# Patient Record
Sex: Male | Born: 1935
Health system: Southern US, Community
[De-identification: ages and names within clinical notes are randomized; demographics above are authoritative.]

## PROBLEM LIST (undated history)

## (undated) DIAGNOSIS — K317 Polyp of stomach and duodenum: Secondary | ICD-10-CM

## (undated) DIAGNOSIS — K5790 Diverticulosis of intestine, part unspecified, without perforation or abscess without bleeding: Secondary | ICD-10-CM

## (undated) DIAGNOSIS — H269 Unspecified cataract: Secondary | ICD-10-CM

## (undated) DIAGNOSIS — D509 Iron deficiency anemia, unspecified: Secondary | ICD-10-CM

## (undated) DIAGNOSIS — I10 Essential (primary) hypertension: Secondary | ICD-10-CM

## (undated) DIAGNOSIS — K649 Unspecified hemorrhoids: Secondary | ICD-10-CM

## (undated) DIAGNOSIS — I499 Cardiac arrhythmia, unspecified: Secondary | ICD-10-CM

## (undated) DIAGNOSIS — M1990 Unspecified osteoarthritis, unspecified site: Secondary | ICD-10-CM

## (undated) DIAGNOSIS — R600 Localized edema: Secondary | ICD-10-CM

## (undated) DIAGNOSIS — T8859XA Other complications of anesthesia, initial encounter: Secondary | ICD-10-CM

## (undated) DIAGNOSIS — K219 Gastro-esophageal reflux disease without esophagitis: Secondary | ICD-10-CM

## (undated) DIAGNOSIS — N400 Enlarged prostate without lower urinary tract symptoms: Secondary | ICD-10-CM

## (undated) DIAGNOSIS — E785 Hyperlipidemia, unspecified: Secondary | ICD-10-CM

## (undated) DIAGNOSIS — N200 Calculus of kidney: Secondary | ICD-10-CM

## (undated) DIAGNOSIS — Z972 Presence of dental prosthetic device (complete) (partial): Secondary | ICD-10-CM

## (undated) DIAGNOSIS — T4145XA Adverse effect of unspecified anesthetic, initial encounter: Secondary | ICD-10-CM

## (undated) DIAGNOSIS — Z87442 Personal history of urinary calculi: Secondary | ICD-10-CM

## (undated) DIAGNOSIS — Z973 Presence of spectacles and contact lenses: Secondary | ICD-10-CM

## (undated) DIAGNOSIS — K635 Polyp of colon: Secondary | ICD-10-CM

## (undated) DIAGNOSIS — R7303 Prediabetes: Secondary | ICD-10-CM

## (undated) HISTORY — DX: Polyp of colon: K63.5

## (undated) HISTORY — DX: Benign prostatic hyperplasia without lower urinary tract symptoms: N40.0

## (undated) HISTORY — PX: KIDNEY STONE SURGERY: SHX686

## (undated) HISTORY — PX: APPENDECTOMY: SHX54

## (undated) HISTORY — DX: Localized edema: R60.0

## (undated) HISTORY — PX: TONSILLECTOMY: SUR1361

## (undated) HISTORY — PX: CATARACT EXTRACTION: SUR2

## (undated) HISTORY — DX: Essential (primary) hypertension: I10

## (undated) HISTORY — DX: Hypercalcemia: E83.52

## (undated) HISTORY — DX: Hyperlipidemia, unspecified: E78.5

## (undated) HISTORY — DX: Unspecified cataract: H26.9

## (undated) HISTORY — PX: EYE SURGERY: SHX253

## (undated) HISTORY — PX: MULTIPLE TOOTH EXTRACTIONS: SHX2053

## (undated) HISTORY — DX: Calculus of kidney: N20.0

## (undated) HISTORY — DX: Iron deficiency anemia, unspecified: D50.9

## (undated) HISTORY — DX: Diverticulosis of intestine, part unspecified, without perforation or abscess without bleeding: K57.90

## (undated) HISTORY — DX: Gastro-esophageal reflux disease without esophagitis: K21.9

## (undated) HISTORY — DX: Unspecified hemorrhoids: K64.9

## (undated) HISTORY — DX: Polyp of stomach and duodenum: K31.7

---

## 2002-05-11 ENCOUNTER — Encounter: Admission: RE | Admit: 2002-05-11 | Discharge: 2002-05-11 | Payer: Self-pay | Admitting: Family Medicine

## 2002-05-11 ENCOUNTER — Encounter: Payer: Self-pay | Admitting: Family Medicine

## 2004-01-16 ENCOUNTER — Ambulatory Visit (HOSPITAL_COMMUNITY): Admission: RE | Admit: 2004-01-16 | Discharge: 2004-01-16 | Payer: Self-pay | Admitting: Urology

## 2004-01-16 ENCOUNTER — Ambulatory Visit (HOSPITAL_BASED_OUTPATIENT_CLINIC_OR_DEPARTMENT_OTHER): Admission: RE | Admit: 2004-01-16 | Discharge: 2004-01-16 | Payer: Self-pay | Admitting: Urology

## 2005-10-30 ENCOUNTER — Ambulatory Visit: Payer: Self-pay | Admitting: Family Medicine

## 2006-02-02 ENCOUNTER — Ambulatory Visit: Payer: Self-pay | Admitting: Family Medicine

## 2006-02-02 LAB — CONVERTED CEMR LAB
CO2: 30 meq/L (ref 19–32)
Chloride: 109 meq/L (ref 96–112)
Cholesterol: 252 mg/dL (ref 0–200)
Creatinine, Ser: 1.2 mg/dL (ref 0.4–1.5)
Glucose, Bld: 109 mg/dL — ABNORMAL HIGH (ref 70–99)
Sodium: 143 meq/L (ref 135–145)

## 2006-02-13 ENCOUNTER — Ambulatory Visit: Payer: Self-pay | Admitting: Family Medicine

## 2006-02-13 LAB — CONVERTED CEMR LAB: AST: 19 units/L (ref 0–37)

## 2006-03-04 ENCOUNTER — Ambulatory Visit: Payer: Self-pay | Admitting: Gastroenterology

## 2006-03-16 ENCOUNTER — Ambulatory Visit: Payer: Self-pay | Admitting: Gastroenterology

## 2006-03-16 ENCOUNTER — Encounter (INDEPENDENT_AMBULATORY_CARE_PROVIDER_SITE_OTHER): Payer: Self-pay | Admitting: Specialist

## 2006-05-05 ENCOUNTER — Ambulatory Visit: Payer: Self-pay | Admitting: Family Medicine

## 2006-05-05 LAB — CONVERTED CEMR LAB
AST: 19 units/L (ref 0–37)
Cholesterol: 170 mg/dL (ref 0–200)
Total CHOL/HDL Ratio: 4.2
Triglycerides: 129 mg/dL (ref 0–149)

## 2006-11-11 ENCOUNTER — Ambulatory Visit: Payer: Self-pay | Admitting: Family Medicine

## 2006-11-11 DIAGNOSIS — K219 Gastro-esophageal reflux disease without esophagitis: Secondary | ICD-10-CM

## 2006-11-11 DIAGNOSIS — E785 Hyperlipidemia, unspecified: Secondary | ICD-10-CM

## 2006-11-11 DIAGNOSIS — I10 Essential (primary) hypertension: Secondary | ICD-10-CM | POA: Insufficient documentation

## 2006-11-16 ENCOUNTER — Telehealth (INDEPENDENT_AMBULATORY_CARE_PROVIDER_SITE_OTHER): Payer: Self-pay | Admitting: *Deleted

## 2006-11-16 DIAGNOSIS — E213 Hyperparathyroidism, unspecified: Secondary | ICD-10-CM | POA: Insufficient documentation

## 2006-11-16 LAB — CONVERTED CEMR LAB
ALT: 23 units/L (ref 0–53)
Calcium: 10.7 mg/dL — ABNORMAL HIGH (ref 8.4–10.5)
Chloride: 109 meq/L (ref 96–112)
Creatinine, Ser: 1.2 mg/dL (ref 0.4–1.5)
Glucose, Bld: 102 mg/dL — ABNORMAL HIGH (ref 70–99)
Sodium: 141 meq/L (ref 135–145)

## 2007-02-09 ENCOUNTER — Ambulatory Visit: Payer: Self-pay | Admitting: Family Medicine

## 2007-02-15 ENCOUNTER — Telehealth (INDEPENDENT_AMBULATORY_CARE_PROVIDER_SITE_OTHER): Payer: Self-pay | Admitting: *Deleted

## 2007-02-15 ENCOUNTER — Encounter: Admission: RE | Admit: 2007-02-15 | Discharge: 2007-02-15 | Payer: Self-pay | Admitting: Family Medicine

## 2007-02-15 ENCOUNTER — Encounter (INDEPENDENT_AMBULATORY_CARE_PROVIDER_SITE_OTHER): Payer: Self-pay | Admitting: Family Medicine

## 2007-02-15 DIAGNOSIS — N200 Calculus of kidney: Secondary | ICD-10-CM | POA: Insufficient documentation

## 2007-02-15 LAB — CONVERTED CEMR LAB
ALT: 32 units/L (ref 0–53)
Cholesterol: 168 mg/dL (ref 0–200)
Creatinine, Ser: 1.3 mg/dL (ref 0.4–1.5)
Glucose, Bld: 119 mg/dL — ABNORMAL HIGH (ref 70–99)
HDL: 37.5 mg/dL — ABNORMAL LOW (ref >39.0)
PSA: 8.09 ng/mL — ABNORMAL HIGH (ref 0.10–4.00)
Potassium: 4.4 meq/L (ref 3.5–5.1)
Sodium: 139 meq/L (ref 135–145)
Triglycerides: 149 mg/dL (ref 0–149)
VLDL: 30 mg/dL (ref 0–40)

## 2007-02-16 ENCOUNTER — Telehealth (INDEPENDENT_AMBULATORY_CARE_PROVIDER_SITE_OTHER): Payer: Self-pay | Admitting: Family Medicine

## 2007-02-24 ENCOUNTER — Ambulatory Visit: Payer: Self-pay | Admitting: Family Medicine

## 2007-02-25 ENCOUNTER — Telehealth (INDEPENDENT_AMBULATORY_CARE_PROVIDER_SITE_OTHER): Payer: Self-pay | Admitting: *Deleted

## 2007-03-02 ENCOUNTER — Ambulatory Visit: Payer: Self-pay | Admitting: Family Medicine

## 2007-03-02 DIAGNOSIS — E119 Type 2 diabetes mellitus without complications: Secondary | ICD-10-CM

## 2007-03-03 ENCOUNTER — Encounter (INDEPENDENT_AMBULATORY_CARE_PROVIDER_SITE_OTHER): Payer: Self-pay | Admitting: *Deleted

## 2007-03-17 ENCOUNTER — Encounter: Admission: RE | Admit: 2007-03-17 | Discharge: 2007-03-17 | Payer: Self-pay | Admitting: Family Medicine

## 2007-04-02 ENCOUNTER — Encounter (INDEPENDENT_AMBULATORY_CARE_PROVIDER_SITE_OTHER): Payer: Self-pay | Admitting: Family Medicine

## 2007-04-20 ENCOUNTER — Encounter (INDEPENDENT_AMBULATORY_CARE_PROVIDER_SITE_OTHER): Payer: Self-pay | Admitting: Family Medicine

## 2007-04-24 ENCOUNTER — Encounter (INDEPENDENT_AMBULATORY_CARE_PROVIDER_SITE_OTHER): Payer: Self-pay | Admitting: Family Medicine

## 2007-04-30 ENCOUNTER — Encounter (INDEPENDENT_AMBULATORY_CARE_PROVIDER_SITE_OTHER): Payer: Self-pay | Admitting: Family Medicine

## 2007-05-18 ENCOUNTER — Encounter (INDEPENDENT_AMBULATORY_CARE_PROVIDER_SITE_OTHER): Payer: Self-pay | Admitting: Family Medicine

## 2007-05-25 ENCOUNTER — Ambulatory Visit: Payer: Self-pay | Admitting: Internal Medicine

## 2007-05-25 DIAGNOSIS — F528 Other sexual dysfunction not due to a substance or known physiological condition: Secondary | ICD-10-CM | POA: Insufficient documentation

## 2007-05-26 LAB — CONVERTED CEMR LAB
CO2: 31 meq/L (ref 19–32)
Calcium: 10.6 mg/dL — ABNORMAL HIGH (ref 8.4–10.5)
Chloride: 108 meq/L (ref 96–112)
Glucose, Bld: 111 mg/dL — ABNORMAL HIGH (ref 70–99)

## 2007-05-28 ENCOUNTER — Telehealth (INDEPENDENT_AMBULATORY_CARE_PROVIDER_SITE_OTHER): Payer: Self-pay | Admitting: *Deleted

## 2007-07-13 ENCOUNTER — Ambulatory Visit: Payer: Self-pay | Admitting: Internal Medicine

## 2007-07-14 ENCOUNTER — Telehealth: Payer: Self-pay | Admitting: Internal Medicine

## 2007-07-16 ENCOUNTER — Telehealth (INDEPENDENT_AMBULATORY_CARE_PROVIDER_SITE_OTHER): Payer: Self-pay | Admitting: *Deleted

## 2007-08-05 ENCOUNTER — Ambulatory Visit: Payer: Self-pay | Admitting: Internal Medicine

## 2007-08-06 ENCOUNTER — Encounter (INDEPENDENT_AMBULATORY_CARE_PROVIDER_SITE_OTHER): Payer: Self-pay | Admitting: *Deleted

## 2007-08-09 ENCOUNTER — Encounter (INDEPENDENT_AMBULATORY_CARE_PROVIDER_SITE_OTHER): Payer: Self-pay | Admitting: *Deleted

## 2007-08-09 ENCOUNTER — Telehealth (INDEPENDENT_AMBULATORY_CARE_PROVIDER_SITE_OTHER): Payer: Self-pay | Admitting: *Deleted

## 2007-08-09 LAB — CONVERTED CEMR LAB
BUN: 20 mg/dL (ref 6–23)
CO2: 30 meq/L (ref 19–32)
Chloride: 101 meq/L (ref 96–112)
GFR calc non Af Amer: 70 mL/min
Glucose, Bld: 101 mg/dL — ABNORMAL HIGH (ref 70–99)
Potassium: 4.6 meq/L (ref 3.5–5.1)

## 2007-09-07 ENCOUNTER — Encounter: Payer: Self-pay | Admitting: Internal Medicine

## 2007-09-13 ENCOUNTER — Encounter: Admission: RE | Admit: 2007-09-13 | Discharge: 2007-09-13 | Payer: Self-pay | Admitting: Surgery

## 2007-09-29 ENCOUNTER — Encounter: Admission: RE | Admit: 2007-09-29 | Discharge: 2007-09-29 | Payer: Self-pay | Admitting: Surgery

## 2007-10-07 ENCOUNTER — Encounter: Payer: Self-pay | Admitting: Internal Medicine

## 2007-10-18 ENCOUNTER — Encounter: Payer: Self-pay | Admitting: Internal Medicine

## 2007-11-08 ENCOUNTER — Telehealth (INDEPENDENT_AMBULATORY_CARE_PROVIDER_SITE_OTHER): Payer: Self-pay | Admitting: *Deleted

## 2007-11-30 ENCOUNTER — Ambulatory Visit: Payer: Self-pay | Admitting: Internal Medicine

## 2007-12-06 ENCOUNTER — Encounter (INDEPENDENT_AMBULATORY_CARE_PROVIDER_SITE_OTHER): Payer: Self-pay | Admitting: *Deleted

## 2007-12-06 LAB — CONVERTED CEMR LAB
Calcium: 10 mg/dL (ref 8.4–10.5)
GFR calc Af Amer: 85 mL/min
Glucose, Bld: 98 mg/dL (ref 70–99)
HDL: 38.4 mg/dL — ABNORMAL LOW (ref >39.0)
Hemoglobin: 17.9 g/dL — ABNORMAL HIGH (ref 13.0–17.0)
Sodium: 141 meq/L (ref 135–145)
VLDL: 27 mg/dL (ref 0–40)

## 2008-01-20 ENCOUNTER — Encounter: Payer: Self-pay | Admitting: Internal Medicine

## 2008-02-25 ENCOUNTER — Telehealth (INDEPENDENT_AMBULATORY_CARE_PROVIDER_SITE_OTHER): Payer: Self-pay | Admitting: *Deleted

## 2008-03-20 ENCOUNTER — Telehealth (INDEPENDENT_AMBULATORY_CARE_PROVIDER_SITE_OTHER): Payer: Self-pay | Admitting: *Deleted

## 2008-04-10 HISTORY — PX: PARATHYROIDECTOMY: SHX19

## 2008-04-12 ENCOUNTER — Ambulatory Visit: Payer: Self-pay | Admitting: Internal Medicine

## 2008-04-12 DIAGNOSIS — R972 Elevated prostate specific antigen [PSA]: Secondary | ICD-10-CM | POA: Insufficient documentation

## 2008-04-13 ENCOUNTER — Encounter: Payer: Self-pay | Admitting: Internal Medicine

## 2008-04-19 ENCOUNTER — Telehealth (INDEPENDENT_AMBULATORY_CARE_PROVIDER_SITE_OTHER): Payer: Self-pay | Admitting: *Deleted

## 2008-04-20 ENCOUNTER — Ambulatory Visit: Payer: Self-pay | Admitting: Internal Medicine

## 2008-04-20 ENCOUNTER — Encounter: Payer: Self-pay | Admitting: Internal Medicine

## 2008-05-24 ENCOUNTER — Telehealth: Payer: Self-pay | Admitting: Internal Medicine

## 2008-05-30 ENCOUNTER — Telehealth (INDEPENDENT_AMBULATORY_CARE_PROVIDER_SITE_OTHER): Payer: Self-pay | Admitting: *Deleted

## 2008-06-02 ENCOUNTER — Ambulatory Visit (HOSPITAL_COMMUNITY): Admission: RE | Admit: 2008-06-02 | Discharge: 2008-06-03 | Payer: Self-pay | Admitting: Surgery

## 2008-06-02 ENCOUNTER — Encounter (INDEPENDENT_AMBULATORY_CARE_PROVIDER_SITE_OTHER): Payer: Self-pay | Admitting: Surgery

## 2008-06-16 ENCOUNTER — Telehealth (INDEPENDENT_AMBULATORY_CARE_PROVIDER_SITE_OTHER): Payer: Self-pay | Admitting: *Deleted

## 2008-06-26 ENCOUNTER — Telehealth (INDEPENDENT_AMBULATORY_CARE_PROVIDER_SITE_OTHER): Payer: Self-pay | Admitting: *Deleted

## 2008-06-27 ENCOUNTER — Encounter: Payer: Self-pay | Admitting: Internal Medicine

## 2008-07-13 ENCOUNTER — Encounter: Payer: Self-pay | Admitting: Internal Medicine

## 2008-08-22 ENCOUNTER — Encounter: Payer: Self-pay | Admitting: Internal Medicine

## 2008-10-10 ENCOUNTER — Ambulatory Visit: Payer: Self-pay | Admitting: Internal Medicine

## 2008-10-10 LAB — CONVERTED CEMR LAB: Vit D, 25-Hydroxy: 30 ng/mL (ref 30–89)

## 2008-10-11 ENCOUNTER — Telehealth: Payer: Self-pay | Admitting: Internal Medicine

## 2008-10-13 ENCOUNTER — Encounter (INDEPENDENT_AMBULATORY_CARE_PROVIDER_SITE_OTHER): Payer: Self-pay | Admitting: *Deleted

## 2008-10-13 LAB — CONVERTED CEMR LAB
ALT: 20 units/L (ref 0–53)
TSH: 1.44 microintl units/mL (ref 0.35–5.50)

## 2008-11-22 ENCOUNTER — Telehealth (INDEPENDENT_AMBULATORY_CARE_PROVIDER_SITE_OTHER): Payer: Self-pay | Admitting: *Deleted

## 2009-01-10 ENCOUNTER — Ambulatory Visit: Payer: Self-pay | Admitting: Internal Medicine

## 2009-01-13 ENCOUNTER — Encounter: Payer: Self-pay | Admitting: Internal Medicine

## 2009-01-15 LAB — CONVERTED CEMR LAB
Calcium: 8.6 mg/dL (ref 8.4–10.5)
Cholesterol: 156 mg/dL (ref 0–200)
Creatinine, Ser: 1.1 mg/dL (ref 0.4–1.5)
GFR calc non Af Amer: 69.86 mL/min (ref >60)
Sodium: 142 meq/L (ref 135–145)
Total CHOL/HDL Ratio: 4
Triglycerides: 100 mg/dL (ref 0.0–149.0)

## 2009-05-10 ENCOUNTER — Ambulatory Visit: Payer: Self-pay | Admitting: Internal Medicine

## 2009-08-07 ENCOUNTER — Telehealth (INDEPENDENT_AMBULATORY_CARE_PROVIDER_SITE_OTHER): Payer: Self-pay | Admitting: *Deleted

## 2009-08-20 ENCOUNTER — Telehealth (INDEPENDENT_AMBULATORY_CARE_PROVIDER_SITE_OTHER): Payer: Self-pay | Admitting: *Deleted

## 2009-09-19 ENCOUNTER — Ambulatory Visit: Payer: Self-pay | Admitting: Internal Medicine

## 2009-09-21 LAB — CONVERTED CEMR LAB
ALT: 19 units/L (ref 0–53)
AST: 19 units/L (ref 0–37)
Basophils Absolute: 0 10*3/uL (ref 0.0–0.1)
Chloride: 106 meq/L (ref 96–112)
Cholesterol: 160 mg/dL (ref 0–200)
Creatinine, Ser: 1.1 mg/dL (ref 0.4–1.5)
Creatinine,U: 112.1 mg/dL
Eosinophils Absolute: 0.2 10*3/uL (ref 0.0–0.7)
GFR calc non Af Amer: 66.91 mL/min (ref >60)
HDL: 39.8 mg/dL (ref >39.00)
Hemoglobin: 17 g/dL (ref 13.0–17.0)
Lymphocytes Relative: 30 % (ref 12.0–46.0)
MCHC: 34 g/dL (ref 30.0–36.0)
Microalb Creat Ratio: 3.1 mg/g (ref 0.0–30.0)
Microalb, Ur: 3.5 mg/dL — ABNORMAL HIGH (ref 0.0–1.9)
Monocytes Relative: 6.4 % (ref 3.0–12.0)
Neutro Abs: 4.3 10*3/uL (ref 1.4–7.7)
Neutrophils Relative %: 60.8 % (ref 43.0–77.0)
Platelets: 182 10*3/uL (ref 150.0–400.0)
Potassium: 4.1 meq/L (ref 3.5–5.1)
RDW: 14.2 % (ref 11.5–14.6)
Triglycerides: 131 mg/dL (ref 0.0–149.0)
VLDL: 26.2 mg/dL (ref 0.0–40.0)

## 2009-10-03 ENCOUNTER — Encounter: Payer: Self-pay | Admitting: Internal Medicine

## 2009-10-23 ENCOUNTER — Encounter (INDEPENDENT_AMBULATORY_CARE_PROVIDER_SITE_OTHER): Payer: Self-pay | Admitting: *Deleted

## 2009-11-05 ENCOUNTER — Telehealth (INDEPENDENT_AMBULATORY_CARE_PROVIDER_SITE_OTHER): Payer: Self-pay | Admitting: *Deleted

## 2010-01-23 ENCOUNTER — Encounter: Payer: Self-pay | Admitting: Internal Medicine

## 2010-03-22 ENCOUNTER — Ambulatory Visit
Admission: RE | Admit: 2010-03-22 | Discharge: 2010-03-22 | Payer: Self-pay | Source: Home / Self Care | Attending: Internal Medicine | Admitting: Internal Medicine

## 2010-03-22 ENCOUNTER — Other Ambulatory Visit: Payer: Self-pay | Admitting: Internal Medicine

## 2010-03-22 LAB — ALT: ALT: 20 U/L (ref 0–53)

## 2010-03-22 LAB — BASIC METABOLIC PANEL
BUN: 26 mg/dL — ABNORMAL HIGH (ref 6–23)
CO2: 30 mEq/L (ref 19–32)
Calcium: 8.8 mg/dL (ref 8.4–10.5)
Chloride: 107 mEq/L (ref 96–112)
Creatinine, Ser: 1.2 mg/dL (ref 0.4–1.5)
GFR: 62.37 mL/min (ref >60.00)
Glucose, Bld: 111 mg/dL — ABNORMAL HIGH (ref 70–99)
Potassium: 4.5 mEq/L (ref 3.5–5.1)
Sodium: 142 mEq/L (ref 135–145)

## 2010-03-22 LAB — AST: AST: 20 U/L (ref 0–37)

## 2010-03-22 LAB — HEMOGLOBIN A1C: Hgb A1c MFr Bld: 6.3 % (ref 4.6–6.5)

## 2010-04-07 LAB — CONVERTED CEMR LAB
BUN: 25 mg/dL — ABNORMAL HIGH (ref 6–23)
Basophils Absolute: 0 10*3/uL (ref 0.0–0.1)
Basophils Relative: 0 % (ref 0.0–3.0)
CO2: 29 meq/L (ref 19–32)
Calcium: 10 mg/dL (ref 8.4–10.5)
Chloride: 113 meq/L — ABNORMAL HIGH (ref 96–112)
Creatinine, Ser: 1.2 mg/dL (ref 0.4–1.5)
Creatinine, Ser: 1.4 mg/dL (ref 0.4–1.5)
Creatinine,U: 246.8 mg/dL
Eosinophils Absolute: 0.1 10*3/uL (ref 0.0–0.7)
GFR calc Af Amer: 77 mL/min
GFR calc non Af Amer: 63 mL/min
Glucose, Bld: 119 mg/dL — ABNORMAL HIGH (ref 70–99)
Hemoglobin: 16.7 g/dL (ref 13.0–17.0)
MCHC: 33.5 g/dL (ref 30.0–36.0)
MCV: 91.5 fL (ref 78.0–100.0)
Monocytes Absolute: 0.4 10*3/uL (ref 0.1–1.0)
Neutro Abs: 3.6 10*3/uL (ref 1.4–7.7)
Neutrophils Relative %: 60.5 % (ref 43.0–77.0)
RBC: 5.44 M/uL (ref 4.22–5.81)

## 2010-04-09 ENCOUNTER — Encounter: Payer: Self-pay | Admitting: Internal Medicine

## 2010-04-09 NOTE — Assessment & Plan Note (Signed)
Summary: 4 MONTH FOLLOWUP///SPH   Vital Signs:  Patient profile:   76 year old male Height:      68.5 inches Weight:      205.6 pounds BMI:     30.92 Pulse rate:   60 / minute Pulse rhythm:   regular BP sitting:   132 / 80  (left arm) Cuff size:   large  Vitals Entered By: Shary Decamp (May 10, 2009 8:29 AM) CC: rov - fasting   History of Present Illness: ROV Hyperlipidemia-- good medication compliance    AODM -- life style unchanged , no CBGs     Allergies: 1)  ! Sulfa  Past History:  Past Medical History: GERD Hyperlipidemia Hypertension AODM  h/o CALCULUS OF KIDNEY s/p R  percutaneus nephrostomy @ [facility] Hx of HYPERCALCEMIA ----> Parathyroidectomy (04/2008) Benign prostatic hypertrophy, f/u @ [facility], s/p Bx 2009 aprox: (-)   Past Surgical History: Reviewed history from 10/10/2008 and no changes required. Appendectomy eye surgery Tonsillectomy Parathyroidectomy (04/2008)  Social History: Reviewed history from 10/10/2008 and no changes required. Retired Single, has  a girlfriend  3 kids  Former Smoker Alcohol use-yes Drug use-no Regular exercise-no  Review of Systems CV:  Denies chest pain or discomfort and shortness of breath with exertion. Resp:  Denies cough. GI:  GERD well controlled .  Physical Exam  General:  alert and well-developed.   Neck:  no masses, normal carotid upstroke, and no carotid bruits.   Lungs:  normal respiratory effort, no intercostal retractions, no accessory muscle use, and normal breath sounds.   Heart:  normal rate, regular rhythm, and no murmur.   Extremities:  no pretibial edema bilaterally      Impression & Recommendations:  Problem # 1:  DM (ICD-250.00) well-controlled on diet His updated medication list for this problem includes:    Benazepril Hcl 20 Mg Tabs (Benazepril hcl) .Marland Kitchen... 1 by mouth qd  Labs Reviewed: Creat: 1.1 (01/10/2009)    Reviewed HgBA1c results: 5.9 (01/10/2009)  5.8  (04/12/2008)  Problem # 2:  HYPERTENSION (ICD-401.9) at goal , no change  His updated medication list for this problem includes:    Toprol Xl 50 Mg Tb24 (Metoprolol succinate) .Marland Kitchen... 1 by mouth once daily    Procardia Xl 60 Mg Tb24 (Nifedipine) .Marland Kitchen... 1 by mouth once daily    Benazepril Hcl 20 Mg Tabs (Benazepril hcl) .Marland Kitchen... 1 by mouth qd  BP today: 132/80 Prior BP: 136/76 (01/10/2009)  Labs Reviewed: K+: 4.1 (01/10/2009) Creat: : 1.1 (01/10/2009)   Chol: 156 (01/10/2009)   HDL: 38.20 (01/10/2009)   LDL: 98 (01/10/2009)   TG: 100.0 (01/10/2009)  Problem # 3:  HYPERLIPIDEMIA (ICD-272.4) at goal  His updated medication list for this problem includes:    Zocor 40 Mg Tabs (Simvastatin) .Marland Kitchen... 1 by mouth once daily  Labs Reviewed: SGOT: 20 (10/10/2008)   SGPT: 20 (10/10/2008)   HDL:38.20 (01/10/2009), 38.4 (11/30/2007)  LDL:98 (01/10/2009), 98 (11/30/2007)  Chol:156 (01/10/2009), 164 (11/30/2007)  Trig:100.0 (01/10/2009), 136 (11/30/2007)  Complete Medication List: 1)  Gnp Saw Palmetto 160 Mg Caps (Saw palmetto (serenoa repens)) 2)  Prilosec 20 Mg Cpdr (Omeprazole) .Marland Kitchen.. 1 by mouth qd 3)  Toprol Xl 50 Mg Tb24 (Metoprolol succinate) .Marland Kitchen.. 1 by mouth once daily 4)  Procardia Xl 60 Mg Tb24 (Nifedipine) .Marland Kitchen.. 1 by mouth once daily 5)  Zocor 40 Mg Tabs (Simvastatin) .Marland Kitchen.. 1 by mouth once daily 6)  Benazepril Hcl 20 Mg Tabs (Benazepril hcl) .Marland Kitchen.. 1 by mouth qd  7)  Vitamin D 1000 Units  .... Once daily  Patient Instructions: 1)  Please schedule a follow-up appointment in 3 to 4  months .

## 2010-04-09 NOTE — Assessment & Plan Note (Signed)
Summary: 4 mth fu/kdc   Vital Signs:  Patient profile:   76 year old male Weight:      203.38 pounds Pulse rate:   72 / minute Pulse rhythm:   regular BP sitting:   126 / 82  (left arm) Cuff size:   large  Vitals Entered By: Army Fossa CMA (September 19, 2009 8:04 AM) CC: Pt here for 4 month f/u : Fasting.   History of Present Illness: routine office visit  Hyperlipidemia--good medication compliance, eats ad libitum, does not exercise and  think he won't change that  Hypertension--no ambulatory BPs  AODM --has not seen an eye doctor in 3 years   Benign prostatic hypertrophy, symptoms at baseline, saw urology May 2011  Allergies: 1)  ! Sulfa  Past History:  Past Medical History: Reviewed history from 05/10/2009 and no changes required. GERD Hyperlipidemia Hypertension AODM  h/o CALCULUS OF KIDNEY s/p R  percutaneus nephrostomy @ [facility] Hx of HYPERCALCEMIA ----> Parathyroidectomy (04/2008) Benign prostatic hypertrophy, f/u @ [facility], s/p Bx 2009 aprox: (-)   Past Surgical History: Reviewed history from 10/10/2008 and no changes required. Appendectomy eye surgery Tonsillectomy Parathyroidectomy (04/2008)  Family History: Reviewed history from 04/12/2008 and no changes required. MI-- F (age 43s), M colon ca--no prostate ca--no DM--no  Social History: Reviewed history from 10/10/2008 and no changes required. Retired Single, has  a girlfriend  3 kids  Former Smoker Alcohol use-yes Drug use-no Regular exercise-no  Review of Systems CV:  Denies chest pain or discomfort and swelling of feet. Resp:  Denies cough and shortness of breath. GI:  Denies bloody stools, nausea, and vomiting. GU:  denies dysuria, gross hematuria and as usual, his stream is weak which is essentially the only symptom he has as far as BPH. MS:  no lower extremity paresthesias Occasionally has pain at the proximal right heel. advil helps. Endo:   Marland Kitchen  Physical Exam  General:   alert, well-developed, and well-nourished.   Lungs:  normal respiratory effort, no intercostal retractions, no accessory muscle use, and normal breath sounds.   Heart:  normal rate, regular rhythm, and no murmur.   Pulses:  normal pedal pulses bilaterally Extremities:  no pretibial edema bilaterally     Diabetes Management Exam:    Foot Exam (with socks and/or shoes not present):       Sensory-Pinprick/Light touch:          Left medial foot (L-4): normal          Left dorsal foot (L-5): normal          Left lateral foot (S-1): normal          Right medial foot (L-4): normal          Right dorsal foot (L-5): normal          Right lateral foot (S-1): normal       Sensory-Monofilament:          Left foot: normal          Right foot: normal       Inspection:          Left foot: normal          Right foot: normal       Nails:          Left foot: normal          Right foot: normal   Impression & Recommendations:  Problem # 1:  HYPERLIPIDEMIA (ICD-272.4) due for labs His updated  medication list for this problem includes:    Zocor 40 Mg Tabs (Simvastatin) .Marland Kitchen... 1 by mouth once daily  Labs Reviewed: SGOT: 20 (10/10/2008)   SGPT: 20 (10/10/2008)   HDL:38.20 (01/10/2009), 38.4 (11/30/2007)  LDL:98 (01/10/2009), 98 (11/30/2007)  Chol:156 (01/10/2009), 164 (11/30/2007)  Trig:100.0 (01/10/2009), 136 (11/30/2007)  Orders: TLB-ALT (SGPT) (84460-ALT) TLB-AST (SGOT) (84450-SGOT) TLB-Lipid Panel (80061-LIPID)  Problem # 2:  DM (ICD-250.00) on no  medication does not follow any particular diet Does not exercise and thinks he won't do it in the future Nevertheless I counseled him about diet and exercise also recommend a yearly eye  checkup. He is due. His updated medication list for this problem includes:    Benazepril Hcl 20 Mg Tabs (Benazepril hcl) .Marland Kitchen... 1 by mouth qd    Labs Reviewed: Creat: 1.1 (01/10/2009)    Reviewed HgBA1c results: 5.9 (01/10/2009)  5.8  (04/12/2008)  Orders: TLB-A1C / Hgb A1C (Glycohemoglobin) (83036-A1C) TLB-Microalbumin/Creat Ratio, Urine (82043-MALB)  Problem # 3:  HYPERTENSION (ICD-401.9) at goal  His updated medication list for this problem includes:    Toprol Xl 50 Mg Tb24 (Metoprolol succinate) .Marland Kitchen... 1 by mouth once daily    Procardia Xl 60 Mg Tb24 (Nifedipine) .Marland Kitchen... 1 by mouth once daily    Benazepril Hcl 20 Mg Tabs (Benazepril hcl) .Marland Kitchen... 1 by mouth qd  BP today: 126/82 Prior BP: 132/80 (05/10/2009)  Labs Reviewed: K+: 4.1 (01/10/2009) Creat: : 1.1 (01/10/2009)   Chol: 156 (01/10/2009)   HDL: 38.20 (01/10/2009)   LDL: 98 (01/10/2009)   TG: 100.0 (01/10/2009)  Orders: Venipuncture (16109) TLB-BMP (Basic Metabolic Panel-BMET) (80048-METABOL) TLB-CBC Platelet - w/Differential (85025-CBCD)  Problem # 4:  PREVENTIVE HEALTH CARE (ICD-V70.0)   shingles shot today    Problem # 5:  BENIGN PROSTATIC HYPERTROPHY (ICD-600.00) recently seen by urology, no reports, patient will call them and ask for it   Complete Medication List: 1)  Gnp Saw Palmetto 160 Mg Caps (Saw palmetto (serenoa repens)) 2)  Prilosec 20 Mg Cpdr (Omeprazole) .Marland Kitchen.. 1 by mouth qd 3)  Toprol Xl 50 Mg Tb24 (Metoprolol succinate) .Marland Kitchen.. 1 by mouth once daily 4)  Procardia Xl 60 Mg Tb24 (Nifedipine) .Marland Kitchen.. 1 by mouth once daily 5)  Zocor 40 Mg Tabs (Simvastatin) .Marland Kitchen.. 1 by mouth once daily 6)  Benazepril Hcl 20 Mg Tabs (Benazepril hcl) .Marland Kitchen.. 1 by mouth qd 7)  Vitamin D 1000 Units  .... Once daily  Other Orders: Zoster (Shingles) Vaccine Live [phone]) Admin 1st Vaccine (09811)  Patient Instructions: 1)  Please schedule a follow-up appointment in 6 months .  2)  please call by November for your flu shot   Immunizations Administered:  Zostavax # 1:    Vaccine Type: Zostavax    Site: left deltoid    Mfr: Merck    Dose: 0.5 ml    Route: IM    Given by: Army Fossa CMA    Exp. Date: 10/03/2010    Lot #: [id number] was given Subcutaneously  not IM. Army Fossa CMA  September 19, 2009 8:46 AM

## 2010-04-09 NOTE — Miscellaneous (Signed)
   Clinical Lists Changes  Observations: Added new observation of EYE EXAM: Neg DM exam (10/23/2009 8:05)

## 2010-04-09 NOTE — Progress Notes (Signed)
Summary: refill   Phone Note Refill Request Message from:  Fax from Pharmacy on November 05, 2009 11:17 AM  Refills Requested: Medication #1:  ZOCOR 40 MG TABS 1 by mouth once daily [facility] - fax [phone]  Initial call taken by: Okey Regal [place],  November 05, 2009 11:18 AM    Prescriptions: ZOCOR 40 MG TABS (SIMVASTATIN) 1 by mouth once daily  #90 Tablet x 1   Entered by:   Army Fossa CMA   Authorized by:   Nolon Rod. Paz MD   Signed by:   Army Fossa CMA on 11/05/2009   Method used:   Electronically to        [facility]* (retail)             ,          Ph: [phone]       Fax: [phone]   RxID:   [id number]

## 2010-04-09 NOTE — Progress Notes (Signed)
Summary: Refill Request  Phone Note Refill Request Call back at [phone] Message from:  Pharmacy on Aug 07, 2009 9:25 AM  Refills Requested: Medication #1:  ZOCOR 40 MG TABS 1 by mouth once daily   Dosage confirmed as above?Dosage Confirmed   Brand Name Necessary? No   Supply Requested: 3 months [facility]  Next Appointment Scheduled: 7.13.11 Initial call taken by: Harold Barban,  Aug 07, 2009 9:25 AM    Prescriptions: ZOCOR 40 MG TABS (SIMVASTATIN) 1 by mouth once daily  #90 Tablet x 0   Entered by:   Shary Decamp   Authorized by:   Nolon Rod. Paz MD   Signed by:   Shary Decamp on 08/07/2009   Method used:   Electronically to        [facility]* (mail-order)             ,          Ph: [phone]       Fax: [phone]   RxID:   [id number]

## 2010-04-09 NOTE — Letter (Signed)
Summary: ney DM eye exam---- [facility]   Imported By: Lanelle Bal 10/18/2009 11:23:08  _____________________________________________________________________  External Attachment:    Type:   Image     Comment:   External Document  Appended Document: ney DM eye exam---- [facility] please enter in EMR

## 2010-04-09 NOTE — Letter (Signed)
Summary: [facility]  [facility]   Imported By: Lanelle Bal 02/04/2010 13:58:44  _____________________________________________________________________  External Attachment:    Type:   Image     Comment:   External Document

## 2010-04-09 NOTE — Progress Notes (Signed)
Summary: Refill Request  Phone Note Refill Request Call back at [phone] Message from:  Pharmacy on August 20, 2009 9:39 AM  Refills Requested: Medication #1:  BENAZEPRIL HCL 20 MG  TABS 1 by mouth qd   Dosage confirmed as above?Dosage Confirmed   Supply Requested: 3 months [facility]  Next Appointment Scheduled: July 13th Initial call taken by: Harold Barban,  August 20, 2009 9:40 AM    Prescriptions: BENAZEPRIL HCL 20 MG  TABS (BENAZEPRIL HCL) 1 by mouth qd  #90 x 0   Entered by:   Jeremy Johann CMA   Authorized by:   Nolon Rod. Paz MD   Signed by:   Jeremy Johann CMA on 08/20/2009   Method used:   Faxed to ...       [facility]* (retail)             ,          Ph: [phone]       Fax: [phone]   RxID:   [id number]

## 2010-04-11 NOTE — Assessment & Plan Note (Signed)
Summary: rto 6 months/cbs   Vital Signs:  Patient profile:   76 year old male Height:      68.5 inches Weight:      208.25 pounds BMI:     31.32 Pulse rate:   82 / minute Pulse rhythm:   regular BP sitting:   136 / 86  (left arm) Cuff size:   large  Vitals Entered By: Army Fossa CMA (March 22, 2010 8:26 AM) CC: 6 month f/u- fasting  Comments discuss simvastatin [facility]   History of Present Illness: ROV doing  well needs a  urologist in town, previously he saw  Dr. Earlene Plater @ [facility]  ROS AODM -- had  neg eye exam   Denies chest pain, shortness of breath No nausea, vomiting, diarrhea  Current Medications (verified): 1)  Gnp Saw Palmetto 160 Mg  Caps (Saw Palmetto (Serenoa Repens)) 2)  Prilosec 20 Mg  Cpdr (Omeprazole) .Marland Kitchen.. 1 By Mouth Qd 3)  Toprol Xl 50 Mg  Tb24 (Metoprolol Succinate) .Marland Kitchen.. 1 By Mouth Once Daily 4)  Procardia Xl 60 Mg  Tb24 (Nifedipine) .Marland Kitchen.. 1 By Mouth Once Daily 5)  Zocor 40 Mg Tabs (Simvastatin) .Marland Kitchen.. 1 By Mouth Once Daily 6)  Benazepril Hcl 20 Mg  Tabs (Benazepril Hcl) .Marland Kitchen.. 1 By Mouth Qd 7)  Vitamin D 1000 Units .... Once Daily  Allergies (verified): 1)  ! Sulfa  Past History:  Past Medical History: Reviewed history from 05/10/2009 and no changes required. GERD Hyperlipidemia Hypertension AODM  h/o CALCULUS OF KIDNEY s/p R  percutaneus nephrostomy @ [facility] Hx of HYPERCALCEMIA ----> Parathyroidectomy (04/2008) Benign prostatic hypertrophy, f/u @ [facility], s/p Bx 2009 aprox: (-)   Past Surgical History: Reviewed history from 10/10/2008 and no changes required. Appendectomy eye surgery Tonsillectomy Parathyroidectomy (04/2008)  Social History: Reviewed history from 10/10/2008 and no changes required. Retired Single, has  a girlfriend  3 kids  Former Smoker Alcohol use-yes Drug use-no Regular exercise-no  Physical Exam  General:  alert, well-developed, and well-nourished.   Lungs:  normal respiratory effort, no intercostal  retractions, no accessory muscle use, and normal breath sounds.   Heart:  normal rate, regular rhythm, and no murmur.   Extremities:  no pretibial edema bilaterally      Impression & Recommendations:  Problem # 1:  BENIGN PROSTATIC HYPERTROPHY (ICD-600.00) history of BPH, history of renal calculi. Would like a local urologist ---- referral arranged  Orders: Urology Referral (Urology)  Problem # 2:  DM (ICD-250.00) on diet only, labs  His updated medication list for this problem includes:    Benazepril Hcl 20 Mg Tabs (Benazepril hcl) .Marland Kitchen... 1 by mouth qd  Orders: TLB-A1C / Hgb A1C (Glycohemoglobin) (83036-A1C) Specimen Handling (60454)  Labs Reviewed: Creat: 1.1 (09/19/2009)    Reviewed HgBA1c results: 6.1 (09/19/2009)  5.9 (01/10/2009)  Problem # 3:  HYPERTENSION (ICD-401.9) no change for now. His updated medication list for this problem includes:    Toprol Xl 50 Mg Tb24 (Metoprolol succinate) .Marland Kitchen... 1 by mouth once daily    Procardia Xl 60 Mg Tb24 (Nifedipine) .Marland Kitchen... 1 by mouth once daily    Benazepril Hcl 20 Mg Tabs (Benazepril hcl) .Marland Kitchen... 1 by mouth qd  Orders: Venipuncture (09811) TLB-BMP (Basic Metabolic Panel-BMET) (80048-METABOL) TLB-ALT (SGPT) (84460-ALT) TLB-AST (SGOT) (84450-SGOT) Specimen Handling (91478)  BP today: 136/86 Prior BP: 126/82 (09/19/2009)  Labs Reviewed: K+: 4.1 (09/19/2009) Creat: : 1.1 (09/19/2009)   Chol: 160 (09/19/2009)   HDL: 39.80 (09/19/2009)   LDL: 94 (09/19/2009)  TG: 131.0 (09/19/2009)  Problem # 4:  HYPERLIPIDEMIA (ICD-272.4)  His updated medication list for this problem includes:    Zocor 40 Mg Tabs (Simvastatin) .Marland Kitchen... 1 by mouth once daily  Labs Reviewed: SGOT: 19 (09/19/2009)   SGPT: 19 (09/19/2009)   HDL:39.80 (09/19/2009), 38.20 (01/10/2009)  LDL:94 (09/19/2009), 98 (01/10/2009)  Chol:160 (09/19/2009), 156 (01/10/2009)  Trig:131.0 (09/19/2009), 100.0 (01/10/2009)  Complete Medication List: 1)  Gnp Saw Palmetto 160 Mg  Caps (Saw palmetto (serenoa repens)) 2)  Prilosec 20 Mg Cpdr (Omeprazole) .Marland Kitchen.. 1 by mouth qd 3)  Toprol Xl 50 Mg Tb24 (Metoprolol succinate) .Marland Kitchen.. 1 by mouth once daily 4)  Procardia Xl 60 Mg Tb24 (Nifedipine) .Marland Kitchen.. 1 by mouth once daily 5)  Zocor 40 Mg Tabs (Simvastatin) .Marland Kitchen.. 1 by mouth once daily 6)  Benazepril Hcl 20 Mg Tabs (Benazepril hcl) .Marland Kitchen.. 1 by mouth qd 7)  Vitamin D 1000 Units  .... Once daily  Patient Instructions: 1)  Please schedule a follow-up appointment in 6 months, fasting , physical exam    Orders Added: 1)  Venipuncture [36415] 2)  TLB-A1C / Hgb A1C (Glycohemoglobin) [83036-A1C] 3)  TLB-BMP (Basic Metabolic Panel-BMET) [80048-METABOL] 4)  TLB-ALT (SGPT) [84460-ALT] 5)  TLB-AST (SGOT) [84450-SGOT] 6)  Specimen Handling [99000] 7)  Urology Referral [Urology] 8)  Est. Patient Level III [16109]   Immunization History:  Influenza Immunization History:    Influenza:  benefits explained, declined (03/22/2010)  Pneumovax Immunization History:    Pneumovax:  benefits explained, declined  (03/22/2010)   Immunization History:  Influenza Immunization History:    Influenza:  benefits explained, declined (03/22/2010)  Pneumovax Immunization History:    Pneumovax:  benefits explained, declined  (03/22/2010)

## 2010-05-01 NOTE — Consult Note (Signed)
Summary: [facility]   Imported By: Maryln Gottron 04/16/2010 15:22:55  _____________________________________________________________________  External Attachment:    Type:   Image     Comment:   External Document

## 2010-06-20 LAB — COMPREHENSIVE METABOLIC PANEL
AST: 24 U/L (ref 0–37)
Albumin: 4 g/dL (ref 3.5–5.2)
BUN: 23 mg/dL (ref 6–23)
Calcium: 10.8 mg/dL — ABNORMAL HIGH (ref 8.4–10.5)
Chloride: 108 mEq/L (ref 96–112)
Creatinine, Ser: 1.14 mg/dL (ref 0.4–1.5)
GFR calc Af Amer: >60 mL/min (ref >60)
Total Bilirubin: 0.8 mg/dL (ref 0.3–1.2)

## 2010-06-20 LAB — GLUCOSE, CAPILLARY: Glucose-Capillary: 119 mg/dL — ABNORMAL HIGH (ref 70–99)

## 2010-06-20 LAB — DIFFERENTIAL
Basophils Absolute: 0 10*3/uL (ref 0.0–0.1)
Eosinophils Relative: 2 % (ref 0–5)
Lymphocytes Relative: 35 % (ref 12–46)
Lymphs Abs: 2.2 10*3/uL (ref 0.7–4.0)
Monocytes Absolute: 0.5 10*3/uL (ref 0.1–1.0)
Neutro Abs: 3.5 10*3/uL (ref 1.7–7.7)

## 2010-06-20 LAB — CALCIUM: Calcium: 9.2 mg/dL (ref 8.4–10.5)

## 2010-06-20 LAB — CBC
HCT: 52.4 % — ABNORMAL HIGH (ref 39.0–52.0)
MCHC: 34 g/dL (ref 30.0–36.0)
MCV: 91.4 fL (ref 78.0–100.0)
Platelets: 161 10*3/uL (ref 150–400)
RDW: 13.1 % (ref 11.5–15.5)

## 2010-06-20 LAB — URINALYSIS, ROUTINE W REFLEX MICROSCOPIC
Glucose, UA: NEGATIVE mg/dL
Protein, ur: NEGATIVE mg/dL
Specific Gravity, Urine: 1.018 (ref 1.005–1.030)
Urobilinogen, UA: 1 mg/dL (ref 0.0–1.0)

## 2010-06-20 LAB — PROTIME-INR: Prothrombin Time: 13.8 seconds (ref 11.6–15.2)

## 2010-07-21 ENCOUNTER — Other Ambulatory Visit: Payer: Self-pay | Admitting: Internal Medicine

## 2010-07-23 NOTE — Op Note (Signed)
NAME:  Ronald [name], Ronald [name] NO.:  [id number]   MEDICAL RECORD NO.:  [id number]          PATIENT TYPE:  AMB   LOCATION:  SDS                          FACILITY:  [facility]   PHYSICIAN:  Velora Heckler, MD      DATE OF BIRTH:  [birth year]   DATE OF PROCEDURE:  06/02/2008  DATE OF DISCHARGE:                               OPERATIVE REPORT   PREOPERATIVE DIAGNOSIS:  Primary hyperparathyroidism.   POSTOPERATIVE DIAGNOSIS:  Primary hyperparathyroidism.   PROCEDURE:  Neck exploration with left inferior parathyroidectomy.   SURGEON:  Velora Heckler, MD, FACS   ANESTHESIA:  General per Dr. Arta Bruce.   ESTIMATED BLOOD LOSS:  Minimal preparation Betadine.   COMPLICATIONS:  None.   INDICATIONS:  The patient is a 76 year old white male initially  evaluated in the summer of 2009.  He had biochemical evidence of primary  hyperparathyroidism with an elevated parathyroid hormone level of 135.5.  Sestamibi scan failed to reveal a parathyroid adenoma.  MRI scan  suggested a 12-mm enhancing lesion in the right inferior position  consistent with parathyroid adenoma.  The patient now comes to surgery  for exploration.   BODY OF REPORT:  Procedure was done in OR #17 at the [facility].  The patient was brought to the operating room and  placed in supine position on the operating room table.  Following  administration of general anesthesia, the patient was prepped and draped  in the usual strict aseptic fashion.  After ascertaining that an  adequate level of anesthesia had been achieved, a inferior right neck  incision was made with a #15 blade.  Dissection was carried down through  subcutaneous tissues and platysma.  Skin flaps were developed  circumferentially and a Weitlaner retractor placed for exposure.  Strap  muscles were incised in the midline.  Strap muscles were reflected to  the right.  The inferior pole of the right thyroid lobe was exposed.  Dissection  in the region just beneath the right lobe of the thyroid was  revealing of a normal-sized parathyroid gland.  It has a slight nodular  density.  It was mobilized and its vascular pedicle divided.  It was  excised and placed in saline.  Further exploration along the  tracheoesophageal groove fails to reveal any abnormally enlarged  parathyroid tissue.  However, there was adipose tissue that appears  somewhat suspect in color.  This appears to be in the thyrothymic tract.  It was excised.  It was submitted to pathology and Dr. Charlott Rakes  confirms parathyroid tissue intermixed with significant amount of  adipose tissue.  Subsequently, the previously excised parathyroid gland  was submitted.  Dr. Debby Bud confirms parathyroid tissue but does not feel  like this represents an adenoma.  Incision was slightly extended and the  right superior neck was explored.  No evidence of parathyroid adenoma  was identified.   At this point, a decision was made to proceed with 4 gland exploration.  Therefore, the incision was extended across the midline and subplatysmal  flaps were developed from  the thyroid notch to the sternal notch.  A  Mahorner self-retaining retractor was placed for exposure.  Strap  muscles were incised widely in the midline.  Further dissection in the  right neck fails to reveal parathyroid adenoma.  No further parathyroid  tissue was identified on the right.   We turned our attention to the left thyroid lobe.  This was gently  mobilized.  Middle thyroid vein was divided between Ligaclips.  Exploration reveals a normal superior parathyroid gland just above the  tubercle of Zuckerkandl.  This was left in situ.  Further dissection in  the inferior position at the beginning of the thyrothymic tract  identifies an enlarged parathyroid gland consistent with parathyroid  adenoma.  It measures approximately 1.2 cm in dimension.  It was  mobilized and vascular pedicle divided between  Ligaclips.  It was  excised in its entirety and submitted to pathology.  Dr. Charlott Rakes  confirms on frozen section parathyroid tissue consistent with  parathyroid adenoma.   Good hemostasis was obtained bilaterally.  Surgicel was placed in the  operative field bilaterally.  Strap muscles were reapproximated midline  with interrupted 3-0 Vicryl sutures.  Platysma was closed with  interrupted 3-0 Vicryl sutures.  Skin was closed with a running 4-0  Monocryl subcuticular suture.  Wound was washed and dried and Steri-  Strips were applied.  Sterile dressings were applied.  The patient was  awakened from anesthesia and brought to the recovery room in stable  condition.  The patient tolerated the procedure well.      Velora Heckler, MD  Electronically Signed     TMG/MEDQ  D:  06/02/2008  T:  06/02/2008  Job:  [id number]   cc:   Willow Ora, MD

## 2010-07-26 NOTE — Op Note (Signed)
NAME:  Ronald [name], Ronald [name] NO.:  [id number]   MEDICAL RECORD NO.:  [id number]          PATIENT TYPE:  AMB   LOCATION:  [facility]                         FACILITY:  [facility]   PHYSICIAN:  [place] Service, M.D.DATE OF BIRTH:  11/30/1934   DATE OF PROCEDURE:  01/16/2004  DATE OF DISCHARGE:                                 OPERATIVE REPORT   References made to office notes from January 09, 2004, December 04, 2003,  November 30, 2003, October 30, 2003, as well as CT scan from December 04, 2003.   PREOPERATIVE DIAGNOSIS:  Persistent hematuria, question of a 6-mm calculus,  left distal ureter. 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   PROCEDURE:  Cystoscopy, retrograde, right and left ureteroscopy.   ANESTHESIA:  General.   DRAINS:  None.   COMPLICATIONS:  None.   DESCRIPTION OF PROCEDURE:  The patient was prepped and draped in the dorsal  lithotomy position after institution of adequate level of general  anesthesia. Well lubricated 21-French [place] endoscope was gently inserted at  the urethral meatus. Normal urethra and sphincter. Elongated hyperemic  prostatic mucosa with multiple submucosa calculi. Bladder showed mild  distortion of the trigone consistent with an enlarged prostate. Bladder was  mildly trabeculated. Careful inspection with the 12 degree and 70 degree  lenses showed no other obvious evidence of intravesical pathology. Right and  left retrogrades were performed. Some difficulty was encountered in  negotiating the J deformity of the distal ureter. Floppy tip guide wire was  helpful in both cases. Retrogrades were performed due to use of Floppy tip  guide wire. There were some air bubbles with both retrograde films,  difficult to be certain whether small ureteral calculus was present on  either side. Proximal retrogrades, however, showed no evidence of filling  defect or obstruction. Guide wire was inserted at  the right ureteral  orifice, coiled nicely in the upper pole calyces. Short 6-French  ureteroscope was inserted along side the guide wire. Distal ureter inspected  to the limit of the short 6-French scope and then withdrawn. Similar  technique was used on the left side. No evidence of stone,  filling defect, or anatomic deformity was identified. Bladder was drained. A  20-French Foley was inserted to be left in for about one hour and then  removed when patient is fully alert and awake. The patient was given a B&O  suppository and returned to recovery in satisfactory condition.      RH/MEDQ  D:  01/16/2004  T:  01/16/2004  Job:  [id number]   cc:   Leanne Chang, M.D.  [address]  [place]  [place] [address]  Fax: [phone]

## 2010-08-17 ENCOUNTER — Other Ambulatory Visit: Payer: Self-pay | Admitting: Internal Medicine

## 2010-09-13 ENCOUNTER — Other Ambulatory Visit: Payer: Self-pay | Admitting: Internal Medicine

## 2010-09-23 ENCOUNTER — Ambulatory Visit (INDEPENDENT_AMBULATORY_CARE_PROVIDER_SITE_OTHER): Payer: Medicare Other | Admitting: Internal Medicine

## 2010-09-23 ENCOUNTER — Encounter: Payer: Self-pay | Admitting: Internal Medicine

## 2010-09-23 DIAGNOSIS — E785 Hyperlipidemia, unspecified: Secondary | ICD-10-CM

## 2010-09-23 DIAGNOSIS — Z0000 Encounter for general adult medical examination without abnormal findings: Secondary | ICD-10-CM

## 2010-09-23 DIAGNOSIS — R600 Localized edema: Secondary | ICD-10-CM

## 2010-09-23 DIAGNOSIS — I10 Essential (primary) hypertension: Secondary | ICD-10-CM

## 2010-09-23 DIAGNOSIS — R609 Edema, unspecified: Secondary | ICD-10-CM

## 2010-09-23 DIAGNOSIS — E119 Type 2 diabetes mellitus without complications: Secondary | ICD-10-CM

## 2010-09-23 LAB — CBC WITH DIFFERENTIAL/PLATELET
Basophils Absolute: 0 10*3/uL (ref 0.0–0.1)
Eosinophils Absolute: 0.2 10*3/uL (ref 0.0–0.7)
Lymphocytes Relative: 29.2 % (ref 12.0–46.0)
MCHC: 33.3 g/dL (ref 30.0–36.0)
Neutrophils Relative %: 60.6 % (ref 43.0–77.0)
Platelets: 215 10*3/uL (ref 150.0–400.0)
RDW: 15.1 % — ABNORMAL HIGH (ref 11.5–14.6)

## 2010-09-23 LAB — BASIC METABOLIC PANEL
BUN: 23 mg/dL (ref 6–23)
GFR: 55.37 mL/min — ABNORMAL LOW (ref >60.00)
Potassium: 4.1 mEq/L (ref 3.5–5.1)
Sodium: 136 mEq/L (ref 135–145)

## 2010-09-23 LAB — HEPATIC FUNCTION PANEL
AST: 20 U/L (ref 0–37)
Alkaline Phosphatase: 65 U/L (ref 39–117)
Bilirubin, Direct: 0.1 mg/dL (ref 0.0–0.3)
Total Bilirubin: 0.8 mg/dL (ref 0.3–1.2)

## 2010-09-23 LAB — HEMOGLOBIN A1C: Hgb A1c MFr Bld: 6.4 % (ref 4.6–6.5)

## 2010-09-23 LAB — LIPID PANEL
Cholesterol: 153 mg/dL (ref 0–200)
LDL Cholesterol: 87 mg/dL (ref 0–99)
Total CHOL/HDL Ratio: 4
Triglycerides: 124 mg/dL (ref 0.0–149.0)
VLDL: 24.8 mg/dL (ref 0.0–40.0)

## 2010-09-23 NOTE — Assessment & Plan Note (Addendum)
TD 2007 Pneumonia shot-- never had one, declined it today, benefits explained zostavax-- had already  PSA-- now f/u by urology, saw Dr Patsi Sears [phone]  Cscope per chart review was 03-2006, Bx adenomatous polyp (Dr Jarold Motto), next per GI Continue with his healthy diet, exercise more frequently

## 2010-09-23 NOTE — Assessment & Plan Note (Addendum)
Well control with present medicines, EKG today showed sinus bradycardia with a rate of 54. No acute changes. See history of present illness, patient's GF concerned about " dyspnea on exertion". The patient is extremely inactive and walks very seldom, he does not have chest pain, exertional cough or wheezing. Subjectively, he does not feel SOB. Deconditioning? I recommend to embrace regular exercise program. Call if problems.

## 2010-09-23 NOTE — Assessment & Plan Note (Signed)
On Zocor, due for labs

## 2010-09-23 NOTE — Progress Notes (Signed)
Subjective:    Patient ID: Ronald [name], male    DOB: 08-Feb-1935, 76 y.o.   MRN: [id number]  HPI  Here for Medicare AWV:  1. Risk factors based on Past M, S, F history: reviewed 2. Physical Activities:  raely does some physical activity 3. Depression/mood:  No problems noted or reported 4. Hearing: No problems noted or reported  5. ADL's:  Lives by himself has a GF, totally independent  6. Fall Risk: no recent falls  7. home Safety: does feel safe at home  8. Height, weight, &visual acuity: see VS, uses glasses , no problems  9. Counseling: provided 10. Labs ordered based on risk factors: if needed  11. Referral Coordination: if needed 12.  Care Plan, see assessment and plan  13.   Cognitive Assessment:motor and cognition skills normal  In addition, today we discussed the following: HTN-- on toprol x too long? Has been on it x years, his girlfriend who is a retired Engineer, civil (consulting) is concerned, is the cough and difficulty breathing d/t BB? See below. DOE--the patient is not active, from time to time his girlfriend  "convince him" to take a walk, he feels fine but her observation is that he breathes hard. During these episodes he does not cough or wheeze DM--eats healthy, no ambulatory blood sugars.  Has not the edema on the left leg recently, the edema is persisting, not worse at the end of the day. BPH-- saw his new urologist December 2011 Pain in the left great toe for more than 20 years, lately slightly worse?  Past Medical History  Diagnosis Date  . GERD (gastroesophageal reflux disease)   . Hyperlipidemia   . Hypertension   . Diabetes mellitus   . Calculus of kidney     s/p R percutaneus nephrostomy @ [facility]  . Hypercalcemia     parathyroidectomy 04/2008  . BPH (benign prostatic hypertrophy)     f/u @ [facility], s/p bx 2009 apro (-)   Past Surgical History  Procedure Date  . Appendectomy   . Eye surgery   . Tonsillectomy   . Parathyroidectomy 04/2008     Family  History:    MI-- F (age 72s), M    colon ca--no    prostate ca--no    DM--no  Social History:       Retired       Single, girlfriend lives 3 miles months away              3 children       Former Smoker       Alcohol use-yes       Drug use-no       Regular exercise-no  Review of Systems No chest pain or shortness of breath No cough or wheezing. No nausea, vomiting, diarrhea or blood in the stools. He has a chronic "weak stream" urinating, no difficulty urinating or gross hematuria.     Objective:   Physical Exam  Constitutional: He is oriented to person, place, and time. He appears well-developed and well-nourished.  HENT:  Head: Normocephalic and atraumatic.  Neck: No JVD present. No thyromegaly present.  Cardiovascular: Normal rate, regular rhythm and normal heart sounds.   No murmur heard. Pulmonary/Chest: Effort normal and breath sounds normal. No respiratory distress. He has no wheezes. He has no rales.  Abdominal: Soft. Bowel sounds are normal. He exhibits no distension. There is no tenderness. There is no rebound and no guarding.  Musculoskeletal:  Lower extremities weak extensive varicosities both large and small, +/+++ edema, symmetric, the left calf is indeed three quarters of an inch larger, nontender. DIABETIC FEET EXAM: Normal pedal pulses bilaterally Skin and nails are normal without calluses Pinprick examination of the feet normal.   Neurological: He is alert and oriented to person, place, and time.  Skin: Skin is warm and dry.  Psychiatric: He has a normal mood and affect. His behavior is normal. Judgment and thought content normal.          Assessment & Plan:

## 2010-09-23 NOTE — Assessment & Plan Note (Signed)
LLE edema noted for a while , sx  confirmed by physical exam. Most likely related to varicose veins. The be sure , we'll do ultrasound to rule out DVT although that is unlikely

## 2010-09-23 NOTE — Assessment & Plan Note (Signed)
Diet control, no ambulatory CBGs. Due for a A1c

## 2010-09-24 ENCOUNTER — Telehealth: Payer: Self-pay | Admitting: *Deleted

## 2010-09-24 ENCOUNTER — Other Ambulatory Visit: Payer: Self-pay | Admitting: Internal Medicine

## 2010-09-24 ENCOUNTER — Other Ambulatory Visit: Payer: Self-pay | Admitting: *Deleted

## 2010-09-24 ENCOUNTER — Ambulatory Visit (INDEPENDENT_AMBULATORY_CARE_PROVIDER_SITE_OTHER): Payer: Medicare Other | Admitting: *Deleted

## 2010-09-24 DIAGNOSIS — R609 Edema, unspecified: Secondary | ICD-10-CM

## 2010-09-24 NOTE — Telephone Encounter (Signed)
Message copied by Leanne Lovely on Tue Sep 24, 2010  8:46 AM ------      Message from: Ronald [name]      Created: Mon Sep 23, 2010  4:55 PM       Please schedule left lower extremity Doppler ultrasound, DX edema, rule out DVT.       I forgot to tell the patient he needs this test, let him know

## 2010-09-24 NOTE — Telephone Encounter (Signed)
Pt is aware.  

## 2010-09-26 ENCOUNTER — Encounter: Payer: Self-pay | Admitting: Internal Medicine

## 2010-09-26 ENCOUNTER — Telehealth: Payer: Self-pay | Admitting: *Deleted

## 2010-09-26 DIAGNOSIS — N289 Disorder of kidney and ureter, unspecified: Secondary | ICD-10-CM

## 2010-09-26 NOTE — Telephone Encounter (Signed)
Message copied by Leanne Lovely on Thu Sep 26, 2010 11:53 AM ------      Message from: Ronald [name]      Created: Thu Sep 26, 2010 11:27 AM       Advise patient:      Cholesterol and diabetes well controlled, continue with same medicines.      Kidney function is slightly decreased, he has a history of BPH, please schedule a renal ultrasound to be sure there is no urinary obstruction.

## 2010-09-26 NOTE — Telephone Encounter (Signed)
Pt is aware.  

## 2010-09-30 ENCOUNTER — Ambulatory Visit
Admission: RE | Admit: 2010-09-30 | Discharge: 2010-09-30 | Disposition: A | Discharge status: Home / Self Care | Payer: Medicare Other | Source: Ambulatory Visit | Attending: Internal Medicine | Admitting: Internal Medicine

## 2010-09-30 DIAGNOSIS — N289 Disorder of kidney and ureter, unspecified: Secondary | ICD-10-CM

## 2010-11-17 ENCOUNTER — Other Ambulatory Visit: Payer: Self-pay | Admitting: Internal Medicine

## 2010-12-04 ENCOUNTER — Telehealth: Payer: Self-pay | Admitting: Internal Medicine

## 2010-12-04 NOTE — Telephone Encounter (Signed)
Ask  patient what medication he needs refill for 10 days, go ahead and refill it, maybe because he is waiting a 90 day supply. Samples ok if available

## 2010-12-04 NOTE — Telephone Encounter (Signed)
Please see note from [facility]; unsure of need for quantity requested?

## 2010-12-05 MED ORDER — BENAZEPRIL HCL 20 MG PO TABS: 20.0000 mg | ORAL_TABLET | Freq: Every day | ORAL | Status: DC

## 2010-12-05 MED ORDER — NIFEDIPINE ER OSMOTIC RELEASE 60 MG PO TB24: 60.0000 mg | ORAL_TABLET | Freq: Every day | ORAL | Status: DC

## 2010-12-05 MED ORDER — METOPROLOL SUCCINATE ER 50 MG PO TB24: 50.0000 mg | ORAL_TABLET | Freq: Every day | ORAL | Status: DC

## 2010-12-05 NOTE — Telephone Encounter (Signed)
Patient is going to be taking a trip to [place] and is just wanting to have a printed Rx for all [3] HTN medications in the occasion if he were to lose his filled prescriptions for any reason. All Rx[s] printed, ready for MD signature. Patient informed will have signed first thing in morning 12/06/10 and ready for P/U.

## 2010-12-13 ENCOUNTER — Other Ambulatory Visit: Payer: Self-pay | Admitting: Internal Medicine

## 2010-12-16 NOTE — Telephone Encounter (Signed)
Done

## 2011-01-16 ENCOUNTER — Other Ambulatory Visit: Payer: Self-pay | Admitting: Internal Medicine

## 2011-02-15 ENCOUNTER — Other Ambulatory Visit: Payer: Self-pay | Admitting: Internal Medicine

## 2011-03-17 ENCOUNTER — Other Ambulatory Visit: Payer: Self-pay | Admitting: Internal Medicine

## 2011-03-17 MED ORDER — BENAZEPRIL HCL 20 MG PO TABS: 20.0000 mg | ORAL_TABLET | Freq: Every day | ORAL | Status: DC

## 2011-03-17 MED ORDER — NIFEDIPINE ER OSMOTIC RELEASE 60 MG PO TB24: 60.0000 mg | ORAL_TABLET | Freq: Every day | ORAL | Status: DC

## 2011-03-17 MED ORDER — SIMVASTATIN 40 MG PO TABS: 40.0000 mg | ORAL_TABLET | Freq: Every day | ORAL | Status: DC

## 2011-03-17 MED ORDER — OMEPRAZOLE 20 MG PO CPDR: 20.0000 mg | DELAYED_RELEASE_CAPSULE | Freq: Every day | ORAL | Status: DC

## 2011-03-17 MED ORDER — METOPROLOL SUCCINATE ER 50 MG PO TB24: 50.0000 mg | ORAL_TABLET | Freq: Every day | ORAL | Status: DC

## 2011-03-17 NOTE — Telephone Encounter (Signed)
Rx sent 

## 2011-03-26 ENCOUNTER — Ambulatory Visit (INDEPENDENT_AMBULATORY_CARE_PROVIDER_SITE_OTHER): Payer: Medicare Other | Admitting: Internal Medicine

## 2011-03-26 ENCOUNTER — Encounter: Payer: Self-pay | Admitting: Internal Medicine

## 2011-03-26 VITALS — BP 148/100 | HR 88 | Temp 97.6°F | Wt 212.0 lb

## 2011-03-26 DIAGNOSIS — R600 Localized edema: Secondary | ICD-10-CM

## 2011-03-26 DIAGNOSIS — N400 Enlarged prostate without lower urinary tract symptoms: Secondary | ICD-10-CM

## 2011-03-26 DIAGNOSIS — I10 Essential (primary) hypertension: Secondary | ICD-10-CM

## 2011-03-26 DIAGNOSIS — E119 Type 2 diabetes mellitus without complications: Secondary | ICD-10-CM

## 2011-03-26 DIAGNOSIS — R609 Edema, unspecified: Secondary | ICD-10-CM

## 2011-03-26 LAB — TSH: TSH: 1.36 u[IU]/mL (ref 0.35–5.50)

## 2011-03-26 LAB — BASIC METABOLIC PANEL
BUN: 24 mg/dL — ABNORMAL HIGH (ref 6–23)
CO2: 29 mEq/L (ref 19–32)
Calcium: 8.8 mg/dL (ref 8.4–10.5)
Creatinine, Ser: 1.2 mg/dL (ref 0.4–1.5)
Potassium: 4.6 mEq/L (ref 3.5–5.1)
Sodium: 142 mEq/L (ref 135–145)

## 2011-03-26 MED ORDER — METOPROLOL SUCCINATE ER 50 MG PO TB24: 100.0000 mg | ORAL_TABLET | Freq: Every day | ORAL | Status: DC

## 2011-03-26 NOTE — Assessment & Plan Note (Signed)
Due for labs , diet-exercise discussed

## 2011-03-26 NOTE — Assessment & Plan Note (Signed)
Stable, u/s 7-12 neg for DVT

## 2011-03-26 NOTE — Patient Instructions (Signed)
Increase metoprolol Check the  blood pressure 2 or 3 times a week, be sure it is less than 135/85. If it is consistently higher, let me know

## 2011-03-26 NOTE — Assessment & Plan Note (Signed)
Saw urology 03-2011, stable, got a PSA

## 2011-03-26 NOTE — Assessment & Plan Note (Addendum)
BP elevated today, amb BPs also elevated at times Last creatinine slt elevated, US of the kidney neg for obstructio, good medication compliance Plan: Labs Increase metoprolol, see instructions

## 2011-03-26 NOTE — Progress Notes (Signed)
  Subjective:    Patient ID: Ronald [name], male    DOB: Jul 17, 1934, 77 y.o.   MRN: [id number]  HPI ROV In general feeling well. 6 months ago, his girlfriend reported "dyspnea on exertion", patient denies any problems today Hypertension--good medication compliance, ambulatory blood pressures run from normal  up to 150/90. Also his creatinine was noted to be slightly elevated the last time, ultrasound showed no obstruction. Diabetes--no ambulatory blood sugars, on diet control only. Due for labs BPH--sore urology recently, stable.  Past Medical History: Hyperlipidemia Hypertension AODM  GERD h/o CALCULUS OF KIDNEY s/p R  percutaneus nephrostomy @ [facility] Hx of HYPERCALCEMIA ----> Parathyroidectomy (04/2008) Benign prostatic hypertrophy, f/u @ [facility], s/p Bx 2009 aprox: (-)  Asymmetric edema---> L leg larger than R , [place] neg for DVT 09-2010  Past Surgical History: Appendectomy eye surgery Tonsillectomy Parathyroidectomy (04/2008)  Social History: Retired Single, has  a girlfriend  3 kids  Former Smoker Alcohol use-yes Drug use-no Regular exercise-no   Review of Systems No chest pain or shortness of breath lower extremity edema at baseline No nausea, vomiting, diarrhea. No difficulty urinating or blood in the stool but as usual has a weak urinary stream. No myalgias, occasionally has calf cramps    Objective:   Physical Exam  Constitutional: He appears well-developed and well-nourished.  HENT:  Head: Normocephalic and atraumatic.  Cardiovascular: Normal rate and regular rhythm.   No murmur heard. Pulmonary/Chest: Effort normal and breath sounds normal. No respiratory distress. He has no wheezes. He has no rales.  Musculoskeletal:       Asymmetric edema in the lower extremities, worse on the left.      Assessment & Plan:

## 2011-03-27 ENCOUNTER — Encounter: Payer: Self-pay | Admitting: Internal Medicine

## 2011-03-27 ENCOUNTER — Telehealth: Payer: Self-pay | Admitting: Internal Medicine

## 2011-03-27 NOTE — Telephone Encounter (Signed)
Patient states that he had labs drawn yesterday. He wanted to let Dr. Drue Novel know that he did have a cup of coffee with sugar prior to labs.

## 2011-04-01 NOTE — Telephone Encounter (Signed)
Advised patient, labs are normal, good results

## 2011-04-02 NOTE — Telephone Encounter (Signed)
Informed patient. Copy mailed.

## 2011-05-07 ENCOUNTER — Encounter: Payer: Self-pay | Admitting: Gastroenterology

## 2011-05-22 ENCOUNTER — Telehealth: Payer: Self-pay | Admitting: Internal Medicine

## 2011-05-22 NOTE — Telephone Encounter (Signed)
Refills for  1-nifedical xl tab 60mg  tablet sr  2-omeprazole cap 30mg  capsule de

## 2011-05-28 MED ORDER — OMEPRAZOLE 20 MG PO CPDR: 20.0000 mg | DELAYED_RELEASE_CAPSULE | Freq: Every day | ORAL | Status: DC

## 2011-05-28 MED ORDER — NIFEDIPINE ER OSMOTIC RELEASE 60 MG PO TB24: 60.0000 mg | ORAL_TABLET | Freq: Every day | ORAL | Status: DC

## 2011-05-28 NOTE — Telephone Encounter (Signed)
Refill done.  

## 2011-07-08 ENCOUNTER — Other Ambulatory Visit: Payer: Self-pay | Admitting: Dermatology

## 2011-08-26 ENCOUNTER — Telehealth: Payer: Self-pay | Admitting: Internal Medicine

## 2011-08-26 MED ORDER — BENAZEPRIL HCL 20 MG PO TABS: 20.0000 mg | ORAL_TABLET | Freq: Every day | ORAL | Status: DC

## 2011-08-26 MED ORDER — SIMVASTATIN 40 MG PO TABS
40.0000 mg | ORAL_TABLET | Freq: Every day | ORAL | Status: DC
Start: 2011-08-26 — End: 2011-11-17

## 2011-08-26 NOTE — Telephone Encounter (Signed)
Refill: Simvastatin tab 40mg . Take 1 by mouth at bedtime (due for labs now). 90 day supply

## 2011-08-26 NOTE — Telephone Encounter (Signed)
Refill: Benazepril tab 20mg . Take 1 by mouth daily (due for office visit now). 90 day supply

## 2011-08-26 NOTE — Telephone Encounter (Signed)
Refill done.  

## 2011-09-24 ENCOUNTER — Ambulatory Visit (INDEPENDENT_AMBULATORY_CARE_PROVIDER_SITE_OTHER): Payer: Medicare Other | Admitting: Internal Medicine

## 2011-09-24 ENCOUNTER — Encounter: Payer: Self-pay | Admitting: Internal Medicine

## 2011-09-24 VITALS — BP 126/84 | HR 71 | Temp 98.2°F | Ht 67.25 in | Wt 211.0 lb

## 2011-09-24 DIAGNOSIS — E785 Hyperlipidemia, unspecified: Secondary | ICD-10-CM

## 2011-09-24 DIAGNOSIS — I10 Essential (primary) hypertension: Secondary | ICD-10-CM

## 2011-09-24 DIAGNOSIS — E119 Type 2 diabetes mellitus without complications: Secondary | ICD-10-CM

## 2011-09-24 DIAGNOSIS — E213 Hyperparathyroidism, unspecified: Secondary | ICD-10-CM

## 2011-09-24 DIAGNOSIS — Z0000 Encounter for general adult medical examination without abnormal findings: Secondary | ICD-10-CM

## 2011-09-24 LAB — COMPREHENSIVE METABOLIC PANEL
ALT: 19 U/L (ref 0–53)
AST: 19 U/L (ref 0–37)
Albumin: 3.5 g/dL (ref 3.5–5.2)
Alkaline Phosphatase: 59 U/L (ref 39–117)
Glucose, Bld: 118 mg/dL — ABNORMAL HIGH (ref 70–99)
Potassium: 4.6 mEq/L (ref 3.5–5.1)
Sodium: 141 mEq/L (ref 135–145)
Total Protein: 6.4 g/dL (ref 6.0–8.3)

## 2011-09-24 LAB — LIPID PANEL
LDL Cholesterol: 94 mg/dL (ref 0–99)
Total CHOL/HDL Ratio: 4

## 2011-09-24 LAB — HEMOGLOBIN A1C: Hgb A1c MFr Bld: 6.5 % (ref 4.6–6.5)

## 2011-09-24 NOTE — Assessment & Plan Note (Signed)
On diet only, not interested in  exercise. Labs

## 2011-09-24 NOTE — Assessment & Plan Note (Signed)
Monitoring calcium levels

## 2011-09-24 NOTE — Assessment & Plan Note (Signed)
Good compliance with medication, check a FLP

## 2011-09-24 NOTE — Assessment & Plan Note (Addendum)
TD 2007 Pneumonia shot-- never had one, benefits explained, declined  zostavax-- had already  PSA--  Sees Dr Patsi Sears yearly  Cscope per chart review was 03-2006, Bx adenomatous polyp (Dr Jarold Motto), due for another cscope-- referral done  Continue with his healthy diet, exercise more frequently

## 2011-09-24 NOTE — Assessment & Plan Note (Signed)
Recommend to check his BP from time to time. BP today normal. Good medication compliance, check a BMP

## 2011-09-24 NOTE — Progress Notes (Signed)
  Subjective:    Patient ID: Ronald [name], male    DOB: [birth year], 77 y.o.   MRN: [id number]  HPI Here for Medicare AWV: 1. Risk factors based on Past M, S, F history: reviewed 2. Physical Activities:  rarely does some physical activity 3. Depression/mood:  No problems noted or reported 4. Hearing: No problems noted or reported   5. ADL's:  Lives by himself has a GF, totally independent   6. Fall Risk: no recent falls, prevention discussed 7. home Safety: does feel safe at home   8. Height, weight, &visual acuity: see VS, uses glasses , slt decreased vision, saw the eye doctor, was referred to a specialist   9. Counseling: provided 10. Labs ordered based on risk factors: if needed   11. Referral Coordination: if needed 12.  Care Plan, see assessment and plan   13.   Cognitive Assessment:motor and cognition skills normal  In addition, today we discussed the following: Hypertension, good medication compliance, no ambulatory BPs. High cholesterol, good medication compliance, no apparent side effects. History of diabetes, no ambulatory CBGs, currently on diet only. History of BPH, sees urology regularly, currently asymptomatic   Past Medical History: Hyperlipidemia Hypertension AODM   GERD h/o CALCULUS OF KIDNEY s/p R  percutaneus nephrostomy @ [facility] Hx of HYPERCALCEMIA ----> Parathyroidectomy (04/2008) Benign prostatic hypertrophy, f/u @ [facility], s/p Bx 2009 aprox: (-)   Asymmetric edema---> L leg larger than R , [place] neg for DVT 09-2010  Past Surgical History: Appendectomy eye surgery Tonsillectomy Parathyroidectomy (04/2008)  Social History: Retired, single, has  a girlfriend , 3 kids   Daughter has a copy of his living will Former Smoker Alcohol use-yes Drug use-no Diet-- healthy   Family History:  MI-- F (age 80s), M  colon ca--no  prostate ca--no  DM--no   Review of Systems No chest pain or shortness of breath No nausea, vomiting, diarrhea. No blood in  the stools. No dysuria or gross hematuria.     Objective:   Physical Exam  General -- alert, well-developed. No apparent distress.  Neck --no LADs, normal carotid pulses, no bruit Lungs -- normal respiratory effort, no intercostal retractions, no accessory muscle use, and normal breath sounds.   Heart-- normal rate, regular rhythm, no murmur, and no gallop.   Abdomen--soft, non-tender, no distention, no masses, no HSM, no guarding, and no rigidity. No bruit Extremities--  mild pitting edema, worse on the left Neurologic-- alert & oriented X3 and strength normal in all extremities. Psych-- Cognition and judgment appear intact. Alert and cooperative with normal attention span and concentration.  not anxious appearing and not depressed appearing.      Assessment & Plan:

## 2011-09-26 ENCOUNTER — Encounter: Payer: Self-pay | Admitting: Gastroenterology

## 2011-10-06 ENCOUNTER — Encounter: Payer: Self-pay | Admitting: Gastroenterology

## 2011-10-06 ENCOUNTER — Ambulatory Visit (AMBULATORY_SURGERY_CENTER): Payer: Medicare Other | Admitting: *Deleted

## 2011-10-06 VITALS — Ht 67.25 in | Wt 212.0 lb

## 2011-10-06 DIAGNOSIS — Z1211 Encounter for screening for malignant neoplasm of colon: Secondary | ICD-10-CM

## 2011-10-06 DIAGNOSIS — Z86010 Personal history of colonic polyps: Secondary | ICD-10-CM

## 2011-10-06 MED ORDER — MOVIPREP 100 G PO SOLR: ORAL | Status: DC

## 2011-10-09 ENCOUNTER — Telehealth: Payer: Self-pay | Admitting: Gastroenterology

## 2011-10-09 NOTE — Telephone Encounter (Signed)
Pt. Question re: deep sedation answered

## 2011-10-15 ENCOUNTER — Telehealth: Payer: Self-pay | Admitting: Gastroenterology

## 2011-10-15 NOTE — Telephone Encounter (Signed)
Dr Kaplan, will you accept the pt? Thanks. 

## 2011-10-15 NOTE — Telephone Encounter (Signed)
Sure

## 2011-10-15 NOTE — Telephone Encounter (Signed)
yes

## 2011-10-15 NOTE — Telephone Encounter (Signed)
Dr Jarold Motto, OK for pt to switch? Thanks.

## 2011-10-20 ENCOUNTER — Encounter: Payer: Medicare Other | Admitting: Gastroenterology

## 2011-11-03 ENCOUNTER — Encounter: Payer: Self-pay | Admitting: Gastroenterology

## 2011-11-11 LAB — HM DIABETES EYE EXAM

## 2011-11-17 ENCOUNTER — Other Ambulatory Visit: Payer: Self-pay | Admitting: Internal Medicine

## 2011-11-17 ENCOUNTER — Encounter: Payer: Medicare Other | Admitting: Gastroenterology

## 2011-11-17 MED ORDER — BENAZEPRIL HCL 20 MG PO TABS: 20.0000 mg | ORAL_TABLET | Freq: Every day | ORAL | Status: DC

## 2011-11-17 MED ORDER — SIMVASTATIN 40 MG PO TABS: 40.0000 mg | ORAL_TABLET | Freq: Every day | ORAL | Status: DC

## 2011-11-17 NOTE — Telephone Encounter (Signed)
Refills x  2 last ov 7.17.13 V70--both below last wrt 6.18.13 #90 no refills   1-Simvastatin (Tab) 40 MG Take 1 tablet (40 mg total) by mouth at bedtime #90  2-Benazepril HCl (Tab) 20 MG Take 1 tablet (20 mg total) by mouth daily #90

## 2011-11-17 NOTE — Telephone Encounter (Signed)
Refill done.  

## 2011-11-19 ENCOUNTER — Encounter: Payer: Self-pay | Admitting: *Deleted

## 2011-12-01 ENCOUNTER — Ambulatory Visit (INDEPENDENT_AMBULATORY_CARE_PROVIDER_SITE_OTHER): Payer: Medicare Other | Admitting: Gastroenterology

## 2011-12-01 ENCOUNTER — Encounter: Payer: Self-pay | Admitting: Gastroenterology

## 2011-12-01 VITALS — BP 130/80 | HR 75 | Ht 66.5 in | Wt 212.2 lb

## 2011-12-01 DIAGNOSIS — Z860100 Personal history of colon polyps, unspecified: Secondary | ICD-10-CM

## 2011-12-01 DIAGNOSIS — K648 Other hemorrhoids: Secondary | ICD-10-CM

## 2011-12-01 NOTE — Progress Notes (Signed)
History of Present Illness: Pleasant 77-year-old white male with history of colon polyps and hemorrhoids referred for followup colonoscopy and therapy for hemorrhoids. Adenomatous polyps were removed in 2008. He's had problems with prolapsed hemorrhoids for years. He's taken topicals without relief. Hemorrhoids usually spontaneously retractor but sometimes have to be annually reduced. He often has the sensation of incomplete evacuation. He tends to be constipated and has a bowel movement every 4-5 days. He denies straining. There is no history of bleeding. He thinks he underwent hemorrhoidal banding 30 years ago and had severe pain following the procedure    Past Medical History  Diagnosis Date  . GERD (gastroesophageal reflux disease)   . Hyperlipidemia   . Hypertension   . Diabetes mellitus   . Calculus of kidney     s/p R percutaneus nephrostomy @ [facility] 2009  . Hypercalcemia     parathyroidectomy 04/2008  . BPH (benign prostatic hypertrophy)     f/u @ [facility], s/p bx 2009 apro (-)  . Hemorrhoids    Past Surgical History  Procedure Date  . Appendectomy   . Eye surgery infant  . Tonsillectomy   . Parathyroidectomy 04/2008   family history includes Heart attack in his mother and Heart attack (age of onset:50) in his father.  There is no history of Colon cancer, and Prostate cancer, and Diabetes, . Current Outpatient Prescriptions  Medication Sig Dispense Refill  . Magnesium 250 MG TABS Take by mouth daily.      . benazepril (LOTENSIN) 20 MG tablet Take 1 tablet (20 mg total) by mouth daily.  90 tablet  1  . cholecalciferol (VITAMIN D) 1000 UNITS tablet Take 1,000 Units by mouth daily.        . metoprolol succinate (TOPROL-XL) 50 MG 24 hr tablet Take 50 mg by mouth daily.      . MOVIPREP 100 G SOLR moviprep-take as directed.  1 kit  0  . NIFEdipine (NIFEDICAL XL) 60 MG 24 hr tablet Take 1 tablet (60 mg total) by mouth daily.  90 tablet  3  . omeprazole (PRILOSEC) 20 MG capsule Take 1  capsule (20 mg total) by mouth daily.  90 capsule  3  . saw palmetto 160 MG capsule Take 160 mg by mouth 2 (two) times daily.        . simvastatin (ZOCOR) 40 MG tablet Take 1 tablet (40 mg total) by mouth at bedtime.  90 tablet  1   Allergies as of 12/01/2011 - Review Complete 12/01/2011  Allergen Reaction Noted  . Cucumber extract Nausea And Vomiting 10/06/2011  . Sulfonamide derivatives Other (See Comments) 02/09/2007    reports that he quit smoking about 30 years ago. His smoking use included Cigarettes. He quit after 25 years of use. He has never used smokeless tobacco. He reports that he drinks about 2.4 ounces of alcohol per week. He reports that he does not use illicit drugs.     Review of Systems: Pertinent positive and negative review of systems were noted in the above HPI section. All other review of systems were otherwise negative.  Vital signs were reviewed in today's medical record Physical Exam: General: Well developed , well nourished, no acute distress Head: Normocephalic and atraumatic Eyes:  sclerae anicteric, EOMI Ears: Normal auditory acuity Mouth: No deformity or lesions Neck: Supple, no masses or thyromegaly Lungs: Clear throughout to auscultation Heart: Regular rate and rhythm; no murmurs, rubs or bruits Abdomen: Soft, non tender and non distended. No masses, hepatosplenomegaly or   hernias noted. Normal Bowel sounds Rectal: There are no external lesions Musculoskeletal: Symmetrical with no gross deformities  Skin: No lesions on visible extremities Pulses:  Normal pulses noted Extremities: No clubbing, cyanosis, edema or deformities noted; bilateral ankle edema is noted Neurological: Alert oriented x 4, grossly nonfocal Cervical Nodes:  No significant cervical adenopathy Inguinal Nodes: No significant inguinal adenopathy Psychological:  Alert and cooperative. Normal mood and affect       

## 2011-12-01 NOTE — Patient Instructions (Addendum)
You have been scheduled for your procedure Separate instructions have been given We have given you samples of Linzess  today

## 2011-12-01 NOTE — Assessment & Plan Note (Addendum)
Plan hemorrhoidal banding for grade 3  hemorrhoids  Risks, alternatives, and complications of the procedure, including bleeding, perforation, and pain were explained to the patient.  Patient's questions were answered.   Marland Kitchen

## 2011-12-01 NOTE — Assessment & Plan Note (Signed)
Plan followup colonoscopy 

## 2011-12-16 ENCOUNTER — Encounter (HOSPITAL_COMMUNITY): Payer: Self-pay | Admitting: Anesthesiology

## 2011-12-16 ENCOUNTER — Encounter (HOSPITAL_COMMUNITY)
Admission: RE | Disposition: A | Discharge status: Home / Self Care | Payer: Self-pay | Source: Ambulatory Visit | Attending: Gastroenterology

## 2011-12-16 ENCOUNTER — Encounter (HOSPITAL_COMMUNITY): Payer: Self-pay | Admitting: Gastroenterology

## 2011-12-16 ENCOUNTER — Ambulatory Visit (HOSPITAL_COMMUNITY): Payer: Medicare Other | Admitting: Anesthesiology

## 2011-12-16 ENCOUNTER — Encounter (HOSPITAL_COMMUNITY): Payer: Self-pay | Admitting: *Deleted

## 2011-12-16 ENCOUNTER — Ambulatory Visit (HOSPITAL_COMMUNITY)
Admission: RE | Admit: 2011-12-16 | Discharge: 2011-12-16 | Disposition: A | Discharge status: Home / Self Care | Payer: Medicare Other | Source: Ambulatory Visit | Attending: Gastroenterology | Admitting: Gastroenterology

## 2011-12-16 DIAGNOSIS — K5730 Diverticulosis of large intestine without perforation or abscess without bleeding: Secondary | ICD-10-CM | POA: Insufficient documentation

## 2011-12-16 DIAGNOSIS — Z79899 Other long term (current) drug therapy: Secondary | ICD-10-CM | POA: Insufficient documentation

## 2011-12-16 DIAGNOSIS — Z86010 Personal history of colonic polyps: Secondary | ICD-10-CM

## 2011-12-16 DIAGNOSIS — I10 Essential (primary) hypertension: Secondary | ICD-10-CM | POA: Insufficient documentation

## 2011-12-16 DIAGNOSIS — K219 Gastro-esophageal reflux disease without esophagitis: Secondary | ICD-10-CM | POA: Insufficient documentation

## 2011-12-16 DIAGNOSIS — Z09 Encounter for follow-up examination after completed treatment for conditions other than malignant neoplasm: Secondary | ICD-10-CM | POA: Insufficient documentation

## 2011-12-16 DIAGNOSIS — K648 Other hemorrhoids: Secondary | ICD-10-CM

## 2011-12-16 DIAGNOSIS — D126 Benign neoplasm of colon, unspecified: Secondary | ICD-10-CM | POA: Insufficient documentation

## 2011-12-16 HISTORY — DX: Other complications of anesthesia, initial encounter: T88.59XA

## 2011-12-16 HISTORY — DX: Adverse effect of unspecified anesthetic, initial encounter: T41.45XA

## 2011-12-16 HISTORY — PX: COLONOSCOPY: SHX5424

## 2011-12-16 LAB — GLUCOSE, CAPILLARY: Glucose-Capillary: 106 mg/dL — ABNORMAL HIGH (ref 70–99)

## 2011-12-16 SURGERY — COLONOSCOPY
Anesthesia: General

## 2011-12-16 MED ORDER — ACETAMINOPHEN-CODEINE #3 300-30 MG PO TABS
ORAL_TABLET | ORAL | Status: AC
  Filled 2011-12-16: qty 2

## 2011-12-16 MED ORDER — ACETAMINOPHEN-CODEINE #3 300-30 MG PO TABS
2.0000 | ORAL_TABLET | Freq: Once | ORAL | Status: AC
  Administered 2011-12-16: 2 via ORAL

## 2011-12-16 MED ORDER — PROPOFOL 10 MG/ML IV BOLUS
INTRAVENOUS | Status: DC | PRN
  Administered 2011-12-16: 20 mg via INTRAVENOUS
  Administered 2011-12-16: 15 mg via INTRAVENOUS
  Administered 2011-12-16: 40 mg via INTRAVENOUS
  Administered 2011-12-16: 50 mg via INTRAVENOUS
  Administered 2011-12-16: 15 mg via INTRAVENOUS

## 2011-12-16 MED ORDER — FENTANYL CITRATE 0.05 MG/ML IJ SOLN
INTRAMUSCULAR | Status: DC | PRN
  Administered 2011-12-16: 50 ug via INTRAVENOUS

## 2011-12-16 MED ORDER — MIDAZOLAM HCL 5 MG/5ML IJ SOLN
INTRAMUSCULAR | Status: DC | PRN
  Administered 2011-12-16: 2 mg via INTRAVENOUS

## 2011-12-16 MED ORDER — SODIUM CHLORIDE 0.9 % IV SOLN
INTRAVENOUS | Status: DC
  Administered 2011-12-16: 1000 mL via INTRAVENOUS

## 2011-12-16 NOTE — Transfer of Care (Signed)
Immediate Anesthesia Transfer of Care Note  Patient: Ronald [name]  Procedure(s) Performed: Procedure(s) (LRB) with comments: COLONOSCOPY (N/A) HEMORRHOID BANDING (N/A)  Patient Location: PACU  Anesthesia Type: MAC  Level of Consciousness: awake, sedated and patient cooperative  Airway & Oxygen Therapy: Patient Spontanous Breathing and Patient connected to face mask oxygen  Post-op Assessment: Report given to PACU RN and Post -op Vital signs reviewed and stable  Post vital signs: Reviewed and stable  Complications: No apparent anesthesia complications

## 2011-12-16 NOTE — Interval H&P Note (Signed)
History and Physical Interval Note:  12/16/2011 9:17 AM  Ronald [name]  has presented today for surgery, with the diagnosis of hemorrhoids  The various methods of treatment have been discussed with the patient and family. After consideration of risks, benefits and other options for treatment, the patient has consented to  Procedure(s) (LRB) with comments: COLONOSCOPY (N/A) HEMORRHOID BANDING (N/A) as a surgical intervention .  The patient's history has been reviewed, patient examined, no change in status, stable for surgery.  I have reviewed the patient's chart and labs.  Questions were answered to the patient's satisfaction.    The recent H&P (dated *12/01/11**) was reviewed, the patient was examined and there is no change in the patients condition since that H&P was completed.   [name]  12/16/2011, 9:17 AM    [name]

## 2011-12-16 NOTE — H&P (View-Only) (Signed)
History of Present Illness: Pleasant 77 year old white male with history of colon polyps and hemorrhoids referred for followup colonoscopy and therapy for hemorrhoids. Adenomatous polyps were removed in 2008. He's had problems with prolapsed hemorrhoids for years. He's taken topicals without relief. Hemorrhoids usually spontaneously retractor but sometimes have to be annually reduced. He often has the sensation of incomplete evacuation. He tends to be constipated and has a bowel movement every 4-5 days. He denies straining. There is no history of bleeding. He thinks he underwent hemorrhoidal banding 30 years ago and had severe pain following the procedure    Past Medical History  Diagnosis Date  . GERD (gastroesophageal reflux disease)   . Hyperlipidemia   . Hypertension   . Diabetes mellitus   . Calculus of kidney     s/p R percutaneus nephrostomy @ [facility]  . Hypercalcemia     parathyroidectomy 04/2008  . BPH (benign prostatic hypertrophy)     f/u @ [facility], s/p bx 2009 apro (-)  . Hemorrhoids    Past Surgical History  Procedure Date  . Appendectomy   . Eye surgery infant  . Tonsillectomy   . Parathyroidectomy 04/2008   family history includes Heart attack in his mother and Heart attack (age of onset:50) in his father.  There is no history of Colon cancer, and Prostate cancer, and Diabetes, . Current Outpatient Prescriptions  Medication Sig Dispense Refill  . Magnesium 250 MG TABS Take by mouth daily.      . benazepril (LOTENSIN) 20 MG tablet Take 1 tablet (20 mg total) by mouth daily.  90 tablet  1  . cholecalciferol (VITAMIN D) 1000 UNITS tablet Take 1,000 Units by mouth daily.        . metoprolol succinate (TOPROL-XL) 50 MG 24 hr tablet Take 50 mg by mouth daily.      Marland Kitchen MOVIPREP 100 G SOLR moviprep-take as directed.  1 kit  0  . NIFEdipine (NIFEDICAL XL) 60 MG 24 hr tablet Take 1 tablet (60 mg total) by mouth daily.  90 tablet  3  . omeprazole (PRILOSEC) 20 MG capsule Take 1  capsule (20 mg total) by mouth daily.  90 capsule  3  . saw palmetto 160 MG capsule Take 160 mg by mouth 2 (two) times daily.        . simvastatin (ZOCOR) 40 MG tablet Take 1 tablet (40 mg total) by mouth at bedtime.  90 tablet  1   Allergies as of 12/01/2011 - Review Complete 12/01/2011  Allergen Reaction Noted  . Cucumber extract Nausea And Vomiting 10/06/2011  . Sulfonamide derivatives Other (See Comments) 02/09/2007    reports that he quit smoking about 30 years ago. His smoking use included Cigarettes. He quit after 25 years of use. He has never used smokeless tobacco. He reports that he drinks about 2.4 ounces of alcohol per week. He reports that he does not use illicit drugs.     Review of Systems: Pertinent positive and negative review of systems were noted in the above HPI section. All other review of systems were otherwise negative.  Vital signs were reviewed in today's medical record Physical Exam: General: Well developed , well nourished, no acute distress Head: Normocephalic and atraumatic Eyes:  sclerae anicteric, EOMI Ears: Normal auditory acuity Mouth: No deformity or lesions Neck: Supple, no masses or thyromegaly Lungs: Clear throughout to auscultation Heart: Regular rate and rhythm; no murmurs, rubs or bruits Abdomen: Soft, non tender and non distended. No masses, hepatosplenomegaly or  hernias noted. Normal Bowel sounds Rectal: There are no external lesions Musculoskeletal: Symmetrical with no gross deformities  Skin: No lesions on visible extremities Pulses:  Normal pulses noted Extremities: No clubbing, cyanosis, edema or deformities noted; bilateral ankle edema is noted Neurological: Alert oriented x 4, grossly nonfocal Cervical Nodes:  No significant cervical adenopathy Inguinal Nodes: No significant inguinal adenopathy Psychological:  Alert and cooperative. Normal mood and affect

## 2011-12-16 NOTE — Op Note (Addendum)
[facility] [address] [place], [address]   COLONOSCOPY PROCEDURE REPORT  PATIENT: Ronald [name], Ronald [name]  MR#: [id number] BIRTHDATE: 03/06/1935 , 77  yrs. old GENDER: Male ENDOSCOPIST: Louis Meckel, MD REFERRED XB:JYNW Drue Novel, M.D. PROCEDURE DATE:  12/16/2011 PROCEDURE:   Colonoscopy with snare polypectomy, Colonoscopy with cold biopsy polypectomy, and Hemorrhoidectomy via banding, clips or ligation ASA CLASS:   Class II INDICATIONS:patient's personal history of colon polyps; hemorrhoids MEDICATIONS: MAC sedation, administered by CRNA  DESCRIPTION OF PROCEDURE:   After the risks benefits and alternatives of the procedure were thoroughly explained, informed consent was obtained.  A digital rectal exam revealed no abnormalities of the rectum.   The EC-[id number] ([id number])  endoscope was introduced through the anus and advanced to the cecum, which was identified by both the appendix and ileocecal valve. No adverse events experienced.   The quality of the prep was Suprep excellent The instrument was then slowly withdrawn as the colon was fully examined.      COLON FINDINGS: There was a 5-48mm sessile polyp in the ascending colon.  Polyp was removed with cold polypectomy snare and submitted to pathollogy.   A pedunculated polyp measuring 5 mm in size was found at the splenic flexure.  A polypectomy was performed using snare cautery.  The resection was complete and the polyp tissue was completely retrieved.   Two sessile polyps measuring 2 mm in size were found in the sigmoid colon.  A polypectomy was performed with cold forceps.  The resection was complete and the polyp tissue was completely retrieved.   Moderate diverticulosis was noted in the sigmoid colon.   Hemorrhoids were found.  Retroflexed views revealed no abnormalities. 4 hemorrhoidal bands were placed just above the dentate line encompassing the 3 large internal hemorrhoid bundles.The time to cecum=6 minutes  0 seconds.  Withdrawal time=13 minutes 0 seconds.  The scope was withdrawn and the procedure completed. COMPLICATIONS: There were no complications.  ENDOSCOPIC IMPRESSION: 1.   Pedunculated polyp measuring 5 mm in size was found at the splenic flexure; polypectomy was performed using snare cautery 2.   Two sessile polyps measuring 2 mm in size were found in the sigmoid colon; polypectomy was performed with cold forceps 3.  Ascending colon polyp 4.   Moderate diverticulosis was noted in the sigmoid colon 5.   Hemorrhoids - status post band ligation  RECOMMENDATIONS: 1.  If the polyp(s) removed today are proven to be adenomatous (pre-cancerous) polyps, you will need a repeat colonoscopy in 5 years.  Otherwise you should continue to follow colorectal cancer screening guidelines for "routine risk" patients with colonoscopy in 10 years.  You will receive a letter within 1-2 weeks with the results of your biopsy as well as final recommendations.  Please call my office if you have not received a letter after 3 weeks. 2.  Call to schedule a follow-up appointment with office 1 month(s)   eSigned:  Louis Meckel, MD 12/18/2011 9:51 AM Revised: 12/18/2011 9:51 AM  cc:   PATIENT NAME:  Ronald [name], Ronald [name] MR#: [id number]

## 2011-12-16 NOTE — Anesthesia Postprocedure Evaluation (Signed)
  Anesthesia Post-op Note  Patient: Ronald [name]  Procedure(s) Performed: Procedure(s) (LRB): COLONOSCOPY (N/A) HEMORRHOID BANDING (N/A)  Patient Location: PACU  Anesthesia Type: MAC  Level of Consciousness: awake and alert   Airway and Oxygen Therapy: Patient Spontanous Breathing  Post-op Pain: mild  Post-op Assessment: Post-op Vital signs reviewed, Patient's Cardiovascular Status Stable, Respiratory Function Stable, Patent Airway and No signs of Nausea or vomiting  Post-op Vital Signs: stable  Complications: No apparent anesthesia complications

## 2011-12-16 NOTE — Anesthesia Preprocedure Evaluation (Addendum)
Anesthesia Evaluation  Patient identified by MRN, date of birth, ID band Patient awake    Reviewed: Allergy & Precautions, H&P , NPO status , Patient's Chart, lab work & pertinent test results  Airway Mallampati: II TM Distance: >3 FB Neck ROM: Full    Dental No notable dental hx.    Pulmonary neg pulmonary ROS,  breath sounds clear to auscultation  Pulmonary exam normal       Cardiovascular hypertension, Pt. on medications Rhythm:Regular Rate:Normal     Neuro/Psych negative neurological ROS  negative psych ROS   GI/Hepatic negative GI ROS, Neg liver ROS,   Endo/Other  diabetes, Type 2  Renal/GU negative Renal ROS  negative genitourinary   Musculoskeletal negative musculoskeletal ROS (+)   Abdominal   Peds negative pediatric ROS (+)  Hematology negative hematology ROS (+)   Anesthesia Other Findings   Reproductive/Obstetrics negative OB ROS                           Anesthesia Physical Anesthesia Plan  ASA: II  Anesthesia Plan: MAC   Post-op Pain Management:    Induction: Intravenous  Airway Management Planned: Nasal Cannula  Additional Equipment:   Intra-op Plan:   Post-operative Plan:   Informed Consent: I have reviewed the patients History and Physical, chart, labs and discussed the procedure including the risks, benefits and alternatives for the proposed anesthesia with the patient or authorized representative who has indicated his/her understanding and acceptance.   Dental advisory given  Plan Discussed with: CRNA and Surgeon  Anesthesia Plan Comments:        Anesthesia Quick Evaluation

## 2011-12-17 ENCOUNTER — Encounter (HOSPITAL_COMMUNITY): Payer: Self-pay | Admitting: Gastroenterology

## 2011-12-18 ENCOUNTER — Encounter: Payer: Self-pay | Admitting: Gastroenterology

## 2012-01-19 ENCOUNTER — Encounter: Payer: Self-pay | Admitting: Gastroenterology

## 2012-01-19 ENCOUNTER — Ambulatory Visit (INDEPENDENT_AMBULATORY_CARE_PROVIDER_SITE_OTHER): Payer: Medicare Other | Admitting: Gastroenterology

## 2012-01-19 VITALS — BP 122/80 | HR 80 | Ht 67.5 in | Wt 216.6 lb

## 2012-01-19 DIAGNOSIS — D126 Benign neoplasm of colon, unspecified: Secondary | ICD-10-CM

## 2012-01-19 DIAGNOSIS — K648 Other hemorrhoids: Secondary | ICD-10-CM

## 2012-01-19 DIAGNOSIS — Z860100 Personal history of colon polyps, unspecified: Secondary | ICD-10-CM

## 2012-01-19 NOTE — Progress Notes (Signed)
History of Present Illness:  The patient returns following colonoscopy. Small hyperplastic polyps were removed. He underwent band ligation and reports improvement in his hemorrhoidal symptoms although they remain.    Review of Systems: Pertinent positive and negative review of systems were noted in the above HPI section. All other review of systems were otherwise negative.    Current Medications, Allergies, Past Medical History, Past Surgical History, Family History and Social History were reviewed in [employer] electronic medical record  Vital signs were reviewed in today's medical record. Physical Exam: General: Well developed , well nourished, no acute distress

## 2012-01-19 NOTE — Assessment & Plan Note (Signed)
Colonoscopy negative for adenomatous polyps. Will repeat colonoscopy as needed. No routine exam because of age

## 2012-01-19 NOTE — Assessment & Plan Note (Signed)
Plan repeat colonoscopy as needed. No routine exam because of age

## 2012-01-19 NOTE — Patient Instructions (Addendum)
You have been scheduled for your procedure We have given you Linzess samples today Call back if you need a prescription

## 2012-01-19 NOTE — Assessment & Plan Note (Signed)
Status post band ligation with partial response. Therapeutic alternatives were discussed with the patient including no therapy, repeat band ligation and surgical ligation. The patient wishes to proceed with repeat band ligation.

## 2012-01-20 ENCOUNTER — Encounter: Payer: Self-pay | Admitting: Gastroenterology

## 2012-02-09 ENCOUNTER — Encounter (HOSPITAL_COMMUNITY): Payer: Self-pay | Admitting: *Deleted

## 2012-02-09 ENCOUNTER — Encounter (HOSPITAL_COMMUNITY)
Admission: RE | Disposition: A | Discharge status: Home / Self Care | Payer: Self-pay | Source: Ambulatory Visit | Attending: Gastroenterology

## 2012-02-09 ENCOUNTER — Ambulatory Visit (HOSPITAL_COMMUNITY)
Admission: RE | Admit: 2012-02-09 | Discharge: 2012-02-09 | Disposition: A | Discharge status: Home / Self Care | Payer: Medicare Other | Source: Ambulatory Visit | Attending: Gastroenterology | Admitting: Gastroenterology

## 2012-02-09 DIAGNOSIS — K648 Other hemorrhoids: Secondary | ICD-10-CM | POA: Insufficient documentation

## 2012-02-09 DIAGNOSIS — D126 Benign neoplasm of colon, unspecified: Secondary | ICD-10-CM

## 2012-02-09 DIAGNOSIS — Z86010 Personal history of colonic polyps: Secondary | ICD-10-CM

## 2012-02-09 HISTORY — PX: FLEXIBLE SIGMOIDOSCOPY: SHX5431

## 2012-02-09 HISTORY — PX: HEMORRHOID BANDING: SHX5850

## 2012-02-09 LAB — GLUCOSE, CAPILLARY: Glucose-Capillary: 93 mg/dL (ref 70–99)

## 2012-02-09 SURGERY — SIGMOIDOSCOPY, FLEXIBLE
Anesthesia: Moderate Sedation

## 2012-02-09 MED ORDER — SODIUM CHLORIDE 0.9 % IV SOLN
INTRAVENOUS | Status: DC
  Administered 2012-02-09: 500 mL via INTRAVENOUS

## 2012-02-09 MED ORDER — FENTANYL CITRATE 0.05 MG/ML IJ SOLN
INTRAMUSCULAR | Status: AC
  Filled 2012-02-09: qty 2

## 2012-02-09 MED ORDER — FENTANYL CITRATE 0.05 MG/ML IJ SOLN
INTRAMUSCULAR | Status: DC | PRN
  Administered 2012-02-09 (×2): 25 ug via INTRAVENOUS

## 2012-02-09 MED ORDER — MIDAZOLAM HCL 10 MG/2ML IJ SOLN
INTRAMUSCULAR | Status: AC
  Filled 2012-02-09: qty 2

## 2012-02-09 MED ORDER — MIDAZOLAM HCL 10 MG/2ML IJ SOLN
INTRAMUSCULAR | Status: DC | PRN
  Administered 2012-02-09 (×2): 2 mg via INTRAVENOUS

## 2012-02-09 NOTE — H&P (View-Only) (Signed)
History of Present Illness:  The patient returns following colonoscopy. Small hyperplastic polyps were removed. He underwent band ligation and reports improvement in his hemorrhoidal symptoms although they remain.    Review of Systems: Pertinent positive and negative review of systems were noted in the above HPI section. All other review of systems were otherwise negative.    Current Medications, Allergies, Past Medical History, Past Surgical History, Family History and Social History were reviewed in [facility] electronic medical record  Vital signs were reviewed in today's medical record. Physical Exam: General: Well developed , well nourished, no acute distress    

## 2012-02-09 NOTE — Interval H&P Note (Signed)
History and Physical Interval Note:  02/09/2012 12:37 PM  Ronald [name]  has presented today for surgery, with the diagnosis of 455.9   The various methods of treatment have been discussed with the patient and family. After consideration of risks, benefits and other options for treatment, the patient has consented to  Procedure(s) (LRB) with comments: FLEXIBLE SIGMOIDOSCOPY (N/A) HEMORRHOID BANDING (N/A) as a surgical intervention .  The patient's history has been reviewed, patient examined, no change in status, stable for surgery.  I have reviewed the patient's chart and labs.  Questions were answered to the patient's satisfaction.     The recent H&P (dated *01/19/12**) was reviewed, the patient was examined and there is no change in the patients condition since that H&P was completed.   [name]  02/09/2012, 12:37 PM   [name]

## 2012-02-09 NOTE — Discharge Instructions (Addendum)
Flexible Sigmoidoscopy Care After Read the instructions outlined below and refer to this sheet in the next few weeks. These discharge instructions provide you with general information on caring for yourself after you leave the hospital. Your doctor may also give you specific instructions. While your treatment has been planned according to the most current medical practices available, unavoidable complications occasionally occur. If you have any problems or questions after discharge, call your doctor. HOME CARE INSTRUCTIONS ACTIVITY:  You may resume your regular activity, but move at a slower pace for the next 24 hours.  Take frequent rest periods for the next 24 hours.  Walking will help get rid of the air and reduce the bloated feeling in your belly (abdomen).  No driving for 24 hours (because of the medicine (anesthesia) used during the test).  You may shower.  Do not sign any important legal documents or operate any machinery for 24 hours (because of the anesthesia used during the test). NUTRITION:  Drink plenty of fluids.  You may resume your normal diet as instructed by your doctor.  Begin with a light meal and progress to your normal diet. Heavy or fried foods are harder to digest and may make you feel sick to your stomach (nauseated).  Avoid alcoholic beverages for 24 hours or as instructed. MEDICATIONS:  You may resume your normal medications unless your doctor tells you otherwise. WHAT TO EXPECT TODAY:  Some feelings of bloating in the abdomen.  Passage of more gas than usual.  Spotting of blood in your stool or on the toilet paper. IF YOU HAD POLYPS REMOVED DURING THE COLONOSCOPY:  No aspirin products for 7 days or as instructed.  No alcohol for 7 days or as instructed.  Eat a soft diet for the next 24 hours. FINDING OUT THE RESULTS OF YOUR TEST Not all test results are available during your visit. If your test results are not back during the visit, make an  appointment with your caregiver to find out the results. Do not assume everything is normal if you have not heard from your caregiver or the medical facility. It is important for you to follow up on all of your test results.  SEEK IMMEDIATE MEDICAL CARE IF:  You have more than a spotting of blood in your stool.  Your belly is swollen (abdominal distention).  You are nauseated or vomiting.  You have a fever.  You have abdominal pain or discomfort that is severe or gets worse throughout the day. Document Released: 10/09/2003 Document Revised: 05/19/2011 Document Reviewed: 10/07/2007 [facility] Patient Information 2013 [place], [place].

## 2012-02-09 NOTE — Op Note (Signed)
[facility] [address] [place], [address]   FLEXIBLE SIGMOIDOSCOPY PROCEDURE REPORT  PATIENT: [name], Ronald  MR#: [id number] BIRTHDATE: 1934/06/10 , 77  yrs. old GENDER: Male ENDOSCOPIST: Louis Meckel, MD REFERRED BY: Willow Ora, M.D. PROCEDURE DATE:  02/09/2012 PROCEDURE:   Hemorrhoidectomy via banding, clips or ligation ASA CLASS:   Class II INDICATIONS:therapy of for previously diagnosed hemorrhoids. MEDICATIONS: These medications were titrated to patient response per physician's verbal order, Versed 4 mg IV, and Fentanyl 50 mcg IV  DESCRIPTION OF PROCEDURE:   After the risks benefits and alternatives of the procedure were thoroughly explained, informed consent was obtained.  revealed no abnormalities of the rectum. The endoscope was introduced through the anus  and advanced to the sigmoid colon , limited by No adverse events experienced.   The quality of the prep was    .  The instrument was then slowly withdrawn as the mucosa was fully examined.     1.  COLON FINDINGS: Internal hemorrhoids were noted on retroflexed view. 4 bands were placed across the 3 large hemorrhoidal bundles just above the dentate line.  Remainder the exam was normal. 2.  Internal hemorrhoids were noted on retroflexed view.  4 bands were placed across the 3 large hemorrhoidal bundles just above the dentate line.  Remainder the exam was normal. Retroflexed views revealed internal hemorrhoid.    The scope was then withdrawn from the patient and the procedure terminated.  COMPLICATIONS: There were no complications.  ENDOSCOPIC IMPRESSION: 1.   Internal hemorrhoids-status post band ligation  RECOMMENDATIONS: office visit one month  REPEAT EXAM:   _______________________________ eSignedLouis Meckel, MD 02/09/2012 12:57 PM   CC:

## 2012-02-10 ENCOUNTER — Encounter (HOSPITAL_COMMUNITY): Payer: Self-pay

## 2012-02-10 ENCOUNTER — Encounter (HOSPITAL_COMMUNITY): Payer: Self-pay | Admitting: Gastroenterology

## 2012-03-17 ENCOUNTER — Ambulatory Visit: Payer: Medicare Other | Admitting: Gastroenterology

## 2012-03-26 ENCOUNTER — Ambulatory Visit (INDEPENDENT_AMBULATORY_CARE_PROVIDER_SITE_OTHER): Payer: Medicare Other | Admitting: Internal Medicine

## 2012-03-26 VITALS — BP 112/82 | HR 64 | Temp 97.9°F | Wt 211.0 lb

## 2012-03-26 DIAGNOSIS — I10 Essential (primary) hypertension: Secondary | ICD-10-CM

## 2012-03-26 DIAGNOSIS — N400 Enlarged prostate without lower urinary tract symptoms: Secondary | ICD-10-CM

## 2012-03-26 DIAGNOSIS — E785 Hyperlipidemia, unspecified: Secondary | ICD-10-CM

## 2012-03-26 DIAGNOSIS — E119 Type 2 diabetes mellitus without complications: Secondary | ICD-10-CM

## 2012-03-26 DIAGNOSIS — Z23 Encounter for immunization: Secondary | ICD-10-CM

## 2012-03-26 LAB — HEMOGLOBIN A1C: Hgb A1c MFr Bld: 6.6 % — ABNORMAL HIGH (ref 4.6–6.5)

## 2012-03-26 NOTE — Assessment & Plan Note (Signed)
Well-controlled, no change 

## 2012-03-26 NOTE — Progress Notes (Signed)
  Subjective:    Patient ID: Ronald [name], male    DOB: [birth year], 78 y.o.   MRN: [id number]  HPI Six-month followup Doing well. Good medication compliance, not ambulatory BPs or ambulatory blood sugars.  Past Medical History: Hyperlipidemia Hypertension AODM   GERD h/o CALCULUS OF KIDNEY s/p R  percutaneus nephrostomy @ [facility] Hx of HYPERCALCEMIA ----> Parathyroidectomy (04/2008) Benign prostatic hypertrophy, f/u @ [facility], s/p Bx 2009 aprox: (-)   Asymmetric edema---> L leg larger than R , [place] neg for DVT 09-2010  Past Surgical History: Appendectomy eye surgery Tonsillectomy Parathyroidectomy (04/2008)  Social History: Retired, single, has  a girlfriend , 3 kids   Daughter has a copy of his living will Former Smoker Alcohol use-yes Drug use-no Diet-- healthy   Family History:   MI-- F (age 59s), M   colon ca--no   prostate ca--no   DM--no   Review of Systems Denies chest pain or shortness of breath Lower extremity edema at baseline. Occasional nasal congestion at night. Wonders if he could stop saw palmetto, currently without dysuria or gross hematuria     Objective:   Physical Exam General -- alert, well-developed. No apparent distress.  Lungs -- normal respiratory effort, no intercostal retractions, no accessory muscle use, and normal breath sounds.  Heart-- normal rate, regular rhythm, no murmur, and no gallop.  Extremities-- mild pitting edema, worse on the left  Neurologic-- alert & oriented X3 and strength normal in all extremities.  Psych-- Cognition and judgment appear intact. Alert and cooperative with normal attention span and concentration. not anxious appearing and not depressed appearing.      Assessment & Plan:

## 2012-03-26 NOTE — Assessment & Plan Note (Addendum)
Eye and feet  care discussed today. Check a A1c.

## 2012-03-26 NOTE — Assessment & Plan Note (Signed)
Well controlled with current meds, no change 

## 2012-03-26 NOTE — Assessment & Plan Note (Signed)
Wonders if he could stop saw palmetto, currently asymptomatic. I recommend him to discontinue it gradually and go back to it if he has any problems.

## 2012-03-28 ENCOUNTER — Encounter: Payer: Self-pay | Admitting: Internal Medicine

## 2012-05-17 LAB — HM DIABETES EYE EXAM

## 2012-05-19 ENCOUNTER — Telehealth: Payer: Self-pay | Admitting: Internal Medicine

## 2012-05-19 MED ORDER — NIFEDIPINE ER OSMOTIC RELEASE 60 MG PO TB24: 60.0000 mg | ORAL_TABLET | Freq: Every day | ORAL | Status: DC

## 2012-05-19 MED ORDER — SIMVASTATIN 40 MG PO TABS: 40.0000 mg | ORAL_TABLET | Freq: Every day | ORAL | Status: DC

## 2012-05-19 MED ORDER — BENAZEPRIL HCL 20 MG PO TABS: 20.0000 mg | ORAL_TABLET | Freq: Every day | ORAL | Status: DC

## 2012-05-19 NOTE — Telephone Encounter (Signed)
refills x 3 requesting 90-day supply on all   1-Benazepril HCl (Tab) 20 MG Take 1 tablet (20 mg total) by mouth daily.--last wrt 9.9.13 #90 x 1-refill  2-Simvastatin (Tab) 40 MG Take 1 tablet (40 mg total) by mouth at bedtime.--last wrt 9.9.13 #90 x 1-refil  3-NIFEdipine XL TAB 60 MG ER (2) Take 1 tablet (60 mg total) by mouth daily. --last wrt 3.20.13 #90 x 3-refills

## 2012-05-19 NOTE — Telephone Encounter (Signed)
Rx sent 

## 2012-05-24 ENCOUNTER — Encounter: Payer: Self-pay | Admitting: Internal Medicine

## 2012-06-02 ENCOUNTER — Telehealth: Payer: Self-pay | Admitting: Internal Medicine

## 2012-06-02 MED ORDER — OMEPRAZOLE 20 MG PO CPDR: 20.0000 mg | DELAYED_RELEASE_CAPSULE | Freq: Every day | ORAL | Status: DC

## 2012-06-02 NOTE — Telephone Encounter (Signed)
REFILLS NEEDED ON OMEPRAZOLE CAP 20 MG #90  SIG: TAKE 1 BY MOUTH DAILY

## 2012-06-02 NOTE — Telephone Encounter (Signed)
Refill done.  

## 2012-06-22 ENCOUNTER — Telehealth: Payer: Self-pay | Admitting: Internal Medicine

## 2012-06-22 MED ORDER — METOPROLOL SUCCINATE ER 50 MG PO TB24: 50.0000 mg | ORAL_TABLET | Freq: Every day | ORAL | Status: DC

## 2012-06-22 NOTE — Telephone Encounter (Signed)
Refill done.  

## 2012-06-22 NOTE — Telephone Encounter (Signed)
Metoprolol suc tab 50 mg er Qty: 90 Take 2 by mouth daily

## 2012-09-24 ENCOUNTER — Ambulatory Visit (INDEPENDENT_AMBULATORY_CARE_PROVIDER_SITE_OTHER): Payer: Medicare Other | Admitting: Internal Medicine

## 2012-09-24 ENCOUNTER — Encounter: Payer: Self-pay | Admitting: Internal Medicine

## 2012-09-24 VITALS — BP 140/90 | HR 75 | Temp 98.1°F | Ht 67.5 in | Wt 213.2 lb

## 2012-09-24 DIAGNOSIS — K219 Gastro-esophageal reflux disease without esophagitis: Secondary | ICD-10-CM

## 2012-09-24 DIAGNOSIS — N400 Enlarged prostate without lower urinary tract symptoms: Secondary | ICD-10-CM

## 2012-09-24 DIAGNOSIS — E119 Type 2 diabetes mellitus without complications: Secondary | ICD-10-CM

## 2012-09-24 DIAGNOSIS — I10 Essential (primary) hypertension: Secondary | ICD-10-CM

## 2012-09-24 DIAGNOSIS — E785 Hyperlipidemia, unspecified: Secondary | ICD-10-CM

## 2012-09-24 DIAGNOSIS — Z0000 Encounter for general adult medical examination without abnormal findings: Secondary | ICD-10-CM

## 2012-09-24 LAB — CBC WITH DIFFERENTIAL/PLATELET
Eosinophils Absolute: 0.1 10*3/uL (ref 0.0–0.7)
HCT: 39.5 % (ref 39.0–52.0)
Lymphs Abs: 2.2 10*3/uL (ref 0.7–4.0)
MCHC: 31.5 g/dL (ref 30.0–36.0)
MCV: 72.8 fl — ABNORMAL LOW (ref 78.0–100.0)
Monocytes Absolute: 0.6 10*3/uL (ref 0.1–1.0)
Neutrophils Relative %: 61.9 % (ref 43.0–77.0)
Platelets: 276 10*3/uL (ref 150.0–400.0)
RDW: 18.9 % — ABNORMAL HIGH (ref 11.5–14.6)

## 2012-09-24 LAB — COMPREHENSIVE METABOLIC PANEL
ALT: 17 U/L (ref 0–53)
AST: 16 U/L (ref 0–37)
Albumin: 3.5 g/dL (ref 3.5–5.2)
Alkaline Phosphatase: 57 U/L (ref 39–117)
Glucose, Bld: 100 mg/dL — ABNORMAL HIGH (ref 70–99)
Potassium: 4 mEq/L (ref 3.5–5.1)
Sodium: 138 mEq/L (ref 135–145)
Total Bilirubin: 0.4 mg/dL (ref 0.3–1.2)
Total Protein: 6.5 g/dL (ref 6.0–8.3)

## 2012-09-24 LAB — HEMOGLOBIN A1C: Hgb A1c MFr Bld: 6.7 % — ABNORMAL HIGH (ref 4.6–6.5)

## 2012-09-24 LAB — LIPID PANEL
Cholesterol: 154 mg/dL (ref 0–200)
VLDL: 26.2 mg/dL (ref 0.0–40.0)

## 2012-09-24 NOTE — Assessment & Plan Note (Addendum)
BPs mostly controlled but  occasionally goes up to 160. Plan: Monitor BPs, will check BP 5 times a week and call if  BP consistently goes > 160 otherwise no change,  f/u 6 months

## 2012-09-24 NOTE — Assessment & Plan Note (Signed)
On diet control, labs Sees eye doctor regularly

## 2012-09-24 NOTE — Assessment & Plan Note (Signed)
Controlled with PPIs

## 2012-09-24 NOTE — Assessment & Plan Note (Signed)
Last urology visit 06-2012, PSA ~ 13 Pt decided observation

## 2012-09-24 NOTE — Patient Instructions (Signed)

## 2012-09-24 NOTE — Progress Notes (Signed)
  Subjective:    Patient ID: KROSS SWALLOWS, male    DOB: Jun 20, 1934, 78 y.o.   MRN: [id number]  HPI Here for Medicare AWV:  1. Risk factors based on Past M, S, F history: reviewed  2. Physical Activities: rarely does some physical activity  3. Depression/mood: Neg screening   4. Hearing: No problems noted or reported  5. ADL's: Lives by himself has a GF, totally independent  6. Fall Risk: no recent falls, prevention discussed  7. home Safety: does feel safe at home  8. Height, weight, &visual acuity: see VS, uses glasses , slt decreased vision,had cataract surgery recently, vision not improved  9. Counseling: provided  10. Labs ordered based on risk factors: if needed  11. Referral Coordination: if needed  12. Care Plan, see assessment and plan  13. Cognitive Assessment:motor and cognition skills normal for age    In addition, today we discussed the following: Diabetes, on diet control only. He remains inactive by choice. Hypertension, good medication compliance, BP is mostly 120, 140. From time to time it goes to 160. GERD essentially asymptomatic. On PPIs. Increased PSA, note from urology reviewed, see assessment and plan.  Past Medical History: Hyperlipidemia Hypertension AODM   GERD h/o CALCULUS OF KIDNEY s/p R  percutaneus nephrostomy @ [facility] Hx of HYPERCALCEMIA ----> Parathyroidectomy (04/2008) Benign prostatic hypertrophy, f/u @ [facility], s/p Bx 2009 aprox: (-)   Asymmetric edema---> L leg larger than R , [place] neg for DVT 09-2010  Past Surgical History: Appendectomy eye surgery Tonsillectomy Parathyroidectomy (04/2008) R cataract 07-2012  Social History: Retired, single, has  a girlfriend , 3 kids  (one lives in town) Daughter has a copy of his living will Former Smoker Alcohol use-yes Drug use-no   Family History:   MI-- F (age 25s), M   colon ca--no   prostate ca--no   DM--no   Review of Systems Diet-- healthy Denies chest pain or shortness or  breath. lower extremity edema at baseline. No nausea, vomiting, diarrhea or blood in the stools. Urinary stream slightly weak but denies dysuria, gross hematuria or actual difficulty urinating.    Objective:   Physical Exam BP 140/90  Pulse 75  Temp(Src) 98.1 F (36.7 C) (Oral)  Ht 5' 7.5" (1.715 m)  Wt 213 lb 3.2 oz (96.707 kg)  BMI 32.88 kg/m2  SpO2 96%  General -- alert, well-developed, NAD.   Neck --no thyromegaly , normal carotid pulse Lungs -- normal respiratory effort, no intercostal retractions, no accessory muscle use, and normal breath sounds.   Heart-- normal rate, regular rhythm, no murmur, and no gallop.   Abdomen--soft, non-tender, no distention, no masses, no HSM, no guarding, and no rigidity.   Extremities-- +/+++ pretibial edema bilaterally Neurologic-- alert & oriented X3 and strength normal in all extremities. Psych-- Cognition and judgment appear intact. Alert and cooperative with normal attention span and concentration.  not anxious appearing and not depressed appearing.       Assessment & Plan:

## 2012-09-24 NOTE — Assessment & Plan Note (Addendum)
TD 2007 Pneumonia shot-- never had one, benefits explained before , again declined  zostavax-- had already  PSA--  Sees Dr Patsi Sears yearly  Cscope  03-2006, Bx adenomatous polyp (Dr Jarold Motto) Cscope again 12-2011--- no further scopes needed   Continue with his healthy diet, will not exercise (personal decision)

## 2012-09-24 NOTE — Assessment & Plan Note (Signed)
Good med compliance , labs  

## 2012-10-07 ENCOUNTER — Telehealth: Payer: Self-pay | Admitting: *Deleted

## 2012-10-07 ENCOUNTER — Other Ambulatory Visit (INDEPENDENT_AMBULATORY_CARE_PROVIDER_SITE_OTHER): Payer: Medicare Other

## 2012-10-07 DIAGNOSIS — D509 Iron deficiency anemia, unspecified: Secondary | ICD-10-CM

## 2012-10-07 DIAGNOSIS — D649 Anemia, unspecified: Secondary | ICD-10-CM

## 2012-10-07 LAB — IRON: Iron: 29 ug/dL — ABNORMAL LOW (ref 42–165)

## 2012-10-07 LAB — FERRITIN: Ferritin: 6.1 ng/mL — ABNORMAL LOW (ref 22.0–322.0)

## 2012-10-07 NOTE — Telephone Encounter (Signed)
Message copied by Shirlee More I on Thu Oct 07, 2012  3:46 PM ------      Message from: Willow Ora E      Created: Thu Oct 07, 2012  3:09 PM       Please enter a GI referral, dx  iron deficiency ------

## 2012-10-07 NOTE — Telephone Encounter (Signed)
Orders placed.

## 2012-10-08 ENCOUNTER — Telehealth: Payer: Self-pay | Admitting: Gastroenterology

## 2012-10-08 NOTE — Telephone Encounter (Signed)
Pt scheduled to see Mike Gip PA 10/11/12@9 :30am. Renee to notify pt of appt date and time.

## 2012-10-11 ENCOUNTER — Ambulatory Visit (INDEPENDENT_AMBULATORY_CARE_PROVIDER_SITE_OTHER): Payer: Medicare Other | Admitting: Physician Assistant

## 2012-10-11 ENCOUNTER — Telehealth: Payer: Self-pay | Admitting: *Deleted

## 2012-10-11 ENCOUNTER — Encounter: Payer: Self-pay | Admitting: Gastroenterology

## 2012-10-11 ENCOUNTER — Encounter: Payer: Self-pay | Admitting: Physician Assistant

## 2012-10-11 ENCOUNTER — Other Ambulatory Visit (INDEPENDENT_AMBULATORY_CARE_PROVIDER_SITE_OTHER): Payer: Medicare Other

## 2012-10-11 VITALS — BP 142/84 | HR 70 | Ht 67.5 in | Wt 211.0 lb

## 2012-10-11 DIAGNOSIS — D509 Iron deficiency anemia, unspecified: Secondary | ICD-10-CM

## 2012-10-11 DIAGNOSIS — Z860100 Personal history of colon polyps, unspecified: Secondary | ICD-10-CM

## 2012-10-11 LAB — CBC WITH DIFFERENTIAL/PLATELET
Basophils Absolute: 0 10*3/uL (ref 0.0–0.1)
Basophils Relative: 0.4 % (ref 0.0–3.0)
Eosinophils Absolute: 0.2 10*3/uL (ref 0.0–0.7)
Eosinophils Relative: 1.8 % (ref 0.0–5.0)
HCT: 39.7 % (ref 39.0–52.0)
Hemoglobin: 12.4 g/dL — ABNORMAL LOW (ref 13.0–17.0)
Lymphocytes Relative: 30.2 % (ref 12.0–46.0)
Lymphs Abs: 2.5 10*3/uL (ref 0.7–4.0)
MCHC: 31.3 g/dL (ref 30.0–36.0)
MCV: 72.5 fl — ABNORMAL LOW (ref 78.0–100.0)
Monocytes Absolute: 0.8 10*3/uL (ref 0.1–1.0)
Monocytes Relative: 9.3 % (ref 3.0–12.0)
Neutro Abs: 4.8 10*3/uL (ref 1.4–7.7)
Neutrophils Relative %: 58.3 % (ref 43.0–77.0)
Platelets: 270 10*3/uL (ref 150.0–400.0)
RBC: 5.48 Mil/uL (ref 4.22–5.81)
RDW: 19 % — ABNORMAL HIGH (ref 11.5–14.6)
WBC: 8.2 10*3/uL (ref 4.5–10.5)

## 2012-10-11 MED ORDER — FERROUS SULFATE 325 (65 FE) MG PO TABS: 325.0000 mg | ORAL_TABLET | Freq: Every day | ORAL | Status: DC

## 2012-10-11 MED ORDER — INTEGRA F 125-1 MG PO CAPS: 1.0000 | ORAL_CAPSULE | Freq: Every day | ORAL | Status: DC

## 2012-10-11 MED ORDER — FERROUS SULFATE 325 (65 FE) MG PO TABS: ORAL_TABLET | ORAL | Status: DC

## 2012-10-11 NOTE — Progress Notes (Signed)
Subjective:    Patient ID: Ronald [name], male    DOB: 04-18-1934, 78 y.o.   MRN: [id number]  HPI Ronald [name] is a pleasant 78 year old white male referred by Dr. Drue Novel for  evaluation of new finding of iron deficiency anemia. Patient is generally in good health he does have history of hypertension, hyperlipidemia and hyperparathyroidism . He has adult onset diabetes mellitus. He is known to Dr. Arlyce Dice from prior colonoscopy and treatment of internal hemorrhoids. He had colonoscopy in October of 2013 at that time had 4 small polyps removed all of which were hyperplastic. He was noted to have left colon diverticulosis and he also had banding of internal hemorrhoids x4. He had persistent hemorrhoidal discomfort and small amounts of rectal bleeding and then underwent flexible sigmoidoscopy in December of 2013 and had banding done again x4. Patient had a recent physical done and had labs showing a hemoglobin of 12.4 hematocrit of 39.5 and MCV of 72.8. Subsequent iron studies were done and show a serum iron of 29 ferritin of 6.1. Prior to that he CBC had been checked in July of 2012 and hemoglobin was 16.1 hematocrit of 48.2 at that time.  Patient currently has no GI complaints. He specifically denies any problems with heartburn indigestion dysphagia or diet aphasia. His appetite has been fine his weight has been stable , no abdominal pain ,no changes in his bowel habits which he attributes to his hemorrhoids . He does have chronic problems with constipation , having a bowel movement every 3-4 days  . He has no prior history of anemia or iron deficiency. Family history is negative for celiac disease as far as he is aware. He is not on any regular aspirin NSAIDs or blood thinners     Review of Systems  Constitutional: Negative.   HENT: Negative.   Eyes: Negative.   Respiratory: Negative.   Cardiovascular: Negative.   Gastrointestinal: Negative.   Endocrine: Negative.   Genitourinary: Negative.    Musculoskeletal: Negative.   Skin: Negative.   Allergic/Immunologic: Negative.   Neurological: Negative.   Hematological: Negative.   Psychiatric/Behavioral: Negative.    Outpatient Prescriptions Prior to Visit  Medication Sig Dispense Refill  . benazepril (LOTENSIN) 20 MG tablet Take 1 tablet (20 mg total) by mouth daily.  90 tablet  1  . cholecalciferol (VITAMIN D) 1000 UNITS tablet Take 1,000 Units by mouth daily.        . Magnesium 250 MG TABS Take by mouth daily.      . metoprolol succinate (TOPROL-XL) 50 MG 24 hr tablet Take 1 tablet (50 mg total) by mouth daily.  90 tablet  1  . NIFEdipine (NIFEDICAL XL) 60 MG 24 hr tablet Take 1 tablet (60 mg total) by mouth daily.  90 tablet  1  . omeprazole (PRILOSEC) 20 MG capsule Take 1 capsule (20 mg total) by mouth daily.  90 capsule  1  . saw palmetto 160 MG capsule Take 540 mg by mouth daily.       . simvastatin (ZOCOR) 40 MG tablet Take 1 tablet (40 mg total) by mouth at bedtime.  90 tablet  1   No facility-administered medications prior to visit.   Allergies  Allergen Reactions  . Cucumber Extract Nausea And Vomiting  . Sulfonamide Derivatives Other (See Comments)    "don't know reaction" per pt   Patient Active Problem List   Diagnosis Date Noted  . Personal history of colonic polyps 12/01/2011  . Internal hemorrhoids without mention of  complication 12/01/2011  . Lower extremity edema 09/23/2010  . BPH, elevated PSA 04/12/2008  . ERECTILE DYSFUNCTION 05/25/2007  . DM 03/02/2007  . CALCULUS OF KIDNEY 02/15/2007  . HYPERPARATHYROIDISM UNSPECIFIED 11/16/2006  . HYPERLIPIDEMIA 11/11/2006  . HYPERTENSION 11/11/2006  . GERD 11/11/2006   History   Social History Narrative  . No narrative on file      family history includes Heart attack in his mother and Heart attack (age of onset: 34) in his father.  There is no history of Colon cancer, and Prostate cancer, and Diabetes, .  Objective:   Physical Exam  well-developed  older white male in no acute distress. Blood pressure 142/84 pulse 70 height 5 foot 7 weight 211. HEENT ;nontraumatic normocephalic EOMI PERRLA sclera anicteric, Supple; no JVD, Cardiovascular; regular rate and rhythm with S1-S2 no murmur or gallop, Pulmonary; clear bilaterally, Abdome; soft nontender nondistended bowel sounds are active there is no palpable mass or hepatosplenic, Rectal ;exam scant stool in the rectal vault which is Hemoccult negative, Extremities; no clubbing cyanosis or edema skin warm and dry, Psych mood and affect normal and       Assessment & Plan:   #62 78 year old white male with new finding of iron deficiency anemia. Etiology is not clear, will need to rule out chronic intermittent GI loss. Stool is currently Hemoccult negative. He has had recent colonoscopy. He has not had any prior upper GI evaluation . Will need to rule out occult lesion, possible AVMs. He does have history of internal hemorrhoids which have been banded. He's not been having any significant bleeding and doubt as a cause for his iron deficiency  #2 diverticulosis #3 hyperplastic colon polyps last colonoscopy October 2013 #4 internal hemorrhoids-status post hemorrhoidal banding x2 #5 hyperparathyroidism #6 Hypertension #7 diabetes mellitus  Plan; Start Integra, one daily x3 months with plans to repeat iron studies in 3 months Repeat CBC today Schedule for upper endoscopy with small bowel biopsies with Dr. Arlyce Dice. Procedure discussed in detail with the patient and his family member and they are agreeable to proceed. If EGD is negative we'll need to consider capsule endoscopy and/or repeat colonoscopy

## 2012-10-11 NOTE — Patient Instructions (Addendum)
We sent a prescription to [facility] for the Integra Iron Supplements..  Samples given also.  Take 1 cap daily for 3 months. You have been scheduled for an endoscopy with propofol. Please follow written instructions given to you at your visit today. If you use inhalers (even only as needed), please bring them with you on the day of your procedure. Your physician has requested that you go to www.startemmi.com and enter the access code given to you at your visit today. This web site gives a general overview about your procedure. However, you should still follow specific instructions given to you by our office regarding your preparation for the procedure.

## 2012-10-11 NOTE — Telephone Encounter (Signed)
We sent a prescription to [facility] for the patient, Integra 125 mg.  [facility] would not cover it.  So I put in a prescription for Nuiron 150 mg and that was not on his formulary.  Amy Esterwood PA-C said for me to sent Ferrous Sulfate 325 mg . I informed the patient and asked him to let me know if he is notified from [facility] regarding that prescription.  Also, he had samples of Linzess 145 mcg he received from another MD.  He asked if he could take them to help with constipation especially with taking iron.  I asked Amy and she said that was fine, to take 1 tab daily.  He thanked me for our help.

## 2012-10-12 ENCOUNTER — Telehealth: Payer: Self-pay | Admitting: Physician Assistant

## 2012-10-12 NOTE — Telephone Encounter (Signed)
Pt was confused as to whether he should take Iron and have his EGD is HGB is normal. Discussed labs with pt just to inform him his HGB was stable for 2 weeks, but his iron levels remain low. He need the iron to keep his blood from dropping anymore and he needs the EGD to look for AVM's or other reasons for a slow bleed; pt stated understanding.

## 2012-10-13 ENCOUNTER — Other Ambulatory Visit: Payer: Self-pay

## 2012-10-17 NOTE — Progress Notes (Signed)
Reviewed and agree with management. Burley Kopka D. Jeshua Ransford, M.D., FACG  

## 2012-11-01 ENCOUNTER — Telehealth: Payer: Self-pay | Admitting: General Practice

## 2012-11-01 NOTE — Telephone Encounter (Signed)
Please call pt and let him know he still needs to go ahead for upper endoscopy with Dr. Arlyce Dice to sort out why he is iron deficient

## 2012-11-01 NOTE — Telephone Encounter (Signed)
Patient given Mike Gip, PA recommendations.

## 2012-11-01 NOTE — Telephone Encounter (Signed)
Message left on triage line: Pt called stating that he is scheduled for an Endoscopy on 9/5. States he recently started Iron pills. Pt would like to know if he needs to keep appt and complete Endoscopy or could he just have another Blood test through our lab to recheck iron levels. Please advise.

## 2012-11-01 NOTE — Telephone Encounter (Signed)
Will defer to GI

## 2012-11-04 ENCOUNTER — Other Ambulatory Visit: Payer: Medicare Other

## 2012-11-12 ENCOUNTER — Ambulatory Visit: Payer: Medicare Other | Admitting: Gastroenterology

## 2012-11-12 ENCOUNTER — Encounter: Payer: Medicare Other | Admitting: Gastroenterology

## 2012-11-15 ENCOUNTER — Other Ambulatory Visit: Payer: Self-pay | Admitting: *Deleted

## 2012-11-15 MED ORDER — BENAZEPRIL HCL 20 MG PO TABS: 20.0000 mg | ORAL_TABLET | Freq: Every day | ORAL | Status: DC

## 2012-11-15 MED ORDER — SIMVASTATIN 40 MG PO TABS: 40.0000 mg | ORAL_TABLET | Freq: Every day | ORAL | Status: DC

## 2012-11-15 NOTE — Telephone Encounter (Signed)
Rx refilled for benazepril and simvastatin.  Ag cma

## 2012-11-30 ENCOUNTER — Telehealth: Payer: Self-pay | Admitting: General Practice

## 2012-11-30 ENCOUNTER — Encounter (INDEPENDENT_AMBULATORY_CARE_PROVIDER_SITE_OTHER): Payer: Medicare Other

## 2012-11-30 ENCOUNTER — Telehealth: Payer: Self-pay | Admitting: Internal Medicine

## 2012-11-30 ENCOUNTER — Encounter: Payer: Self-pay | Admitting: Family Medicine

## 2012-11-30 ENCOUNTER — Ambulatory Visit (INDEPENDENT_AMBULATORY_CARE_PROVIDER_SITE_OTHER): Payer: Medicare Other | Admitting: Family Medicine

## 2012-11-30 VITALS — BP 132/88 | HR 70 | Temp 98.3°F | Wt 210.6 lb

## 2012-11-30 DIAGNOSIS — M79609 Pain in unspecified limb: Secondary | ICD-10-CM

## 2012-11-30 DIAGNOSIS — M79661 Pain in right lower leg: Secondary | ICD-10-CM

## 2012-11-30 DIAGNOSIS — I10 Essential (primary) hypertension: Secondary | ICD-10-CM

## 2012-11-30 MED ORDER — NIFEDIPINE ER OSMOTIC RELEASE 60 MG PO TB24: 60.0000 mg | ORAL_TABLET | Freq: Every day | ORAL | Status: DC

## 2012-11-30 NOTE — Telephone Encounter (Signed)
Refill: Nifedical xl tab 60 mg er (2). Take 1 by mouth daily. 90 day supply

## 2012-11-30 NOTE — Telephone Encounter (Signed)
Pt should take 81 mg asa daily and get a pair of diabetic socks to wear on plane--- it will give him some compression.  He should also try to get up and walk around on the plane--- at least pump feet

## 2012-11-30 NOTE — Telephone Encounter (Signed)
Received a call regarding pt's Venous doppler. Stated Negative for DVT. Pt aware.

## 2012-11-30 NOTE — Progress Notes (Signed)
  Subjective:    Patient ID: Ronald [name], male    DOB: June 04, 1934, 78 y.o.   MRN: [id number]  HPI Pt here c/o R calf pain for a long time now.  Pt's girlfriend is a retired Engineer, civil (consulting) and was worried it was a DVT.  The are leaving for [place] soon.   No cp, no sob.   Review of Systems As above    Objective:   Physical Exam  BP 132/88  Pulse 70  Temp(Src) 98.3 F (36.8 C) (Oral)  Wt 210 lb 9.6 oz (95.528 kg)  BMI 32.48 kg/m2  SpO2 98% General appearance: alert, cooperative, appears stated age and no distress Extremities: varicose veins noted and + swelling,  no pain with palpation      Assessment & Plan:

## 2012-11-30 NOTE — Assessment & Plan Note (Signed)
R/o dvt Pt traveling to [place] slowly--- suspicion for dvt is low  Doppler today

## 2012-11-30 NOTE — Telephone Encounter (Signed)
Pt.notified

## 2012-11-30 NOTE — Patient Instructions (Signed)
Deep Vein Thrombosis A deep vein thrombosis (DVT) is a blood clot that develops in a deep vein. A DVT is a clot in the deep, larger veins of the leg, arm, or pelvis. These are more dangerous than clots that might form in veins near the surface of the body. A DVT can lead to complications if the clot breaks off and travels in the bloodstream to the lungs.  A DVT can damage the valves in your leg veins, so that instead of flowing upwards, the blood pools in the lower leg. This is called post-thrombotic syndrome, and can result in pain, swelling, discoloration, and sores on the leg. Once identified, a DVT can be treated. It can also be prevented in some circumstances. Once you have had a DVT, you may be at increased risk for a DVT in the future. CAUSES Blood clots form in a vein for different reasons. Usually several things contribute to blood clots. Contributing factors include:  The flow of blood slows down.  The inside of the vein is damaged in some way.  The person has a condition that makes blood clot more easily. Some people are more likely than others to develop blood clots. That is because they have more factors that make clots likely. These are called risk factors. Risk factors include:   Older age, especially over 75 years old.  Having a history of blood clots. This means you have had one before. Or, it means that someone else in your family has had blood clots. You may have a genetic tendency to form clots.  Having major or lengthy surgery. This is especially true for surgery on the hip, knee, or belly (abdomen). Hip surgery is particularly high risk.  Breaking a hip or leg.  Sitting or lying still for a long time. This includes long distance travel, paralysis, or recovery from an illness or surgery.  Cancer, or cancer treatment.  Having a long, thin tube (catheter) placed inside a vein during a medical procedure.  Being overweight (obese).  Pregnancy and childbirth. Hormone  changes make the blood clot more easily during pregnancy. The fetus puts pressure on the veins of the pelvis. There is also risk of injury to veins during delivery or a caesarean. The risk is at its highest just after childbirth.  Medicines with the male hormone estrogen. This includes birth control pills and hormone replacement therapy.  Smoking.  Other circulation or heart problems. SYMPTOMS When a clot forms, it can either partially or totally block the blood flow in that vein. Symptoms of a DVT can include:  Swelling of the leg or arm, especially if one side is much worse.  Warmth and redness of the leg or arm, especially if one side is much worse.  Pain in an arm or leg. If the clot is in the leg, symptoms may be more noticeable or worse when standing or walking. The symptoms of a DVT that has traveled to the lungs (pulmonary embolism, PE) usually start suddenly, and include:  Shortness of breath.  Coughing.  Coughing up blood or blood-tinged phlegm.  Chest pain. The chest pain is often worse with deep breaths.  Rapid heartbeat. Anyone with these symptoms should get emergency medical treatment right away. Call your local emergency services (911 in [place] if you have these symptoms. DIAGNOSIS If a DVT is suspected, your caregiver will take a full medical history and carry out a physical exam. Tests that also may be required include:  Blood tests, including studies of   the clotting properties of the blood.  Ultrasonography to see if you have clots in your legs or lungs.  X-rays to show the flow of blood when dye is injected into the veins (venography).  Studies of your lungs, if you have any chest symptoms. PREVENTION  Exercise the legs regularly. Take a brisk 30 minute walk every day.  Maintain a weight that is appropriate for your height.  Avoid sitting or lying in bed for long periods of time without moving your legs.  Women, particularly those over the age of 35,  should consider the risks and benefits of taking estrogen medicines, including birth control pills.  Do not smoke, especially if you take estrogen medicines.  Long distance travel can increase your risk of DVT. You should exercise your legs by walking or pumping the muscles every hour.  In-hospital prevention:  Many of the risk factors above relate to situations that exist with hospitalization, either for illness, injury, or elective surgery.  Your caregiver will assess you for the need for venous thromboembolism prophylaxis when you are admitted to the hospital. If you are having surgery, your surgeon will assess you the day of or day after surgery.  Prevention may include medical and nonmedical measures. TREATMENT Treatment for DVT helps prevent death and disability. The most common treatment for DVT is blood thinning (anticoagulant) medicine, which reduces the blood's tendency to clot. Anticoagulants can stop new blood clots from forming and old ones from growing. They cannot dissolve existing clots. Your body does this by itself over time. Anticoagulants can be given by mouth, by intravenous (IV) access, or by injection. Your caregiver will determine the best program for you.  Heparin or related medicines (low molecular weight heparin) are usually the first treatment for a blood clot. They act quickly. However, they cannot be taken orally.  Heparin can cause a fall in a component of blood that stops bleeding and forms blood clots (platelets). You will be monitored with blood tests to be sure this does not occur.  Warfarin is an anticoagulant that can be swallowed (taken orally). It takes a few days to start working, so usually heparin or related medicines are used in combination. Once warfarin is working, heparin is usually stopped.  Less commonly, clot dissolving drugs (thrombolytics) are used to dissolve a DVT. They carry a high risk of bleeding, so they are used mainly in severe cases,  where a life or limb is threatened.  Very rarely, a blood clot in the leg needs to be removed surgically.  If you are unable to take anticoagulants, your caregiver may arrange for you to have a filter placed in a main vein in your belly (abdomen). This filter prevents clots from traveling to your lungs. HOME CARE INSTRUCTIONS  Take all medicines prescribed by your caregiver. Follow the directions carefully.  Warfarin. Most people will continue taking warfarin after hospital discharge. Your caregiver will advise you on the length of treatment (usually 3 6 months, sometimes lifelong).  Too much and too little warfarin are both dangerous. Too much warfarin increases the risk of bleeding. Too little warfarin continues to allow the risk for blood clots. While taking warfarin, you will need to have regular blood tests to measure your blood clotting time. These blood tests usually include both the prothrombin time (PT) and international normalized ratio (INR) tests. The PT and INR results allow your caregiver to adjust your dose of warfarin. The dose can change for many reasons. It is critically important that   you take warfarin exactly as prescribed, and that you have your PT and INR levels drawn exactly as directed.  Many foods, especially foods high in vitamin K can interfere with warfarin and affect the PT and INR results. Foods high in vitamin K include spinach, kale, broccoli, cabbage, collard and turnip greens, brussels sprouts, peas, cauliflower, seaweed, and parsley as well as beef and pork liver, green tea, and soybean oil. You should eat a consistent amount of foods high in vitamin K. Avoid major changes in your diet, or notify your caregiver before changing your diet. Arrange a visit with a dietitian to answer your questions.  Many medicines can interfere with warfarin and affect the PT and INR results. You must tell your caregiver about any and all medicines you take, this includes all vitamins  and supplements. Be especially cautious with aspirin and anti-inflammatory medicines. Ask your caregiver before taking these. Do not take or discontinue any prescribed or over-the-counter medicine except on the advice of your caregiver or pharmacist.  Warfarin can have side effects, primarily excessive bruising or bleeding. You will need to hold pressure over cuts for longer than usual. Your caregiver or pharmacist will discuss other potential side effects.  Alcohol can change the body's ability to handle warfarin. It is best to avoid alcoholic drinks or consume only very small amounts while taking warfarin. Notify your caregiver if you change your alcohol intake.  Notify your dentist or other caregivers before procedures.  Activity. Ask your caregiver how soon you can go back to normal activities. It is important to stay active to prevent blood clots. If you are on anticoagulant medicine, avoid contact sports.  Exercise. It is very important to exercise. This is especially important while traveling, sitting or standing for long periods of time. Exercise your legs by walking or by pumping the muscles frequently. Take frequent walks.  Compression stockings. These are tight elastic stockings that apply pressure to the lower legs. This pressure can help keep the blood in the legs from clotting. You may need to wear compressions stockings at home to help prevent a DVT.  Smoking. If you smoke, quit. Ask your caregiver for help with quitting smoking.  Learn as much as you can about DVT. Knowing more about the condition should help you keep it from coming back.  Wear a medical alert bracelet or carry a medical alert card. SEEK MEDICAL CARE IF:  You notice a rapid heartbeat.  You feel weaker or more tired than usual.  You feel faint.  You notice increased bruising.  You feel your symptoms are not getting better in the time expected.  You believe you are having side effects of medicine. SEEK  IMMEDIATE MEDICAL CARE IF:  You have chest pain.  You have trouble breathing.  You have new or increased swelling or pain in one leg.  You cough up blood.  You notice blood in vomit, in a bowel movement, or in urine. MAKE SURE YOU:  Understand these instructions.  Will watch your condition.  Will get help right away if you are not doing well or get worse. Document Released: 02/24/2005 Document Revised: 11/19/2011 Document Reviewed: 04/18/2010 [employer] Patient Information 2014 [employer].  

## 2012-11-30 NOTE — Telephone Encounter (Signed)
Patient presented to the office because he had not received a phone call back from the 2 messages he left on VM 11/29/2012. Patient is complaining of right calf pain x one week. Consulted with PCP and patient was scheduled to see Dr. Laury Axon at 11am today. Medication refill sent to pharmacy.

## 2012-12-31 ENCOUNTER — Telehealth: Payer: Self-pay | Admitting: *Deleted

## 2012-12-31 ENCOUNTER — Encounter: Payer: Self-pay | Admitting: Gastroenterology

## 2012-12-31 ENCOUNTER — Ambulatory Visit (AMBULATORY_SURGERY_CENTER): Payer: Medicare Other | Admitting: Gastroenterology

## 2012-12-31 VITALS — BP 122/78 | HR 62 | Temp 96.6°F | Resp 18 | Ht 67.5 in | Wt 211.0 lb

## 2012-12-31 DIAGNOSIS — D509 Iron deficiency anemia, unspecified: Secondary | ICD-10-CM

## 2012-12-31 DIAGNOSIS — D131 Benign neoplasm of stomach: Secondary | ICD-10-CM

## 2012-12-31 MED ORDER — SODIUM CHLORIDE 0.9 % IV SOLN: 500.0000 mL | INTRAVENOUS | Status: DC

## 2012-12-31 NOTE — Patient Instructions (Signed)
YOU HAD AN ENDOSCOPIC PROCEDURE TODAY AT THE [facility]: Refer to the procedure report that was given to you for any specific questions about what was found during the examination.  If the procedure report does not answer your questions, please call your gastroenterologist to clarify.  If you requested that your care partner not be given the details of your procedure findings, then the procedure report has been included in a sealed envelope for you to review at your convenience later.  YOU SHOULD EXPECT: Some feelings of bloating in the abdomen. Passage of more gas than usual.  Walking can help get rid of the air that was put into your GI tract during the procedure and reduce the bloating. If you had a lower endoscopy (such as a colonoscopy or flexible sigmoidoscopy) you may notice spotting of blood in your stool or on the toilet paper. If you underwent a bowel prep for your procedure, then you may not have a normal bowel movement for a few days.  DIET: Your first meal following the procedure should be a light meal and then it is ok to progress to your normal diet.  A half-sandwich or bowl of soup is an example of a good first meal.  Heavy or fried foods are harder to digest and may make you feel nauseous or bloated.  Likewise meals heavy in dairy and vegetables can cause extra gas to form and this can also increase the bloating.  Drink plenty of fluids but you should avoid alcoholic beverages for 24 hours.  ACTIVITY: Your care partner should take you home directly after the procedure.  You should plan to take it easy, moving slowly for the rest of the day.  You can resume normal activity the day after the procedure however you should NOT DRIVE or use heavy machinery for 24 hours (because of the sedation medicines used during the test).    SYMPTOMS TO REPORT IMMEDIATELY: A gastroenterologist can be reached at any hour.  During normal business hours, 8:30 AM to 5:00 PM Monday through Friday,  call [phone].  After hours and on weekends, please call the GI answering service at [phone] who will take a message and have the physician on call contact you.   Following upper endoscopy (EGD)  Vomiting of blood or coffee ground material  New chest pain or pain under the shoulder blades  Painful or persistently difficult swallowing  New shortness of breath  Fever of 100F or higher  Black, tarry-looking stools  FOLLOW UP: If any biopsies were taken you will be contacted by phone or by letter within the next 1-3 weeks.  Call your gastroenterologist if you have not heard about the biopsies in 3 weeks.  Our staff will call the home number listed on your records the next business day following your procedure to check on you and address any questions or concerns that you may have at that time regarding the information given to you following your procedure. This is a courtesy call and so if there is no answer at the home number and we have not heard from you through the emergency physician on call, we will assume that you have returned to your regular daily activities without incident.  SIGNATURES/CONFIDENTIALITY: You and/or your care partner have signed paperwork which will be entered into your electronic medical record.  These signatures attest to the fact that that the information above on your After Visit Summary has been reviewed and is understood.  Full responsibility   of the confidentiality of this discharge information lies with you and/or your care-partner.  Recommendations See procedure report 

## 2012-12-31 NOTE — Progress Notes (Signed)
Procedure ends, to recovery, report given and VSS. 

## 2012-12-31 NOTE — Telephone Encounter (Signed)
Pt had egd 12/31/12 with Dr Arlyce Dice .wants hospital egd scheduled to remove gastric polyps.

## 2012-12-31 NOTE — Progress Notes (Signed)
Called to room to assist during endoscopic procedure.  Patient ID and intended procedure confirmed with present staff. Received instructions for my participation in the procedure from the performing physician.  

## 2012-12-31 NOTE — Op Note (Signed)
[facility] [address] [place], [address]   ENDOSCOPY PROCEDURE REPORT  PATIENT: Ronald [name], Ronald [name]  MR#: [id number] BIRTHDATE: 12/03/1934 , 78  yrs. old GENDER: Male ENDOSCOPIST: Louis Meckel, MD REFERRED BY: PROCEDURE DATE:  12/31/2012 PROCEDURE:  EGD w/ biopsy ASA CLASS:     Class II INDICATIONS:  Iron deficiency anemia. MEDICATIONS: MAC sedation, administered by CRNA, Propofol (Diprivan) 120 mg IV, and Simethicone 0.6cc PO TOPICAL ANESTHETIC: Cetacaine Spray  DESCRIPTION OF PROCEDURE: After the risks benefits and alternatives of the procedure were thoroughly explained, informed consent was obtained.  The LB [id number] endoscope was introduced through the mouth and advanced to the third portion of the duodenum. Without limitations.  The instrument was slowly withdrawn as the mucosa was fully examined.      In the stomach there were multiple, friable polyps measuring from 5-25 mm..  Some polyps were pedunculated.  There was a small amount of spontaneous bleeding on several polyps.  Biopsies were taken. In the stomach there were multiple, friable polyps measuring from 5-25 mm..  Some polyps were pedunculated.  There was a small amount of spontaneous bleeding on several polyps.  Biopsies were taken. The remainder of the upper endoscopy exam was otherwise normal. Retroflexed views revealed no abnormalities.     The scope was then withdrawn from the patient and the procedure completed.  COMPLICATIONS: There were no complications. ENDOSCOPIC IMPRESSION: 1.   gastric polyposis  Findings to explain the source for iron deficiency anemia  RECOMMENDATIONS: endoscopy with removal of gastric polyps REPEAT EXAM:  eSigned:  Louis Meckel, MD 12/31/2012 1:54 PM   FA:OZHY Drue Novel, MD and Zelphia Cairo MD

## 2012-12-31 NOTE — Progress Notes (Signed)
Patient did not experience any of the following events: a burn prior to discharge; a fall within the facility; wrong site/side/patient/procedure/implant event; or a hospital transfer or hospital admission upon discharge from the facility. ([id number])  

## 2013-01-03 ENCOUNTER — Telehealth: Payer: Self-pay | Admitting: *Deleted

## 2013-01-03 ENCOUNTER — Other Ambulatory Visit: Payer: Self-pay | Admitting: *Deleted

## 2013-01-03 MED ORDER — OMEPRAZOLE 20 MG PO CPDR: 20.0000 mg | DELAYED_RELEASE_CAPSULE | Freq: Every day | ORAL | Status: DC

## 2013-01-03 NOTE — Telephone Encounter (Signed)
Error

## 2013-01-03 NOTE — Telephone Encounter (Signed)
No answer. Name identifier. Message left to call if any questions or concerns. 

## 2013-01-03 NOTE — Telephone Encounter (Deleted)
No answer. Name identifier. Message left to call if any questions or concerns. 

## 2013-01-03 NOTE — Telephone Encounter (Signed)
rx refilled per protocol. DJR  

## 2013-01-04 ENCOUNTER — Encounter (HOSPITAL_COMMUNITY): Payer: Self-pay | Admitting: Pharmacy Technician

## 2013-01-04 ENCOUNTER — Other Ambulatory Visit: Payer: Self-pay

## 2013-01-04 ENCOUNTER — Encounter: Payer: Self-pay | Admitting: Gastroenterology

## 2013-01-04 DIAGNOSIS — K317 Polyp of stomach and duodenum: Secondary | ICD-10-CM

## 2013-01-04 NOTE — Telephone Encounter (Signed)
Spoke with pt and he is aware of appt date and time. Pt knows to check in at [facility] at 11am. Pt to have no solid food after midnight and may have clear liquids until 8:30am. Pt aware.

## 2013-01-04 NOTE — Telephone Encounter (Signed)
Pt scheduled for EGD with APC at [facility] 01/17/13@12 :30pm. Left message for pt to call back.

## 2013-01-05 ENCOUNTER — Encounter (HOSPITAL_COMMUNITY): Payer: Self-pay | Admitting: *Deleted

## 2013-01-05 ENCOUNTER — Other Ambulatory Visit: Payer: Self-pay | Admitting: *Deleted

## 2013-01-05 MED ORDER — METOPROLOL SUCCINATE ER 50 MG PO TB24: 50.0000 mg | ORAL_TABLET | Freq: Every day | ORAL | Status: DC

## 2013-01-05 NOTE — Telephone Encounter (Signed)
Metoprolol refill sent to pharmacy

## 2013-01-13 ENCOUNTER — Other Ambulatory Visit: Payer: Self-pay

## 2013-01-13 ENCOUNTER — Telehealth: Payer: Self-pay | Admitting: Gastroenterology

## 2013-01-13 NOTE — Telephone Encounter (Signed)
Pt states he was called and told to hold some medications prior to his procedure at the hospital. Discussed with pt that we usually do not have pts hold meds unless they are blood thinners. Instructed pt to call the hospital and speak with the previsit nurse to see what he was told to hold. Pt verbalized understanding.

## 2013-01-17 ENCOUNTER — Encounter (HOSPITAL_COMMUNITY): Payer: Medicare Other | Admitting: Anesthesiology

## 2013-01-17 ENCOUNTER — Encounter (HOSPITAL_COMMUNITY): Payer: Self-pay | Admitting: *Deleted

## 2013-01-17 ENCOUNTER — Observation Stay (HOSPITAL_COMMUNITY)
Admission: RE | Admit: 2013-01-17 | Discharge: 2013-01-18 | Disposition: A | Discharge status: Home / Self Care | Payer: Medicare Other | Source: Ambulatory Visit | Attending: Gastroenterology | Admitting: Gastroenterology

## 2013-01-17 ENCOUNTER — Encounter (HOSPITAL_COMMUNITY)
Admission: RE | Disposition: A | Discharge status: Home / Self Care | Payer: Self-pay | Source: Ambulatory Visit | Attending: Gastroenterology

## 2013-01-17 ENCOUNTER — Ambulatory Visit (HOSPITAL_COMMUNITY): Payer: Medicare Other | Admitting: Anesthesiology

## 2013-01-17 DIAGNOSIS — K219 Gastro-esophageal reflux disease without esophagitis: Secondary | ICD-10-CM | POA: Insufficient documentation

## 2013-01-17 DIAGNOSIS — Z87891 Personal history of nicotine dependence: Secondary | ICD-10-CM | POA: Insufficient documentation

## 2013-01-17 DIAGNOSIS — K317 Polyp of stomach and duodenum: Secondary | ICD-10-CM

## 2013-01-17 DIAGNOSIS — D131 Benign neoplasm of stomach: Secondary | ICD-10-CM | POA: Insufficient documentation

## 2013-01-17 DIAGNOSIS — Z860100 Personal history of colon polyps, unspecified: Secondary | ICD-10-CM | POA: Insufficient documentation

## 2013-01-17 DIAGNOSIS — A048 Other specified bacterial intestinal infections: Secondary | ICD-10-CM | POA: Insufficient documentation

## 2013-01-17 DIAGNOSIS — D509 Iron deficiency anemia, unspecified: Secondary | ICD-10-CM

## 2013-01-17 DIAGNOSIS — D500 Iron deficiency anemia secondary to blood loss (chronic): Principal | ICD-10-CM | POA: Insufficient documentation

## 2013-01-17 DIAGNOSIS — E785 Hyperlipidemia, unspecified: Secondary | ICD-10-CM | POA: Insufficient documentation

## 2013-01-17 DIAGNOSIS — I10 Essential (primary) hypertension: Secondary | ICD-10-CM | POA: Insufficient documentation

## 2013-01-17 DIAGNOSIS — K921 Melena: Secondary | ICD-10-CM

## 2013-01-17 DIAGNOSIS — E213 Hyperparathyroidism, unspecified: Secondary | ICD-10-CM | POA: Insufficient documentation

## 2013-01-17 DIAGNOSIS — K5730 Diverticulosis of large intestine without perforation or abscess without bleeding: Secondary | ICD-10-CM | POA: Insufficient documentation

## 2013-01-17 DIAGNOSIS — E119 Type 2 diabetes mellitus without complications: Secondary | ICD-10-CM | POA: Insufficient documentation

## 2013-01-17 DIAGNOSIS — Z79899 Other long term (current) drug therapy: Secondary | ICD-10-CM | POA: Insufficient documentation

## 2013-01-17 HISTORY — PX: ESOPHAGOGASTRODUODENOSCOPY: SHX5428

## 2013-01-17 HISTORY — PX: HOT HEMOSTASIS: SHX5433

## 2013-01-17 LAB — CBC
HCT: 40.1 % (ref 39.0–52.0)
MCH: 24.8 pg — ABNORMAL LOW (ref 26.0–34.0)
MCHC: 31.9 g/dL (ref 30.0–36.0)
MCHC: 32.8 g/dL (ref 30.0–36.0)
MCV: 76.1 fL — ABNORMAL LOW (ref 78.0–100.0)
Platelets: 219 10*3/uL (ref 150–400)
Platelets: 235 10*3/uL (ref 150–400)
RBC: 5.27 MIL/uL (ref 4.22–5.81)
RDW: 18.2 % — ABNORMAL HIGH (ref 11.5–15.5)

## 2013-01-17 LAB — MRSA PCR SCREENING: MRSA by PCR: NEGATIVE

## 2013-01-17 SURGERY — EGD (ESOPHAGOGASTRODUODENOSCOPY)
Anesthesia: Monitor Anesthesia Care

## 2013-01-17 MED ORDER — BUTAMBEN-TETRACAINE-BENZOCAINE 2-2-14 % EX AERO
INHALATION_SPRAY | CUTANEOUS | Status: DC | PRN
  Administered 2013-01-17: 2 via TOPICAL

## 2013-01-17 MED ORDER — LACTATED RINGERS IV SOLN: INTRAVENOUS | Status: DC

## 2013-01-17 MED ORDER — ONDANSETRON HCL 4 MG PO TABS: 4.0000 mg | ORAL_TABLET | Freq: Four times a day (QID) | ORAL | Status: DC | PRN

## 2013-01-17 MED ORDER — PANTOPRAZOLE SODIUM 40 MG PO TBEC: 40.0000 mg | DELAYED_RELEASE_TABLET | Freq: Every day | ORAL | Status: DC

## 2013-01-17 MED ORDER — PROPOFOL INFUSION 10 MG/ML OPTIME
INTRAVENOUS | Status: DC | PRN
  Administered 2013-01-17: 75 ug/kg/min via INTRAVENOUS

## 2013-01-17 MED ORDER — HYDRALAZINE HCL 20 MG/ML IJ SOLN
10.0000 mg | Freq: Four times a day (QID) | INTRAMUSCULAR | Status: DC | PRN
  Administered 2013-01-17: 10 mg via INTRAVENOUS

## 2013-01-17 MED ORDER — HYDRALAZINE HCL 20 MG/ML IJ SOLN
INTRAMUSCULAR | Status: AC
  Filled 2013-01-17: qty 1

## 2013-01-17 MED ORDER — NIFEDIPINE ER 60 MG PO TB24
60.0000 mg | ORAL_TABLET | Freq: Every day | ORAL | Status: DC
  Filled 2013-01-17 (×2): qty 1

## 2013-01-17 MED ORDER — LIDOCAINE HCL (CARDIAC) 20 MG/ML IV SOLN
INTRAVENOUS | Status: DC | PRN
  Administered 2013-01-17: 30 mg via INTRAVENOUS

## 2013-01-17 MED ORDER — ONDANSETRON HCL 4 MG/2ML IJ SOLN: 4.0000 mg | Freq: Four times a day (QID) | INTRAMUSCULAR | Status: DC | PRN

## 2013-01-17 MED ORDER — GLYCOPYRROLATE 0.2 MG/ML IJ SOLN
INTRAMUSCULAR | Status: DC | PRN
  Administered 2013-01-17: 0.2 mg via INTRAVENOUS

## 2013-01-17 MED ORDER — ONDANSETRON HCL 4 MG/2ML IJ SOLN
INTRAMUSCULAR | Status: DC | PRN
  Administered 2013-01-17: 4 mg via INTRAVENOUS

## 2013-01-17 MED ORDER — SODIUM CHLORIDE 0.9 % IV SOLN
INTRAVENOUS | Status: DC
  Administered 2013-01-17: 17:00:00 via INTRAVENOUS

## 2013-01-17 MED ORDER — SODIUM CHLORIDE 0.9 % IV SOLN: INTRAVENOUS | Status: DC

## 2013-01-17 MED ORDER — FENTANYL CITRATE 0.05 MG/ML IJ SOLN: 25.0000 ug | INTRAMUSCULAR | Status: DC | PRN

## 2013-01-17 MED ORDER — KETAMINE HCL 10 MG/ML IJ SOLN
INTRAMUSCULAR | Status: DC | PRN
  Administered 2013-01-17: 20 mg via INTRAVENOUS

## 2013-01-17 MED ORDER — LACTATED RINGERS IV SOLN
INTRAVENOUS | Status: DC
  Administered 2013-01-17: 1000 mL via INTRAVENOUS
  Administered 2013-01-17: 14:00:00 via INTRAVENOUS

## 2013-01-17 MED ORDER — PROPOFOL 10 MG/ML IV BOLUS
INTRAVENOUS | Status: DC | PRN
  Administered 2013-01-17: 20 mg via INTRAVENOUS

## 2013-01-17 NOTE — Anesthesia Postprocedure Evaluation (Signed)
  Anesthesia Post-op Note  Patient: Ronald [name]  Procedure(s) Performed: Procedure(s) (LRB): ESOPHAGOGASTRODUODENOSCOPY (EGD) (N/A) HOT HEMOSTASIS (ARGON PLASMA COAGULATION/BICAP) (N/A)  Patient Location: PACU  Anesthesia Type: MAC  Level of Consciousness: awake and alert   Airway and Oxygen Therapy: Patient Spontanous Breathing  Post-op Pain: mild  Post-op Assessment: Post-op Vital signs reviewed, Patient's Cardiovascular Status Stable, Respiratory Function Stable, Patent Airway and No signs of Nausea or vomiting  Last Vitals:  Filed Vitals:   01/17/13 1400  BP:   Temp:   Resp: 23    Post-op Vital Signs: stable   Complications: No apparent anesthesia complications

## 2013-01-17 NOTE — Anesthesia Preprocedure Evaluation (Addendum)
Anesthesia Evaluation  Patient identified by MRN, date of birth, ID band Patient awake    Reviewed: Allergy & Precautions, H&P , NPO status , Patient's Chart, lab work & pertinent test results  Airway Mallampati: II TM Distance: >3 FB Neck ROM: full    Dental no notable dental hx. (+) Edentulous Upper, Dental Advisory Given and Missing Missing many lower front teeth:   Pulmonary neg pulmonary ROS, former smoker,  breath sounds clear to auscultation  Pulmonary exam normal       Cardiovascular Exercise Tolerance: Good hypertension, Pt. on medications Rhythm:regular Rate:Normal     Neuro/Psych negative neurological ROS  negative psych ROS   GI/Hepatic negative GI ROS, Neg liver ROS, GERD-  Medicated and Controlled,  Endo/Other  diabetesPre diabetic  Renal/GU negative Renal ROS  negative genitourinary   Musculoskeletal   Abdominal   Peds  Hematology negative hematology ROS (+)   Anesthesia Other Findings   Reproductive/Obstetrics negative OB ROS                          Anesthesia Physical Anesthesia Plan  ASA: II  Anesthesia Plan: MAC   Post-op Pain Management:    Induction:   Airway Management Planned:   Additional Equipment:   Intra-op Plan:   Post-operative Plan:   Informed Consent: I have reviewed the patients History and Physical, chart, labs and discussed the procedure including the risks, benefits and alternatives for the proposed anesthesia with the patient or authorized representative who has indicated his/her understanding and acceptance.   Dental Advisory Given  Plan Discussed with: CRNA and Surgeon  Anesthesia Plan Comments:         Anesthesia Quick Evaluation

## 2013-01-17 NOTE — H&P (Signed)
HPI Ronald [name] is a pleasant 78 year old white male referred by Dr. Drue Novel for evaluation of new finding of iron deficiency anemia. Patient is generally in good health he does have history of hypertension, hyperlipidemia and hyperparathyroidism . He has adult onset diabetes mellitus. He is known to Dr. Arlyce Dice from prior colonoscopy and treatment of internal hemorrhoids. He had colonoscopy in October of 2013 at that time had 4 small polyps removed all of which were hyperplastic. He was noted to have left colon diverticulosis and he also had banding of internal hemorrhoids x4. He had persistent hemorrhoidal discomfort and small amounts of rectal bleeding and then underwent flexible sigmoidoscopy in December of 2013 and had banding done again x4.  Patient had a recent physical done and had labs showing a hemoglobin of 12.4 hematocrit of 39.5 and MCV of 72.8. Subsequent iron studies were done and show a serum iron of 29 ferritin of 6.1.  Prior to that he CBC had been checked in July of 2012 and hemoglobin was 16.1 hematocrit of 48.2 at that time.  Patient currently has no GI complaints. He specifically denies any problems with heartburn indigestion dysphagia or diet aphasia. His appetite has been fine his weight has been stable , no abdominal pain ,no changes in his bowel habits which he attributes to his hemorrhoids . He does have chronic problems with constipation , having a bowel movement every 3-4 days .  He has no prior history of anemia or iron deficiency. Family history is negative for celiac disease as far as he is aware. He is not on any regular aspirin NSAIDs or blood thinners  Review of Systems  Constitutional: Negative.  HENT: Negative.  Eyes: Negative.  Respiratory: Negative.  Cardiovascular: Negative.  Gastrointestinal: Negative.  Endocrine: Negative.  Genitourinary: Negative.  Musculoskeletal: Negative.  Skin: Negative.  Allergic/Immunologic: Negative.  Neurological: Negative.  Hematological:  Negative.  Psychiatric/Behavioral: Negative.  Outpatient Prescriptions Prior to Visit   Medication  Sig  Dispense  Refill   .  benazepril (LOTENSIN) 20 MG tablet  Take 1 tablet (20 mg total) by mouth daily.  90 tablet  1   .  cholecalciferol (VITAMIN D) 1000 UNITS tablet  Take 1,000 Units by mouth daily.     .  Magnesium 250 MG TABS  Take by mouth daily.     .  metoprolol succinate (TOPROL-XL) 50 MG 24 hr tablet  Take 1 tablet (50 mg total) by mouth daily.  90 tablet  1   .  NIFEdipine (NIFEDICAL XL) 60 MG 24 hr tablet  Take 1 tablet (60 mg total) by mouth daily.  90 tablet  1   .  omeprazole (PRILOSEC) 20 MG capsule  Take 1 capsule (20 mg total) by mouth daily.  90 capsule  1   .  saw palmetto 160 MG capsule  Take 540 mg by mouth daily.     .  simvastatin (ZOCOR) 40 MG tablet  Take 1 tablet (40 mg total) by mouth at bedtime.  90 tablet  1    No facility-administered medications prior to visit.    Allergies   Allergen  Reactions   .  Cucumber Extract  Nausea And Vomiting   .  Sulfonamide Derivatives  Other (See Comments)     "don't know reaction" per pt    Patient Active Problem List    Diagnosis  Date Noted   .  Personal history of colonic polyps  12/01/2011   .  Internal hemorrhoids without mention of complication  12/01/2011   .  Lower extremity edema  09/23/2010   .  BPH, elevated PSA  04/12/2008   .  ERECTILE DYSFUNCTION  05/25/2007   .  DM  03/02/2007   .  CALCULUS OF KIDNEY  02/15/2007   .  HYPERPARATHYROIDISM UNSPECIFIED  11/16/2006   .  HYPERLIPIDEMIA  11/11/2006   .  HYPERTENSION  11/11/2006   .  GERD  11/11/2006    History    Social History Narrative   .  No narrative on file   family history includes Heart attack in his mother and Heart attack (age of onset: 14) in his father. There is no history of Colon cancer, and Prostate cancer, and Diabetes, .  Objective:   Physical Exam well-developed older white male in no acute distress. Blood pressure 142/84 pulse 70  height 5 foot 7 weight 211. HEENT ;nontraumatic normocephalic EOMI PERRLA sclera anicteric, Supple; no JVD, Cardiovascular; regular rate and rhythm with S1-S2 no murmur or gallop, Pulmonary; clear bilaterally, Abdome; soft nontender nondistended bowel sounds are active there is no palpable mass or hepatosplenic, Rectal ;exam scant stool in the rectal vault which is Hemoccult negative, Extremities; no clubbing cyanosis or edema skin warm and dry, Psych mood and affect normal and  Assessment & Plan:   #10 78 year old white male with new finding of iron deficiency anemia. Etiology is not clear, will need to rule out chronic intermittent GI loss. Stool is currently Hemoccult negative. He has had recent colonoscopy. He has not had any prior upper GI evaluation . Will need to rule out occult lesion, possible AVMs. He does have history of internal hemorrhoids which have been banded. He's not been having any significant bleeding and doubt as a cause for his iron deficiency  #2 diverticulosis  #3 hyperplastic colon polyps last colonoscopy October 2013  #4 internal hemorrhoids-status post hemorrhoidal banding x2  #5 hyperparathyroidism  #6 Hypertension  #7 diabetes mellitus  Plan; Start Integra, one daily x3 months with plans to repeat iron studies in 3 months  Repeat CBC today  Schedule for upper endoscopy/enteroscopy  with small bowel biopsies with Dr. Arlyce Dice. Procedure discussed in detail with the patient and his family member and they are agreeable to proceed.  If EGD is negative we'll need to consider capsule endoscopy and/or repeat colonoscopy

## 2013-01-17 NOTE — Plan of Care (Signed)
Problem: Phase I Progression Outcomes Goal: Initial discharge plan identified Outcome: Progressing Plan is for Discharge tomorrow.  Observation status.

## 2013-01-17 NOTE — H&P (Signed)
Admission Note  Primary Care Physician:  Willow Ora, MD Primary Gastroenterologist:   [name] MD  HPI: Ronald [name] is a 78 y.o. male known to Dr. Arlyce Dice.  In October 2013 patient had a colonoscopy with polypectomy and banding of internal hemorrhoids. Polyps were all hyperplastic. He had flexible sigmoidoscopy December 2013 with repeat hemorrhoid banding.  He was evaluated in our office early August 2014 for iron deficiency anemia. Hgb was 16 in mid July, down to 12.5 in August. MCV has been steadily declining since July, down to 72 at last check 10/11/12. For evaluation of anemia patient underwent EGD on 12/31/12 with findings of multiple, friable gastric polyps. Patient was scheduled for repeat EGD with APC which he underwent today. Again, multiple gastric polyps were seen, several bleeding. Several bands were placed over the bleeding polyps. A very large pedunculated polyp was removed piecemeal with hot snare. Despite epinephrine injection there was significant bleeding (arterial) from the stalk. Hemostasis finally achieved after placement of several endoscopic clips and additional epinephrine. Patient admitted to ICU for observation post-procedure  Past Medical History  Diagnosis Date  . GERD (gastroesophageal reflux disease)   . Hyperlipidemia   . Hypertension   . Hypercalcemia     parathyroidectomy 04/2008  . BPH (benign prostatic hypertrophy)     f/u @ Biopsy, s/p bx 2009 apro (-)  . Hemorrhoids   . Complication of anesthesia     violent upon waking up  . Calculus of kidney     s/p R percutaneus nephrostomy @ [facility]  . Diabetes mellitus     pre diabetic  . Iron deficiency anemia   . Colon polyp   . Diverticulosis   . Cataract     Past Surgical History  Procedure Laterality Date  . Appendectomy    . Tonsillectomy    . Parathyroidectomy  04/2008  . Kidney stone surgery    . Colonoscopy  12/16/2011    Procedure: COLONOSCOPY;  Surgeon: Louis Meckel, MD;  Location:  [facility] ENDOSCOPY;  Service: Endoscopy;  Laterality: N/A;  . Flexible sigmoidoscopy  02/09/2012    Procedure: FLEXIBLE SIGMOIDOSCOPY;  Surgeon: Louis Meckel, MD;  Location: [facility] ENDOSCOPY;  Service: Endoscopy;  Laterality: N/A;  . Hemorrhoid banding  02/09/2012    Procedure: HEMORRHOID BANDING;  Surgeon: Louis Meckel, MD;  Location: [facility] ENDOSCOPY;  Service: Endoscopy;  Laterality: N/A;  . Cataract extraction Right   . Eye surgery  infant    "lazy eye"    Prior to Admission medications   Medication Sig Start Date End Date Taking? Authorizing Provider  benazepril (LOTENSIN) 20 MG tablet Take 1 tablet (20 mg total) by mouth daily. 11/15/12  Yes Wanda Plump, MD  cholecalciferol (VITAMIN D) 1000 UNITS tablet Take 1,000 Units by mouth daily.   Yes Historical Provider, MD  metoprolol succinate (TOPROL-XL) 50 MG 24 hr tablet Take 1 tablet (50 mg total) by mouth daily. 01/05/13  Yes Wanda Plump, MD  NIFEdipine (NIFEDICAL XL) 60 MG 24 hr tablet Take 1 tablet (60 mg total) by mouth daily. 11/30/12  Yes Wanda Plump, MD  omeprazole (PRILOSEC) 20 MG capsule Take 20 mg by mouth daily. 01/03/13  Yes Wanda Plump, MD  saw palmetto 160 MG capsule Take 540 mg by mouth daily.    Yes Historical Provider, MD  simvastatin (ZOCOR) 40 MG tablet Take 1 tablet (40 mg total) by mouth at bedtime. 11/15/12  Yes Wanda Plump, MD  No current facility-administered medications for this encounter.    Allergies as of 01/04/2013 - Review Complete 01/04/2013  Allergen Reaction Noted  . Cucumber extract Nausea And Vomiting 10/06/2011  . Sulfonamide derivatives Other (See Comments) 02/09/2007    Family History  Problem Relation Age of Onset  . Heart attack Father 50  . Heart attack Mother   . Colon cancer Neg Hx   . Prostate cancer Neg Hx   . Diabetes Neg Hx     History   Social History  . Marital Status: Widowed    Spouse Name: N/A    Number of Children: 3  . Years of Education: N/A   Occupational History  . retired      Social History Main Topics  . Smoking status: Former Smoker -- 25 years    Types: Cigarettes    Quit date: 11/30/1981  . Smokeless tobacco: Never Used  . Alcohol Use: 4.2 oz/week    7 Glasses of wine per week     Comment: 1 glass of wine or beer with dinner  . Drug Use: No  . Sexual Activity: Not on file   Review of Systems: All systems reviewed an negative except where noted in HPI.  Physical Exam: Vital signs in last 24 hours: Temp:  [98.1 F (36.7 C)] 98.1 F (36.7 C) (11/10 1123) Resp:  [6-23] 20 (11/10 1450) BP: (113-156)/(75-91) 155/91 mmHg (11/10 1450) SpO2:  [94 %-97 %] 96 % (11/10 1450)   Exam done by Dr. Arlyce Dice today: Physical Exam well-developed older white male in no acute distress. Blood pressure 142/84 pulse 70 height 5 foot 7 weight 211. HEENT ;nontraumatic normocephalic EOMI PERRLA sclera anicteric, Supple; no JVD, Cardiovascular; regular rate and rhythm with S1-S2 no murmur or gallop, Pulmonary; clear bilaterally, Abdome; soft nontender nondistended bowel sounds are active there is no palpable mass or hepatosplenic, Rectal ;exam scant stool in the rectal vault which is Hemoccult negative, Extremities; no clubbing cyanosis or edema skin warm and dry, Psych mood and affect normal and   Impression / Plan:  31. 78 year old male with new iron deficiency anemia. Suspect chronic GI blood loss from multiple friable polyps.   2. Acute upper GI bleed. Patient had bleeding from gastric polyps, including one large pedunculated gastric polyp removed during EGD today. Hemostasis achieved with endoscopic clips, epinephrine injection. Patient will be admitted for observation. Will monitor hgb, keep on clears.  If no further bleeding will likely be discharged in am.  3. HTN. He took all home meds except for Nifedipine which I will order for him. DBP is running on high side right now.     LOS: 0 days   Willette Cluster  01/17/2013, 2:52 PM  Chart was reviewed and patient was  examined. X-rays and lab were reviewed.    I agree with management and plans.  Barbette Hair. Arlyce Dice, M.D., [facility]astro[facility] Cell [phone]

## 2013-01-17 NOTE — Op Note (Signed)
[facility] [address] [place], [address]   ENDOSCOPY PROCEDURE REPORT  PATIENT: Ronald, [name]  MR#: [id number] BIRTHDATE: 05/21/1934 , 78  yrs. old GENDER: Male ENDOSCOPIST: Louis Meckel, MD REFERRED BY:  Willow Ora, M.D. PROCEDURE DATE:  01/17/2013 PROCEDURE:  EGD w/ control of bleeding , EGD w/ directed submucosal injection(s), any substance, EGD w/ snare technique , and EGD w/ band ligation of polyps ASA CLASS:     Class II INDICATIONS:  iron deficiency anemia secondary to bleeding gastric polyps. MEDICATIONS: MAC sedation, administered by CRNA TOPICAL ANESTHETIC:  DESCRIPTION OF PROCEDURE: After the risks benefits and alternatives of the procedure were thoroughly explained, informed consent was obtained.  The    endoscope was introduced through the mouth and advanced to the third portion of the duodenum. Without limitations. The instrument was slowly withdrawn as the mucosa was fully examined.      In the gastric fundus and body there were multiple polyps measuring from 5-10 mm.  Several had fresh blood at the tip.  Endoscopic bands were placed over each of the bleeding polyps, totaling 7 bands placed.  In the gastric antrum again was identified a pedunculated 2-1/2-3 cm friable polyp.  Attempts were made to place an Endoloop over the polyp to wrap the stalk but were unsuccessful. The stalk of the polyp was injected with a total of 9 cc 1:10,000 sodium epinephrine.  The polyp was removed piecemeal with a hot polypectomy snare in its entirety.  Following polypectomy patient developed bleeding from the stalk.  There was active pumping consistent with bleeding from an arterial vessel.  Four endoscopic clips were applied and another 5 cc of epinephrine was injected. Hemostasis was achieved.  The largest polyp was removed and submitted to pathology.   In the gastric fundus and body there were multiple polyps measuring from 5-10 mm.  Several had  fresh blood at the tip.  Endoscopic bands were placed over each of the bleeding polyps, totaling 7 bands placed.  In the gastric antrum again was identified a pedunculated 2-1/2-3 cm friable polyp.  Attempts were made to place an Endoloop over the polyp to wrap the stalk but were unsuccessful.  The stalk of the polyp was injected with a total of 9 cc 1:10,000 sodium epinephrine.  The polyp was removed piecemeal with a hot polypectomy snare in its entirety.  Following polypectomy patient developed bleeding from the stalk.  There was active pumping consistent with bleeding from an arterial vessel. Four endoscopic clips were applied and another 5 cc of epinephrine was injected.  Hemostasis was achieved.  The largest polyp was removed and submitted to pathology.   The remainder of the upper endoscopy exam was otherwise normal.  Retroflexed views revealed no abnormalities.     The scope was then withdrawn from the patient and the procedure completed.  COMPLICATIONS: There were no complications. ENDOSCOPIC IMPRESSION: 1.  bleeding gastric polyps  RECOMMENDATIONS: admit for observation because of post polypectomy bleeding REPEAT EXAM:  eSigned:  Louis Meckel, MD 01/17/2013 2:16 PM   CC:  PATIENT NAME:  Ronald, [name] MR#: [id number]

## 2013-01-17 NOTE — Progress Notes (Signed)
The patient underwent endoscopy with band ligation of several small bleeding gastric polyps.  The large, bleeding pedunculated polyp in the gastric antrum was removed piecemeal following injection of the stalk with epinephrine.  Despite this he had moderate arterial bleeding post polypectomy requiring endoscopic clip placement and further injections with epinephrine.  Hemostasis was achieved.  Patient is admitted for observation because of increased risk for GI bleeding.

## 2013-01-17 NOTE — Transfer of Care (Signed)
Immediate Anesthesia Transfer of Care Note  Patient: Ronald [name]  Procedure(s) Performed: Procedure(s) (LRB): ESOPHAGOGASTRODUODENOSCOPY (EGD) (N/A) HOT HEMOSTASIS (ARGON PLASMA COAGULATION/BICAP) (N/A)  Patient Location: PACU  Anesthesia Type: MAC  Level of Consciousness: sedated, patient cooperative and responds to stimulation  Airway & Oxygen Therapy: Patient Spontanous Breathing and Patient connected to face mask oxgen  Post-op Assessment: Report given to PACU RN and Post -op Vital signs reviewed and stable  Post vital signs: Reviewed and stable  Complications: No apparent anesthesia complications

## 2013-01-18 ENCOUNTER — Encounter (HOSPITAL_COMMUNITY): Payer: Self-pay | Admitting: Gastroenterology

## 2013-01-18 ENCOUNTER — Other Ambulatory Visit: Payer: Self-pay

## 2013-01-18 LAB — BASIC METABOLIC PANEL
BUN: 16 mg/dL (ref 6–23)
CO2: 26 mEq/L (ref 19–32)
Calcium: 8.4 mg/dL (ref 8.4–10.5)
Creatinine, Ser: 1.09 mg/dL (ref 0.50–1.35)
GFR calc Af Amer: 74 mL/min — ABNORMAL LOW (ref >90)
GFR calc non Af Amer: 64 mL/min — ABNORMAL LOW (ref >90)

## 2013-01-18 LAB — CBC
HCT: 37.5 % — ABNORMAL LOW (ref 39.0–52.0)
MCV: 75.6 fL — ABNORMAL LOW (ref 78.0–100.0)
Platelets: 243 10*3/uL (ref 150–400)
RDW: 18.3 % — ABNORMAL HIGH (ref 11.5–15.5)
WBC: 9 10*3/uL (ref 4.0–10.5)

## 2013-01-18 MED ORDER — AMOXICILL-CLARITHRO-LANSOPRAZ PO MISC: Freq: Two times a day (BID) | ORAL | Status: DC

## 2013-01-18 NOTE — Discharge Summary (Signed)
Discharge Summary  Name: Ronald [name] MRN: [id number] DOB: 04/06/1934 78 y.o. PCP:  Willow Ora, MD  Date of Admission: 01/17/2013 10:51 AM Date of Discharge: 01/18/2013 Primary Gastroenterologist: [name], MD Discharging Physician: Valrie Hart, MD  Discharge Diagnosis: 1. Upper GI bleed, low volume, Resolved.   2. Microcytic anemia, stable. Likely related to chronic GI blood loss from gastric polyps.   3. Hypertension  Consultations: none   GI Procedures:  EGD with treatment of friable gastric polyps and removal of a large gastric polyp with subsequent hemostasis.   History/Physical Exam:  See Admission H&P  Admission HPI: Per Dr. Marlowe Shores Ronald [name] is a 78 y.o. male known to Dr. Arlyce Dice. In October 2013 patient had a colonoscopy with polypectomy and banding of internal hemorrhoids. Polyps were all hyperplastic. He had flexible sigmoidoscopy December 2013 with repeat hemorrhoid banding. He was evaluated in our office early August 2014 for iron deficiency anemia. Hgb was 16 in mid July, down to 12.5 in August. MCV has been steadily declining since July, down to 72 at last check 10/11/12. For evaluation of anemia patient underwent EGD on 12/31/12 with findings of multiple, friable gastric polyps. Patient was scheduled for repeat EGD with APC which he underwent today. Again, multiple gastric polyps were seen, several bleeding. Several bands were placed over the bleeding polyps. A very large pedunculated polyp was removed piecemeal with hot snare. Despite epinephrine injection there was significant bleeding (arterial) from the stalk. Hemostasis finally achieved after placement of several endoscopic clips and additional epinephrine. Patient admitted to ICU for observation post-procedure   Hospital Course by problem list:  1. Upper GI bleed. Patient has known gastric polyps. On the morning of admission he underwent EGD with intentions to cauterize the bleeding gastric polyps.  Multiple polyps were banded. There was a large pedunculated gastric polyp which was removed in a piecemeal fashion. Despite epinephrine injection there was significant bleeding from the stalk. After additional epinephrine and endoscopic clip placement the bleeding ceased. Patient was admitted for post-procedure observation. Fortunately he had no overt bleeding overnight. The next morning his hgb was down only one gram from admission (13.2  >> 12.2). He was discharged home in stable condition with a follow up appointment to see Dr. Arlyce Dice. Polyp biopsies pending at time of discharge.   2. Microcytic anemia. See #1. Stable.   3. HTN. DBP was around 100 post-procedure. Home antihypertensives restarted post-procedure with significant improvement in blood pressure.   Discharge Vitals:  BP 139/84  Pulse 61  Temp(Src) 98.7 F (37.1 C) (Oral)  Resp 13  Ht 5' 7.5" (1.715 m)  Wt 211 lb 13.8 oz (96.1 kg)  BMI 32.67 kg/m2  SpO2 97%  Physical Exam General: pleasant white male in NAD Cardiac:  RRR. Abdominal:  Soft, nondistended, nontender Extremity: No edema Neuro: Alert and oriented. No gross neurological deficits. Psych: Pleasant and cooperative  Discharge Labs:  Results for orders placed during the hospital encounter of 01/17/13 (from the past 24 hour(s))  CBC     Status: Abnormal   Collection Time    01/17/13  3:31 PM      Result Value Range   WBC 11.8 (*) 4.0 - 10.5 K/uL   RBC 5.27  4.22 - 5.81 MIL/uL   Hemoglobin 12.8 (*) 13.0 - 17.0 g/dL   HCT 40.9  81.1 - 91.4 %   MCV 76.1 (*) 78.0 - 100.0 fL   MCH 24.3 (*) 26.0 - 34.0 pg  MCHC 31.9  30.0 - 36.0 g/dL   RDW 40.9 (*) 81.1 - 91.4 %   Platelets 219  150 - 400 K/uL  MRSA PCR SCREENING     Status: None   Collection Time    01/17/13  4:00 PM      Result Value Range   MRSA by PCR NEGATIVE  NEGATIVE  CBC     Status: Abnormal   Collection Time    01/17/13 10:35 PM      Result Value Range   WBC 10.6 (*) 4.0 - 10.5 K/uL   RBC 5.32   4.22 - 5.81 MIL/uL   Hemoglobin 13.2  13.0 - 17.0 g/dL   HCT 78.2  95.6 - 21.3 %   MCV 75.6 (*) 78.0 - 100.0 fL   MCH 24.8 (*) 26.0 - 34.0 pg   MCHC 32.8  30.0 - 36.0 g/dL   RDW 08.6 (*) 57.8 - 46.9 %   Platelets 235  150 - 400 K/uL  BASIC METABOLIC PANEL     Status: Abnormal   Collection Time    01/18/13  3:20 AM      Result Value Range   Sodium 139  135 - 145 mEq/L   Potassium 3.5  3.5 - 5.1 mEq/L   Chloride 105  96 - 112 mEq/L   CO2 26  19 - 32 mEq/L   Glucose, Bld 96  70 - 99 mg/dL   BUN 16  6 - 23 mg/dL   Creatinine, Ser 6.29  0.50 - 1.35 mg/dL   Calcium 8.4  8.4 - 52.8 mg/dL   GFR calc non Af Amer 64 (*) >90 mL/min   GFR calc Af Amer 74 (*) >90 mL/min  CBC     Status: Abnormal   Collection Time    01/18/13  3:20 AM      Result Value Range   WBC 9.0  4.0 - 10.5 K/uL   RBC 4.96  4.22 - 5.81 MIL/uL   Hemoglobin 12.2 (*) 13.0 - 17.0 g/dL   HCT 41.3 (*) 24.4 - 01.0 %   MCV 75.6 (*) 78.0 - 100.0 fL   MCH 24.6 (*) 26.0 - 34.0 pg   MCHC 32.5  30.0 - 36.0 g/dL   RDW 27.2 (*) 53.6 - 64.4 %   Platelets 243  150 - 400 K/uL    Disposition and follow-up:   Ronald [name] was discharged from [facility] in stable condition.    Follow-up Appointments: Discharge Orders   Future Appointments Provider Department Dept Phone   02/18/2013 9:45 AM Louis Meckel, MD [facility] [phone]   03/29/2013 9:15 AM Wanda Plump, MD [facility]  [place] [phone]   Future Orders Complete By Expires   Resume previous diet  As directed       Discharge Medications:   Medication List         benazepril 20 MG tablet  Commonly known as:  LOTENSIN  Take 1 tablet (20 mg total) by mouth daily.     cholecalciferol 1000 UNITS tablet  Commonly known as:  VITAMIN D  Take 1,000 Units by mouth daily.     metoprolol succinate 50 MG 24 hr tablet  Commonly known as:  TOPROL-XL  Take 1 tablet (50 mg total) by mouth daily.      NIFEdipine 60 MG 24 hr tablet  Commonly known as:  NIFEDICAL XL  Take 1 tablet (60 mg total) by mouth daily.  omeprazole 20 MG capsule  Commonly known as:  PRILOSEC  Take 20 mg by mouth daily.     saw palmetto 160 MG capsule  Take 540 mg by mouth daily.     simvastatin 40 MG tablet  Commonly known as:  ZOCOR  Take 1 tablet (40 mg total) by mouth at bedtime.        Signed: Willette Cluster 01/18/2013, 10:21 AM

## 2013-01-20 NOTE — Progress Notes (Addendum)
During procedure with Dr Arlyce Dice "EGD" patient noted to have  several very large polyps in his stomach .Base of largest polyp injected with 9cc 1:10000  Prior to polypectomy. Polyp removed with  Hot snare . Base of polyp began bleeding after polypectomy.To control bleeding  1:10,000 epinepherine injected  ,then endo clips then placed and bleeding stopped .

## 2013-01-24 NOTE — Discharge Summary (Signed)
I have personally taken an interval history, reviewed the chart, and examined the patient.  I agree with the extender's note, impression and recommendations.  Robert D. Kaplan, MD, FACG [facility] [phone]  

## 2013-02-01 ENCOUNTER — Telehealth: Payer: Self-pay | Admitting: Gastroenterology

## 2013-02-01 NOTE — Telephone Encounter (Signed)
Pt aware.

## 2013-02-01 NOTE — Telephone Encounter (Signed)
He took a [employer] fast streamer.  He should resume omeprazole

## 2013-02-01 NOTE — Telephone Encounter (Signed)
Pt states he has finished taking the "cocktail" he was given to take. Asked pt what the name of the drug was and he states "Sandose". Wants to know if he needs to resume Prilosec. Please advise.

## 2013-02-18 ENCOUNTER — Ambulatory Visit (INDEPENDENT_AMBULATORY_CARE_PROVIDER_SITE_OTHER): Payer: Medicare Other | Admitting: Gastroenterology

## 2013-02-18 ENCOUNTER — Encounter: Payer: Self-pay | Admitting: Gastroenterology

## 2013-02-18 ENCOUNTER — Other Ambulatory Visit (INDEPENDENT_AMBULATORY_CARE_PROVIDER_SITE_OTHER): Payer: Medicare Other

## 2013-02-18 VITALS — BP 138/60 | HR 84 | Ht 66.25 in | Wt 207.1 lb

## 2013-02-18 DIAGNOSIS — D131 Benign neoplasm of stomach: Secondary | ICD-10-CM

## 2013-02-18 DIAGNOSIS — D649 Anemia, unspecified: Secondary | ICD-10-CM

## 2013-02-18 DIAGNOSIS — D509 Iron deficiency anemia, unspecified: Secondary | ICD-10-CM

## 2013-02-18 LAB — CBC WITH DIFFERENTIAL/PLATELET
Basophils Absolute: 0 10*3/uL (ref 0.0–0.1)
Basophils Relative: 0.3 % (ref 0.0–3.0)
Eosinophils Relative: 3.4 % (ref 0.0–5.0)
Hemoglobin: 13.3 g/dL (ref 13.0–17.0)
Lymphocytes Relative: 31.5 % (ref 12.0–46.0)
Monocytes Relative: 10.2 % (ref 3.0–12.0)
Neutro Abs: 3.9 10*3/uL (ref 1.4–7.7)
RBC: 5.31 Mil/uL (ref 4.22–5.81)
RDW: 19 % — ABNORMAL HIGH (ref 11.5–14.6)
WBC: 7.2 10*3/uL (ref 4.5–10.5)

## 2013-02-18 NOTE — Progress Notes (Signed)
          History of Present Illness:  Ronald [name] has returned following gastric polypectomy.  He was admitted post procedure for 24-hour observation because of bleeding initially associated with the polypectomy.  He has felt well since this time has no GI complaints.  He thought he was losing weight but, according to our scales, weight is stable.    Review of Systems: Pertinent positive and negative review of systems were noted in the above HPI section. All other review of systems were otherwise negative.    Current Medications, Allergies, Past Medical History, Past Surgical History, Family History and Social History were reviewed in [employer] electronic medical record  Vital signs were reviewed in today's medical record. Physical Exam: General: Well developed , well nourished, no acute distress

## 2013-02-18 NOTE — Assessment & Plan Note (Signed)
Status post polypectomy.  This likely was the source for chronic GI blood loss.

## 2013-02-18 NOTE — Patient Instructions (Signed)
Go to the basement for labs 

## 2013-02-18 NOTE — Assessment & Plan Note (Signed)
Secondary to chronic bleeding from a large hyperplastic gastric polyp.  Status post polypectomy.  Recommendations #1 CBC

## 2013-02-22 ENCOUNTER — Telehealth: Payer: Self-pay

## 2013-02-22 DIAGNOSIS — D649 Anemia, unspecified: Secondary | ICD-10-CM

## 2013-02-22 NOTE — Telephone Encounter (Signed)
Spoke with pt and he is aware. Order in epic and reminder entered.

## 2013-02-22 NOTE — Telephone Encounter (Signed)
If an iron level was not drawn how could you call me to tell me yesterday that I coulkd stop taking my iron pills ? Also since this whole thing started with my showing an anemic level indicating that I had a possible internal bleeding problem why wasn't an iron level drawn ? By the way the amount of characters in this responce allows for 400 characters. I have now used 376. ----- Message ----- From: Nurse Glade Stanford H Sent: 02/22/2013 11:30 AM EST To: Margit Banda Bina Subject: [id number] There was only a CBC drawn at this time, not and Iron level. ----- Message ----- From: Cherylynn Ridges Sent: 02/22/2013 11:24 AM EST To: [name], MD Subject: [id number] my latest CBC does not show my current iron level . can you email me this information please. ----- Message ----- From: Nurse Glade Stanford H Sent: 02/21/2013 10:42 AM EST To: Margit Banda Deisher Subject: [id number] Mr. Youngblood, I am so sorry you had problems filling this out. To be honest I have no idea how many characters the computer will allow you to use. I would like to give you the Patient Experience phone number to call. You may express positive or negative experiences by calling this number. Please call [phone] to voice your experience and then you will not have to worry about running out of characters. Sincerely, Selinda Michaels RN ----- Message ----- From: Cherylynn Ridges Sent: 02/20/2013 12:03 PM EST To: [name], MD Subject: [id number] Tried to reply but was told by computer too many characters . How many characters are allowed to expresss an unhappy experiance ?     Dr. Arlyce Dice please see the pts comments above. He wants to know why we told him to stop taking his iron yesterday if we did not draw an iron level?

## 2013-02-22 NOTE — Telephone Encounter (Signed)
He was reevaluated for a microcytic anemia, with Fe studies c/w iron deficiency anemia.  EGD was diagnostic for a chronic GI bleeding source - the large gastric polyp, which was removed.  Since polypectomy Hg has increased from 12.2 to 13.3, which is normal.  Based on rise to normal Hg levels, I don't think Fe supplementation is any longer required.  We are treating the Hg level, not an Fe level.  Would repeat Hg in 3 months.

## 2013-03-29 ENCOUNTER — Ambulatory Visit (INDEPENDENT_AMBULATORY_CARE_PROVIDER_SITE_OTHER): Payer: Medicare HMO | Admitting: Internal Medicine

## 2013-03-29 ENCOUNTER — Encounter: Payer: Self-pay | Admitting: Internal Medicine

## 2013-03-29 VITALS — BP 126/78 | HR 79 | Temp 98.0°F | Wt 205.0 lb

## 2013-03-29 DIAGNOSIS — D509 Iron deficiency anemia, unspecified: Secondary | ICD-10-CM

## 2013-03-29 DIAGNOSIS — I10 Essential (primary) hypertension: Secondary | ICD-10-CM

## 2013-03-29 DIAGNOSIS — E119 Type 2 diabetes mellitus without complications: Secondary | ICD-10-CM

## 2013-03-29 LAB — IRON: Iron: 49 ug/dL (ref 42–165)

## 2013-03-29 LAB — HEMOGLOBIN: Hemoglobin: 14.1 g/dL (ref 13.0–17.0)

## 2013-03-29 LAB — HEMOGLOBIN A1C: Hgb A1c MFr Bld: 6.3 % (ref 4.6–6.5)

## 2013-03-29 LAB — FERRITIN: FERRITIN: 6.9 ng/mL — AB (ref 22.0–322.0)

## 2013-03-29 MED ORDER — BENAZEPRIL HCL 20 MG PO TABS: 20.0000 mg | ORAL_TABLET | Freq: Every day | ORAL | Status: DC

## 2013-03-29 MED ORDER — METOPROLOL SUCCINATE ER 50 MG PO TB24: 50.0000 mg | ORAL_TABLET | Freq: Every day | ORAL | Status: DC

## 2013-03-29 MED ORDER — OMEPRAZOLE 20 MG PO CPDR: 20.0000 mg | DELAYED_RELEASE_CAPSULE | Freq: Every day | ORAL | Status: DC

## 2013-03-29 MED ORDER — SIMVASTATIN 40 MG PO TABS: 40.0000 mg | ORAL_TABLET | Freq: Every day | ORAL | Status: DC

## 2013-03-29 MED ORDER — NIFEDIPINE ER OSMOTIC RELEASE 60 MG PO TB24: 60.0000 mg | ORAL_TABLET | Freq: Every day | ORAL | Status: DC

## 2013-03-29 NOTE — Patient Instructions (Signed)
Get your blood work before you leave   Next visit is for a physical exam in 6 months , fasting Please make an appointment    Take a multivitamin every day Check your blood pressure regularly if it is more than 140 / 85 please let us know  Diabetes and Foot Care Diabetes may cause you to have problems because of poor blood supply (circulation) to your feet and legs. This may cause the skin on your feet to become thinner, break easier, and heal more slowly. Your skin may become dry, and the skin may peel and crack. You may also have nerve damage in your legs and feet causing decreased feeling in them. You may not notice minor injuries to your feet that could lead to infections or more serious problems. Taking care of your feet is one of the most important things you can do for yourself.  HOME CARE INSTRUCTIONS  Wear shoes at all times, even in the house. Do not go barefoot. Bare feet are easily injured.  Check your feet daily for blisters, cuts, and redness. If you cannot see the bottom of your feet, use a mirror or ask someone for help.  Wash your feet with warm water (do not use hot water) and mild soap. Then pat your feet and the areas between your toes until they are completely dry. Do not soak your feet as this can dry your skin.  Apply a moisturizing lotion or petroleum jelly (that does not contain alcohol and is unscented) to the skin on your feet and to dry, brittle toenails. Do not apply lotion between your toes.  Trim your toenails straight across. Do not dig under them or around the cuticle. File the edges of your nails with an emery board or nail file.  Do not cut corns or calluses or try to remove them with medicine.  Wear clean socks or stockings every day. Make sure they are not too tight. Do not wear knee-high stockings since they may decrease blood flow to your legs.  Wear shoes that fit properly and have enough cushioning. To break in new shoes, wear them for just a few  hours a day. This prevents you from injuring your feet. Always look in your shoes before you put them on to be sure there are no objects inside.  Do not cross your legs. This may decrease the blood flow to your feet.  If you find a minor scrape, cut, or break in the skin on your feet, keep it and the skin around it clean and dry. These areas may be cleansed with mild soap and water. Do not cleanse the area with peroxide, alcohol, or iodine.  When you remove an adhesive bandage, be sure not to damage the skin around it.  If you have a wound, look at it several times a day to make sure it is healing.  Do not use heating pads or hot water bottles. They may burn your skin. If you have lost feeling in your feet or legs, you may not know it is happening until it is too late.  Make sure your health care provider performs a complete foot exam at least annually or more often if you have foot problems. Report any cuts, sores, or bruises to your health care provider immediately. SEEK MEDICAL CARE IF:   You have an injury that is not healing.  You have cuts or breaks in the skin.  You have an ingrown nail.  You notice redness  on your legs or feet.  You feel burning or tingling in your legs or feet.  You have pain or cramps in your legs and feet.  Your legs or feet are numb.  Your feet always feel cold. SEEK IMMEDIATE MEDICAL CARE IF:   There is increasing redness, swelling, or pain in or around a wound.  There is a red line that goes up your leg.  Pus is coming from a wound.  You develop a fever or as directed by your health care provider.  You notice a bad smell coming from an ulcer or wound. Document Released: 02/22/2000 Document Revised: 10/27/2012 Document Reviewed: 08/03/2012 [facility] Patient Information 2014 [place].

## 2013-03-29 NOTE — Assessment & Plan Note (Signed)
BP today is very good, ambulatory BPs always less than 140 Last BMP normal. Plan: No change

## 2013-03-29 NOTE — Assessment & Plan Note (Addendum)
Status post GI eval, anemia felt to be d/t stomach polyp that was removed, was recommended PPIs. Self d/c  iron supplementation. Plan:  Continue PPIs for 2 or 3 more months, does not have a history of GERD Check hemoglobin, iron, ferritin Multivitamins daily

## 2013-03-29 NOTE — Progress Notes (Signed)
Pre visit review using our clinic review tool, if applicable. No additional management support is needed unless otherwise documented below in the visit note. 

## 2013-03-29 NOTE — Progress Notes (Signed)
Subjective:    Patient ID: Ronald [name], male    DOB: Jan 06, 1935, 79 y.o.   MRN: [id number]  HPI ROV Anemia-status post GI workup, last hemoglobin improving, pt decided to stop iron supplementation Diabetes--no ambulatory blood sugars. Hypertension--good medication compliance, needs multiple refills, ambulatory BPs always less than 140.   Past Medical History  Diagnosis Date  . GERD (gastroesophageal reflux disease)   . Hyperlipidemia   . Hypertension   . Hypercalcemia     parathyroidectomy 04/2008  . BPH (benign prostatic hypertrophy)     f/u @ Biopsy, s/p bx 2009 apro (-)  . Hemorrhoids   . Complication of anesthesia     violent upon waking up  . Calculus of kidney     s/p R percutaneus nephrostomy @ [facility]  . Diabetes mellitus     pre diabetic  . Iron deficiency anemia   . Colon polyp   . Diverticulosis   . Cataract   . Edema leg     asymetric , L > R , [place] neg for DVT 09-2010   Past Surgical History  Procedure Laterality Date  . Appendectomy    . Tonsillectomy    . Parathyroidectomy  04/2008  . Kidney stone surgery    . Colonoscopy  12/16/2011    Procedure: COLONOSCOPY;  Surgeon: Inda Castle, MD;  Location: [facility];  Service: Endoscopy;  Laterality: N/A;  . Flexible sigmoidoscopy  02/09/2012    Procedure: FLEXIBLE SIGMOIDOSCOPY;  Surgeon: Inda Castle, MD;  Location: [facility];  Service: Endoscopy;  Laterality: N/A;  . Hemorrhoid banding  02/09/2012    Procedure: HEMORRHOID BANDING;  Surgeon: Inda Castle, MD;  Location: [facility];  Service: Endoscopy;  Laterality: N/A;  . Cataract extraction Right   . Eye surgery  infant    "lazy eye"  . Esophagogastroduodenoscopy N/A 01/17/2013    Procedure: ESOPHAGOGASTRODUODENOSCOPY (EGD);  Surgeon: Inda Castle, MD;  Location: [facility];  Service: Endoscopy;  Laterality: N/A;  . Hot hemostasis N/A 01/17/2013    Procedure: HOT HEMOSTASIS (ARGON PLASMA COAGULATION/BICAP);  Surgeon: Inda Castle, MD;  Location: [facility];  Service: Endoscopy;  Laterality: N/A;      History   Social History  . Marital Status: Widowed    Spouse Name: N/A    Number of Children: 3  . Years of Education: N/A   Occupational History  . retired     Social History Main Topics  . Smoking status: Former Smoker -- 25 years    Types: Cigarettes    Quit date: 11/30/1981  . Smokeless tobacco: Never Used  . Alcohol Use: 4.2 oz/week    7 Glasses of wine per week     Comment: 1 glass of wine or beer with dinner  . Drug Use: No  . Sexual Activity: Not on file   Other Topics Concern  . Not on file   Social History Narrative   Retired, single, has  a girlfriend , 3 kids  (one lives in town)   Daughter has a copy of his living will     Review of Systems Diet-- regular  Exercise-- not very active  No  CP, SOB   No GERD  Sx. No blood in stools No dysphagia or odynophagia no dysuria, gross hematuria; some difficulty urinating at baseline Denies lower extremity paresthesias        Objective:   Physical Exam BP 126/78  Pulse 79  Temp(Src) 98 F (36.7 C) (Oral)  Wt 205 lb (92.987 kg)  SpO2 96%  General -- alert, well-developed, NAD.  Lungs -- normal respiratory effort, no intercostal retractions, no accessory muscle use, and normal breath sounds.  Heart-- normal rate, regular rhythm, no murmur.  DIABETIC FEET EXAM: No lower extremity edema Normal pedal pulses bilaterally Skin normal, nails normal, no calluses Pinprick examination of the feet normal. Neurologic--  alert & oriented X3.   Psych-- Cognition and judgment appear intact. Cooperative with normal attention span and concentration. No anxious or depressed appearing.     Assessment & Plan:

## 2013-03-29 NOTE — Assessment & Plan Note (Signed)
On diet control Feet exam normal today,  no neuropathy. Labs

## 2013-03-30 ENCOUNTER — Telehealth: Payer: Self-pay | Admitting: *Deleted

## 2013-03-30 NOTE — Telephone Encounter (Signed)
Called and spoke with patient and suggested that he ask his pharmacist which multivitamin he could take that didn't have calcium in it. He verbalized understanding. JG//CMA

## 2013-03-30 NOTE — Telephone Encounter (Signed)
If he has problems with calcium, triy to get a multivitamin w/o calcium

## 2013-03-30 NOTE — Telephone Encounter (Signed)
Patient called and stated that he went and bought a multivitamin yesterday as recommended. He states that it has 220 mg of calcium. He states that he had a problem taking calcium before and if he should still take the multivitamin. Please advise.

## 2013-04-01 ENCOUNTER — Telehealth: Payer: Self-pay

## 2013-04-01 NOTE — Telephone Encounter (Signed)
Relevant patient education assigned to patient using Emmi. ° °

## 2013-04-13 ENCOUNTER — Telehealth: Payer: Self-pay | Admitting: Internal Medicine

## 2013-04-13 NOTE — Telephone Encounter (Signed)
Relevant patient education mailed to patient.  

## 2013-09-28 ENCOUNTER — Encounter: Payer: Self-pay | Admitting: Internal Medicine

## 2013-09-28 ENCOUNTER — Ambulatory Visit (INDEPENDENT_AMBULATORY_CARE_PROVIDER_SITE_OTHER): Payer: Medicare HMO | Admitting: Internal Medicine

## 2013-09-28 VITALS — BP 125/84 | HR 87 | Temp 97.9°F | Ht 68.0 in | Wt 207.0 lb

## 2013-09-28 DIAGNOSIS — I10 Essential (primary) hypertension: Secondary | ICD-10-CM

## 2013-09-28 DIAGNOSIS — N400 Enlarged prostate without lower urinary tract symptoms: Secondary | ICD-10-CM

## 2013-09-28 DIAGNOSIS — D131 Benign neoplasm of stomach: Secondary | ICD-10-CM

## 2013-09-28 DIAGNOSIS — E785 Hyperlipidemia, unspecified: Secondary | ICD-10-CM

## 2013-09-28 DIAGNOSIS — K219 Gastro-esophageal reflux disease without esophagitis: Secondary | ICD-10-CM

## 2013-09-28 DIAGNOSIS — Z0000 Encounter for general adult medical examination without abnormal findings: Secondary | ICD-10-CM

## 2013-09-28 DIAGNOSIS — E119 Type 2 diabetes mellitus without complications: Secondary | ICD-10-CM

## 2013-09-28 LAB — BASIC METABOLIC PANEL
BUN: 27 mg/dL — ABNORMAL HIGH (ref 6–23)
CALCIUM: 8.8 mg/dL (ref 8.4–10.5)
CO2: 28 mEq/L (ref 19–32)
Chloride: 106 mEq/L (ref 96–112)
Creatinine, Ser: 1.1 mg/dL (ref 0.4–1.5)
GFR: 67.55 mL/min (ref >60.00)
Glucose, Bld: 104 mg/dL — ABNORMAL HIGH (ref 70–99)
Potassium: 3.8 mEq/L (ref 3.5–5.1)
SODIUM: 141 meq/L (ref 135–145)

## 2013-09-28 LAB — AST: AST: 16 U/L (ref 0–37)

## 2013-09-28 LAB — LIPID PANEL
Cholesterol: 139 mg/dL (ref 0–200)
HDL: 38.4 mg/dL — ABNORMAL LOW (ref >39.00)
LDL Cholesterol: 73 mg/dL (ref 0–99)
NONHDL: 100.6
Total CHOL/HDL Ratio: 4
Triglycerides: 139 mg/dL (ref 0.0–149.0)
VLDL: 27.8 mg/dL (ref 0.0–40.0)

## 2013-09-28 LAB — ALT: ALT: 16 U/L (ref 0–53)

## 2013-09-28 LAB — HEMOGLOBIN: HEMOGLOBIN: 15.5 g/dL (ref 13.0–17.0)

## 2013-09-28 LAB — TSH: TSH: 0.35 u[IU]/mL (ref 0.35–4.50)

## 2013-09-28 NOTE — Assessment & Plan Note (Addendum)
TD 2007 Pneumonia shot-- again declined  zostavax-- had already Cscope  03-2006, Bx adenomatous polyp (Dr Sharlett Iles) [place] again 12-2011--- no further scopes needed  Continue with his healthy diet, will not exercise (personal decision)

## 2013-09-28 NOTE — Assessment & Plan Note (Signed)
Due for labs

## 2013-09-28 NOTE — Assessment & Plan Note (Signed)
Classroom visit 2013, PSA was elevated, they elected observation. Pt  remains asymptomatic, does not plan to see urology this year, will reassess the situation 2016

## 2013-09-28 NOTE — Progress Notes (Signed)
Subjective:    Patient ID: Ronald [name], male    DOB: 1934-07-30, 79 y.o.   MRN: [id number]  DOS:  09/28/2013 Type of visit - description:    Here for Medicare AWV:   1. Risk factors based on Past M, S, F history: reviewed   2. Physical Activities: rarely does some physical activity   3. Depression/mood: Neg screening    4. Hearing: No problems noted or reported   5. ADL's: Lives by himself has a girlfriend, totally independent   38. Fall Risk: no recent falls, prevention discussed   7. home Safety: does feel safe at home   8. Height, weight, &visual acuity: see VS, uses glasses; ,had cataract surgery on R  vision not improved   9. Counseling: provided   10. Labs ordered based on risk factors: if needed   11. Referral Coordination: if needed   12. Care Plan, see assessment and plan   13. Cognitive Assessment:motor and cognition skills normal for age    In addition, today we discussed the following: Hyperlipidemia, good compliance with medications, no apparent side effects  Hypertension. Taking medications correctly, occasionally takes his blood pressure usually normal GI. On PPIs, no side effects, no symptoms. Increased PSA. Last urology note reviewed, see assessment and plan  ROS Denies chest pain, difficulty breathing or palpitations Note nausea, vomiting, diarrhea or blood in the stools No cough, sputum or wheezing. No dysuria or gross hematuria   Past Medical History  Diagnosis Date  . GERD (gastroesophageal reflux disease)   . Hyperlipidemia   . Hypertension   . Hypercalcemia     parathyroidectomy 04/2008  . BPH (benign prostatic hypertrophy)     f/u @ Biopsy, s/p bx 2009 apro (-)  . Hemorrhoids   . Complication of anesthesia     violent upon waking up  . Calculus of kidney     s/p R percutaneus nephrostomy @ [facility]  . Diabetes mellitus     pre diabetic  . Iron deficiency anemia   . Colon polyp   . Diverticulosis   . Cataract   . Edema leg    asymetric , L > R , [place] neg for DVT 09-2010    Past Surgical History  Procedure Laterality Date  . Appendectomy    . Tonsillectomy    . Parathyroidectomy  04/2008  . Kidney stone surgery    . Colonoscopy  12/16/2011    Procedure: COLONOSCOPY;  Surgeon: Inda Castle, MD;  Location: [facility];  Service: Endoscopy;  Laterality: N/A;  . Flexible sigmoidoscopy  02/09/2012    Procedure: FLEXIBLE SIGMOIDOSCOPY;  Surgeon: Inda Castle, MD;  Location: [facility];  Service: Endoscopy;  Laterality: N/A;  . Hemorrhoid banding  02/09/2012    Procedure: HEMORRHOID BANDING;  Surgeon: Inda Castle, MD;  Location: [facility];  Service: Endoscopy;  Laterality: N/A;  . Cataract extraction Right   . Eye surgery  infant    "lazy eye"  . Esophagogastroduodenoscopy N/A 01/17/2013    Procedure: ESOPHAGOGASTRODUODENOSCOPY (EGD);  Surgeon: Inda Castle, MD;  Location: [facility];  Service: Endoscopy;  Laterality: N/A;  . Hot hemostasis N/A 01/17/2013    Procedure: HOT HEMOSTASIS (ARGON PLASMA COAGULATION/BICAP);  Surgeon: Inda Castle, MD;  Location: [facility];  Service: Endoscopy;  Laterality: N/A;    History   Social History  . Marital Status: Widowed    Spouse Name: N/A    Number of Children: 3  . Years of Education: N/A  Occupational History  . retired     Social History Main Topics  . Smoking status: Former Smoker -- 25 years    Types: Cigarettes    Quit date: 11/30/1981  . Smokeless tobacco: Never Used  . Alcohol Use: 4.2 oz/week    7 Glasses of wine per week     Comment: 1 glass of wine or beer with dinner  . Drug Use: No  . Sexual Activity: Not on file   Other Topics Concern  . Not on file   Social History Narrative   Retired, single, has  a girlfriend , 3 children  (one lives in town)   Daughter has a copy of his living will     Family History  Problem Relation Age of Onset  . Heart attack Father 24  . Heart attack Mother   . Colon cancer Neg Hx   .  Prostate cancer Neg Hx   . Diabetes Neg Hx        Medication List       This list is accurate as of: 09/28/13 11:59 PM.  Always use your most recent med list.               benazepril 20 MG tablet  Commonly known as:  LOTENSIN  Take 1 tablet (20 mg total) by mouth daily.     cholecalciferol 1000 UNITS tablet  Commonly known as:  VITAMIN D  Take 1,000 Units by mouth daily.     esomeprazole 20 MG capsule  Commonly known as:  NEXIUM  Take 20 mg by mouth daily at 12 noon.     metoprolol succinate 50 MG 24 hr tablet  Commonly known as:  TOPROL-XL  Take 1 tablet (50 mg total) by mouth daily.     NIFEdipine 60 MG 24 hr tablet  Commonly known as:  NIFEDICAL XL  Take 1 tablet (60 mg total) by mouth daily.     saw palmetto 160 MG capsule  Take 540 mg by mouth daily.     simvastatin 40 MG tablet  Commonly known as:  ZOCOR  Take 1 tablet (40 mg total) by mouth at bedtime.           Objective:   Physical Exam BP 125/84  Pulse 87  Temp(Src) 97.9 F (36.6 C)  Ht 5\' 8"  (1.727 m)  Wt 207 lb (93.895 kg)  BMI 31.48 kg/m2  SpO2 93%  General -- alert, well-developed, NAD.  Neck --no thyromegaly , normal carotid pulse  HEENT-- Not pale.  Lungs -- normal respiratory effort, no intercostal retractions, no accessory muscle use, and normal breath sounds.  Heart-- normal rate, regular rhythm, no murmur.  Abdomen-- Not distended, good bowel sounds,soft, non-tender.  Extremities--   Asymmetric edema bilaterally at baseline  Neurologic--  alert & oriented X3. Speech normal, gait slt slow but appropriate for age, strength symmetric and appropriate for age.  Psych-- Cognition and judgment appear intact. Cooperative with normal attention span and concentration. No anxious or depressed appearing.        Assessment & Plan:

## 2013-09-28 NOTE — Assessment & Plan Note (Addendum)
seems well-controlled, check a BMP, continue Lotensin, Toprol, nifedipine

## 2013-09-28 NOTE — Assessment & Plan Note (Signed)
On diet control, sees the eye MD regularly. Will check an A1c

## 2013-09-28 NOTE — Assessment & Plan Note (Signed)
On PPIs, asymptomatic

## 2013-09-28 NOTE — Patient Instructions (Signed)
Get your blood work before you leave    Next visit is for routine check up regards your blood sugar , blood pressure in 6 months  No need to come back fasting Please make an appointment        Fall Prevention and [place] cause injuries and can affect all age groups. It is possible to use preventive measures to significantly decrease the likelihood of falls. There are many simple measures which can make your home safer and prevent falls. OUTDOORS  Repair cracks and edges of walkways and driveways.  Remove high doorway thresholds.  Trim shrubbery on the main path into your home.  Have good outside lighting.  Clear walkways of tools, rocks, debris, and clutter.  Check that handrails are not broken and are securely fastened. Both sides of steps should have handrails.  Have leaves, snow, and ice cleared regularly.  Use sand or salt on walkways during winter months.  In the garage, clean up grease or oil spills. BATHROOM  Install night lights.  Install grab bars by the toilet and in the tub and shower.  Use non-skid mats or decals in the tub or shower.  Place a plastic non-slip stool in the shower to sit on, if needed.  Keep floors dry and clean up all water on the floor immediately.  Remove soap buildup in the tub or shower on a regular basis.  Secure bath mats with non-slip, double-sided rug tape.  Remove throw rugs and tripping hazards from the floors. BEDROOMS  Install night lights.  Make sure a bedside light is easy to reach.  Do not use oversized bedding.  Keep a telephone by your bedside.  Have a firm chair with side arms to use for getting dressed.  Remove throw rugs and tripping hazards from the floor. KITCHEN  Keep handles on pots and pans turned toward the center of the stove. Use back burners when possible.  Clean up spills quickly and allow time for drying.  Avoid walking on wet floors.  Avoid hot utensils and knives.  Position  shelves so they are not too high or low.  Place commonly used objects within easy reach.  If necessary, use a sturdy step stool with a grab bar when reaching.  Keep electrical cables out of the way.  Do not use floor polish or wax that makes floors slippery. If you must use wax, use non-skid floor wax.  Remove throw rugs and tripping hazards from the floor. STAIRWAYS  Never leave objects on stairs.  Place handrails on both sides of stairways and use them. Fix any loose handrails. Make sure handrails on both sides of the stairways are as long as the stairs.  Check carpeting to make sure it is firmly attached along stairs. Make repairs to worn or loose carpet promptly.  Avoid placing throw rugs at the top or bottom of stairways, or properly secure the rug with carpet tape to prevent slippage. Get rid of throw rugs, if possible.  Have an electrician put in a light switch at the top and bottom of the stairs. OTHER FALL PREVENTION TIPS  Wear low-heel or rubber-soled shoes that are supportive and fit well. Wear closed toe shoes.  When using a stepladder, make sure it is fully opened and both spreaders are firmly locked. Do not climb a closed stepladder.  Add color or contrast paint or tape to grab bars and handrails in your home. Place contrasting color strips on first and last steps.  Learn  and use mobility aids as needed. Install an electrical emergency response system.  Turn on lights to avoid dark areas. Replace light bulbs that burn out immediately. Get light switches that glow.  Arrange furniture to create clear pathways. Keep furniture in the same place.  Firmly attach carpet with non-skid or double-sided tape.  Eliminate uneven floor surfaces.  Select a carpet pattern that does not visually hide the edge of steps.  Be aware of all pets. OTHER HOME SAFETY TIPS  Set the water temperature for 120 F (48.8 C).  Keep emergency numbers on or near the telephone.  Keep  smoke detectors on every level of the home and near sleeping areas. Document Released: 02/14/2002 Document Revised: 08/26/2011 Document Reviewed: 05/16/2011 [facility] Patient Information 2015 [place], [place]. This information is not intended to replace advice given to you by your health care provider. Make sure you discuss any questions you have with your health care provider.

## 2013-09-29 ENCOUNTER — Telehealth: Payer: Self-pay | Admitting: Internal Medicine

## 2013-09-29 NOTE — Telephone Encounter (Signed)
Relevant patient education assigned to patient using Emmi. ° °

## 2014-03-31 ENCOUNTER — Ambulatory Visit: Payer: Medicare HMO | Admitting: Internal Medicine

## 2014-04-10 ENCOUNTER — Encounter: Payer: Self-pay | Admitting: Internal Medicine

## 2014-04-10 ENCOUNTER — Ambulatory Visit (INDEPENDENT_AMBULATORY_CARE_PROVIDER_SITE_OTHER): Payer: Medicare HMO | Admitting: Internal Medicine

## 2014-04-10 VITALS — BP 137/89 | HR 75 | Temp 98.1°F | Ht 68.0 in | Wt 209.2 lb

## 2014-04-10 DIAGNOSIS — E119 Type 2 diabetes mellitus without complications: Secondary | ICD-10-CM

## 2014-04-10 DIAGNOSIS — I10 Essential (primary) hypertension: Secondary | ICD-10-CM

## 2014-04-10 DIAGNOSIS — Z23 Encounter for immunization: Secondary | ICD-10-CM

## 2014-04-10 LAB — BASIC METABOLIC PANEL
BUN: 21 mg/dL (ref 6–23)
CALCIUM: 9.1 mg/dL (ref 8.4–10.5)
CO2: 28 mEq/L (ref 19–32)
Chloride: 104 mEq/L (ref 96–112)
Creatinine, Ser: 1.03 mg/dL (ref 0.40–1.50)
GFR: 74.3 mL/min (ref >60.00)
GLUCOSE: 89 mg/dL (ref 70–99)
Potassium: 3.9 mEq/L (ref 3.5–5.1)
SODIUM: 140 meq/L (ref 135–145)

## 2014-04-10 LAB — HEMOGLOBIN A1C: HEMOGLOBIN A1C: 6.5 % (ref 4.6–6.5)

## 2014-04-10 MED ORDER — NIFEDIPINE ER OSMOTIC RELEASE 60 MG PO TB24: 60.0000 mg | ORAL_TABLET | Freq: Every day | ORAL | Status: DC

## 2014-04-10 MED ORDER — ESOMEPRAZOLE MAGNESIUM 20 MG PO CPDR: 20.0000 mg | DELAYED_RELEASE_CAPSULE | Freq: Every day | ORAL | Status: DC

## 2014-04-10 MED ORDER — BENAZEPRIL HCL 20 MG PO TABS: 20.0000 mg | ORAL_TABLET | Freq: Every day | ORAL | Status: DC

## 2014-04-10 MED ORDER — SIMVASTATIN 40 MG PO TABS: 40.0000 mg | ORAL_TABLET | Freq: Every day | ORAL | Status: DC

## 2014-04-10 MED ORDER — METOPROLOL SUCCINATE ER 50 MG PO TB24: 50.0000 mg | ORAL_TABLET | Freq: Every day | ORAL | Status: DC

## 2014-04-10 NOTE — Assessment & Plan Note (Signed)
BP is great today, check a BMP, continue with nifedipine xr, benazepril and metoprolol

## 2014-04-10 NOTE — Patient Instructions (Signed)
Get your blood work before you leave    Please come back to the office in 6 months  for a physical exam. Come back fasting

## 2014-04-10 NOTE — Assessment & Plan Note (Signed)
On diet control only, check A1c

## 2014-04-10 NOTE — Progress Notes (Signed)
Pre visit review using our clinic review tool, if applicable. No additional management support is needed unless otherwise documented below in the visit note. 

## 2014-04-10 NOTE — Progress Notes (Signed)
Subjective:    Patient ID: Ronald [name], male    DOB: July 03, 1934, 80 y.o.   MRN: [id number]  DOS:  04/10/2014 Type of visit - description : rov Interval history: Doing well, good compliance with all medications. Labs reviewed, due for a BMP and A1c BP today is great, not ambulatory BPs. We discussed a flu shot and he decided to go ahead and take it.    ROS Denies chest pain or difficulty breathing No nausea, vomiting, diarrhea.   Past Medical History  Diagnosis Date  . GERD (gastroesophageal reflux disease)   . Hyperlipidemia   . Hypertension   . Hypercalcemia     parathyroidectomy 04/2008  . BPH (benign prostatic hypertrophy)     f/u @ Biopsy, s/p bx 2009 apro (-)  . Hemorrhoids   . Complication of anesthesia     violent upon waking up  . Calculus of kidney     s/p R percutaneus nephrostomy @ [facility]  . Diabetes mellitus     pre diabetic  . Iron deficiency anemia   . Colon polyp   . Diverticulosis   . Cataract   . Edema leg     asymetric , L > R , [place] neg for DVT 09-2010    Past Surgical History  Procedure Laterality Date  . Appendectomy    . Tonsillectomy    . Parathyroidectomy  04/2008  . Kidney stone surgery    . Colonoscopy  12/16/2011    Procedure: COLONOSCOPY;  Surgeon: Inda Castle, MD;  Location: [facility];  Service: Endoscopy;  Laterality: N/A;  . Flexible sigmoidoscopy  02/09/2012    Procedure: FLEXIBLE SIGMOIDOSCOPY;  Surgeon: Inda Castle, MD;  Location: [facility];  Service: Endoscopy;  Laterality: N/A;  . Hemorrhoid banding  02/09/2012    Procedure: HEMORRHOID BANDING;  Surgeon: Inda Castle, MD;  Location: [facility];  Service: Endoscopy;  Laterality: N/A;  . Cataract extraction Right   . Eye surgery  infant    "lazy eye"  . Esophagogastroduodenoscopy N/A 01/17/2013    Procedure: ESOPHAGOGASTRODUODENOSCOPY (EGD);  Surgeon: Inda Castle, MD;  Location: [facility];  Service: Endoscopy;  Laterality: N/A;  . Hot hemostasis N/A  01/17/2013    Procedure: HOT HEMOSTASIS (ARGON PLASMA COAGULATION/BICAP);  Surgeon: Inda Castle, MD;  Location: [facility];  Service: Endoscopy;  Laterality: N/A;    History   Social History  . Marital Status: Widowed    Spouse Name: N/A    Number of Children: 3  . Years of Education: N/A   Occupational History  . retired     Social History Main Topics  . Smoking status: Former Smoker -- 25 years    Types: Cigarettes    Quit date: 11/30/1981  . Smokeless tobacco: Never Used  . Alcohol Use: 4.2 oz/week    7 Glasses of wine per week     Comment: 1 glass of wine or beer with dinner  . Drug Use: No  . Sexual Activity: Not on file   Other Topics Concern  . Not on file   Social History Narrative   Retired, single, has  a girlfriend , 3 children  (one lives in town)   Daughter has a copy of his living will        Medication List       This list is accurate as of: 04/10/14  7:16 PM.  Always use your most recent med list.  benazepril 20 MG tablet  Commonly known as:  LOTENSIN  Take 1 tablet (20 mg total) by mouth daily.     cholecalciferol 1000 UNITS tablet  Commonly known as:  VITAMIN D  Take 1,000 Units by mouth daily.     esomeprazole 20 MG capsule  Commonly known as:  NEXIUM  Take 1 capsule (20 mg total) by mouth daily at 12 noon.     EYE VITAMINS PO  Take 1 tablet by mouth daily.     metoprolol succinate 50 MG 24 hr tablet  Commonly known as:  TOPROL-XL  Take 1 tablet (50 mg total) by mouth daily.     NIFEdipine 60 MG 24 hr tablet  Commonly known as:  NIFEDICAL XL  Take 1 tablet (60 mg total) by mouth daily.     saw palmetto 160 MG capsule  Take 540 mg by mouth daily.     simvastatin 40 MG tablet  Commonly known as:  ZOCOR  Take 1 tablet (40 mg total) by mouth at bedtime.           Objective:   Physical Exam  Constitutional: He is oriented to person, place, and time. He appears well-developed and well-nourished. No  distress.  Cardiovascular:  RRR, no rub or gallop  Pulmonary/Chest: Effort normal. No respiratory distress.  CTA B  Musculoskeletal: Normal range of motion. He exhibits no edema or tenderness.  Neurological: He is alert and oriented to person, place, and time. No cranial nerve deficit. He exhibits normal muscle tone. Coordination normal.  Speech normal, gait unassisted and normal for age, motor strength appropriate for age   Skin: Skin is warm and dry. No pallor.  Psychiatric: He has a normal mood and affect. His behavior is normal. Judgment and thought content normal.   BP 137/89 mmHg  Pulse 75  Temp(Src) 98.1 F (36.7 C) (Oral)  Ht 5\' 8"  (1.727 m)  Wt 209 lb 4 oz (94.915 kg)  BMI 31.82 kg/m2  SpO2 95%       Assessment & Plan:  Most recent labs reviewed with the patient.

## 2014-05-09 ENCOUNTER — Ambulatory Visit (INDEPENDENT_AMBULATORY_CARE_PROVIDER_SITE_OTHER): Payer: Medicare HMO | Admitting: Internal Medicine

## 2014-05-09 ENCOUNTER — Encounter: Payer: Self-pay | Admitting: Internal Medicine

## 2014-05-09 VITALS — BP 131/85 | HR 77 | Temp 98.5°F | Wt 206.5 lb

## 2014-05-09 DIAGNOSIS — J069 Acute upper respiratory infection, unspecified: Secondary | ICD-10-CM

## 2014-05-09 MED ORDER — AZITHROMYCIN 250 MG PO TABS: ORAL_TABLET | ORAL | Status: DC

## 2014-05-09 NOTE — Progress Notes (Signed)
Pre visit review using our clinic review tool, if applicable. No additional management support is needed unless otherwise documented below in the visit note. 

## 2014-05-09 NOTE — Patient Instructions (Signed)
Rest, fluids , tylenol take robitussin  DM   as needed   OTC Nasocort or Flonase : 2 nasal sprays on each side of the nose daily until you feel better   Take the antibiotic as prescribed  (zithromax) only if not improving in the next 5-6 days Call if not back to normal in 2 weeks Call anytime if the symptoms are severe

## 2014-05-09 NOTE — Progress Notes (Signed)
Subjective:    Patient ID: Ronald [name], male    DOB: 1934-11-22, 80 y.o.   MRN: [id number]  DOS:  05/09/2014 Type of visit - description : acute Interval history: Symptoms started a week ago with mild but persistent cough with occasional clear to yellow sputum. Initially he had a temperature 100.9, now afebrile. Taking Robitussin DM without much improvement.    Review of Systems Denies nausea vomiting or diarrhea No myalgias, does have some "rib pain" from coughing Denies any sinus pain or congestion.   Past Medical History  Diagnosis Date  . GERD (gastroesophageal reflux disease)   . Hyperlipidemia   . Hypertension   . Hypercalcemia     parathyroidectomy 04/2008  . BPH (benign prostatic hypertrophy)     f/u @ Biopsy, s/p bx 2009 apro (-)  . Hemorrhoids   . Complication of anesthesia     violent upon waking up  . Calculus of kidney     s/p R percutaneus nephrostomy @ [facility]  . Diabetes mellitus     pre diabetic  . Iron deficiency anemia   . Colon polyp   . Diverticulosis   . Cataract   . Edema leg     asymetric , L > R , [place] neg for DVT 09-2010    Past Surgical History  Procedure Laterality Date  . Appendectomy    . Tonsillectomy    . Parathyroidectomy  04/2008  . Kidney stone surgery    . Colonoscopy  12/16/2011    Procedure: COLONOSCOPY;  Surgeon: Inda Castle, MD;  Location: [facility];  Service: Endoscopy;  Laterality: N/A;  . Flexible sigmoidoscopy  02/09/2012    Procedure: FLEXIBLE SIGMOIDOSCOPY;  Surgeon: Inda Castle, MD;  Location: [facility];  Service: Endoscopy;  Laterality: N/A;  . Hemorrhoid banding  02/09/2012    Procedure: HEMORRHOID BANDING;  Surgeon: Inda Castle, MD;  Location: [facility];  Service: Endoscopy;  Laterality: N/A;  . Cataract extraction Right   . Eye surgery  infant    "lazy eye"  . Esophagogastroduodenoscopy N/A 01/17/2013    Procedure: ESOPHAGOGASTRODUODENOSCOPY (EGD);  Surgeon: Inda Castle, MD;  Location:  [facility];  Service: Endoscopy;  Laterality: N/A;  . Hot hemostasis N/A 01/17/2013    Procedure: HOT HEMOSTASIS (ARGON PLASMA COAGULATION/BICAP);  Surgeon: Inda Castle, MD;  Location: [facility];  Service: Endoscopy;  Laterality: N/A;    History   Social History  . Marital Status: Widowed    Spouse Name: N/A  . Number of Children: 3  . Years of Education: N/A   Occupational History  . retired     Social History Main Topics  . Smoking status: Former Smoker -- 25 years    Types: Cigarettes    Quit date: 11/30/1981  . Smokeless tobacco: Never Used  . Alcohol Use: 4.2 oz/week    7 Glasses of wine per week     Comment: 1 glass of wine or beer with dinner  . Drug Use: No  . Sexual Activity: Not on file   Other Topics Concern  . Not on file   Social History Narrative   Retired, single, has  a girlfriend , 3 children  (one lives in town)   Daughter has a copy of his living will        Medication List       This list is accurate as of: 05/09/14  6:31 PM.  Always use your most recent med list.  azithromycin 250 MG tablet  Commonly known as:  ZITHROMAX Z-PAK  2 tabs a day the first day, then 1 tab a day x 4 days     benazepril 20 MG tablet  Commonly known as:  LOTENSIN  Take 1 tablet (20 mg total) by mouth daily.     cholecalciferol 1000 UNITS tablet  Commonly known as:  VITAMIN D  Take 1,000 Units by mouth daily.     esomeprazole 20 MG capsule  Commonly known as:  NEXIUM  Take 1 capsule (20 mg total) by mouth daily at 12 noon.     EYE VITAMINS PO  Take 1 tablet by mouth daily.     metoprolol succinate 50 MG 24 hr tablet  Commonly known as:  TOPROL-XL  Take 1 tablet (50 mg total) by mouth daily.     NIFEdipine 60 MG 24 hr tablet  Commonly known as:  NIFEDICAL XL  Take 1 tablet (60 mg total) by mouth daily.     saw palmetto 160 MG capsule  Take 540 mg by mouth daily.     simvastatin 40 MG tablet  Commonly known as:  ZOCOR  Take 1  tablet (40 mg total) by mouth at bedtime.           Objective:   Physical Exam BP 131/85 mmHg  Pulse 77  Temp(Src) 98.5 F (36.9 C) (Oral)  Wt 206 lb 8 oz (93.668 kg)  SpO2 96% General:   Well developed, well nourished . NAD.  HEENT:  Normocephalic . Face symmetric, atraumatic. Sinuses no TTP Tympanic membranes normal, throat symmetric and not red. Nose is slightly congested Lungs:  CTA B Normal respiratory effort, no intercostal retractions, no accessory muscle use. Heart: RRR,  no murmur.  Neurologic:  alert & oriented X3.  Speech normal, gait appropriate for age and unassisted Psych--  Cognition and judgment appear intact.  Cooperative with normal attention span and concentration.  Behavior appropriate. No anxious or depressed appearing.     Assessment & Plan:     URI, Recommend conservative treatment first, see instruction. Only take antibiotics if symptoms persistent.

## 2014-06-14 ENCOUNTER — Other Ambulatory Visit: Payer: Self-pay

## 2014-08-08 ENCOUNTER — Telehealth: Payer: Self-pay | Admitting: Internal Medicine

## 2014-08-08 NOTE — Telephone Encounter (Signed)
Caller name: Rollen Selders Relationship to patient: self Can be reached: [phone]  Reason for call: Pt called to see if we can perform H Pylori breath test in our office/lab? He would like a return call.

## 2014-08-08 NOTE — Telephone Encounter (Signed)
Please advise 

## 2014-08-08 NOTE — Telephone Encounter (Signed)
Spoke with Pt, informed we can do H. Pylori breath test in office. Informed Pt that if not urgent he can wait until his CPE on 10/04/2014 at 0900. Pt verbalized that it was not urgent, he was just calling to see if we can perform test. Informed him that we can. Pt verbalized understanding.

## 2014-08-08 NOTE — Telephone Encounter (Signed)
We can order that if there is a reason for it; we can talk about it when he comes back for his physical in August. If he thinks is something urgent, please make an appointment

## 2014-10-04 ENCOUNTER — Encounter: Payer: Medicare HMO | Admitting: Internal Medicine

## 2014-11-30 ENCOUNTER — Telehealth: Payer: Self-pay | Admitting: Behavioral Health

## 2014-11-30 ENCOUNTER — Encounter: Payer: Self-pay | Admitting: Behavioral Health

## 2014-11-30 NOTE — Telephone Encounter (Signed)
Pre-Visit Call completed with patient and chart updated.   Pre-Visit Info documented in Specialty Comments under [employer].    

## 2014-12-01 ENCOUNTER — Encounter: Payer: Self-pay | Admitting: Internal Medicine

## 2014-12-01 ENCOUNTER — Ambulatory Visit (INDEPENDENT_AMBULATORY_CARE_PROVIDER_SITE_OTHER): Payer: Medicare HMO | Admitting: Internal Medicine

## 2014-12-01 VITALS — BP 118/76 | HR 64 | Temp 97.6°F | Ht 68.0 in | Wt 206.2 lb

## 2014-12-01 DIAGNOSIS — N5089 Other specified disorders of the male genital organs: Secondary | ICD-10-CM

## 2014-12-01 DIAGNOSIS — Z0000 Encounter for general adult medical examination without abnormal findings: Secondary | ICD-10-CM

## 2014-12-01 DIAGNOSIS — D649 Anemia, unspecified: Secondary | ICD-10-CM

## 2014-12-01 DIAGNOSIS — Z09 Encounter for follow-up examination after completed treatment for conditions other than malignant neoplasm: Secondary | ICD-10-CM

## 2014-12-01 DIAGNOSIS — E559 Vitamin D deficiency, unspecified: Secondary | ICD-10-CM

## 2014-12-01 DIAGNOSIS — E785 Hyperlipidemia, unspecified: Secondary | ICD-10-CM

## 2014-12-01 DIAGNOSIS — E119 Type 2 diabetes mellitus without complications: Secondary | ICD-10-CM

## 2014-12-01 DIAGNOSIS — I10 Essential (primary) hypertension: Secondary | ICD-10-CM

## 2014-12-01 DIAGNOSIS — Z23 Encounter for immunization: Secondary | ICD-10-CM | POA: Diagnosis not present

## 2014-12-01 LAB — CBC WITH DIFFERENTIAL/PLATELET
BASOS ABS: 0 10*3/uL (ref 0.0–0.1)
Basophils Relative: 0 % (ref 0–1)
Eosinophils Absolute: 0.2 10*3/uL (ref 0.0–0.7)
Eosinophils Relative: 2 % (ref 0–5)
HEMATOCRIT: 50.7 % (ref 39.0–52.0)
HEMOGLOBIN: 17.4 g/dL — AB (ref 13.0–17.0)
LYMPHS PCT: 26 % (ref 12–46)
Lymphs Abs: 2 10*3/uL (ref 0.7–4.0)
MCH: 29.5 pg (ref 26.0–34.0)
MCHC: 34.3 g/dL (ref 30.0–36.0)
MCV: 86.1 fL (ref 78.0–100.0)
MPV: 9.8 fL (ref 8.6–12.4)
Monocytes Absolute: 0.5 10*3/uL (ref 0.1–1.0)
Monocytes Relative: 6 % (ref 3–12)
NEUTROS PCT: 66 % (ref 43–77)
Neutro Abs: 5.1 10*3/uL (ref 1.7–7.7)
Platelets: 200 10*3/uL (ref 150–400)
RBC: 5.89 MIL/uL — ABNORMAL HIGH (ref 4.22–5.81)
RDW: 14.7 % (ref 11.5–15.5)
WBC: 7.8 10*3/uL (ref 4.0–10.5)

## 2014-12-01 LAB — LIPID PANEL
CHOLESTEROL: 155 mg/dL (ref 125–200)
HDL: 37 mg/dL — ABNORMAL LOW (ref >=40)
LDL Cholesterol: 87 mg/dL (ref <130)
Total CHOL/HDL Ratio: 4.2 Ratio (ref <=5.0)
Triglycerides: 154 mg/dL — ABNORMAL HIGH (ref <150)
VLDL: 31 mg/dL — ABNORMAL HIGH (ref <30)

## 2014-12-01 LAB — BASIC METABOLIC PANEL
BUN: 17 mg/dL (ref 7–25)
CALCIUM: 9.1 mg/dL (ref 8.6–10.3)
CO2: 24 mmol/L (ref 20–31)
Chloride: 104 mmol/L (ref 98–110)
Creat: 1.06 mg/dL (ref 0.70–1.18)
GLUCOSE: 95 mg/dL (ref 65–99)
Potassium: 3.7 mmol/L (ref 3.5–5.3)
Sodium: 139 mmol/L (ref 135–146)

## 2014-12-01 LAB — FERRITIN: FERRITIN: 23 ng/mL (ref 22–322)

## 2014-12-01 LAB — ALT: ALT: 22 U/L (ref 9–46)

## 2014-12-01 LAB — AST: AST: 19 U/L (ref 10–35)

## 2014-12-01 LAB — IRON: Iron: 120 ug/dL (ref 50–180)

## 2014-12-01 NOTE — Patient Instructions (Addendum)
Get your blood work before you leave   Next visit for a routine checkup in 4-5 months. No need to be fasting. Please make an appointment   Fall Prevention and Home Safety Falls cause injuries and can affect all age groups. It is possible to use preventive measures to significantly decrease the likelihood of falls. There are many simple measures which can make your home safer and prevent falls. OUTDOORS  Repair cracks and edges of walkways and driveways.  Remove high doorway thresholds.  Trim shrubbery on the main path into your home.  Have good outside lighting.  Clear walkways of tools, rocks, debris, and clutter.  Check that handrails are not broken and are securely fastened. Both sides of steps should have handrails.  Have leaves, snow, and ice cleared regularly.  Use sand or salt on walkways during winter months.  In the garage, clean up grease or oil spills. BATHROOM  Install night lights.  Install grab bars by the toilet and in the tub and shower.  Use non-skid mats or decals in the tub or shower.  Place a plastic non-slip stool in the shower to sit on, if needed.  Keep floors dry and clean up all water on the floor immediately.  Remove soap buildup in the tub or shower on a regular basis.  Secure bath mats with non-slip, double-sided rug tape.  Remove throw rugs and tripping hazards from the floors. BEDROOMS  Install night lights.  Make sure a bedside light is easy to reach.  Do not use oversized bedding.  Keep a telephone by your bedside.  Have a firm chair with side arms to use for getting dressed.  Remove throw rugs and tripping hazards from the floor. KITCHEN  Keep handles on pots and pans turned toward the center of the stove. Use back burners when possible.  Clean up spills quickly and allow time for drying.  Avoid walking on wet floors.  Avoid hot utensils and knives.  Position shelves so they are not too high or low.  Place commonly  used objects within easy reach.  If necessary, use a sturdy step stool with a grab bar when reaching.  Keep electrical cables out of the way.  Do not use floor polish or wax that makes floors slippery. If you must use wax, use non-skid floor wax.  Remove throw rugs and tripping hazards from the floor. STAIRWAYS  Never leave objects on stairs.  Place handrails on both sides of stairways and use them. Fix any loose handrails. Make sure handrails on both sides of the stairways are as long as the stairs.  Check carpeting to make sure it is firmly attached along stairs. Make repairs to worn or loose carpet promptly.  Avoid placing throw rugs at the top or bottom of stairways, or properly secure the rug with carpet tape to prevent slippage. Get rid of throw rugs, if possible.  Have an electrician put in a light switch at the top and bottom of the stairs. OTHER FALL PREVENTION TIPS  Wear low-heel or rubber-soled shoes that are supportive and fit well. Wear closed toe shoes.  When using a stepladder, make sure it is fully opened and both spreaders are firmly locked. Do not climb a closed stepladder.  Add color or contrast paint or tape to grab bars and handrails in your home. Place contrasting color strips on first and last steps.  Learn and use mobility aids as needed. Install an electrical emergency response system.  Turn on lights  to avoid dark areas. Replace light bulbs that burn out immediately. Get light switches that glow.  Arrange furniture to create clear pathways. Keep furniture in the same place.  Firmly attach carpet with non-skid or double-sided tape.  Eliminate uneven floor surfaces.  Select a carpet pattern that does not visually hide the edge of steps.  Be aware of all pets. OTHER HOME SAFETY TIPS  Set the water temperature for 120 F (48.8 C).  Keep emergency numbers on or near the telephone.  Keep smoke detectors on every level of the home and near sleeping  areas. Document Released: 02/14/2002 Document Revised: 08/26/2011 Document Reviewed: 05/16/2011 [facility] Patient Information 2015 [place], [place]. This information is not intended to replace advice given to you by your health care provider. Make sure you discuss any questions you have with your health care provider.   Preventive Care for Adults Ages 48 and over  Blood pressure check.** / Every 1 to 2 years.  Lipid and cholesterol check.**/ Every 5 years beginning at age 33.  Lung cancer screening. / Every year if you are aged 36-80 years and have a 30-pack-year history of smoking and currently smoke or have quit within the past 15 years. Yearly screening is stopped once you have quit smoking for at least 15 years or develop a health problem that would prevent you from having lung cancer treatment.  Fecal occult blood test (FOBT) of stool. / Every year beginning at age 67 and continuing until age 37. You may not have to do this test if you get a colonoscopy every 10 years.  Flexible sigmoidoscopy** or colonoscopy.** / Every 5 years for a flexible sigmoidoscopy or every 10 years for a colonoscopy beginning at age 19 and continuing until age 32.  Hepatitis C blood test.** / For all people born from [birth year] through 1963 and any individual with known risks for hepatitis C.  Abdominal aortic aneurysm (AAA) screening.** / A one-time screening for ages 20 to 28 years who are current or former smokers.  Skin self-exam. / Monthly.  Influenza vaccine. / Every year.  Tetanus, diphtheria, and acellular pertussis (Tdap/Td) vaccine.** / 1 dose of Td every 10 years.  Varicella vaccine.** / Consult your health care provider.  Zoster vaccine.** / 1 dose for adults aged 40 years or older.  Pneumococcal 13-valent conjugate (PCV13) vaccine.** / Consult your health care provider.  Pneumococcal polysaccharide (PPSV23) vaccine.** / 1 dose for all adults aged 53 years and older.  Meningococcal vaccine.** /  Consult your health care provider.  Hepatitis A vaccine.** / Consult your health care provider.  Hepatitis B vaccine.** / Consult your health care provider.  Haemophilus influenzae type b (Hib) vaccine.** / Consult your health care provider. **Family history and personal history of risk and conditions may change your health care provider's recommendations. Document Released: 04/22/2001 Document Revised: 03/01/2013 Document Reviewed: 07/22/2010 [facility] Patient Information 2015 [place], [place]. This information is not intended to replace advice given to you by your health care provider. Make sure you discuss any questions you have with your health care provider.

## 2014-12-01 NOTE — Assessment & Plan Note (Addendum)
TD 2007; Pneumonia shot-- again declined ; zostavax-- had already; flu shot pro-cons discussed: provided today  Cscope  03-2006, Bx adenomatous polyp (Dr Sharlett Iles) [place] again 12-2011--- no further scopes needed  Continue with his healthy diet, will not exercise (personal decision)

## 2014-12-01 NOTE — Progress Notes (Signed)
Subjective:    Patient ID: Ronald [name], male    DOB: 1934-05-07, 80 y.o.   MRN: [id number]  DOS:  12/01/2014 Type of visit - description : CPX Interval history:  Here for Medicare AWV:  1. Risk factors based on Past M, S, F history: reviewed  2. Physical Activities: rarely does some physical activity  3. Depression/mood: Neg screening  4. Hearing: No problems noted or reported  5. ADL's: Lives by himself has a girlfriend, totally independent  66. Fall Risk: no recent falls, prevention discussed  7. home Safety: does feel safe at home  8. Height, weight, &visual acuity: see VS, uses glasses; ,had cataract surgery on R vision not improved, sees opht regulalrly 9. Counseling: provided  10. Labs ordered based on risk factors: if needed  11. Referral Coordination: if needed  12. Care Plan, see assessment and plan  13. Cognitive Assessment:motor and cognition skills normal for age  30.  Providers list updated  15. End of life care-- has a HC -POA  In addition, today we discussed the following: Hypertension: Good compliance of medication, ambulatory BPs checked infrequently but usually in the 120s, never more than 140 High cholesterol: Good compliance with simvastatin. Increase PSAs: No recent visits to urology, does not plan to go back to them at this point. H. pylori breath test? The patient has a history of gastric polyps and likes to have the H. pylori test for that reason.   Review of Systems Constitutional: No fever. No chills. No unexplained wt changes. No unusual sweats  HEENT: No dental problems, no ear discharge, no facial swelling, no voice changes. No eye discharge, no eye  redness , no  intolerance to light   Respiratory: No wheezing , no  difficulty breathing. No cough , no mucus production  Cardiovascular: No CP, no leg swelling , no  Palpitations  GI: no nausea, no vomiting, no diarrhea , no  abdominal pain.  No blood in the stools. No dysphagia,  no odynophagia    Endocrine: No polyphagia, no polyuria , no polydipsia  GU: No dysuria, gross hematuria, mild "weak urinary stream" at baseline, not really difficulty urinating. No urinary urgency, no frequency.  Musculoskeletal: Occasionally has pain at the left great toe at night, no actual numbness, redness, swelling.   Skin: No change in the color of the skin, palor , no  Rash  Allergic, immunologic: No environmental allergies , no  food allergies  Neurological: No dizziness no  syncope. No headaches. No diplopia, no slurred, no slurred speech, no motor deficits, no facial  Numbness  Hematological: No enlarged lymph nodes, no easy bruising , no unusual bleedings  Psychiatry: No suicidal ideas, no hallucinations, no beavior problems, no confusion.  No unusual/severe anxiety, no depression   Past Medical History  Diagnosis Date  . GERD (gastroesophageal reflux disease)   . Hyperlipidemia   . Hypertension   . Hypercalcemia     parathyroidectomy 04/2008  . BPH (benign prostatic hypertrophy)     f/u @ [facility], s/p bx 2009 apro (-)  . Hemorrhoids   . Complication of anesthesia     violent upon waking up  . Calculus of kidney     s/p R percutaneus nephrostomy @ [facility]  . Diabetes mellitus     pre diabetic  . Iron deficiency anemia   . Colon polyp   . Diverticulosis   . Cataract   . Edema leg     asymetric , L > R ,  [place] neg for DVT 09-2010    Past Surgical History  Procedure Laterality Date  . Appendectomy    . Tonsillectomy    . Parathyroidectomy  04/2008  . Kidney stone surgery    . Colonoscopy  12/16/2011    Procedure: COLONOSCOPY;  Surgeon: Inda Castle, MD;  Location: [facility] ENDOSCOPY;  Service: Endoscopy;  Laterality: N/A;  . Flexible sigmoidoscopy  02/09/2012    Procedure: FLEXIBLE SIGMOIDOSCOPY;  Surgeon: Inda Castle, MD;  Location: [facility] ENDOSCOPY;  Service: Endoscopy;  Laterality: N/A;  . Hemorrhoid banding  02/09/2012    Procedure: HEMORRHOID BANDING;   Surgeon: Inda Castle, MD;  Location: [facility] ENDOSCOPY;  Service: Endoscopy;  Laterality: N/A;  . Cataract extraction Right   . Eye surgery  infant    "lazy eye"  . Esophagogastroduodenoscopy N/A 01/17/2013    Procedure: ESOPHAGOGASTRODUODENOSCOPY (EGD);  Surgeon: Inda Castle, MD;  Location: [facility];  Service: Endoscopy;  Laterality: N/A;  . Hot hemostasis N/A 01/17/2013    Procedure: HOT HEMOSTASIS (ARGON PLASMA COAGULATION/BICAP);  Surgeon: Inda Castle, MD;  Location: [facility];  Service: Endoscopy;  Laterality: N/A;    Social History   Social History  . Marital Status: Widowed    Spouse Name: N/A  . Number of Children: 3  . Years of Education: N/A   Occupational History  . retired     Social History Main Topics  . Smoking status: Former Smoker -- 25 years    Types: Cigarettes    Quit date: 11/30/1981  . Smokeless tobacco: Never Used  . Alcohol Use: 4.2 oz/week    7 Glasses of wine per week     Comment: 1 glass of wine or beer with dinner  . Drug Use: No  . Sexual Activity: Not on file   Other Topics Concern  . Not on file   Social History Narrative   Retired, single, has  a girlfriend , 3 children  (one lives in town)   Daughter has a copy of his living will     Family History  Problem Relation Age of Onset  . Heart attack Father 66  . Heart attack Mother   . Colon cancer Neg Hx   . Prostate cancer Neg Hx   . Diabetes Neg Hx        Medication List       This list is accurate as of: 12/01/14 11:59 PM.  Always use your most recent med list.               benazepril 20 MG tablet  Commonly known as:  LOTENSIN  Take 1 tablet (20 mg total) by mouth daily.     Biotin 1000 MCG tablet  Take 1,000 mcg by mouth daily.     cholecalciferol 1000 UNITS tablet  Commonly known as:  VITAMIN D  Take 1,000 Units by mouth daily.     esomeprazole 20 MG capsule  Commonly known as:  NEXIUM  Take 1 capsule (20 mg total) by mouth daily at 12 noon.      metoprolol succinate 50 MG 24 hr tablet  Commonly known as:  TOPROL-XL  Take 1 tablet (50 mg total) by mouth daily.     NIFEdipine 60 MG 24 hr tablet  Commonly known as:  NIFEDICAL XL  Take 1 tablet (60 mg total) by mouth daily.     saw palmetto 160 MG capsule  Take 540 mg by mouth daily.     simvastatin 40 MG  tablet  Commonly known as:  ZOCOR  Take 1 tablet (40 mg total) by mouth at bedtime.           Objective:   Physical Exam BP 118/76 mmHg  Pulse 64  Temp(Src) 97.6 F (36.4 C) (Oral)  Ht 5\' 8"  (1.727 m)  Wt 206 lb 4 oz (93.554 kg)  BMI 31.37 kg/m2  SpO2 98% General:   Well developed, well nourished . NAD.  HEENT:  Normocephalic . Face symmetric, atraumatic  neck: No thyromegaly Lungs:  CTA B Normal respiratory effort, no intercostal retractions, no accessory muscle use. Heart: RRR,  no murmur.  no pretibial edema bilaterally  Abdomen:  Not distended, soft, non-tender. No rebound or rigidity. No bruit MSK: Left great toe without redness, swelling or deformities. Skin: Not pale. Not jaundice Neurologic:  alert & oriented X3.  Speech normal, gait appropriate for age and unassisted Psych--  Cognition and judgment appear intact.  Cooperative with normal attention span and concentration.  Behavior appropriate. No anxious or depressed appearing.    Assessment & Plan:    Assessment > Prediabetes HTN Hyperlipidemia Osteopenia T score -2.0 (2010) LEG Edema,L>R , chronic, [place] neg DVT 2012 GU: --BPH  --ED --Elevated PSA, s/p multiple bx (last 2009?neg), last OV w/ urology 2014, last  PSA 2014 ~ 13 Hypercalcemia, parathyroidectomy 2010 Renal stones) time as nephrostomy 2009 at [place]: --Anemia: EGD 12-2012  (Bx: H pylory neg) [id number] gastric polyposis, Bx H Pylory +, was rec treatment Anemia felt to be d/t chronic bleed from  gastric polyp --GERD --cscope 12-2011 >>> multiple hyperplastic  Polyps, tics  --12-2011 Banding internal hemorrhoids  4 --02-2012: Flex sig d/t discomfort     Plan: Diabetes: Check A1c. Hypertension: Seems well-controlled, check a BMP History of anemia: Status post a GI workup, check a CBC, iron and ferritin Gastric polyposis: pt request to check a H pylori breathing test, however he was treated for H. pylori 2014 History low parathyroidectomy: Monitoring his calcium today. TSH has been within normal Osteopenia: bone Density: 04/10/08 w/ [place]   AP Spine L1-L4 (T-score -1.2); Dual Femur Neck Left (T-score -2.0); recommend repeat a bone density test History of low vitamin D: Check a vitamin D   Elevated PSA: Last PSA was 2014, ~ 13, the patient does not desire to go back to urology or further eval. Pros and cons of  observe versus check a PSA discuss carefully, again he does not desire any further eval at this time. He remains essentially symptomatic from a GU standpoint.

## 2014-12-01 NOTE — Progress Notes (Signed)
Pre visit review using our clinic review tool, if applicable. No additional management support is needed unless otherwise documented below in the visit note. 

## 2014-12-02 DIAGNOSIS — Z09 Encounter for follow-up examination after completed treatment for conditions other than malignant neoplasm: Secondary | ICD-10-CM | POA: Insufficient documentation

## 2014-12-02 NOTE — Assessment & Plan Note (Signed)
Diabetes: Check A1c. Hypertension: Seems well-controlled, check a BMP History of anemia: Status post a GI workup, check a CBC, iron and ferritin Gastric polyposis: pt request to check a H pylori breathing test, however he was treated for H. pylori 2014 History low parathyroidectomy: Monitoring his calcium today. TSH has been within normal Osteopenia: bone Density: 04/10/08 w/ [place]   AP Spine L1-L4 (T-score -1.2); Dual Femur Neck Left (T-score -2.0); recommend repeat a bone density test History of low vitamin D: Check a vitamin D   Elevated PSA: Last PSA was 2014, ~ 13, the patient does not desire to go back to urology or further eval. Pros and cons of  observe versus check a PSA discuss carefully, again he does not desire any further eval at this time. He remains essentially symptomatic from a GU standpoint.

## 2014-12-04 LAB — VITAMIN D 1,25 DIHYDROXY
Vitamin D 1, 25 (OH)2 Total: 55 pg/mL (ref 18–72)
Vitamin D3 1, 25 (OH)2: 55 pg/mL

## 2014-12-05 ENCOUNTER — Telehealth: Payer: Self-pay | Admitting: Internal Medicine

## 2014-12-05 DIAGNOSIS — Z8619 Personal history of other infectious and parasitic diseases: Secondary | ICD-10-CM

## 2014-12-05 NOTE — Telephone Encounter (Signed)
Caller name: Ronald [name]   Relationship to patient: Self   Can be reached: [phone]  Pharmacy:  Reason for call: pt called in to check the status of a test that he says the provider mentioned to him. An H Pylori test but he hasn't heard anything else about it. ALSO, pt says that he was scheduled a bone density test but isnt sure why. Pt is requesting a call back to discuss directly because he's he needs clarity of things.

## 2014-12-05 NOTE — Telephone Encounter (Signed)
Plan: Diabetes: Check A1c. Hypertension: Seems well-controlled, check a BMP History of anemia: Status post a GI workup, check a CBC, iron and ferritin Gastric polyposis: pt request to check a H pylori breathing test, however he was treated for H. pylori 2014 History low parathyroidectomy: Monitoring his calcium today. TSH has been within normal Osteopenia: bone Density: 04/10/08 w/ [place] AP Spine L1-L4 (T-score -1.2); Dual Femur Neck Left (T-score -2.0); recommend repeat a bone density test History of low vitamin D: Check a vitamin D  Elevated PSA: Last PSA was 2014, ~ 13, the patient does not desire to go back to urology or further eval. Pros and cons of observe versus check a PSA discuss carefully, again he does not desire any further eval at this time. He remains essentially symptomatic from a GU standpoint

## 2014-12-05 NOTE — Telephone Encounter (Signed)
Spoke with Ronald [name], informed him of Dr. Larose Kells recommendations in not having H. Pylori breath test since appropriately treated with antibiotics in 2014 and not having symptoms. Ronald [name] then stated that he wasn't having symptoms in 2014 when previously diagnosed and wasn't sure of symptoms. Symptoms discussed with Ronald [name], informed him to look for excessive burping, bloated/full feeling, N/V, and weight loss. Ronald [name] wanted me to let Dr. Larose Kells know that he would still prefer to have test completed. Informed him I would let Dr. Larose Kells know and see if I can have orders placed. Told Ronald [name] in regards to bone density test, his test in 2010 showed Osteopenia, and was recommended to repeat in two to three years. Ronald [name] verbalized understanding and has bone density scheduled tomorrow.

## 2014-12-05 NOTE — Telephone Encounter (Signed)
Notes Recorded by Wilfrid Lund, CMA on 12/05/2014 at 8:26 AM Results released to [place]. Notes Recorded by Colon Branch, MD on 12/05/2014 at 8:23 AM Release these results to MyChart with attached comments  Ronald [name], your cholesterol is very good, your iron is normal, no anemia. His nocturia. Regards the question of testing for H. pylori with a breathing test, after reviewing the chart I realize that you were treated appropriately with antibiotics back in 2014 so I don't think the test is needed unless you have symptoms. If you still have questions about this issue please let me know. In summary good results, continue taking the same medications

## 2014-12-05 NOTE — Telephone Encounter (Signed)
Spoke with Pt, informed him H. Pylori breath test has been ordered. Pt has bone density scan scheduled tomorrow at 1000 and would like for breath test to be completed afterward. Lab appt scheduled for 1045 tomorrow. Informed Pt that test may take 15-20 minutes. Pt verbalized understanding.

## 2014-12-05 NOTE — Telephone Encounter (Signed)
Enter a order for a breathing test for H. pylori, dx gastric polyposis.

## 2014-12-06 ENCOUNTER — Other Ambulatory Visit: Payer: Medicare HMO

## 2014-12-06 ENCOUNTER — Ambulatory Visit (HOSPITAL_BASED_OUTPATIENT_CLINIC_OR_DEPARTMENT_OTHER)
Admission: RE | Admit: 2014-12-06 | Discharge: 2014-12-06 | Disposition: A | Discharge status: Home / Self Care | Payer: Medicare HMO | Source: Ambulatory Visit | Attending: Internal Medicine | Admitting: Internal Medicine

## 2014-12-06 DIAGNOSIS — M85852 Other specified disorders of bone density and structure, left thigh: Secondary | ICD-10-CM | POA: Insufficient documentation

## 2014-12-06 DIAGNOSIS — N508 Other specified disorders of male genital organs: Secondary | ICD-10-CM | POA: Diagnosis present

## 2014-12-06 DIAGNOSIS — Z8619 Personal history of other infectious and parasitic diseases: Secondary | ICD-10-CM

## 2014-12-06 DIAGNOSIS — N5089 Other specified disorders of the male genital organs: Secondary | ICD-10-CM

## 2014-12-07 LAB — H. PYLORI BREATH TEST: H. pylori Breath Test: DETECTED — AB

## 2014-12-08 ENCOUNTER — Telehealth: Payer: Self-pay

## 2014-12-08 NOTE — Telephone Encounter (Signed)
Spoke with Pt, informed him of positive H. Pylori breath test. Informed him to expect a call from Dr. Larose Kells in the next several days regarding treatment. Pt verbalized understanding and wondered if bone density results were back. I informed him they were but have not been resulted to me yet. Pt again verbalized understanding.

## 2014-12-11 MED ORDER — BIS SUBCIT-METRONID-TETRACYC 140-125-125 MG PO CAPS: 3.0000 | ORAL_CAPSULE | Freq: Two times a day (BID) | ORAL | Status: DC

## 2014-12-11 NOTE — Telephone Encounter (Signed)
Pt informed via MyChart to start Pylera, 3 capsules twice daily as well as increase Nexium to twice daily for the next 2 weeks. Instructed Pt to call office or let us know if he has any questions or concerns.

## 2014-12-11 NOTE — Telephone Encounter (Signed)
Spoke with Pt, informed him of medication and dosage. He informed me he was unable to receive medication because his insurance requires a PA. Received number from [place] to call PA: [phone]. Called and spoke with Building services engineer at [place]. Informed her that Pylera is being used for H. Pylori. Medication approved by insurance. Informed she would fax confirmation to [phone].

## 2014-12-11 NOTE — Telephone Encounter (Signed)
Patient was treated with Prevpac, after reviewing the chart I recommend treatment with Pylera  for [redacted] weeks along with PPIs. Increase Nexium to twice a day for 2 weeks Case informally discuss with GI, they are in agreement, if he develops symptoms or evidence of stomach problems will need to be seen again. Please notify the patient of above

## 2014-12-12 NOTE — Telephone Encounter (Signed)
Received PA approval fax. Pylera approved through 03/08/2016. Authorization #:[id number]. Authorization sent to scanning.

## 2014-12-12 NOTE — Telephone Encounter (Signed)
Approval faxed to [place].

## 2014-12-14 NOTE — Telephone Encounter (Signed)
Relation to ZP:SUGA Call back number:  [phone]  Pharmacy:  Reason for call:  Patient states medication is expensive and as per insurance requesting PCP to contact insurance and as for a "formal exception" .

## 2014-12-15 NOTE — Telephone Encounter (Signed)
We have already completed a PA and it was approved.   If the patient needs anything futher, they need to have insurance send [place] the paperwork because I do not know what a "formal exception" is.

## 2014-12-19 ENCOUNTER — Telehealth: Payer: Self-pay | Admitting: Internal Medicine

## 2014-12-19 NOTE — Telephone Encounter (Signed)
Pt called to discuss medication Pylera. He would like to know more about it before he begins taking it. Please call on [phone].

## 2014-12-20 NOTE — Telephone Encounter (Signed)
Spoke with Pt, informed him we have sent his insurance a Tier Exception to try and lower out-of-pocket cost of Pylera. Informed him we requested expedited request and should hopefully hear back within 24 hours. Informed him further advice once we hear back from insurance. Pt verbalized understanding.

## 2014-12-20 NOTE — Telephone Encounter (Signed)
Spoke with Pt, he informed me that Pylera will cost him $355 dollars out of pocket for 84 tablets. He is wondering if their is any other medication that can be used to treat H. Pylori that is cheaper, and also he wanted to know at the end of treatment will he be tested again to make sure that the H. Pylori has been correctly treated. I informed him I didn't know if their is any other medications that can effectively treat H. Pylori since he had previously tried Prevpak in 2014. Pt verbalized understanding. Lamount Cohen also informed me that if no other alternative is available we can try a Tier exception with Pt's insurance to try and lower the cost out of pocket for Pt. Please advise.

## 2014-12-20 NOTE — Telephone Encounter (Signed)
We will be checking H. pylori testing 3 months. Please try the tier  exception with the patient's insurance. If that doesn't work, I will prescribe the 4 drugs in Pylera separately , will be inconvenient but less expensive and otherwise the same.

## 2014-12-20 NOTE — Telephone Encounter (Signed)
Tier exception request submitted to [facility] for an expedited review. Awaiting determination. JG//CMA

## 2014-12-21 MED ORDER — METRONIDAZOLE 250 MG PO TABS: 250.0000 mg | ORAL_TABLET | Freq: Four times a day (QID) | ORAL | Status: DC

## 2014-12-21 MED ORDER — TETRACYCLINE HCL 500 MG PO CAPS: 500.0000 mg | ORAL_CAPSULE | Freq: Four times a day (QID) | ORAL | Status: DC

## 2014-12-21 NOTE — Telephone Encounter (Signed)
Spoke with Pt, informed him that his insurance has denied the Tier exception and informed him of Dr. Ethel Rana recommendations. He informed me that he has been researching new insurance companies and has found [employer], and [place] is a Tier 4 at $85, but would not be able to sign up for new insurance until March 11, 2015. Pt wondered since he is having no symptoms of H. Pylori and he has (assuming) it awhile if he can just wait for treatment until January. Informed him that I would send to Dr. Larose Kells for advice. Pt verbalized understanding and informed me that if okay to wait to January, no need to call back, but if different recommendations to please call.

## 2014-12-21 NOTE — Telephone Encounter (Signed)
Tier exception request has been denied by [place]. Dr. Larose Kells can call and do a peer-to-peer review at [phone]. The request referral # is [id number]. Please advise. [email or web address]

## 2014-12-21 NOTE — Telephone Encounter (Signed)
Spoke with Dr. Larose Kells, verbal given for Pt to wait until January 2017 for treatment.

## 2014-12-21 NOTE — Telephone Encounter (Signed)
After about 15 minutes of trying to contact the company I eventually spoke with somebody who said that a tier  exception is not granted because the medication was not in the formulary  to begin with. Plan: Flagyl 4 times a day for 2 weeks Tetracycline 4 times a day for 2 weeks OTC omeprazole 20 mg twice a day for 2 weeks OTC Pepto-Bismol   140 mg 4 times a day for 2 weeks

## 2015-01-08 ENCOUNTER — Encounter: Payer: Self-pay | Admitting: Internal Medicine

## 2015-01-08 DIAGNOSIS — A048 Other specified bacterial intestinal infections: Secondary | ICD-10-CM

## 2015-01-08 NOTE — Telephone Encounter (Signed)
H. Pylori breath test ordered to be repeated in 4 weeks.

## 2015-01-11 ENCOUNTER — Encounter: Payer: Self-pay | Admitting: Internal Medicine

## 2015-02-05 ENCOUNTER — Other Ambulatory Visit: Payer: Medicare HMO

## 2015-02-05 DIAGNOSIS — A048 Other specified bacterial intestinal infections: Secondary | ICD-10-CM

## 2015-02-06 LAB — H. PYLORI BREATH TEST: H. PYLORI BREATH TEST: NOT DETECTED

## 2015-03-14 ENCOUNTER — Telehealth: Payer: Self-pay | Admitting: Internal Medicine

## 2015-03-14 DIAGNOSIS — I10 Essential (primary) hypertension: Secondary | ICD-10-CM

## 2015-03-14 MED ORDER — NIFEDIPINE ER OSMOTIC RELEASE 60 MG PO TB24: 60.0000 mg | ORAL_TABLET | Freq: Every day | ORAL | Status: DC

## 2015-03-14 MED ORDER — SIMVASTATIN 40 MG PO TABS: 40.0000 mg | ORAL_TABLET | Freq: Every day | ORAL | Status: DC

## 2015-03-14 MED ORDER — OMEPRAZOLE 20 MG PO CPDR: 20.0000 mg | DELAYED_RELEASE_CAPSULE | Freq: Every day | ORAL | Status: DC

## 2015-03-14 MED ORDER — BENAZEPRIL HCL 20 MG PO TABS: 20.0000 mg | ORAL_TABLET | Freq: Every day | ORAL | Status: DC

## 2015-03-14 MED ORDER — METOPROLOL SUCCINATE ER 50 MG PO TB24: 50.0000 mg | ORAL_TABLET | Freq: Every day | ORAL | Status: DC

## 2015-03-14 NOTE — Telephone Encounter (Signed)
Rx's sent, 90 day supply and 1 RF.

## 2015-03-14 NOTE — Telephone Encounter (Signed)
Relation to [id number] Call back number:[phone] Pharmacy: [facility] [place], [place]. [phone] (Phone) [phone] (Fax)         Reason for call:   Patient requesting a 90 day supply of the following medication:  simvastatin (ZOCOR) 40 MG tablet  benazepril (LOTENSIN) 20 MG tablet  metoprolol succinate (TOPROL-XL) 50 MG 24 hr tablet  NIFEdipine (NIFEDICAL XL) 60 MG 24 hr tablet  omeprazole (PRILOSEC) 20 MG capsule (patient states he would like to start medication again)

## 2015-04-16 ENCOUNTER — Encounter: Payer: Self-pay | Admitting: Medical

## 2015-04-16 ENCOUNTER — Ambulatory Visit (INDEPENDENT_AMBULATORY_CARE_PROVIDER_SITE_OTHER): Payer: PPO | Admitting: Medical

## 2015-04-16 VITALS — BP 116/74 | HR 78 | Temp 98.0°F | Ht 68.0 in | Wt 203.0 lb

## 2015-04-16 DIAGNOSIS — R591 Generalized enlarged lymph nodes: Secondary | ICD-10-CM

## 2015-04-16 DIAGNOSIS — K1120 Sialoadenitis, unspecified: Secondary | ICD-10-CM

## 2015-04-16 LAB — CBC WITH DIFFERENTIAL/PLATELET
BASOS PCT: 0.4 % (ref 0.0–3.0)
Basophils Absolute: 0 10*3/uL (ref 0.0–0.1)
EOS PCT: 1.3 % (ref 0.0–5.0)
Eosinophils Absolute: 0.1 10*3/uL (ref 0.0–0.7)
HEMATOCRIT: 49.5 % (ref 39.0–52.0)
HEMOGLOBIN: 16.6 g/dL (ref 13.0–17.0)
LYMPHS PCT: 23 % (ref 12.0–46.0)
Lymphs Abs: 1.7 10*3/uL (ref 0.7–4.0)
MCHC: 33.5 g/dL (ref 30.0–36.0)
MCV: 90.1 fl (ref 78.0–100.0)
MONOS PCT: 8.4 % (ref 3.0–12.0)
Monocytes Absolute: 0.6 10*3/uL (ref 0.1–1.0)
Neutro Abs: 4.8 10*3/uL (ref 1.4–7.7)
Neutrophils Relative %: 66.9 % (ref 43.0–77.0)
Platelets: 209 10*3/uL (ref 150.0–400.0)
RBC: 5.5 Mil/uL (ref 4.22–5.81)
RDW: 13.8 % (ref 11.5–15.5)
WBC: 7.2 10*3/uL (ref 4.0–10.5)

## 2015-04-16 MED ORDER — CEPHALEXIN 500 MG PO CAPS: 500.0000 mg | ORAL_CAPSULE | Freq: Two times a day (BID) | ORAL | Status: DC

## 2015-04-16 NOTE — Progress Notes (Signed)
Pre visit review using our clinic review tool, if applicable. No additional management support is needed unless otherwise documented below in the visit note. 

## 2015-04-16 NOTE — Patient Instructions (Addendum)
For swelling recently around parotid gland, I want you to do warm compresses twice daily.  Can use lozenge or lemon drop type  to promote salivation.  Can continue alleve.  Rx of cephalexin antibiotic.  Will get lab to assess infection fighting cells since lymph node on rt side mild swollen and tender.  Follow up in 10 days to recheck both parotid gland and lymph node.  If tenderness or swelling still persist consider referral to ENT.

## 2015-04-16 NOTE — Progress Notes (Signed)
Subjective:    Patient ID: Ronald [name], male    DOB: 06-10-1934, 81 y.o.   MRN: [id number]  HPI  Pt in with some swelling on rt side of his face of his face. Points to rt side parotid. He had moderate pain on Saturday when this first came up. Less pain since then. Swelling has gone down.  Pt only did  salt rinse and alleve.   Review of Systems  Constitutional: Negative for fever, chills and fatigue.  HENT: Negative for congestion, ear pain, hearing loss, mouth sores, postnasal drip, rhinorrhea, sinus pressure and sore throat.        See hpi  Respiratory: Negative for cough, choking, shortness of breath and wheezing.   Cardiovascular: Negative for chest pain and palpitations.  Gastrointestinal: Negative for abdominal pain.  Psychiatric/Behavioral: Negative for behavioral problems and confusion.   Past Medical History  Diagnosis Date  . GERD (gastroesophageal reflux disease)   . Hyperlipidemia   . Hypertension   . Hypercalcemia     parathyroidectomy 04/2008  . BPH (benign prostatic hypertrophy)     f/u @ Biopsy, s/p bx 2009 apro (-)  . Hemorrhoids   . Complication of anesthesia     violent upon waking up  . Calculus of kidney     s/p R percutaneus nephrostomy @ [facility]  . Diabetes mellitus     pre diabetic  . Iron deficiency anemia   . Colon polyp   . Diverticulosis   . Cataract   . Edema leg     asymetric , L > R , [place] neg for DVT 09-2010    Social History   Social History  . Marital Status: Widowed    Spouse Name: N/A  . Number of Children: 3  . Years of Education: N/A   Occupational History  . retired     Social History Main Topics  . Smoking status: Former Smoker -- 25 years    Types: Cigarettes    Quit date: 11/30/1981  . Smokeless tobacco: Never Used  . Alcohol Use: 4.2 oz/week    7 Glasses of wine per week     Comment: 1 glass of wine or beer with dinner  . Drug Use: No  . Sexual Activity: Not on file   Other Topics Concern  . Not on file     Social History Narrative   Retired, single, has  a girlfriend , 3 children  (one lives in town)   Daughter has a copy of his living will    Past Surgical History  Procedure Laterality Date  . Appendectomy    . Tonsillectomy    . Parathyroidectomy  04/2008  . Kidney stone surgery    . Colonoscopy  12/16/2011    Procedure: COLONOSCOPY;  Surgeon: Inda Castle, MD;  Location: [facility];  Service: Endoscopy;  Laterality: N/A;  . Flexible sigmoidoscopy  02/09/2012    Procedure: FLEXIBLE SIGMOIDOSCOPY;  Surgeon: Inda Castle, MD;  Location: [facility];  Service: Endoscopy;  Laterality: N/A;  . Hemorrhoid banding  02/09/2012    Procedure: HEMORRHOID BANDING;  Surgeon: Inda Castle, MD;  Location: [facility];  Service: Endoscopy;  Laterality: N/A;  . Cataract extraction Right   . Eye surgery  infant    "lazy eye"  . Esophagogastroduodenoscopy N/A 01/17/2013    Procedure: ESOPHAGOGASTRODUODENOSCOPY (EGD);  Surgeon: Inda Castle, MD;  Location: [facility];  Service: Endoscopy;  Laterality: N/A;  . Hot hemostasis N/A 01/17/2013  Procedure: HOT HEMOSTASIS (ARGON PLASMA COAGULATION/BICAP);  Surgeon: Inda Castle, MD;  Location: [facility];  Service: Endoscopy;  Laterality: N/A;    Family History  Problem Relation Age of Onset  . Heart attack Father 13  . Heart attack Mother   . Colon cancer Neg Hx   . Prostate cancer Neg Hx   . Diabetes Neg Hx     Allergies  Allergen Reactions  . Cucumber Extract Nausea And Vomiting  . Sulfonamide Derivatives Other (See Comments)    "don't know reaction" per pt    Current Outpatient Prescriptions on File Prior to Visit  Medication Sig Dispense Refill  . benazepril (LOTENSIN) 20 MG tablet Take 1 tablet (20 mg total) by mouth daily. 90 tablet 1  . cholecalciferol (VITAMIN D) 1000 UNITS tablet Take 1,000 Units by mouth daily.    Marland Kitchen esomeprazole (NEXIUM) 20 MG capsule Take 1 capsule (20 mg total) by mouth daily at 12 noon. 90  capsule 3  . metoprolol succinate (TOPROL-XL) 50 MG 24 hr tablet Take 1 tablet (50 mg total) by mouth daily. 90 tablet 1  . NIFEdipine (NIFEDICAL XL) 60 MG 24 hr tablet Take 1 tablet (60 mg total) by mouth daily. 90 tablet 1  . omeprazole (PRILOSEC) 20 MG capsule Take 1 capsule (20 mg total) by mouth daily. 90 capsule 1  . saw palmetto 160 MG capsule Take 540 mg by mouth daily.     . simvastatin (ZOCOR) 40 MG tablet Take 1 tablet (40 mg total) by mouth at bedtime. 90 tablet 1   No current facility-administered medications on file prior to visit.    BP 116/74 mmHg  Pulse 78  Temp(Src) 98 F (36.7 C) (Oral)  Ht 5\' 8"  (1.727 m)  Wt 203 lb (92.08 kg)  BMI 30.87 kg/m2  SpO2 97%       Objective:   Physical Exam   General  Mental Status - Alert. General Appearance - Well groomed. Not in acute distress.  Skin Rashes- No Rashes. (rt parotid area appears faint swollen on close inpsection but no tenderness)  HEENT(rt parotid area appears faint swollen on close inpsection but no tenderness) Head- Normal. Ear Auditory Canal - Left- Normal. Right - Normal.Tympanic Membrane- Left- Normal. Right- Normal. Eye Sclera/Conjunctiva- Left- Normal. Right- Normal. Nose & Sinuses Nasal Mucosa- Left-  Not oggy or Congested. Right-  Not  boggy or Congested. No sinus pressure Mouth & Throat Lips: Upper Lip- Normal: no dryness, cracking, pallor, cyanosis, or vesicular eruption. Lower Lip-Normal: no dryness, cracking, pallor, cyanosis or vesicular eruption. Buccal Mucosa- Bilateral- No Aphthous ulcers. Oropharynx- No Discharge or Erythema. Tonsils: Characteristics- Bilateral- No Erythema or Congestion. Size/Enlargement- Bilateral- No enlargement. Discharge- bilateral-None.  Neck Neck- Supple. No Masses. Rt submandibular node mild enlarged and tender to palpation.   Chest and Lung Exam Auscultation: Breath Sounds:- even and unlabored  Cardiovascular Auscultation:Rythm- Regular, rate and  rhythm. Murmurs & Other Heart Sounds:Ausculatation of the heart reveal- No Murmurs.  Lymphatic Head & Neck General Head & Neck Lymphatics: Bilateral: Description- see neck exam.      Assessment & Plan:  For swelling recently around parotid gland, I want you to do warm compresses twice daily.  Can use lozenge or lemon drop type  to promote salivation.  Can continue alleve.  Rx of cephalexin antibiotic.  Will get lab to assess infection fighting cells since lymph node on rt side mild swollen and tender.  Follow up in 10 days to recheck both parotid gland and  lymph node.  If tenderness or swelling still persist consider referral to ENT.

## 2015-04-26 ENCOUNTER — Encounter: Payer: Self-pay | Admitting: Medical

## 2015-04-26 ENCOUNTER — Ambulatory Visit (INDEPENDENT_AMBULATORY_CARE_PROVIDER_SITE_OTHER): Payer: PPO | Admitting: Medical

## 2015-04-26 VITALS — BP 112/74 | HR 80 | Temp 97.7°F | Ht 68.0 in | Wt 209.8 lb

## 2015-04-26 DIAGNOSIS — K1120 Sialoadenitis, unspecified: Secondary | ICD-10-CM | POA: Diagnosis not present

## 2015-04-26 NOTE — Progress Notes (Signed)
Pre visit review using our clinic review tool, if applicable. No additional management support is needed unless otherwise documented below in the visit note. 

## 2015-04-26 NOTE — Patient Instructions (Signed)
Your parotitis appears resolved 100%. No lymph nodes swelling, mouth exam negative and blood work negative. So no further treatment needed. If any recurrent signs or symptoms then please return.

## 2015-04-26 NOTE — Progress Notes (Signed)
Subjective:    Patient ID: Ronald [name], male    DOB: October 15, 1934, 81 y.o.   MRN: [id number]  HPI  Pt in with follow up. No pain, no swelling in  Rt parotid area or lymph node. Took 3-4 days with cephalexin before symptom resolution(he states to promote salivation ate oranges). No residual symptoms now. Rx was for 10 days of cephalexin. No side effects reported.  Review of Systems  Constitutional: Negative for fever, chills and fatigue.  HENT: Negative for congestion, ear pain, hearing loss and mouth sores.   Respiratory: Negative for cough, chest tightness, shortness of breath and wheezing.   Cardiovascular: Negative for chest pain and palpitations.  Hematological: Negative for adenopathy.   Past Medical History  Diagnosis Date  . GERD (gastroesophageal reflux disease)   . Hyperlipidemia   . Hypertension   . Hypercalcemia     parathyroidectomy 04/2008  . BPH (benign prostatic hypertrophy)     f/u @ Biopsy, s/p bx 2009 apro (-)  . Hemorrhoids   . Complication of anesthesia     violent upon waking up  . Calculus of kidney     s/p R percutaneus nephrostomy @ [facility]  . Diabetes mellitus     pre diabetic  . Iron deficiency anemia   . Colon polyp   . Diverticulosis   . Cataract   . Edema leg     asymetric , L > R , [place] neg for DVT 09-2010    Social History   Social History  . Marital Status: Widowed    Spouse Name: N/A  . Number of Children: 3  . Years of Education: N/A   Occupational History  . retired     Social History Main Topics  . Smoking status: Former Smoker -- 25 years    Types: Cigarettes    Quit date: 11/30/1981  . Smokeless tobacco: Never Used  . Alcohol Use: 4.2 oz/week    7 Glasses of wine per week     Comment: 1 glass of wine or beer with dinner  . Drug Use: No  . Sexual Activity: Not on file   Other Topics Concern  . Not on file   Social History Narrative   Retired, single, has  a girlfriend , 3 children  (one lives in town)   Daughter  has a copy of his living will    Past Surgical History  Procedure Laterality Date  . Appendectomy    . Tonsillectomy    . Parathyroidectomy  04/2008  . Kidney stone surgery    . Colonoscopy  12/16/2011    Procedure: COLONOSCOPY;  Surgeon: Inda Castle, MD;  Location: [facility];  Service: Endoscopy;  Laterality: N/A;  . Flexible sigmoidoscopy  02/09/2012    Procedure: FLEXIBLE SIGMOIDOSCOPY;  Surgeon: Inda Castle, MD;  Location: [facility];  Service: Endoscopy;  Laterality: N/A;  . Hemorrhoid banding  02/09/2012    Procedure: HEMORRHOID BANDING;  Surgeon: Inda Castle, MD;  Location: [facility];  Service: Endoscopy;  Laterality: N/A;  . Cataract extraction Right   . Eye surgery  infant    "lazy eye"  . Esophagogastroduodenoscopy N/A 01/17/2013    Procedure: ESOPHAGOGASTRODUODENOSCOPY (EGD);  Surgeon: Inda Castle, MD;  Location: [facility];  Service: Endoscopy;  Laterality: N/A;  . Hot hemostasis N/A 01/17/2013    Procedure: HOT HEMOSTASIS (ARGON PLASMA COAGULATION/BICAP);  Surgeon: Inda Castle, MD;  Location: [facility];  Service: Endoscopy;  Laterality: N/A;  Family History  Problem Relation Age of Onset  . Heart attack Father 2  . Heart attack Mother   . Colon cancer Neg Hx   . Prostate cancer Neg Hx   . Diabetes Neg Hx     Allergies  Allergen Reactions  . Cucumber Extract Nausea And Vomiting  . Sulfonamide Derivatives Other (See Comments)    "don't know reaction" per pt    Current Outpatient Prescriptions on File Prior to Visit  Medication Sig Dispense Refill  . benazepril (LOTENSIN) 20 MG tablet Take 1 tablet (20 mg total) by mouth daily. 90 tablet 1  . cholecalciferol (VITAMIN D) 1000 UNITS tablet Take 1,000 Units by mouth daily.    Marland Kitchen esomeprazole (NEXIUM) 20 MG capsule Take 1 capsule (20 mg total) by mouth daily at 12 noon. 90 capsule 3  . metoprolol succinate (TOPROL-XL) 50 MG 24 hr tablet Take 1 tablet (50 mg total) by mouth daily. 90  tablet 1  . NIFEdipine (NIFEDICAL XL) 60 MG 24 hr tablet Take 1 tablet (60 mg total) by mouth daily. 90 tablet 1  . omeprazole (PRILOSEC) 20 MG capsule Take 1 capsule (20 mg total) by mouth daily. 90 capsule 1  . saw palmetto 160 MG capsule Take 540 mg by mouth daily.     . simvastatin (ZOCOR) 40 MG tablet Take 1 tablet (40 mg total) by mouth at bedtime. 90 tablet 1   No current facility-administered medications on file prior to visit.    BP 112/74 mmHg  Pulse 80  Temp(Src) 97.7 F (36.5 C) (Oral)  Ht 5\' 8"  (1.727 m)  Wt 209 lb 12.8 oz (95.165 kg)  BMI 31.91 kg/m2  SpO2 98%      Objective:   Physical Exam  General  Mental Status - Alert. General Appearance - Well groomed. Not in acute distress.  Skin Rashes- No Rashes.  HEENT Head- Normal. Ear Auditory Canal - Left- Normal. Right - Normal.Tympanic Membrane- Left- Normal. Right- Normal. Eye Sclera/Conjunctiva- Left- Normal. Right- Normal. Nose & Sinuses Nasal Mucosa- Left-  Boggy and Congested. Right-  Boggy and  Congested.Bilateral maxillary and frontal sinus pressure. Mouth & Throat Lips: Upper Lip- Normal: no dryness, cracking, pallor, cyanosis, or vesicular eruption. Lower Lip-Normal: no dryness, cracking, pallor, cyanosis or vesicular eruption. Buccal Mucosa- Bilateral- No Aphthous ulcers.(on palpation of rt side buccal mucosa normal. Nml by inspection as well.) Oropharynx- No Discharge or Erythema. Tonsils: Characteristics- Bilateral- No Erythema or Congestion. Size/Enlargement- Bilateral- No enlargement. Discharge- bilateral-None.  Rt parotid region is no longer swollen by palpation or inspection  Neck Neck- Supple. No Masses.(rt submandibular node no longer swollen. No tenderness)   Chest and Lung Exam Auscultation: Breath Sounds:-Clear even and unlabored.  Cardiovascular Auscultation:Rythm- Regular, rate and rhythm. Murmurs & Other Heart Sounds:Ausculatation of the heart reveal- No  Murmurs.  Lymphatic Head & Neck General Head & Neck Lymphatics: Bilateral: Description- No Localized lymphadenopathy.       Assessment & Plan:  Your parotitis appears resolved 100%. No lymph nodes swelling, mouth exam negative and blood work negative. So no further treatment needed. If any recurrent signs or symptoms then please return.

## 2015-05-03 ENCOUNTER — Ambulatory Visit: Payer: Medicare HMO | Admitting: Internal Medicine

## 2015-05-26 ENCOUNTER — Encounter: Payer: Self-pay | Admitting: Internal Medicine

## 2015-05-28 ENCOUNTER — Encounter: Payer: Self-pay | Admitting: Internal Medicine

## 2015-06-29 ENCOUNTER — Ambulatory Visit (INDEPENDENT_AMBULATORY_CARE_PROVIDER_SITE_OTHER): Payer: PPO | Admitting: Internal Medicine

## 2015-06-29 ENCOUNTER — Encounter: Payer: Self-pay | Admitting: Internal Medicine

## 2015-06-29 VITALS — BP 126/76 | HR 60 | Temp 97.7°F | Ht 68.0 in | Wt 207.5 lb

## 2015-06-29 DIAGNOSIS — E119 Type 2 diabetes mellitus without complications: Secondary | ICD-10-CM | POA: Diagnosis not present

## 2015-06-29 DIAGNOSIS — I10 Essential (primary) hypertension: Secondary | ICD-10-CM

## 2015-06-29 DIAGNOSIS — Z09 Encounter for follow-up examination after completed treatment for conditions other than malignant neoplasm: Secondary | ICD-10-CM

## 2015-06-29 LAB — HEMOGLOBIN A1C: HEMOGLOBIN A1C: 6.3 % (ref 4.6–6.5)

## 2015-06-29 LAB — BASIC METABOLIC PANEL
BUN: 25 mg/dL — ABNORMAL HIGH (ref 6–23)
CALCIUM: 9.3 mg/dL (ref 8.4–10.5)
CO2: 30 mEq/L (ref 19–32)
Chloride: 104 mEq/L (ref 96–112)
Creatinine, Ser: 1.24 mg/dL (ref 0.40–1.50)
GFR: 59.79 mL/min — AB (ref >60.00)
Glucose, Bld: 102 mg/dL — ABNORMAL HIGH (ref 70–99)
POTASSIUM: 4.2 meq/L (ref 3.5–5.1)
SODIUM: 142 meq/L (ref 135–145)

## 2015-06-29 NOTE — Progress Notes (Signed)
Pre visit review using our clinic review tool, if applicable. No additional management support is needed unless otherwise documented below in the visit note. 

## 2015-06-29 NOTE — Patient Instructions (Signed)
GO TO THE LAB :      Get the blood work     GO TO THE FRONT DESK  Schedule your next appointment for a  Physical exam, fasting in 6 months

## 2015-06-29 NOTE — Progress Notes (Signed)
Subjective:    Patient ID: Ronald [name], male    DOB: [birth year], 81 y.o.   MRN: [id number]  DOS:  06/29/2015 Type of visit - description :  Routine checkup Interval history: In general feeling well, no major concerns HTN: Good medication compliance, no ambulatory BPs High cholesterol: Good medication compliance with statins, no apparent side effects Osteopenia: Taking vitamin D regularly   Review of Systems Sedentary lfestyle for the most part, not interested in changing.  Diet is regular   Past Medical History  Diagnosis Date  . GERD (gastroesophageal reflux disease)   . Hyperlipidemia   . Hypertension   . Hypercalcemia     parathyroidectomy 04/2008  . BPH (benign prostatic hypertrophy)     f/u @ Biopsy, s/p bx 2009 apro (-)  . Hemorrhoids   . Complication of anesthesia     violent upon waking up  . Calculus of kidney     s/p R percutaneus nephrostomy @ [facility]  . Diabetes mellitus     pre diabetic  . Iron deficiency anemia   . Colon polyp   . Diverticulosis   . Cataract   . Edema leg     asymetric , L > R , [place] neg for DVT 09-2010    Past Surgical History  Procedure Laterality Date  . Appendectomy    . Tonsillectomy    . Parathyroidectomy  04/2008  . Kidney stone surgery    . Colonoscopy  12/16/2011    Procedure: COLONOSCOPY;  Surgeon: Inda Castle, MD;  Location: [facility];  Service: Endoscopy;  Laterality: N/A;  . Flexible sigmoidoscopy  02/09/2012    Procedure: FLEXIBLE SIGMOIDOSCOPY;  Surgeon: Inda Castle, MD;  Location: [facility];  Service: Endoscopy;  Laterality: N/A;  . Hemorrhoid banding  02/09/2012    Procedure: HEMORRHOID BANDING;  Surgeon: Inda Castle, MD;  Location: [facility];  Service: Endoscopy;  Laterality: N/A;  . Cataract extraction Right   . Eye surgery  infant    "lazy eye"  . Esophagogastroduodenoscopy N/A 01/17/2013    Procedure: ESOPHAGOGASTRODUODENOSCOPY (EGD);  Surgeon: Inda Castle, MD;  Location: [facility];   Service: Endoscopy;  Laterality: N/A;  . Hot hemostasis N/A 01/17/2013    Procedure: HOT HEMOSTASIS (ARGON PLASMA COAGULATION/BICAP);  Surgeon: Inda Castle, MD;  Location: [facility];  Service: Endoscopy;  Laterality: N/A;    Social History   Social History  . Marital Status: Widowed    Spouse Name: N/A  . Number of Children: 3  . Years of Education: N/A   Occupational History  . retired     Social History Main Topics  . Smoking status: Former Smoker -- 25 years    Types: Cigarettes    Quit date: 11/30/1981  . Smokeless tobacco: Never Used  . Alcohol Use: 4.2 oz/week    7 Glasses of wine per week     Comment: 1 glass of wine or beer with dinner  . Drug Use: No  . Sexual Activity: Not on file   Other Topics Concern  . Not on file   Social History Narrative   Retired, single, has  a girlfriend , 3 children  (one lives in town)   Daughter has a copy of his living will        Medication List       This list is accurate as of: 06/29/15 11:59 PM.  Always use your most recent med list.  benazepril 20 MG tablet  Commonly known as:  LOTENSIN  Take 1 tablet (20 mg total) by mouth daily.     cholecalciferol 1000 units tablet  Commonly known as:  VITAMIN D  Take 1,000 Units by mouth daily.     esomeprazole 20 MG capsule  Commonly known as:  NEXIUM  Take 1 capsule (20 mg total) by mouth daily at 12 noon.     metoprolol succinate 50 MG 24 hr tablet  Commonly known as:  TOPROL-XL  Take 1 tablet (50 mg total) by mouth daily.     NIFEdipine 60 MG 24 hr tablet  Commonly known as:  NIFEDICAL XL  Take 1 tablet (60 mg total) by mouth daily.     omeprazole 20 MG capsule  Commonly known as:  PRILOSEC  Take 1 capsule (20 mg total) by mouth daily.     saw palmetto 160 MG capsule  Take 540 mg by mouth daily.     simvastatin 40 MG tablet  Commonly known as:  ZOCOR  Take 1 tablet (40 mg total) by mouth at bedtime.           Objective:    Physical Exam BP 126/76 mmHg  Pulse 60  Temp(Src) 97.7 F (36.5 C) (Oral)  Ht 5\' 8"  (1.727 m)  Wt 207 lb 8 oz (94.121 kg)  BMI 31.56 kg/m2  SpO2 96% General:   Well developed, well nourished . NAD.  HEENT:  Normocephalic . Face symmetric, atraumatic Lungs:  CTA B Normal respiratory effort, no intercostal retractions, no accessory muscle use. Heart: RRR,  no murmur.  no pretibial edema bilaterally  Abdomen:  Not distended, soft, non-tender. No rebound or rigidity.   Skin: Not pale. Not jaundice Neurologic:  alert & oriented X3.  Speech normal, gait appropriate for age and unassisted Psych--  Cognition and judgment appear intact.  Cooperative with normal attention span and concentration.  Behavior appropriate. No anxious or depressed appearing.      Assessment & Plan:  Assessment > DM HTN Hyperlipidemia Osteopenia T score -2.0 (2010), T score -1.6 (12-2014) LEG Edema,L>R , chronic, [place] neg DVT 2012 GU: --BPH  --ED --Elevated PSA, s/p multiple bx (last 2009?neg), last OV w/ urology 2014, last  PSA 2014 ~ 13 Hypercalcemia, parathyroidectomy 2010 Renal stones) time as nephrostomy 2009 at [place]: --Anemia: EGD 12-2012  (Bx: H pylory neg) [id number] gastric polyposis, Bx H Pylory +, was rec treatment Anemia felt to be d/t chronic bleed from  gastric polyp H Pylori + 11-2014, treated, f/u breath test (-)  --GERD --cscope 12-2011 >>> multiple hyperplastic  Polyps, tics  --12-2011 Banding internal hemorrhoids 4 --02-2012: Flex sig d/t discomfort   PLAN  DM: Due for A1c, diet and exercise discussed, he is not interested in change his lifestyle at this point. Benefits discussed. HTN: Continue benazepril, check a BMP Gastric polyposis: After the last visit, H. pylori test was +, treated, follow-up test negative. Patient asymptomatic, continue PPIs Osteopenia: Last T score -1.6, on calcium and vitamin D RTC 6 months, CPX

## 2015-06-30 NOTE — Assessment & Plan Note (Signed)
DM: Due for A1c, diet and exercise discussed, he is not interested in change his lifestyle at this point. Benefits discussed. HTN: Continue benazepril, check a BMP Gastric polyposis: After the last visit, H. pylori test was +, treated, follow-up test negative. Patient asymptomatic, continue PPIs Osteopenia: Last T score -1.6, on calcium and vitamin D RTC 6 months, CPX

## 2015-07-03 DIAGNOSIS — H2513 Age-related nuclear cataract, bilateral: Secondary | ICD-10-CM | POA: Diagnosis not present

## 2015-07-03 DIAGNOSIS — H43813 Vitreous degeneration, bilateral: Secondary | ICD-10-CM | POA: Diagnosis not present

## 2015-07-03 DIAGNOSIS — Z961 Presence of intraocular lens: Secondary | ICD-10-CM | POA: Diagnosis not present

## 2015-07-03 DIAGNOSIS — H401112 Primary open-angle glaucoma, right eye, moderate stage: Secondary | ICD-10-CM | POA: Diagnosis not present

## 2015-07-03 DIAGNOSIS — H02831 Dermatochalasis of right upper eyelid: Secondary | ICD-10-CM | POA: Diagnosis not present

## 2015-07-03 DIAGNOSIS — H401121 Primary open-angle glaucoma, left eye, mild stage: Secondary | ICD-10-CM | POA: Diagnosis not present

## 2015-07-03 DIAGNOSIS — H02834 Dermatochalasis of left upper eyelid: Secondary | ICD-10-CM | POA: Diagnosis not present

## 2015-07-03 DIAGNOSIS — H2512 Age-related nuclear cataract, left eye: Secondary | ICD-10-CM | POA: Diagnosis not present

## 2015-09-15 ENCOUNTER — Encounter: Payer: Self-pay | Admitting: Internal Medicine

## 2015-09-15 DIAGNOSIS — I10 Essential (primary) hypertension: Secondary | ICD-10-CM

## 2015-09-17 MED ORDER — NIFEDIPINE ER OSMOTIC RELEASE 60 MG PO TB24: 60.0000 mg | ORAL_TABLET | Freq: Every day | ORAL | Status: DC

## 2015-09-17 MED ORDER — OMEPRAZOLE 20 MG PO CPDR: 20.0000 mg | DELAYED_RELEASE_CAPSULE | Freq: Every day | ORAL | Status: DC

## 2015-09-17 NOTE — Telephone Encounter (Signed)
Omeprazole and Nifedipine sent to [facility]. Pt informed via MyChart.

## 2015-09-29 ENCOUNTER — Encounter: Payer: Self-pay | Admitting: Internal Medicine

## 2015-10-01 MED ORDER — METOPROLOL SUCCINATE ER 50 MG PO TB24: 50.0000 mg | ORAL_TABLET | Freq: Every day | ORAL | 2 refills | Status: DC

## 2015-10-01 NOTE — Telephone Encounter (Signed)
Metoprolol Rx sent to pharmacy. Pt informed via MyChart.

## 2015-10-04 DIAGNOSIS — H401121 Primary open-angle glaucoma, left eye, mild stage: Secondary | ICD-10-CM | POA: Diagnosis not present

## 2015-10-04 DIAGNOSIS — H2512 Age-related nuclear cataract, left eye: Secondary | ICD-10-CM | POA: Diagnosis not present

## 2015-10-04 DIAGNOSIS — H401112 Primary open-angle glaucoma, right eye, moderate stage: Secondary | ICD-10-CM | POA: Diagnosis not present

## 2015-10-04 DIAGNOSIS — H02831 Dermatochalasis of right upper eyelid: Secondary | ICD-10-CM | POA: Diagnosis not present

## 2015-10-04 DIAGNOSIS — H02834 Dermatochalasis of left upper eyelid: Secondary | ICD-10-CM | POA: Diagnosis not present

## 2015-10-04 DIAGNOSIS — Z961 Presence of intraocular lens: Secondary | ICD-10-CM | POA: Diagnosis not present

## 2015-10-17 ENCOUNTER — Other Ambulatory Visit: Payer: Self-pay | Admitting: Dermatology

## 2015-10-17 DIAGNOSIS — L603 Nail dystrophy: Secondary | ICD-10-CM | POA: Diagnosis not present

## 2015-10-17 DIAGNOSIS — C44219 Basal cell carcinoma of skin of left ear and external auricular canal: Secondary | ICD-10-CM | POA: Diagnosis not present

## 2015-10-17 DIAGNOSIS — D239 Other benign neoplasm of skin, unspecified: Secondary | ICD-10-CM | POA: Diagnosis not present

## 2015-10-17 DIAGNOSIS — D179 Benign lipomatous neoplasm, unspecified: Secondary | ICD-10-CM | POA: Diagnosis not present

## 2015-11-24 ENCOUNTER — Other Ambulatory Visit: Payer: Self-pay | Admitting: Internal Medicine

## 2015-11-26 ENCOUNTER — Encounter: Payer: Self-pay | Admitting: Internal Medicine

## 2016-01-03 ENCOUNTER — Encounter: Payer: PPO | Admitting: Internal Medicine

## 2016-01-08 DIAGNOSIS — C44219 Basal cell carcinoma of skin of left ear and external auricular canal: Secondary | ICD-10-CM | POA: Diagnosis not present

## 2016-01-09 ENCOUNTER — Encounter: Payer: PPO | Admitting: Internal Medicine

## 2016-01-09 HISTORY — PX: EXTERNAL EAR SURGERY: SHX627

## 2016-02-08 ENCOUNTER — Encounter: Payer: Self-pay | Admitting: Internal Medicine

## 2016-02-08 ENCOUNTER — Ambulatory Visit (INDEPENDENT_AMBULATORY_CARE_PROVIDER_SITE_OTHER): Payer: PPO | Admitting: Internal Medicine

## 2016-02-08 VITALS — BP 120/76 | HR 79 | Temp 97.8°F | Resp 12 | Ht 68.0 in | Wt 210.1 lb

## 2016-02-08 DIAGNOSIS — I499 Cardiac arrhythmia, unspecified: Secondary | ICD-10-CM | POA: Diagnosis not present

## 2016-02-08 DIAGNOSIS — I10 Essential (primary) hypertension: Secondary | ICD-10-CM | POA: Diagnosis not present

## 2016-02-08 DIAGNOSIS — Z23 Encounter for immunization: Secondary | ICD-10-CM

## 2016-02-08 DIAGNOSIS — E785 Hyperlipidemia, unspecified: Secondary | ICD-10-CM | POA: Diagnosis not present

## 2016-02-08 DIAGNOSIS — E119 Type 2 diabetes mellitus without complications: Secondary | ICD-10-CM

## 2016-02-08 DIAGNOSIS — E118 Type 2 diabetes mellitus with unspecified complications: Secondary | ICD-10-CM

## 2016-02-08 DIAGNOSIS — Z0000 Encounter for general adult medical examination without abnormal findings: Secondary | ICD-10-CM

## 2016-02-08 DIAGNOSIS — I4891 Unspecified atrial fibrillation: Secondary | ICD-10-CM

## 2016-02-08 LAB — COMPREHENSIVE METABOLIC PANEL
ALT: 21 U/L (ref 0–53)
AST: 17 U/L (ref 0–37)
Albumin: 3.9 g/dL (ref 3.5–5.2)
Alkaline Phosphatase: 62 U/L (ref 39–117)
BILIRUBIN TOTAL: 0.8 mg/dL (ref 0.2–1.2)
BUN: 27 mg/dL — AB (ref 6–23)
CO2: 31 meq/L (ref 19–32)
Calcium: 9.7 mg/dL (ref 8.4–10.5)
Chloride: 105 mEq/L (ref 96–112)
Creatinine, Ser: 1.42 mg/dL (ref 0.40–1.50)
GFR: 51.05 mL/min — AB (ref >60.00)
GLUCOSE: 121 mg/dL — AB (ref 70–99)
Potassium: 5.1 mEq/L (ref 3.5–5.1)
SODIUM: 144 meq/L (ref 135–145)
Total Protein: 7.3 g/dL (ref 6.0–8.3)

## 2016-02-08 LAB — CBC WITH DIFFERENTIAL/PLATELET
BASOS ABS: 0 10*3/uL (ref 0.0–0.1)
Basophils Relative: 0.4 % (ref 0.0–3.0)
Eosinophils Absolute: 0.2 10*3/uL (ref 0.0–0.7)
Eosinophils Relative: 2.1 % (ref 0.0–5.0)
HCT: 53.1 % — ABNORMAL HIGH (ref 39.0–52.0)
Hemoglobin: 17.8 g/dL — ABNORMAL HIGH (ref 13.0–17.0)
LYMPHS ABS: 2.2 10*3/uL (ref 0.7–4.0)
Lymphocytes Relative: 25.1 % (ref 12.0–46.0)
MCHC: 33.5 g/dL (ref 30.0–36.0)
MCV: 89.3 fl (ref 78.0–100.0)
MONO ABS: 0.6 10*3/uL (ref 0.1–1.0)
MONOS PCT: 6.8 % (ref 3.0–12.0)
NEUTROS PCT: 65.6 % (ref 43.0–77.0)
Neutro Abs: 5.9 10*3/uL (ref 1.4–7.7)
Platelets: 219 10*3/uL (ref 150.0–400.0)
RBC: 5.95 Mil/uL — AB (ref 4.22–5.81)
RDW: 14.2 % (ref 11.5–15.5)
WBC: 8.9 10*3/uL (ref 4.0–10.5)

## 2016-02-08 LAB — TSH: TSH: 1.53 u[IU]/mL (ref 0.35–4.50)

## 2016-02-08 LAB — LIPID PANEL
CHOL/HDL RATIO: 3
Cholesterol: 146 mg/dL (ref 0–200)
HDL: 45.7 mg/dL (ref >39.00)
LDL Cholesterol: 80 mg/dL (ref 0–99)
NONHDL: 100.16
Triglycerides: 102 mg/dL (ref 0.0–149.0)
VLDL: 20.4 mg/dL (ref 0.0–40.0)

## 2016-02-08 LAB — HEMOGLOBIN A1C: HEMOGLOBIN A1C: 6.2 % (ref 4.6–6.5)

## 2016-02-08 NOTE — Patient Instructions (Signed)
GO TO THE LAB : Get the blood work     GO TO THE FRONT DESK Schedule your next appointment for a  routine checkup in 6 months     Start taking aspirin 81 mg one tablet daily  We are going to schedule: A event monitor, echocardiogram and a visit with Dr. Caryl Comes in 3-4 weeks  If you have chest pain, difficulty breathing, palpitations, leg swelling: Let us know any time  Please schedule Medicare wellness with one of our nurses

## 2016-02-08 NOTE — Progress Notes (Signed)
Subjective:    Patient ID: Ronald [name], male    DOB: 09-09-1934, 81 y.o.   MRN: [id number]  DOS:  02/08/2016 Type of visit - description : Yearly checkup Interval history: High cholesterol: Good med compliance. Skin: Good medication compliance, no ambulatory BPs Osteopenia: Vitamin D, not taking calcium. DM: Diet control, doing great with diet, not exercising.   Review of Systems  In the last few months has noted some hair loss and hair  thinning and some tiredness  not associated with any other specific symptoms, see below.  Constitutional: No fever. No chills. No unexplained wt changes. No unusual sweats  HEENT: No dental problems, no ear discharge, no facial swelling, no voice changes. No eye discharge, no eye  redness , no  intolerance to light   Respiratory: No wheezing , no  difficulty breathing. No cough , no mucus production  Cardiovascular: No CP, no leg swelling , no  Palpitations  GI: no nausea, no vomiting, no diarrhea , no  abdominal pain.  No blood in the stools. No dysphagia, no odynophagia    Endocrine: No polyphagia, no polyuria , no polydipsia  GU: No dysuria, gross hematuria, difficulty urinating. No urinary urgency, no frequency.  Musculoskeletal: No joint swellings or unusual aches or pains  Skin: No change in the color of the skin, palor , no  Rash  Allergic, immunologic: No environmental allergies , no  food allergies  Neurological: No dizziness no  syncope. No headaches. No diplopia, no slurred, no slurred speech, no motor deficits, no facial  Numbness  Hematological: No enlarged lymph nodes, no easy bruising , no unusual bleedings  Psychiatry: No suicidal ideas, no hallucinations, no beavior problems, no confusion.  No unusual/severe anxiety, no depression    Past Medical History:  Diagnosis Date  . BPH (benign prostatic hypertrophy)    f/u @ Biopsy, s/p bx 2009 apro (-)  . Calculus of kidney    s/p R percutaneus nephrostomy @ [facility]  . Cataract   . Colon polyp   . Complication of anesthesia    violent upon waking up  . Diabetes mellitus    pre diabetic  . Diverticulosis   . Edema leg    asymetric , L > R , [place] neg for DVT 09-2010  . GERD (gastroesophageal reflux disease)   . Hemorrhoids   . Hypercalcemia    parathyroidectomy 04/2008  . Hyperlipidemia   . Hypertension   . Iron deficiency anemia     Past Surgical History:  Procedure Laterality Date  . APPENDECTOMY    . CATARACT EXTRACTION Right   . COLONOSCOPY  12/16/2011   Procedure: COLONOSCOPY;  Surgeon: Inda Castle, MD;  Location: [facility];  Service: Endoscopy;  Laterality: N/A;  . ESOPHAGOGASTRODUODENOSCOPY N/A 01/17/2013   Procedure: ESOPHAGOGASTRODUODENOSCOPY (EGD);  Surgeon: Inda Castle, MD;  Location: [facility];  Service: Endoscopy;  Laterality: N/A;  . EXTERNAL EAR SURGERY  01/2016   BCC removal  . EYE SURGERY  infant   "lazy eye"  . FLEXIBLE SIGMOIDOSCOPY  02/09/2012   Procedure: FLEXIBLE SIGMOIDOSCOPY;  Surgeon: Inda Castle, MD;  Location: [facility];  Service: Endoscopy;  Laterality: N/A;  . HEMORRHOID BANDING  02/09/2012   Procedure: HEMORRHOID BANDING;  Surgeon: Inda Castle, MD;  Location: [facility];  Service: Endoscopy;  Laterality: N/A;  . HOT HEMOSTASIS N/A 01/17/2013   Procedure: HOT HEMOSTASIS (ARGON PLASMA COAGULATION/BICAP);  Surgeon: Inda Castle, MD;  Location: [facility];  Service:  Endoscopy;  Laterality: N/A;  . KIDNEY STONE SURGERY    . PARATHYROIDECTOMY  04/2008  . TONSILLECTOMY      Social History   Social History  . Marital status: Widowed    Spouse name: N/A  . Number of children: 3  . Years of education: N/A   Occupational History  . retired     Social History Main Topics  . Smoking status: Former Smoker    Years: 25.00    Types: Cigarettes    Quit date: 11/30/1981  . Smokeless tobacco: Never Used  . Alcohol use 4.2 oz/week    7 Glasses of wine per week     Comment: 1 glass  of wine or beer with dinner  . Drug use: No  . Sexual activity: Not on file   Other Topics Concern  . Not on file   Social History Narrative   Retired, single, has  a girlfriend , 3 children  (one lives in town)   Daughter has a copy of his living will     Family History  Problem Relation Age of Onset  . Heart attack Father 20  . Heart attack Mother   . Colon cancer Neg Hx   . Prostate cancer Neg Hx   . Diabetes Neg Hx        Medication List       Accurate as of 02/08/16 11:59 PM. Always use your most recent med list.          aspirin EC 81 MG tablet Take 81 mg by mouth daily.   benazepril 20 MG tablet Commonly known as:  LOTENSIN Take 1 tablet (20 mg total) by mouth daily.   cholecalciferol 1000 units tablet Commonly known as:  VITAMIN D Take 1,000 Units by mouth daily.   esomeprazole 20 MG capsule Commonly known as:  NEXIUM Take 1 capsule (20 mg total) by mouth daily at 12 noon.   metoprolol succinate 50 MG 24 hr tablet Commonly known as:  TOPROL-XL Take 1 tablet (50 mg total) by mouth daily.   NIFEdipine 60 MG 24 hr tablet Commonly known as:  NIFEDICAL XL Take 1 tablet (60 mg total) by mouth daily.   omeprazole 20 MG capsule Commonly known as:  PRILOSEC Take 1 capsule (20 mg total) by mouth daily.   saw palmetto 160 MG capsule Take 540 mg by mouth daily.   simvastatin 40 MG tablet Commonly known as:  ZOCOR Take 1 tablet (40 mg total) by mouth at bedtime.          Objective:   Physical Exam BP 120/76 (BP Location: Right Arm, Patient Position: Sitting, Cuff Size: Normal)   Pulse 79   Temp 97.8 F (36.6 C) (Oral)   Resp 12   Ht 5\' 8"  (1.727 m)   Wt 210 lb 2 oz (95.3 kg)   SpO2 97%   BMI 31.95 kg/m   General:   Well developed, well nourished . NAD.  Neck: No  thyromegaly  HEENT:  Normocephalic . Face symmetric, atraumatic Lungs:  CTA B Normal respiratory effort, no intercostal retractions, no accessory muscle use. Heart:  Irregularly irregular.  no murmur.  No pretibial edema bilaterally  Abdomen:  Not distended, soft, non-tender. No rebound or rigidity.   Skin: Exposed areas without rash. Not pale. Not jaundice Neurologic:  alert & oriented X3.  Speech normal, gait appropriate for age and unassisted Strength symmetric and appropriate for age.  Psych: Cognition and judgment appear intact.  Cooperative with  normal attention span and concentration.  Behavior appropriate. No anxious or depressed appearing.    Assessment & Plan:   Assessment > DM HTN Hyperlipidemia Osteopenia T score -2.0 (2010), T score -1.6 (12-2014) LEG Edema,L>R , chronic, [place] neg DVT 2012 GU: --BPH  --ED --Elevated PSA, s/p multiple bx (last 2009?neg), last OV w/ urology 2014, last  PSA 2014 ~ 13, declines further eval as off 02-2016 Hypercalcemia, parathyroidectomy 2010 Renal stones) time as nephrostomy 2009 at [place]: --Anemia: EGD 12-2012  (Bx: H pylory neg) [id number] gastric polyposis, Bx H Pylory +, was rec treatment Anemia felt to be d/t chronic bleed from  gastric polyp H Pylori + 11-2014, treated, f/u breath test (-)  --GERD --cscope 12-2011 >>> multiple hyperplastic  Polyps, tics  --12-2011 Banding internal hemorrhoids 4 --02-2012: Flex sig d/t discomfort  BCC , removed from L ear   PLAN Arrhythmia: new issue, I suspected A Fib on physical exam, however the EKG showed tachycardia with pauses. He does have P waves. Case discussed with Dr. Caryl Comes. Plan is to get an event monitor, echocardiogram and refer to him in 3-4 weeks. I also  recommend aspirin 81 mg. See instructions DM: Diet control check A1c. HTN: Controlled on Lotensin, Toprol, nifedipine; labs.  Hyperlipidemia: On simvastatin. Check labs Elevated PSA: See previous entries, the patient has not changed his opinion, does not like further eval. RTC 6 months

## 2016-02-08 NOTE — Progress Notes (Signed)
Pre visit review using our clinic review tool, if applicable. No additional management support is needed unless otherwise documented below in the visit note. 

## 2016-02-08 NOTE — Assessment & Plan Note (Addendum)
TD 09-2015; Pneumonia shot-- again declined ; zostavax-- had already;  flu shot -- today Cscope  03-2006, Bx adenomatous polyp (Dr Sharlett Iles) [place] again 12-2011--- no further scopes needed   Elevated  PSA: See assessment and plan  Continue with his healthy diet, will not exercise (personal decision)  Recommend a wellness exam.

## 2016-02-09 NOTE — Assessment & Plan Note (Signed)
Arrhythmia: new issue, I suspected A Fib on physical exam, however the EKG showed tachycardia with pauses. He does have P waves. Case discussed with Dr. Caryl Comes. Plan is to get an event monitor, echocardiogram and refer to him in 3-4 weeks. I also  recommend aspirin 81 mg. See instructions DM: Diet control check A1c. HTN: Controlled on Lotensin, Toprol, nifedipine; labs.  Hyperlipidemia: On simvastatin. Check labs Elevated PSA: See previous entries, the patient has not changed his opinion, does not like further eval. RTC 6 months

## 2016-02-11 ENCOUNTER — Encounter: Payer: Self-pay | Admitting: Internal Medicine

## 2016-02-19 ENCOUNTER — Ambulatory Visit (INDEPENDENT_AMBULATORY_CARE_PROVIDER_SITE_OTHER): Payer: PPO

## 2016-02-19 DIAGNOSIS — I499 Cardiac arrhythmia, unspecified: Secondary | ICD-10-CM

## 2016-02-19 DIAGNOSIS — I4891 Unspecified atrial fibrillation: Secondary | ICD-10-CM | POA: Diagnosis not present

## 2016-02-28 ENCOUNTER — Telehealth: Payer: Self-pay | Admitting: *Deleted

## 2016-02-28 NOTE — Telephone Encounter (Signed)
[employer] called of pt as having A-flutter 2 hours ago. The rythm lasted for 2 minutes. Pt had no symptoms. I called pt at home, he states he is feeling fine. Pt  had never had any  Symptoms when he has abnormal rhythm. When [employer] called pt was at the grocery store buying groceries.

## 2016-02-29 NOTE — Telephone Encounter (Signed)
Had Dr. Irish Lack, [place], review patient's cardiac report from patient's event monitor from yesterday. Dr. Irish Lack recommended no changes at this time and to make Dr. Caryl Comes aware. Patient will be seeing Dr. Caryl Comes for an office visit on 04/07/16, will forward to Dr. Caryl Comes to see if he wants to move patient's appointment up. Patient will be coming in next week for an echo. Patient's monitor will be completed on 03/19/16. Will have rhythm scanned into patient's chart.

## 2016-03-04 ENCOUNTER — Ambulatory Visit (HOSPITAL_COMMUNITY): Payer: PPO | Attending: Internal Medicine

## 2016-03-04 ENCOUNTER — Other Ambulatory Visit: Payer: Self-pay

## 2016-03-04 DIAGNOSIS — I10 Essential (primary) hypertension: Secondary | ICD-10-CM | POA: Insufficient documentation

## 2016-03-04 DIAGNOSIS — Z87891 Personal history of nicotine dependence: Secondary | ICD-10-CM | POA: Diagnosis not present

## 2016-03-04 DIAGNOSIS — I4891 Unspecified atrial fibrillation: Secondary | ICD-10-CM | POA: Insufficient documentation

## 2016-03-04 DIAGNOSIS — E785 Hyperlipidemia, unspecified: Secondary | ICD-10-CM | POA: Insufficient documentation

## 2016-03-04 DIAGNOSIS — I071 Rheumatic tricuspid insufficiency: Secondary | ICD-10-CM | POA: Diagnosis not present

## 2016-03-04 DIAGNOSIS — Z8249 Family history of ischemic heart disease and other diseases of the circulatory system: Secondary | ICD-10-CM | POA: Diagnosis not present

## 2016-03-04 DIAGNOSIS — E119 Type 2 diabetes mellitus without complications: Secondary | ICD-10-CM | POA: Insufficient documentation

## 2016-03-04 DIAGNOSIS — I499 Cardiac arrhythmia, unspecified: Secondary | ICD-10-CM | POA: Diagnosis not present

## 2016-03-05 ENCOUNTER — Telehealth: Payer: Self-pay | Admitting: *Deleted

## 2016-03-05 NOTE — Telephone Encounter (Signed)
Received [employer] monitor report from 03/04/16 at 7:33 pm showing AFlutter w/ 5 bt Vtach, rate 188. Echo completed yesterday - unremarkable. Pt currently on Toprol 50 mg & ASA 81 mg. CHADS score 3. Scheduled to see Dr. Caryl Comes on 04/07/16. Spoke w/ pt who reports no symptoms.  States he was just finishing dinner at the time of incident. Reviewed w/ Dr. Rayann Heman, DOD - no orders received.  Will forward to Dr. Caryl Comes and his nurse for their [facility].

## 2016-03-06 ENCOUNTER — Encounter: Payer: Self-pay | Admitting: Internal Medicine

## 2016-03-11 ENCOUNTER — Telehealth: Payer: Self-pay

## 2016-03-11 NOTE — Telephone Encounter (Signed)
Received monitor report from [employer] on 03/09/16 at 10:43 am (CT) showing  Sinus Arrhythmia w Run of V-tach (5 beats)/Atrial Run/Couplet PACs/PACs, rate 205. Called patient. Patient stated he did not feel anything at the time of the event. Consulted Dr. Curt Bears, DOD, no changes at this time. Patient has a new patient appointment with Dr. Caryl Comes at the end of the month. Will forward message to Dr. Caryl Comes and his nurse to see if he wants to see patient earlier than 04/07/16.

## 2016-03-11 NOTE — Telephone Encounter (Signed)
Reviewed chart  Pt with apparent VT NS Echo EF normal  Ok for scheduled appt

## 2016-03-12 NOTE — Telephone Encounter (Signed)
appointment scheduled for 04/07/16 with Caryl Comes

## 2016-04-04 DIAGNOSIS — Z961 Presence of intraocular lens: Secondary | ICD-10-CM | POA: Diagnosis not present

## 2016-04-04 DIAGNOSIS — H25012 Cortical age-related cataract, left eye: Secondary | ICD-10-CM | POA: Diagnosis not present

## 2016-04-04 DIAGNOSIS — H401131 Primary open-angle glaucoma, bilateral, mild stage: Secondary | ICD-10-CM | POA: Diagnosis not present

## 2016-04-07 ENCOUNTER — Encounter: Payer: Self-pay | Admitting: Internal Medicine

## 2016-04-07 ENCOUNTER — Ambulatory Visit (INDEPENDENT_AMBULATORY_CARE_PROVIDER_SITE_OTHER): Payer: PPO | Admitting: Internal Medicine

## 2016-04-07 VITALS — BP 120/84 | HR 115 | Ht 67.0 in | Wt 211.2 lb

## 2016-04-07 DIAGNOSIS — I499 Cardiac arrhythmia, unspecified: Secondary | ICD-10-CM

## 2016-04-07 NOTE — Progress Notes (Addendum)
ELECTROPHYSIOLOGY CONSULT NOTE  Patient ID: Ronald [name], MRN: [id number], DOB/AGE: [birth year] 82 y.o. Admit date: (Not on file) Date of Consult: 04/07/2016  Primary Physician: Kathlene November, MD Primary Cardiologist: new Consulting Physician [place]  Chief Complaint: abnormal  ECG   HPI Ronald [name] is a 82 y.o. male referred for an abnormal ECG obtained during routine physical  He had seen Dr. Larose Kells ECG demonstrated recurrent runs of nonsustained atrial tachycardia 12/17 echocardiogram normal  Interestingly, he has no symptoms. He has no palpitations shortness of breath exercise intolerance peripheral edema or chest pain or dizziness.  He's had no transient neurological events.  Caffeine use is limited  Past Medical History:  Diagnosis Date  . BPH (benign prostatic hypertrophy)    f/u @ Biopsy, s/p bx 2009 apro (-)  . Calculus of kidney    s/p R percutaneus nephrostomy @ [facility]  . Cataract   . Colon polyp   . Complication of anesthesia    violent upon waking up  . Diabetes mellitus    pre diabetic  . Diverticulosis   . Edema leg    asymetric , L > R , [place] neg for DVT 09-2010  . GERD (gastroesophageal reflux disease)   . Hemorrhoids   . Hypercalcemia    parathyroidectomy 04/2008  . Hyperlipidemia   . Hypertension   . Iron deficiency anemia       Surgical History:  Past Surgical History:  Procedure Laterality Date  . APPENDECTOMY    . CATARACT EXTRACTION Right   . COLONOSCOPY  12/16/2011   Procedure: COLONOSCOPY;  Surgeon: Inda Castle, MD;  [facility];  Service: Endoscopy;  Laterality: N/A;  . ESOPHAGOGASTRODUODENOSCOPY N/A 01/17/2013   Procedure: ESOPHAGOGASTRODUODENOSCOPY (EGD);  Surgeon: Inda Castle, MD;  Location: [facility];  Service: Endoscopy;  Laterality: N/A;  . EXTERNAL EAR SURGERY  01/2016   BCC removal  . EYE SURGERY  infant   "lazy eye"  . FLEXIBLE SIGMOIDOSCOPY  02/09/2012   Procedure: FLEXIBLE SIGMOIDOSCOPY;  Surgeon:  Inda Castle, MD;  Location: [facility];  Service: Endoscopy;  Laterality: N/A;  . HEMORRHOID BANDING  02/09/2012   Procedure: HEMORRHOID BANDING;  Surgeon: Inda Castle, MD;  [facility]: [facility];  Service: Endoscopy;  Laterality: N/A;  . HOT HEMOSTASIS N/A 01/17/2013   Procedure: HOT HEMOSTASIS (ARGON PLASMA COAGULATION/BICAP);  Surgeon: Inda Castle, MD;  Location: [facility];  Service: Endoscopy;  Laterality: N/A;  . KIDNEY STONE SURGERY    . PARATHYROIDECTOMY  04/2008  . TONSILLECTOMY       Home Meds: Prior to Admission medications   Medication Sig Start Date End Date Taking? Authorizing Provider  aspirin EC 81 MG tablet Take 81 mg by mouth daily.   Yes Historical Provider, MD  benazepril (LOTENSIN) 20 MG tablet Take 1 tablet (20 mg total) by mouth daily. 11/26/15  Yes Colon Branch, MD  cholecalciferol (VITAMIN D) 1000 UNITS tablet Take 1,000 Units by mouth daily.   Yes Historical Provider, MD  esomeprazole (NEXIUM) 20 MG capsule Take 1 capsule (20 mg total) by mouth daily at 12 noon. 04/10/14  Yes Colon Branch, MD  metoprolol succinate (TOPROL-XL) 50 MG 24 hr tablet Take 1 tablet (50 mg total) by mouth daily. 10/01/15  Yes Colon Branch, MD  NIFEdipine (NIFEDICAL XL) 60 MG 24 hr tablet Take 1 tablet (60 mg total) by mouth daily. 09/17/15  Yes Colon Branch, MD  omeprazole (Ogema) 20  MG capsule Take 1 capsule (20 mg total) by mouth daily. 09/17/15  Yes Colon Branch, MD  saw palmetto 160 MG capsule Take 540 mg by mouth daily.    Yes Historical Provider, MD  simvastatin (ZOCOR) 40 MG tablet Take 1 tablet (40 mg total) by mouth at bedtime. 11/26/15  Yes Colon Branch, MD    Allergies:  Allergies  Allergen Reactions  . Cucumber Extract Nausea And Vomiting  . Sulfonamide Derivatives Other (See Comments)    "don't know reaction" per pt    Social History   Social History  . Marital status: Widowed    Spouse name: N/A  . Number of children: 3  . Years of education: N/A   Occupational  History  . retired     Social History Main Topics  . Smoking status: Former Smoker    Years: 25.00    Types: Cigarettes    Quit date: 11/30/1981  . Smokeless tobacco: Never Used  . Alcohol use 4.2 oz/week    7 Glasses of wine per week     Comment: 1 glass of wine or beer with dinner  . Drug use: No  . Sexual activity: Not on file   Other Topics Concern  . Not on file   Social History Narrative   Retired, single, has  a girlfriend , 3 children  (one lives in town)   Daughter has a copy of his living will     Family History  Problem Relation Age of Onset  . Heart attack Father 63  . Heart attack Mother   . Colon cancer Neg Hx   . Prostate cancer Neg Hx   . Diabetes Neg Hx      ROS:  Please see the history of present illness.     All other systems reviewed and negative.    Physical Exam: Blood pressure 120/84, pulse (!) 115, height 5\' 7"  (1.702 m), weight 211 lb 3.2 oz (95.8 kg), SpO2 98 %. General: Well developed, well nourished male in no acute distress. Head: Normocephalic, atraumatic, sclera non-icteric, no xanthomas, nares are without discharge. EENT: normal  Lymph Nodes:  none Neck: Negative for carotid bruits. JVD not elevated. Back:without scoliosis kyphosis Lungs: Clear bilaterally to auscultation without wheezes, rales, or rhonchi. Breathing is unlabored. Heart: Irregular rate  with S1 S2. 2/6 systoli murmur . No rubs, or gallops appreciated. Abdomen: Soft, non-tender, non-distended with normoactive bowel sounds. No hepatomegaly. No rebound/guarding. No obvious abdominal masses. Msk:  Strength and tone appear normal for age. Extremities: No clubbing or cyanosis  edema.  Distal pedal pulses are 2+ and equal bilaterally. Skin: Warm and Dry Neuro: Alert and oriented X 3. CN III-XII intact Grossly normal sensory and motor function . Psych:  Responds to questions appropriately with a normal affect.      Labs: Cardiac Enzymes No results for input(s): CKTOTAL,  CKMB, TROPONINI in the last 72 hours. CBC Lab Results  Component Value Date   WBC 8.9 02/08/2016   HGB 17.8 (H) 02/08/2016   HCT 53.1 (H) 02/08/2016   MCV 89.3 02/08/2016   PLT 219.0 02/08/2016   PROTIME: No results for input(s): LABPROT, INR in the last 72 hours. Chemistry No results for input(s): NA, K, CL, CO2, BUN, CREATININE, CALCIUM, PROT, BILITOT, ALKPHOS, ALT, AST, GLUCOSE in the last 168 hours.  Invalid input(s): LABALBU Lipids Lab Results  Component Value Date   CHOL 146 02/08/2016   HDL 45.70 02/08/2016   LDLCALC 80 02/08/2016  TRIG 102.0 02/08/2016   BNP No results found for: PROBNP Thyroid Function Tests: No results for input(s): TSH, T4TOTAL, T3FREE, THYROIDAB in the last 72 hours.  Invalid input(s): FREET3 Miscellaneous No results found for: DDIMER  Radiology/Studies:  No results found.  EKG: SR with freq almost incessant nonsustained atrial tachy    Assessment and Plan:  Atrial Tach/fllutter nonsustained  His atrial tachycardia/flutter is so incessant, I worry about its impact on atrial appendage emptying function. I've talked with Dr. London Sheer, unfortunately we cannot gather this date without transesophageal echo.  I have discussed with Dr. Greggory Brandy and I don't think we have A monitoring tool which is sufficiently specific to help identify the presence of atrial fibrillation. I have advised that he get a pulse oximeter and we reviewed what looks like when his atrial fibrillation. The goal would then be to notice if the pauses disappeared which would likely reflects the development of atrial fibrillation as the sinus beats associated with the pauses were then no longer evident.  There is no good data to guide [place], however, given the incessant nature of his tachycardia and a few beats of his own that are present, I would favor anticoagulation      [name]

## 2016-04-07 NOTE — Patient Instructions (Signed)
Medication Instructions: - Your physician has recommended you make the following change in your medication:  1) Stop aspirin  Labwork: - none ordered  Procedures/Testing: - none ordered  Follow-Up: - Your physician recommends that you schedule a follow-up appointment in: 3-4 months with Dr. Caryl Comes.   Any Additional Special Instructions Will Be Listed Below (If Applicable).     If you need a refill on your cardiac medications before your next appointment, please call your pharmacy.

## 2016-04-11 ENCOUNTER — Encounter: Payer: Self-pay | Admitting: Internal Medicine

## 2016-04-17 ENCOUNTER — Telehealth: Payer: Self-pay | Admitting: Internal Medicine

## 2016-04-17 NOTE — Telephone Encounter (Signed)
Deboraha Sprang, MD  Emily Filbert, RN        Marlyn Rabine, I think, given the extreme burden of atrial tachycardia that we have to presume that his atrium does not work very well and as such, his left atrial appendage probably does not work very well and that he would be at some increased risk for stroke as a consequence. I think a NOAC is a right choice although we don't have certainty in this regard.

## 2016-04-17 NOTE — Telephone Encounter (Signed)
New Message  Pt voiced he sent a message through mychart and has not heard a response.  Please f/u with pt

## 2016-04-17 NOTE — Telephone Encounter (Signed)
Responded to patient's MyChart message regarding his blood thinner and asked that he price Eliquis/ Pradaxa/ Xarelto and let me know which he prefers to take.

## 2016-04-22 ENCOUNTER — Encounter: Payer: Self-pay | Admitting: Internal Medicine

## 2016-04-22 ENCOUNTER — Telehealth: Payer: Self-pay | Admitting: Internal Medicine

## 2016-04-22 DIAGNOSIS — I480 Paroxysmal atrial fibrillation: Secondary | ICD-10-CM

## 2016-04-22 NOTE — Telephone Encounter (Signed)
Reviewed Eliquis dosing with Dr. Caryl Comes- patient is 82 years old (he will turn 76 in June), last creatinine 02/08/16 - 1.42, 95 kgs.  Per Dr. Caryl Comes, since the patient is borderline for his dosing of Elqius, it is best to have him come in for a repeat BMP/ CBC to confirm current renal function.  I have called and spoken with the patient and he is agreeable for coming for lab work- He will come tomorrow- I advised him I will call him back Thursday to confirm his dosing.

## 2016-04-22 NOTE — Telephone Encounter (Signed)
Latricia Heft [place]  to Deboraha Sprang, MD       10:33 PM  Thank you for your reply. I would appreciate samples of Eliquis so that I can make sure I am able to tolerate the medication. Please advise me when I am able to pick them up.   Me  to Latricia Heft [place]       3:57 PM  Mr. Susy Frizzle,   I apologize for the delay in getting back with you. I did hear back from Dr. Caryl Comes today. He did feel that with all things taken into consideration with your heart rhythm, that it is in your best interest to place you on a blood thinner to decrease your risk of stroke.  There are 3 different blood thinners that we use. You may check with your insurance/ pharmacy to see which may be the most affordable for you as they are all brand name (no generics available yet).   1) Eliquis- one tablet twice daily  2) Pradaxa- one tablet twice daily  3) Xarelto- one tablet once daily   If you want to check and see which one may be the most cost efficient for you and let me know, then we can get a RX sent in for you. We will also check our availability of samples to get you started.  Dr. Caryl Comes typically prefers Eliquis, but each doctor has their own individual preference for blood thinners, so this is not saying that the other 2 are bad/ will not work for you.   Just let me know which you would prefer to try and will get them this process started for you.   Thank you!  Alvis Lemmings, RN, BSN

## 2016-04-23 ENCOUNTER — Other Ambulatory Visit: Payer: PPO | Admitting: *Deleted

## 2016-04-23 DIAGNOSIS — I480 Paroxysmal atrial fibrillation: Secondary | ICD-10-CM

## 2016-04-23 LAB — CBC WITH DIFFERENTIAL/PLATELET
BASOS: 0 %
Basophils Absolute: 0 10*3/uL (ref 0.0–0.2)
EOS (ABSOLUTE): 0.1 10*3/uL (ref 0.0–0.4)
EOS: 2 %
Hematocrit: 50.2 % (ref 37.5–51.0)
Hemoglobin: 17.3 g/dL (ref 13.0–17.7)
IMMATURE GRANS (ABS): 0 10*3/uL (ref 0.0–0.1)
IMMATURE GRANULOCYTES: 0 %
LYMPHS: 26 %
Lymphocytes Absolute: 1.9 10*3/uL (ref 0.7–3.1)
MCH: 29.7 pg (ref 26.6–33.0)
MCHC: 34.5 g/dL (ref 31.5–35.7)
MCV: 86 fL (ref 79–97)
MONOS ABS: 0.5 10*3/uL (ref 0.1–0.9)
Monocytes: 7 %
NEUTROS PCT: 65 %
Neutrophils Absolute: 4.7 10*3/uL (ref 1.4–7.0)
PLATELETS: 199 10*3/uL (ref 150–379)
RBC: 5.83 x10E6/uL — AB (ref 4.14–5.80)
RDW: 13.9 % (ref 12.3–15.4)
WBC: 7.3 10*3/uL (ref 3.4–10.8)

## 2016-04-23 LAB — BASIC METABOLIC PANEL
BUN/Creatinine Ratio: 16 (ref 10–24)
BUN: 19 mg/dL (ref 8–27)
CALCIUM: 9.2 mg/dL (ref 8.6–10.2)
CO2: 23 mmol/L (ref 18–29)
CREATININE: 1.21 mg/dL (ref 0.76–1.27)
Chloride: 99 mmol/L (ref 96–106)
GFR calc Af Amer: 65 mL/min/{1.73_m2} (ref >59)
GFR, EST NON AFRICAN AMERICAN: 57 mL/min/{1.73_m2} — AB (ref >59)
GLUCOSE: 105 mg/dL — AB (ref 65–99)
POTASSIUM: 4.6 mmol/L (ref 3.5–5.2)
Sodium: 141 mmol/L (ref 134–144)

## 2016-04-24 ENCOUNTER — Telehealth: Payer: Self-pay | Admitting: *Deleted

## 2016-04-24 MED ORDER — APIXABAN 5 MG PO TABS: 5.0000 mg | ORAL_TABLET | Freq: Two times a day (BID) | ORAL | 11 refills | Status: DC

## 2016-04-24 NOTE — Telephone Encounter (Signed)
Renee,  Can you please print a RX for Eliquis 5 mg BID # 60 w/ 11 refills for Dr. Caryl Comes to sign and just put it on my cart.  Thanks!

## 2016-04-24 NOTE — Telephone Encounter (Signed)
Labs results reviewed with Dr. Caryl Comes- orders received to start the patient on Eliquis 5 mg one tablet BID. Staff message sent to the refill team to please check availability of samples for this patient.

## 2016-04-24 NOTE — Telephone Encounter (Signed)
I called and spoke with the patient. He is aware that Dr. Caryl Comes wants to start him on Eliquis 5 mg BID based on his most resent lab tests. He will come by the office tomorrow to pick up samples.

## 2016-04-24 NOTE — Telephone Encounter (Signed)
Eliquis samples placed at the front desk for patient. 

## 2016-04-25 ENCOUNTER — Encounter: Payer: Self-pay | Admitting: Internal Medicine

## 2016-04-29 ENCOUNTER — Other Ambulatory Visit: Payer: Self-pay | Admitting: *Deleted

## 2016-04-29 MED ORDER — APIXABAN 5 MG PO TABS
5.0000 mg | ORAL_TABLET | Freq: Two times a day (BID) | ORAL | 0 refills | Status: DC
Start: 2016-04-29 — End: 2016-04-29

## 2016-04-29 MED ORDER — APIXABAN 5 MG PO TABS: 5.0000 mg | ORAL_TABLET | Freq: Two times a day (BID) | ORAL | 3 refills | Status: DC

## 2016-05-04 ENCOUNTER — Telehealth: Payer: Self-pay | Admitting: Cardiology

## 2016-05-04 NOTE — Telephone Encounter (Signed)
Pt called with small spot of blood in mucus.  He does have a cold and mucus coming from lungs.  Reassured, may be just irritation.  If blood increases to go to Urgent care or ED.  If any big clots stop Eliquis and go to ER.

## 2016-05-12 ENCOUNTER — Encounter: Payer: Self-pay | Admitting: Family Medicine

## 2016-05-12 ENCOUNTER — Ambulatory Visit (INDEPENDENT_AMBULATORY_CARE_PROVIDER_SITE_OTHER): Payer: PPO | Admitting: Family Medicine

## 2016-05-12 VITALS — BP 112/50 | HR 72 | Temp 97.8°F | Ht 67.0 in | Wt 209.6 lb

## 2016-05-12 DIAGNOSIS — B9689 Other specified bacterial agents as the cause of diseases classified elsewhere: Secondary | ICD-10-CM | POA: Diagnosis not present

## 2016-05-12 DIAGNOSIS — J208 Acute bronchitis due to other specified organisms: Secondary | ICD-10-CM | POA: Diagnosis not present

## 2016-05-12 MED ORDER — BENZONATATE 100 MG PO CAPS: 100.0000 mg | ORAL_CAPSULE | Freq: Three times a day (TID) | ORAL | 0 refills | Status: DC | PRN

## 2016-05-12 MED ORDER — AZITHROMYCIN 250 MG PO TABS: ORAL_TABLET | ORAL | 0 refills | Status: DC

## 2016-05-12 NOTE — Patient Instructions (Signed)
Continue to push fluids, practice good hand hygiene, and cover your mouth if you cough.  If you start having fevers, shaking or shortness of breath, seek immediate care.  Wait 3 days before taking antibiotic (azithromycin). If you are no better, take it.

## 2016-05-12 NOTE — Progress Notes (Signed)
Pre visit review using our clinic review tool, if applicable. No additional management support is needed unless otherwise documented below in the visit note. 

## 2016-05-12 NOTE — Progress Notes (Signed)
Chief Complaint  Patient presents with  . Cough    product-white-x 2 weeks    Latricia Heft Newson here for URI complaints.  Duration: 2 weeks  Associated symptoms: cough, stuffy nose Denies: shortness of breath, myalgia and fevers/shaking Treatment to date: Mucinex Sick contacts: Yes - daughter  ROS:  Const: Denies fevers HEENT: As noted in HPI Lungs: No SOB  Past Medical History:  Diagnosis Date  . BPH (benign prostatic hypertrophy)    f/u @ Biopsy, s/p bx 2009 apro (-)  . Calculus of kidney    s/p R percutaneus nephrostomy @ [facility]  . Cataract   . Colon polyp   . Complication of anesthesia    violent upon waking up  . Diabetes mellitus    pre diabetic  . Diverticulosis   . Edema leg    asymetric , L > R , [place] neg for DVT 09-2010  . GERD (gastroesophageal reflux disease)   . Hemorrhoids   . Hypercalcemia    parathyroidectomy 04/2008  . Hyperlipidemia   . Hypertension   . Iron deficiency anemia    Family History  Problem Relation Age of Onset  . Heart attack Father 34  . Heart attack Mother   . Colon cancer Neg Hx   . Prostate cancer Neg Hx   . Diabetes Neg Hx     BP (!) 112/50 (BP Location: Left Arm, Patient Position: Sitting, Cuff Size: Normal)   Pulse 72   Temp 97.8 F (36.6 C) (Oral)   Ht 5\' 7"  (1.702 m)   Wt 209 lb 9.6 oz (95.1 kg)   SpO2 96%   BMI 32.83 kg/m  General: Awake, alert, appears stated age HEENT: AT, [place], ears patent b/l and TM's neg, nares patent w/o discharge, no sinus TTP, pharynx pink and without exudates, MMM Neck: No masses or asymmetry Heart: RRR, no murmurs, no bruits Lungs: CTAB, no accessory muscle use Psych: Age appropriate judgment and insight, normal mood and affect  Acute bacterial bronchitis - Plan: benzonatate (TESSALON PERLES) 100 MG capsule, azithromycin (ZITHROMAX) 250 MG tablet  Orders as above. Continue to push fluids, practice good hand hygiene, cover mouth when coughing. Wait 3 more days, if not improving, take  abx.  F/u prn. If starting to experience fevers, shaking, or shortness of breath, seek immediate care. Pt voiced understanding and agreement to the plan.  [place], DO 05/12/16 12:30 PM

## 2016-05-23 ENCOUNTER — Encounter: Payer: Self-pay | Admitting: Internal Medicine

## 2016-05-23 MED ORDER — BENAZEPRIL HCL 20 MG PO TABS: 20.0000 mg | ORAL_TABLET | Freq: Every day | ORAL | 2 refills | Status: DC

## 2016-05-23 MED ORDER — SIMVASTATIN 40 MG PO TABS
40.0000 mg | ORAL_TABLET | Freq: Every day | ORAL | 2 refills | Status: DC
Start: 2016-05-23 — End: 2017-02-23

## 2016-06-13 ENCOUNTER — Other Ambulatory Visit: Payer: Self-pay | Admitting: Internal Medicine

## 2016-06-13 DIAGNOSIS — I10 Essential (primary) hypertension: Secondary | ICD-10-CM

## 2016-06-14 ENCOUNTER — Encounter: Payer: Self-pay | Admitting: Internal Medicine

## 2016-06-18 ENCOUNTER — Encounter: Payer: Self-pay | Admitting: Internal Medicine

## 2016-07-03 ENCOUNTER — Other Ambulatory Visit: Payer: Self-pay | Admitting: Internal Medicine

## 2016-07-23 ENCOUNTER — Ambulatory Visit: Payer: PPO | Admitting: Internal Medicine

## 2016-08-02 ENCOUNTER — Emergency Department (HOSPITAL_BASED_OUTPATIENT_CLINIC_OR_DEPARTMENT_OTHER)
Admission: EM | Admit: 2016-08-02 | Discharge: 2016-08-02 | Disposition: A | Discharge status: Home / Self Care | Payer: PPO | Attending: Emergency Medicine | Admitting: Emergency Medicine

## 2016-08-02 ENCOUNTER — Telehealth: Payer: Self-pay | Admitting: Cardiology

## 2016-08-02 ENCOUNTER — Encounter (HOSPITAL_BASED_OUTPATIENT_CLINIC_OR_DEPARTMENT_OTHER): Payer: Self-pay | Admitting: Emergency Medicine

## 2016-08-02 DIAGNOSIS — I10 Essential (primary) hypertension: Secondary | ICD-10-CM | POA: Insufficient documentation

## 2016-08-02 DIAGNOSIS — Z87891 Personal history of nicotine dependence: Secondary | ICD-10-CM | POA: Insufficient documentation

## 2016-08-02 DIAGNOSIS — Z7901 Long term (current) use of anticoagulants: Secondary | ICD-10-CM | POA: Insufficient documentation

## 2016-08-02 DIAGNOSIS — R7303 Prediabetes: Secondary | ICD-10-CM | POA: Insufficient documentation

## 2016-08-02 DIAGNOSIS — R040 Epistaxis: Secondary | ICD-10-CM | POA: Diagnosis not present

## 2016-08-02 MED ORDER — OXYMETAZOLINE HCL 0.05 % NA SOLN
1.0000 | Freq: Once | NASAL | Status: AC
  Administered 2016-08-02: 1 via NASAL
  Filled 2016-08-02: qty 15

## 2016-08-02 NOTE — ED Notes (Signed)
Nose bleeding started 4 hours ago.  Pt is on Eliquis.  Patient placed a tissue to his right nostril.  Tissue replaced with dry gauze and noted a big blood clot came out with the tissue.  Denies pain.

## 2016-08-02 NOTE — Telephone Encounter (Signed)
Pt called due to nose bleed from Rt nare.    Continuous flow, he has used pressure and packed,  He is now on Eliquis.    With continued flow I asked him to go to urgent care to be seen.  It may need cauterization.  He is agreeable.  He has been on Eliquis for 2 months.

## 2016-08-02 NOTE — Discharge Instructions (Signed)
Use the nasal spray 2 times a day and place vasoline to the inside of nose.  Also can use a humidifier to prevent drying of the nose.  Avoid excessive nose blowing.

## 2016-08-02 NOTE — ED Notes (Signed)
Gauzed to right nostril was removed by MD.  No active bleeding noted at this time.

## 2016-08-02 NOTE — ED Triage Notes (Addendum)
Nose bleed from R nare x 4 hours. Pt takes eliquis.

## 2016-08-02 NOTE — ED Provider Notes (Signed)
[facility] DEPT [facility] Provider Note   CSN: [id number] Arrival date & time: 08/02/16  1524 By signing my name below, I, Ronald [name], attest that this documentation has been prepared under the direction and in the presence of [name], MD . Electronically Signed: Fabian [name], ED Scribe. 08/02/2016. 3:40 PM.  History   Chief Complaint Chief Complaint  Patient presents with  . Epistaxis  HPI Comments:  Ronald [name] is a 82 y.o. male with a history of HTN who presents to the Emergency Department complaining of sudden onset, persistent epistaxis from right lateral nare onset four hours ago. Pt applied pressure and inserted a tissue in his nose PTA with no relief of bleeding. When pt arrived to the ED and removed the tissue, a clot came out of his nare. Pt denies SOB, congestion, and postnasal drainage.  The history is provided by the patient. No language interpreter was used.   Past Medical History:  Diagnosis Date  . BPH (benign prostatic hypertrophy)    f/u @ Biopsy, s/p bx 2009 apro (-)  . Calculus of kidney    s/p R percutaneus nephrostomy @ [facility]  . Cataract   . Colon polyp   . Complication of anesthesia    violent upon waking up  . Diabetes mellitus    pre diabetic  . Diverticulosis   . Edema leg    asymetric , L > R , [place] neg for DVT 09-2010  . GERD (gastroesophageal reflux disease)   . Hemorrhoids   . Hypercalcemia    parathyroidectomy 04/2008  . Hyperlipidemia   . Hypertension   . Iron deficiency anemia     Patient Active Problem List   Diagnosis Date Noted  . PCP NOTES >>>>>>>>>>>>> 12/02/2014  . Iron deficiency anemia, unspecified 01/17/2013  . 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 Blood in stool 01/17/2013  . Right calf pain 11/30/2012  . Personal history of colonic polyps 12/01/2011  . Internal hemorrhoids without mention of complication 82/50/0370  . Annual physical exam 09/23/2010  . Lower extremity edema 09/23/2010  . Elevated PSA  04/12/2008  . ERECTILE DYSFUNCTION 05/25/2007  . DMII (diabetes mellitus, type 2) ([place]) 03/02/2007  . CALCULUS OF KIDNEY 02/15/2007  . HYPERPARATHYROIDISM UNSPECIFIED 11/16/2006  . HYPERLIPIDEMIA 11/11/2006  . Essential hypertension 11/11/2006  . GERD 11/11/2006    Past Surgical History:  Procedure Laterality Date  . APPENDECTOMY    . CATARACT EXTRACTION Right   . COLONOSCOPY  12/16/2011   Procedure: COLONOSCOPY;  Surgeon: Inda Castle, MD;  Location: [facility];  Service: Endoscopy;  Laterality: N/A;  . ESOPHAGOGASTRODUODENOSCOPY N/A 01/17/2013   Procedure: ESOPHAGOGASTRODUODENOSCOPY (EGD);  Surgeon: Inda Castle, MD;  Location: [facility];  Service: Endoscopy;  Laterality: N/A;  . EXTERNAL EAR SURGERY  01/2016   BCC removal  . EYE SURGERY  infant   "lazy eye"  . FLEXIBLE SIGMOIDOSCOPY  02/09/2012   Procedure: FLEXIBLE SIGMOIDOSCOPY;  Surgeon: Inda Castle, MD;  [facility]: [facility];  Service: Endoscopy;  Laterality: N/A;  . HEMORRHOID BANDING  02/09/2012   Procedure: HEMORRHOID BANDING;  Surgeon: Inda Castle, MD;  [facility];  Service: Endoscopy;  Laterality: N/A;  . HOT HEMOSTASIS N/A 01/17/2013   Procedure: HOT HEMOSTASIS (ARGON PLASMA COAGULATION/BICAP);  Surgeon: Inda Castle, MD;  Location: [facility];  Service: Endoscopy;  Laterality: N/A;  . KIDNEY STONE SURGERY    . PARATHYROIDECTOMY  04/2008  . TONSILLECTOMY      Home Medications  Prior to Admission medications   Medication Sig Start Date End Date Taking? Authorizing Provider  apixaban (ELIQUIS) 5 MG TABS tablet Take 1 tablet (5 mg total) by mouth 2 (two) times daily. 04/29/16  Yes Deboraha Sprang, MD  benazepril (LOTENSIN) 20 MG tablet Take 1 tablet (20 mg total) by mouth daily. 05/23/16  Yes Paz, Alda Berthold, MD  esomeprazole (NEXIUM) 20 MG capsule Take 1 capsule (20 mg total) by mouth daily at 12 noon. 04/10/14  Yes Paz, Alda Berthold, MD  metoprolol succinate (TOPROL-XL) 50 MG 24 hr tablet Take  1 tablet (50 mg total) by mouth daily. 07/03/16  Yes Paz, Alda Berthold, MD  NIFEdipine (PROCARDIA XL/ADALAT-CC) 60 MG 24 hr tablet Take 1 tablet (60 mg total) by mouth daily. 06/13/16  Yes Paz, Alda Berthold, MD  omeprazole (PRILOSEC) 20 MG capsule Take 1 capsule (20 mg total) by mouth daily. 09/17/15  Yes Paz, Alda Berthold, MD  simvastatin (ZOCOR) 40 MG tablet Take 1 tablet (40 mg total) by mouth at bedtime. 05/23/16  Yes Paz, Alda Berthold, MD  azithromycin (ZITHROMAX) 250 MG tablet Take 2 tabs the first day and then 1 tab daily until you run out. 05/15/16   Wendling, Crosby Oyster, DO  benzonatate (TESSALON PERLES) 100 MG capsule Take 1 capsule (100 mg total) by mouth 3 (three) times daily as needed for cough. 05/12/16   Shelda Pal, DO  cholecalciferol (VITAMIN D) 1000 UNITS tablet Take 1,000 Units by mouth daily.    [provider]  saw palmetto 160 MG capsule Take 540 mg by mouth daily.     [provider]    Family History Family History  Problem Relation Age of Onset  . Heart attack Father 60  . Heart attack Mother   . Colon cancer Neg Hx   . Prostate cancer Neg Hx   . Diabetes Neg Hx     Social History Social History  Substance Use Topics  . Smoking status: Former Smoker    Years: 25.00    Types: Cigarettes    Quit date: 11/30/1981  . Smokeless tobacco: Never Used  . Alcohol use 4.2 oz/week    7 Glasses of wine per week     Comment: 1 glass of wine or beer with dinner    Allergies   Cucumber extract and Sulfonamide derivatives   Review of Systems Review of Systems All systems reviewed and are negative for acute change except as noted in the HPI.  Physical Exam Updated Vital Signs BP (!) 153/111 (BP Location: Left Arm)   Pulse 95   Temp 97.8 F (36.6 C) (Oral)   Resp 19   Ht 5\' 7"  (1.702 m)   Wt 208 lb (94.3 kg)   SpO2 100%   BMI 32.58 kg/m   Physical Exam  Constitutional: He is oriented to person, place, and time. He appears well-developed and well-nourished.  No distress.  HENT:  Head: Normocephalic and atraumatic.  No blood in posterior oropharynx. Fresh clot In right nare on septum without active bleeding. Left nare normal.  Eyes: Conjunctivae are normal.  Cardiovascular: Normal rate.   Pulmonary/Chest: Effort normal.  Abdominal: He exhibits no distension.  Musculoskeletal: He exhibits edema.  Pitting edema to BLE  Neurological: He is alert and oriented to person, place, and time.  Skin: Skin is warm and dry.  Psychiatric: He has a normal mood and affect.  Nursing note and vitals reviewed.  ED Treatments / Results  DIAGNOSTIC STUDIES:  Oxygen  Saturation is 100% on RA, normal by my interpretation.    COORDINATION OF CARE:  3:41 PM Discussed treatment plan to apply fluid constrict vessels and Vaseline to the clot with pt at bedside and pt agreed to plan.  Labs (all labs ordered are listed, but only abnormal results are displayed) Labs Reviewed - No data to display  EKG  EKG Interpretation None       Radiology No results found.  Procedures Procedures (including critical care time)  Medications Ordered in ED Medications  oxymetazoline (AFRIN) 0.05 % nasal spray 1 spray (not administered)     Initial Impression / Assessment and Plan / ED Course  I have reviewed the triage vital signs and the nursing notes.  Pertinent labs & imaging results that were available during my care of the patient were reviewed by me and considered in my medical decision making (see chart for details).    Patient is an elderly gentleman currently anticoagulated presenting today with a nosebleed from the right [place] Adam. Patient denies any trauma, recent nose picking or congestion.  His been complaining for 4 hours and was not stopping. He denies any chest pain, shortness of breath or lightheadedness. On exam he is currently hemostatic with the clot present in the right nare on the septum. Blood pressure is elevated today at 153/111. We'll recheck the  patient has taken his blood pressure medications. 4:20 PM After an hour patient has not had any further bleeding. Will give oxymetolazone to use twice a day as well as getting a humidifier and using Vaseline on the inside of the nose. Cautioned patient that bleeding could restart and recommended applying pressure for 5-10 minutes but encourage patient to return if bleeding worsens.  5:05 PM Epistaxis has remained hemostatic. Final Clinical Impressions(s) / ED Diagnoses   Final diagnoses:  Acute anterior epistaxis    New Prescriptions New Prescriptions   No medications on file   I personally performed the services described in this documentation, which was scribed in my presence.  The recorded information has been reviewed and considered.    [name], MD 08/02/16 [phone]

## 2016-08-08 ENCOUNTER — Encounter: Payer: Self-pay | Admitting: Internal Medicine

## 2016-08-08 ENCOUNTER — Ambulatory Visit (INDEPENDENT_AMBULATORY_CARE_PROVIDER_SITE_OTHER): Payer: PPO | Admitting: Internal Medicine

## 2016-08-08 VITALS — BP 128/68 | HR 63 | Temp 98.2°F | Resp 14 | Ht 67.0 in | Wt 211.0 lb

## 2016-08-08 DIAGNOSIS — I10 Essential (primary) hypertension: Secondary | ICD-10-CM | POA: Diagnosis not present

## 2016-08-08 DIAGNOSIS — E118 Type 2 diabetes mellitus with unspecified complications: Secondary | ICD-10-CM

## 2016-08-08 DIAGNOSIS — I483 Typical atrial flutter: Secondary | ICD-10-CM

## 2016-08-08 LAB — BASIC METABOLIC PANEL
BUN: 21 mg/dL (ref 6–23)
CALCIUM: 9.1 mg/dL (ref 8.4–10.5)
CO2: 27 mEq/L (ref 19–32)
Chloride: 104 mEq/L (ref 96–112)
Creatinine, Ser: 1.21 mg/dL (ref 0.40–1.50)
GFR: 61.33 mL/min (ref >60.00)
GLUCOSE: 102 mg/dL — AB (ref 70–99)
POTASSIUM: 3.9 meq/L (ref 3.5–5.1)
SODIUM: 139 meq/L (ref 135–145)

## 2016-08-08 NOTE — Progress Notes (Signed)
Pre visit review using our clinic review tool, if applicable. No additional management support is needed unless otherwise documented below in the visit note. 

## 2016-08-08 NOTE — Progress Notes (Signed)
Subjective:    Patient ID: Ronald [name], male    DOB: 1934/03/18, 82 y.o.   MRN: [id number]  DOS:  08/08/2016 Type of visit - description : rov Interval history: Since the last visit, saw cardiology, diagnosed with atrial tachy/flutter. Note reviewed. HTN: Good compliance of medication, ambulatory BPs usually 140, 145. Labs reviewed, due for a BMP Medications reviewed, good compliance without apparent side effect  Review of Systems Had a nosebleed 1, went to the ER, no further episodes Denies chest pain, palpitations, difficulty breathing. Edema at baseline  Past Medical History:  Diagnosis Date  . BPH (benign prostatic hypertrophy)    f/u @ Biopsy, s/p bx 2009 apro (-)  . Calculus of kidney    s/p R percutaneus nephrostomy @ [facility]  . Cataract   . Colon polyp   . Complication of anesthesia    violent upon waking up  . Diabetes mellitus    pre diabetic  . Diverticulosis   . Edema leg    asymetric , L > R , [place] neg for DVT 09-2010  . GERD (gastroesophageal reflux disease)   . Hemorrhoids   . Hypercalcemia    parathyroidectomy 04/2008  . Hyperlipidemia   . Hypertension   . Iron deficiency anemia     Past Surgical History:  Procedure Laterality Date  . APPENDECTOMY    . CATARACT EXTRACTION Right   . COLONOSCOPY  12/16/2011   Procedure: COLONOSCOPY;  Surgeon: Inda Castle, MD;  [facility]: [facility];  Service: Endoscopy;  Laterality: N/A;  . ESOPHAGOGASTRODUODENOSCOPY N/A 01/17/2013   Procedure: ESOPHAGOGASTRODUODENOSCOPY (EGD);  Surgeon: Inda Castle, MD;  Location: [facility];  Service: Endoscopy;  Laterality: N/A;  . EXTERNAL EAR SURGERY  01/2016   BCC removal  . EYE SURGERY  infant   "lazy eye"  . FLEXIBLE SIGMOIDOSCOPY  02/09/2012   Procedure: FLEXIBLE SIGMOIDOSCOPY;  Surgeon: Inda Castle, MD;  Location[facility];  Service: Endoscopy;  Laterality: N/A;  . HEMORRHOID BANDING  02/09/2012   Procedure: HEMORRHOID BANDING;  Surgeon: Inda Castle, MD;  [facility];  Service: Endoscopy;  Laterality: N/A;  . HOT HEMOSTASIS N/A 01/17/2013   Procedure: HOT HEMOSTASIS (ARGON PLASMA COAGULATION/BICAP);  Surgeon: Inda Castle, MD;  Location: [facility];  Service: Endoscopy;  Laterality: N/A;  . KIDNEY STONE SURGERY    . PARATHYROIDECTOMY  04/2008  . TONSILLECTOMY      Social History   Social History  . Marital status: Widowed    Spouse name: N/A  . Number of children: 3  . Years of education: N/A   Occupational History  . retired     Social History Main Topics  . Smoking status: Former Smoker    Years: 25.00    Types: Cigarettes    Quit date: 11/30/1981  . Smokeless tobacco: Never Used  . Alcohol use 4.2 oz/week    7 Glasses of wine per week     Comment: 1 glass of wine or beer with dinner  . Drug use: No  . Sexual activity: Not on file   Other Topics Concern  . Not on file   Social History Narrative   Retired, single, has  a girlfriend , 3 children  (one lives in town)   Daughter has a copy of his living will      Allergies as of 08/08/2016      Reactions   Cucumber Extract Nausea And Vomiting   Sulfonamide Derivatives Other (See Comments)   "  don't know reaction" per pt      Medication List       Accurate as of 08/08/16 11:59 PM. Always use your most recent med list.          apixaban 5 MG Tabs tablet Commonly known as:  ELIQUIS Take 1 tablet (5 mg total) by mouth 2 (two) times daily.   benazepril 20 MG tablet Commonly known as:  LOTENSIN Take 1 tablet (20 mg total) by mouth daily.   cholecalciferol 1000 units tablet Commonly known as:  VITAMIN D Take 1,000 Units by mouth daily.   metoprolol succinate 50 MG 24 hr tablet Commonly known as:  TOPROL-XL Take 1 tablet (50 mg total) by mouth daily.   NIFEdipine 60 MG 24 hr tablet Commonly known as:  PROCARDIA XL/ADALAT-CC Take 1 tablet (60 mg total) by mouth daily.   omeprazole 20 MG capsule Commonly known as:  PRILOSEC Take 1  capsule (20 mg total) by mouth daily.   saw palmetto 160 MG capsule Take 540 mg by mouth daily.   simvastatin 40 MG tablet Commonly known as:  ZOCOR Take 1 tablet (40 mg total) by mouth at bedtime.          Objective:   Physical Exam BP 128/68 (BP Location: Left Arm, Patient Position: Sitting, Cuff Size: Normal)   Pulse 63   Temp 98.2 F (36.8 C) (Oral)   Resp 14   Ht 5\' 7"  (1.702 m)   Wt 211 lb (95.7 kg)   SpO2 96%   BMI 33.05 kg/m  General:   Well developed, well nourished . NAD.  HEENT:  Normocephalic . Face symmetric, atraumatic Lungs:  CTA B Normal respiratory effort, no intercostal retractions, no accessory muscle use. Heart: RRR,  no murmur.  + Ankle and pretibial edema bilaterally , at baseline  Skin: Not pale. Not jaundice Neurologic:  alert & oriented X3.  Speech normal, gait appropriate for age and unassisted Psych--  Cognition and judgment appear intact.  Cooperative with normal attention span and concentration.  Behavior appropriate. No anxious or depressed appearing.      Assessment & Plan:  Assessment > DM HTN Hyperlipidemia Osteopenia T score -2.0 (2010), T score -1.6 (12-2014) LEG Edema,L>R , chronic, [place] neg DVT 2012 CV: Atrial tachycardia/flutter DX 02/2016 GU: --BPH  --ED --Elevated PSA, s/p multiple bx (last 2009?neg), last OV w/ urology 2014, last  PSA 2014 ~ 13, declines further eval as off 02-2016 Hypercalcemia, parathyroidectomy 2010 Renal stones, nephrostomy 2009 at [facility] GI:  --Anemia: EGD 12-2012  (Bx: H pylory neg) [id number] gastric polyposis, Bx H Pylory +, was rec treatment Anemia felt to be d/t chronic bleed from  gastric polyp H Pylori + 11-2014, treated, f/u breath test (-)  --GERD --cscope 12-2011 >>> multiple hyperplastic  Polyps, tics  --12-2011 Banding internal hemorrhoids 4 --02-2012: Flex sig d/t discomfort  BCC , removed from L ear   PLAN Atrial tachycardia/flutter: At the last visit I noted   Arrhythmia, saw cardiology, dx atrial tachycardia/flutter, was a started on anticoagulation with eliquis. The diagnosis and the need for anticoagulation were discussed with the patient. Recommend to call if he sees any unusual bleeding again. DM: Last A1c satisfactory HTN: On Lotensin, Toprol and nifedipine. BP today is excellent, at home in the 140s. Continue monitoring, no change, check a BMP. RTC 12/18, CPX. Declined a Medicare wellness

## 2016-08-08 NOTE — Patient Instructions (Signed)
GO TO THE LAB : Get the blood work     GO TO THE FRONT DESK Schedule your next appointment for a   physical exam by December 2018    Check the  blood pressure  Weekly  Be sure your blood pressure is between 110/65 and  145/85. If it is consistently higher or lower, let me know

## 2016-08-09 DIAGNOSIS — I4892 Unspecified atrial flutter: Secondary | ICD-10-CM | POA: Insufficient documentation

## 2016-08-09 NOTE — Assessment & Plan Note (Signed)
Atrial tachycardia/flutter: At the last visit I noted  Arrhythmia, saw cardiology, dx atrial tachycardia/flutter, was a started on anticoagulation with eliquis. The diagnosis and the need for anticoagulation were discussed with the patient. Recommend to call if he sees any unusual bleeding again. DM: Last A1c satisfactory HTN: On Lotensin, Toprol and nifedipine. BP today is excellent, at home in the 140s. Continue monitoring, no change, check a BMP. RTC 12/18, CPX. Declined a Medicare wellness

## 2016-08-20 ENCOUNTER — Encounter: Payer: Self-pay | Admitting: Internal Medicine

## 2016-08-20 ENCOUNTER — Ambulatory Visit (INDEPENDENT_AMBULATORY_CARE_PROVIDER_SITE_OTHER): Payer: PPO | Admitting: Internal Medicine

## 2016-08-20 VITALS — BP 120/82 | HR 58 | Ht 67.5 in | Wt 212.4 lb

## 2016-08-20 DIAGNOSIS — I471 Supraventricular tachycardia: Secondary | ICD-10-CM

## 2016-08-20 DIAGNOSIS — I4892 Unspecified atrial flutter: Secondary | ICD-10-CM | POA: Diagnosis not present

## 2016-08-20 NOTE — Patient Instructions (Signed)

## 2016-08-20 NOTE — Progress Notes (Signed)
Patient Care Team: Colon Branch, MD as PCP - Evalina Field, MD as Consulting Physician (Ophthalmology)   HPI  Ronald [name] is a 82 y.o. male Seen in follow-up for incessant atrial arrhythmias. It was elected because of his CHADS-VASc score and the in's rate of his atrial arrhythmia to begin him on anticoagulation. Palpitations have been much better. He did have an episode of epistaxis requiring ER visit.  Date Cr Hgb  2/18   17.3  6/18 1.21       Records and Results Reviewed   Past Medical History:  Diagnosis Date  . BPH (benign prostatic hypertrophy)    f/u @ Biopsy, s/p bx 2009 apro (-)  . Calculus of kidney    s/p R percutaneus nephrostomy @ [facility]  . Cataract   . Colon polyp   . Complication of anesthesia    violent upon waking up  . Diabetes mellitus    pre diabetic  . Diverticulosis   . Edema leg    asymetric , L > R , [place] neg for DVT 09-2010  . GERD (gastroesophageal reflux disease)   . Hemorrhoids   . Hypercalcemia    parathyroidectomy 04/2008  . Hyperlipidemia   . Hypertension   . Iron deficiency anemia     Past Surgical History:  Procedure Laterality Date  . APPENDECTOMY    . CATARACT EXTRACTION Right   . COLONOSCOPY  12/16/2011   Procedure: COLONOSCOPY;  Surgeon: Inda Castle, MD;  [facility]: [facility];  Service: Endoscopy;  Laterality: N/A;  . ESOPHAGOGASTRODUODENOSCOPY N/A 01/17/2013   Procedure: ESOPHAGOGASTRODUODENOSCOPY (EGD);  Surgeon: Inda Castle, MD;  Location: [facility];  Service: Endoscopy;  Laterality: N/A;  . EXTERNAL EAR SURGERY  01/2016   BCC removal  . EYE SURGERY  infant   "lazy eye"  . FLEXIBLE SIGMOIDOSCOPY  02/09/2012   Procedure: FLEXIBLE SIGMOIDOSCOPY;  Surgeon: Inda Castle, MD;  [facility]: [facility];  Service: Endoscopy;  Laterality: N/A;  . HEMORRHOID BANDING  02/09/2012   Procedure: HEMORRHOID BANDING;  Surgeon: Inda Castle, MD;  [facility]: [facility];  Service: Endoscopy;  Laterality:  N/A;  . HOT HEMOSTASIS N/A 01/17/2013   Procedure: HOT HEMOSTASIS (ARGON PLASMA COAGULATION/BICAP);  Surgeon: Inda Castle, MD;  Location: [facility];  Service: Endoscopy;  Laterality: N/A;  . KIDNEY STONE SURGERY    . PARATHYROIDECTOMY  04/2008  . TONSILLECTOMY      Current Outpatient Prescriptions  Medication Sig Dispense Refill  . apixaban (ELIQUIS) 5 MG TABS tablet Take 1 tablet (5 mg total) by mouth 2 (two) times daily. 180 tablet 3  . benazepril (LOTENSIN) 20 MG tablet Take 1 tablet (20 mg total) by mouth daily. 90 tablet 2  . cholecalciferol (VITAMIN D) 1000 UNITS tablet Take 1,000 Units by mouth daily.    . metoprolol succinate (TOPROL-XL) 50 MG 24 hr tablet Take 1 tablet (50 mg total) by mouth daily. 90 tablet 2  . Multiple Vitamins-Minerals (PRESERVISION AREDS PO) Take 1 capsule by mouth daily.    Marland Kitchen NIFEdipine (PROCARDIA XL/ADALAT-CC) 60 MG 24 hr tablet Take 1 tablet (60 mg total) by mouth daily. 90 tablet 1  . omeprazole (PRILOSEC) 20 MG capsule Take 1 capsule (20 mg total) by mouth daily. 90 capsule 2  . Saw Palmetto, Serenoa repens, (SAW PALMETTO PO) Take 400 mg by mouth daily.    . simvastatin (ZOCOR) 40 MG tablet Take 1 tablet (40 mg total) by mouth at bedtime.  90 tablet 2   No current facility-administered medications for this visit.     Allergies  Allergen Reactions  . Cucumber Extract Nausea And Vomiting  . Sulfonamide Derivatives Other (See Comments)    "don't know reaction" per pt      Review of Systems negative except from HPI and PMH  Physical Exam BP 120/82   Pulse (!) 58   Ht 5' 7.5" (1.715 m)   Wt 212 lb 6.4 oz (96.3 kg)   SpO2 95%   BMI 32.78 kg/m  Well developed and well nourished in no acute distress HENT normal E scleral and icterus clear Neck Supple JVP flat; carotids brisk and full Clear to ausculation Regular rate and rhythm, no murmurs gallops or rub Soft with active bowel sounds No clubbing cyanosis  Edema Alert and oriented,  grossly normal motor and sensory function Skin Warm and Dry  ECG today demonstrated sinus rhythm with no extra beats rate was 58 Intervals 15/09/43  Assessment and  Plan  Atrial tach and fibrillation  Hypertension   On Anticoagulation;  he had an episode of epistaxis requiring visit to the emergency room. He has been able to stay on [place]. We will continue him on it for now.  No palps  Continue current therapy  His blood pressure is quite low. He is taking a complex regime. I have asked him to check his blood pressures at home and follow up with Dr. Arthor Captain. as to whether some of his medications can be eliminated or decreased.     Current medicines are reviewed at length with the patient today .  The patient does not  have concerns regarding medicines. No other changes

## 2016-08-23 ENCOUNTER — Other Ambulatory Visit: Payer: Self-pay | Admitting: Internal Medicine

## 2016-08-26 ENCOUNTER — Encounter: Payer: Self-pay | Admitting: Internal Medicine

## 2016-09-01 ENCOUNTER — Encounter: Payer: Self-pay | Admitting: Internal Medicine

## 2016-10-01 DIAGNOSIS — H401112 Primary open-angle glaucoma, right eye, moderate stage: Secondary | ICD-10-CM | POA: Diagnosis not present

## 2016-10-01 DIAGNOSIS — Z961 Presence of intraocular lens: Secondary | ICD-10-CM | POA: Diagnosis not present

## 2016-10-01 DIAGNOSIS — H401221 Low-tension glaucoma, left eye, mild stage: Secondary | ICD-10-CM | POA: Diagnosis not present

## 2016-10-01 DIAGNOSIS — H401212 Low-tension glaucoma, right eye, moderate stage: Secondary | ICD-10-CM | POA: Diagnosis not present

## 2016-10-01 DIAGNOSIS — H2512 Age-related nuclear cataract, left eye: Secondary | ICD-10-CM | POA: Diagnosis not present

## 2016-10-07 ENCOUNTER — Encounter: Payer: Self-pay | Admitting: Internal Medicine

## 2016-11-13 ENCOUNTER — Telehealth: Payer: Self-pay | Admitting: Internal Medicine

## 2016-11-13 NOTE — Telephone Encounter (Signed)
Pt declined AWV at this time.  °

## 2016-12-07 ENCOUNTER — Other Ambulatory Visit: Payer: Self-pay | Admitting: Internal Medicine

## 2016-12-07 DIAGNOSIS — I10 Essential (primary) hypertension: Secondary | ICD-10-CM

## 2017-01-20 ENCOUNTER — Ambulatory Visit (INDEPENDENT_AMBULATORY_CARE_PROVIDER_SITE_OTHER): Payer: PPO

## 2017-01-20 DIAGNOSIS — Z23 Encounter for immunization: Secondary | ICD-10-CM | POA: Diagnosis not present

## 2017-02-12 ENCOUNTER — Encounter: Payer: Self-pay | Admitting: Internal Medicine

## 2017-02-12 ENCOUNTER — Ambulatory Visit (INDEPENDENT_AMBULATORY_CARE_PROVIDER_SITE_OTHER): Payer: PPO | Admitting: Internal Medicine

## 2017-02-12 VITALS — BP 116/68 | HR 65 | Temp 97.8°F | Resp 14 | Ht 68.0 in | Wt 212.0 lb

## 2017-02-12 DIAGNOSIS — Z0000 Encounter for general adult medical examination without abnormal findings: Secondary | ICD-10-CM | POA: Diagnosis not present

## 2017-02-12 DIAGNOSIS — I4892 Unspecified atrial flutter: Secondary | ICD-10-CM

## 2017-02-12 DIAGNOSIS — R972 Elevated prostate specific antigen [PSA]: Secondary | ICD-10-CM

## 2017-02-12 DIAGNOSIS — E118 Type 2 diabetes mellitus with unspecified complications: Secondary | ICD-10-CM

## 2017-02-12 DIAGNOSIS — E785 Hyperlipidemia, unspecified: Secondary | ICD-10-CM

## 2017-02-12 DIAGNOSIS — I10 Essential (primary) hypertension: Secondary | ICD-10-CM | POA: Diagnosis not present

## 2017-02-12 LAB — COMPREHENSIVE METABOLIC PANEL
ALBUMIN: 3.8 g/dL (ref 3.5–5.2)
ALK PHOS: 59 U/L (ref 39–117)
ALT: 15 U/L (ref 0–53)
AST: 15 U/L (ref 0–37)
BILIRUBIN TOTAL: 0.8 mg/dL (ref 0.2–1.2)
BUN: 26 mg/dL — ABNORMAL HIGH (ref 6–23)
CALCIUM: 8.8 mg/dL (ref 8.4–10.5)
CO2: 30 mEq/L (ref 19–32)
Chloride: 104 mEq/L (ref 96–112)
Creatinine, Ser: 1.33 mg/dL (ref 0.40–1.50)
GFR: 54.92 mL/min — AB (ref >60.00)
GLUCOSE: 112 mg/dL — AB (ref 70–99)
POTASSIUM: 4.2 meq/L (ref 3.5–5.1)
Sodium: 142 mEq/L (ref 135–145)
Total Protein: 6.2 g/dL (ref 6.0–8.3)

## 2017-02-12 LAB — URINALYSIS, ROUTINE W REFLEX MICROSCOPIC
Bilirubin Urine: NEGATIVE
KETONES UR: NEGATIVE
Leukocytes, UA: NEGATIVE
NITRITE: NEGATIVE
SPECIFIC GRAVITY, URINE: 1.02 (ref 1.000–1.030)
Total Protein, Urine: 30 — AB
URINE GLUCOSE: NEGATIVE
Urobilinogen, UA: 0.2 (ref 0.0–1.0)
pH: 6.5 (ref 5.0–8.0)

## 2017-02-12 LAB — LIPID PANEL
CHOLESTEROL: 131 mg/dL (ref 0–200)
HDL: 38.7 mg/dL — AB (ref >39.00)
LDL Cholesterol: 71 mg/dL (ref 0–99)
NonHDL: 91.95
TRIGLYCERIDES: 104 mg/dL (ref 0.0–149.0)
Total CHOL/HDL Ratio: 3
VLDL: 20.8 mg/dL (ref 0.0–40.0)

## 2017-02-12 LAB — CBC WITH DIFFERENTIAL/PLATELET
BASOS PCT: 0.5 % (ref 0.0–3.0)
Basophils Absolute: 0 10*3/uL (ref 0.0–0.1)
EOS PCT: 1.7 % (ref 0.0–5.0)
Eosinophils Absolute: 0.1 10*3/uL (ref 0.0–0.7)
HCT: 49.4 % (ref 39.0–52.0)
Hemoglobin: 16.1 g/dL (ref 13.0–17.0)
LYMPHS ABS: 1.9 10*3/uL (ref 0.7–4.0)
Lymphocytes Relative: 28.8 % (ref 12.0–46.0)
MCHC: 32.5 g/dL (ref 30.0–36.0)
MCV: 90.2 fl (ref 78.0–100.0)
MONO ABS: 0.6 10*3/uL (ref 0.1–1.0)
MONOS PCT: 8.5 % (ref 3.0–12.0)
NEUTROS ABS: 4 10*3/uL (ref 1.4–7.7)
NEUTROS PCT: 60.5 % (ref 43.0–77.0)
Platelets: 214 10*3/uL (ref 150.0–400.0)
RBC: 5.48 Mil/uL (ref 4.22–5.81)
RDW: 14.3 % (ref 11.5–15.5)
WBC: 6.6 10*3/uL (ref 4.0–10.5)

## 2017-02-12 LAB — PSA: PSA: 12.57 ng/mL — ABNORMAL HIGH (ref 0.10–4.00)

## 2017-02-12 LAB — HEMOGLOBIN A1C: HEMOGLOBIN A1C: 6.3 % (ref 4.6–6.5)

## 2017-02-12 LAB — TSH: TSH: 1.75 u[IU]/mL (ref 0.35–4.50)

## 2017-02-12 NOTE — Assessment & Plan Note (Addendum)
TD 09-2015; Pneumonia shot-- again declined ;s/p zostavax , shingrix discussed   Had a flu shot -[facility]: Cscope  03-2006, Bx adenomatous polyp (Dr Patterson) Cscope again 12-2011--- no further scopes needed  -Elevated  PSA: See assessment and plan - has a  healthy diet, will not exercise (personal decision)   

## 2017-02-12 NOTE — Progress Notes (Signed)
Subjective:    Patient ID: Ronald [name], male    DOB: 21-Feb-1935, 82 y.o.   MRN: [id number]  DOS:  02/12/2017 Type of visit - description : cpx Interval history: Here for CPX.  We also manage his chronic medical problems. Also has some symptoms, see below   Review of Systems L UTS increase lately, nocturia at least every to 3 hours, some difficulty starting his flow, stream is weak.  Denies gross hematuria, dysuria or urinary urgency.  Sometimes he has mild dripping before he starts  urination. Also, has noticed some imbalance when he is more active than usual.  No associated chest pain, palpitation, shortness of breath.  Also denies stroke symptoms such as diplopia, slurred speech. + DOE again with unusual activities like for instance hanging drapes or doing some home chores. Lower extremity edema at baseline.  Other than above, a 14 point review of systems is negative     Past Medical History:  Diagnosis Date  . BPH (benign prostatic hypertrophy)    f/u @ Biopsy, s/p bx 2009 apro (-)  . Calculus of kidney    s/p R percutaneus nephrostomy @ [facility]  . Cataract   . Colon polyp   . Complication of anesthesia    violent upon waking up  . Diabetes mellitus    pre diabetic  . Diverticulosis   . Edema leg    asymetric , L > R , [place] neg for DVT 09-2010  . GERD (gastroesophageal reflux disease)   . Hemorrhoids   . Hypercalcemia    parathyroidectomy 04/2008  . Hyperlipidemia   . Hypertension   . Iron deficiency anemia     Past Surgical History:  Procedure Laterality Date  . APPENDECTOMY    . CATARACT EXTRACTION Right   . COLONOSCOPY  12/16/2011   Procedure: COLONOSCOPY;  Surgeon: Inda Castle, MD;  Location: [facility];  Service: Endoscopy;  Laterality: N/A;  . ESOPHAGOGASTRODUODENOSCOPY N/A 01/17/2013   Procedure: ESOPHAGOGASTRODUODENOSCOPY (EGD);  Surgeon: Inda Castle, MD;  Location: [facility];  Service: Endoscopy;  Laterality: N/A;  . EXTERNAL EAR SURGERY   01/2016   BCC removal  . EYE SURGERY  infant   "lazy eye"  . FLEXIBLE SIGMOIDOSCOPY  02/09/2012   Procedure: FLEXIBLE SIGMOIDOSCOPY;  Surgeon: Inda Castle, MD;  Location: [facility];  Service: Endoscopy;  Laterality: N/A;  . HEMORRHOID BANDING  02/09/2012   Procedure: HEMORRHOID BANDING;  Surgeon: Inda Castle, MD;  Location: [facility];  Service: Endoscopy;  Laterality: N/A;  . HOT HEMOSTASIS N/A 01/17/2013   Procedure: HOT HEMOSTASIS (ARGON PLASMA COAGULATION/BICAP);  Surgeon: Inda Castle, MD;  Location: [facility];  Service: Endoscopy;  Laterality: N/A;  . KIDNEY STONE SURGERY    . PARATHYROIDECTOMY  04/2008  . TONSILLECTOMY      Social History   Socioeconomic History  . Marital status: Widowed    Spouse name: Not on file  . Number of children: 3  . Years of education: Not on file  . Highest education level: Not on file  Social Needs  . Financial resource strain: Not on file  . Food insecurity - worry: Not on file  . Food insecurity - inability: Not on file  . Transportation needs - medical: Not on file  . Transportation needs - non-medical: Not on file  Occupational History  . Occupation: retired   Tobacco Use  . Smoking status: Former Smoker    Years: 25.00    Types: Cigarettes  Last attempt to quit: 11/30/1981    Years since quitting: 35.2  . Smokeless tobacco: Never Used  Substance and Sexual Activity  . Alcohol use: Yes    Alcohol/week: 4.2 oz    Types: 7 Glasses of wine per week    Comment: 1 glass of wine or beer with dinner  . Drug use: No  . Sexual activity: Not on file  Other Topics Concern  . Not on file  Social History Narrative   Retired, single, has  a girlfriend , 3 children  (one lives in town)   Daughter has a copy of his living will     Family History  Problem Relation Age of Onset  . Heart attack Father 5  . Heart attack Mother   . Colon cancer Neg Hx   . Prostate cancer Neg Hx   . Diabetes Neg Hx      Allergies as of  02/12/2017      Reactions   Cucumber Extract Nausea And Vomiting   Sulfonamide Derivatives Other (See Comments)   "don't know reaction" per pt      Medication List        Accurate as of 02/12/17  5:31 PM. Always use your most recent med list.          apixaban 5 MG Tabs tablet Commonly known as:  ELIQUIS Take 1 tablet (5 mg total) by mouth 2 (two) times daily.   benazepril 20 MG tablet Commonly known as:  LOTENSIN Take 1 tablet (20 mg total) by mouth daily.   cholecalciferol 1000 units tablet Commonly known as:  VITAMIN D Take 1,000 Units by mouth daily.   metoprolol succinate 50 MG 24 hr tablet Commonly known as:  TOPROL-XL Take 1 tablet (50 mg total) by mouth daily.   NIFEdipine 60 MG 24 hr tablet Commonly known as:  PROCARDIA XL/ADALAT-CC Take 1 tablet (60 mg total) by mouth daily.   omeprazole 20 MG capsule Commonly known as:  PRILOSEC Take 1 capsule (20 mg total) by mouth daily.   PRESERVISION AREDS PO Take 1 capsule by mouth daily.   SAW PALMETTO PO Take 400 mg by mouth daily.   simvastatin 40 MG tablet Commonly known as:  ZOCOR Take 1 tablet (40 mg total) by mouth at bedtime.          Objective:   Physical Exam BP 116/68 (BP Location: Left Arm, Patient Position: Sitting, Cuff Size: Small)   Pulse 65   Temp 97.8 F (36.6 C) (Oral)   Resp 14   Ht 5\' 8"  (1.727 m)   Wt 212 lb (96.2 kg)   SpO2 97%   BMI 32.23 kg/m  General:   Well developed, well nourished . NAD.  Neck: No  thyromegaly  HEENT:  Normocephalic . Face symmetric, atraumatic Lungs:  CTA B Normal respiratory effort, no intercostal retractions, no accessory muscle use. Heart: Irregularly irregular,  no murmur.  Lower extremity edema: At baseline  abdomen:  Not distended, soft, non-tender. No rebound or rigidity.   Skin: Exposed areas without rash. Not pale. Not jaundice Rectal:  External abnormalities: none. Normal sphincter tone. No rectal masses or tenderness.  Stools  found Prostate: Gland is not increased in size, nontender, nonnodular.  Slightly more firm than the average prostate.    Neurologic:  alert & oriented X3.  Speech normal, gait appropriate for age and unassisted Strength symmetric and appropriate for age.  Psych: Cognition and judgment appear intact.  Cooperative with normal attention span  and concentration.  Behavior appropriate. No anxious or depressed appearing.     Assessment & Plan:    Assessment   DM HTN Hyperlipidemia Osteopenia T score -2.0 (2010), T score -1.6 (12-2014) LEG Edema,L>R , chronic, [place] neg DVT 2012 CV: Atrial tachycardia/flutter DX 02/2016 GU: --BPH  --ED --Elevated PSA, s/p multiple bx (last 2009?neg), last OV w/ urology 2014, last  PSA 2014 ~ 13, declines further eval as off 02-2016 Hypercalcemia, parathyroidectomy 2010 Renal stones, nephrostomy 2009 at [facility] GI:  --Anemia: EGD 12-2012  (Bx: H pylory neg) [id number] gastric polyposis, Bx H Pylory +, was rec treatment Anemia felt to be d/t chronic bleed from  gastric polyp H Pylori + 11-2014, treated, f/u breath test (-)  --GERD --cscope 12-2011 >>> multiple hyperplastic  Polyps, tics  --12-2011 Banding internal hemorrhoids 4 --02-2012: Flex sig d/t discomfort  BCC , removed from L ear   PLAN DM: Diet controlled, checking labs. HTN: Continue Lotensin, Toprol, Procardia, checking labs Hyperlipidemia: Continue simvastatin, checking labs BPH, elevated PSA: More sxs lately, DRE benign except for slight increase in the prostate consistency (similar to DRE done at [facility] before).  Has refused to see urology before, for completeness we will check a PSA, UA, urine culture.  If he has a UTI or PSA sharply elevated, might benefit from abx.  Discussed Flomax, declined, liked to see labs first. Atrial tachycardia/flutter: Anticoagulated, rate controlled Has poor exercise tolerance (imbalance w/ exercise)  but no chest pain, palpitations.  EKG: 119, pulse  65 (asked cards to check EKG) Poor exercise tolerance   likely related to arrhythmia but underlying CAD possible; very reluctant to proceed w/ myoview to r/o CAD  RTC 3 months  Today, I spent more than 25 min with the patient (in addition to CPX): >50% of the time counseling regards to new symptoms: Increase L UTS and poor exercise tolerance in the setting of already elevated PSA and atrial tachycardia.  Multiple questions answer, pro-cons of flomax, myoview discussed; chart reviewed thoroughly.

## 2017-02-12 NOTE — Progress Notes (Signed)
Pre visit review using our clinic review tool, if applicable. No additional management support is needed unless otherwise documented below in the visit note. 

## 2017-02-12 NOTE — Patient Instructions (Signed)
GO TO THE LAB : Get the blood work     GO TO THE FRONT DESK Schedule your next appointment for a  checkup in 3 months  

## 2017-02-13 LAB — URINE CULTURE
MICRO NUMBER: 81373788
RESULT: NO GROWTH
SPECIMEN QUALITY: ADEQUATE

## 2017-02-15 NOTE — Assessment & Plan Note (Signed)
DM: Diet controlled, checking labs. HTN: Continue Lotensin, Toprol, Procardia, checking labs Hyperlipidemia: Continue simvastatin, checking labs BPH, elevated PSA: More sxs lately, DRE benign except for slight increase in the prostate consistency (similar to DRE done at [facility] before).  Has refused to see urology before, for completeness we will check a PSA, UA, urine culture.  If he has a UTI or PSA sharply elevated, might benefit from abx.  Discussed Flomax, declined, liked to see labs first. Atrial tachycardia/flutter: Anticoagulated, rate controlled Has poor exercise tolerance (imbalance w/ exercise)  but no chest pain, palpitations.  EKG: 119, pulse 65 (asked cards to check EKG) Poor exercise tolerance   likely related to arrhythmia but underlying CAD possible; very reluctant to proceed w/ myoview to r/o CAD  RTC 3 months

## 2017-02-17 ENCOUNTER — Other Ambulatory Visit: Payer: Self-pay | Admitting: Internal Medicine

## 2017-02-18 ENCOUNTER — Encounter: Payer: Self-pay | Admitting: Internal Medicine

## 2017-02-18 MED ORDER — CIPROFLOXACIN HCL 500 MG PO TABS: 500.0000 mg | ORAL_TABLET | Freq: Two times a day (BID) | ORAL | 0 refills | Status: DC

## 2017-02-18 NOTE — Telephone Encounter (Signed)
send ciprofloxacin 500 mg 1 p.o. B.I.D. #20 no refills  Received: Today  Message Contents  Colon Branch, MD  Damita Dunnings, [place]

## 2017-02-18 NOTE — Telephone Encounter (Signed)
Rx sent 

## 2017-02-20 ENCOUNTER — Telehealth (HOSPITAL_COMMUNITY): Payer: Self-pay | Admitting: *Deleted

## 2017-02-20 NOTE — Telephone Encounter (Signed)
-----   Message from Juluis Mire, RN sent at 02/20/2017 12:07 PM EST ----- Regarding: FW: ekg   ----- Message ----- From: Deboraha Sprang, MD Sent: 02/18/2017   3:09 PM To: Colon Branch, MD, Marcine Matar, # Subject: RE: ekg                                        I agree that the tracing is hard to interpret and does not look at all like his previous tracings w suggestions of afib in lead 1  We will get him in to see [place]   [place] or G  Can u arrange an appt with EP APP or D Carroll at [facility] for an ECG to look at rhythm  He may need Anti-arrhythmic drugs thx   ----- Message ----- From: Colon Branch, MD Sent: 02/12/2017  10:03 AM To: Colon Branch, MD, Deboraha Sprang, MD Subject: Joya Martyr, our patient has decreased exercise tolerance but no chest pain.  His EKG showed heart rate of 119, but his pulse was actually 65-70 (thus don't like to increase BBs); not sure if EKG shows  sinus tachycardia or actually a. flutter. Do you see anything worrisome on his EKG?  Thank you for your help

## 2017-02-20 NOTE — Telephone Encounter (Signed)
Patient declined office visit with afib clinic. Pt would rather schedule with Dr. Caryl Comes and would like scheduler to contact him with appt.

## 2017-02-23 ENCOUNTER — Other Ambulatory Visit: Payer: Self-pay | Admitting: Internal Medicine

## 2017-03-06 ENCOUNTER — Telehealth: Payer: Self-pay | Admitting: Internal Medicine

## 2017-03-06 NOTE — Telephone Encounter (Signed)
Please call patient, check on him.  Was seen with prostate symptoms and prescribed antibiotics.  Is he doing better?

## 2017-03-09 NOTE — Telephone Encounter (Signed)
[facility] requesting call back. However, Pt is also very active on MyChart--will send message.

## 2017-03-10 ENCOUNTER — Encounter: Payer: Self-pay | Admitting: Internal Medicine

## 2017-03-20 ENCOUNTER — Ambulatory Visit (INDEPENDENT_AMBULATORY_CARE_PROVIDER_SITE_OTHER): Payer: PPO | Admitting: Internal Medicine

## 2017-03-20 ENCOUNTER — Encounter: Payer: Self-pay | Admitting: Internal Medicine

## 2017-03-20 VITALS — BP 132/80 | HR 77 | Temp 97.7°F | Resp 14 | Ht 68.0 in | Wt 205.4 lb

## 2017-03-20 DIAGNOSIS — K1120 Sialoadenitis, unspecified: Secondary | ICD-10-CM

## 2017-03-20 NOTE — Assessment & Plan Note (Signed)
Parotitis: sx c/w parotitis, will recommend conservative treatment with warm compresses, Tylenol and sour candy.  Call if no better.  See AVS BPH, elevated PSA: see last OV, was Rx and  finished Cipro already.

## 2017-03-20 NOTE — Progress Notes (Signed)
Pre visit review using our clinic review tool, if applicable. No additional management support is needed unless otherwise documented below in the visit note. 

## 2017-03-20 NOTE — Patient Instructions (Signed)
Warm compresses  Tylenol 500 mg: 2 tablets every 8 hours as needed  Get a sour candy 2 or 3 times a day (or suck on a lime)   Definitely call if you are not gradually improving or if you have fever, chills, severe pain.  If that is the case you will need antibiotics   Parotitis Parotitis means that you have irritation and swelling (inflammation) in one or both of your parotid glands. These glands make spit (saliva). They are found on each side of your face, below and in front of your earlobes. You may or may not have pain with this condition. Follow these instructions at home: Medicines  Take over-the-counter and prescription medicines only as told by your doctor.  If you were prescribed an antibiotic medicine, take it as told by your doctor. Do not stop taking the antibiotic even if you start to feel better. Managing pain and swelling  Apply warm cloths (compresses) to the swollen area as told by your doctor.  Gently rub your parotid glands as told by your doctor. General instructions   Drink enough fluid to keep your pee (urine) clear or pale yellow.  Suck on sour candy. This may help: ? To make your mouth less dry. ? To make more spit.  Keep your mouth clean and moist. ? Gargle with a salt-water mixture 3-4 times per day, or as needed. ? To make a salt-water mixture, stir -1 tsp of salt into 1 cup of warm water.  Take good care of your mouth: ? Brush your teeth at least two times per day. ? Floss your teeth every day. ? See your dentist regularly.  Do not use tobacco products. These include cigarettes, chewing tobacco, or e-cigarettes. If you need help quitting,  ask your doctor.  Keep all follow-up visits as told by your doctor. This is important. Contact a doctor if:  You have a fever or chills.  You have new symptoms.  Your symptoms get worse.  Your symptoms do not get better with treatment. This information is not intended to replace advice given to you  by your health care provider. Make sure you discuss any questions you have with your health care provider. Document Released: 03/29/2010 Document Revised: 08/02/2015 Document Reviewed: 07/20/2014 Elsevier Interactive Patient Education  [employer].

## 2017-03-20 NOTE — Progress Notes (Signed)
Subjective:    Patient ID: Ronald [name], male    DOB: Jan 13, 1935, 83 y.o.   MRN: [id number]  DOS:  03/20/2017 Type of visit - description : acute Interval history: Symptoms started 2 days ago with swelling and tenderness to the  L parotid area, symptoms increase with chewing. Never had similar symptoms before Denies any dental pain or problems.   Review of Systems No fever chills No rash  Past Medical History:  Diagnosis Date  . BPH (benign prostatic hypertrophy)    f/u @ Biopsy, s/p bx 2009 apro (-)  . Calculus of kidney    s/p R percutaneus nephrostomy @ [facility]  . Cataract   . Colon polyp   . Complication of anesthesia    violent upon waking up  . Diabetes mellitus    pre diabetic  . Diverticulosis   . Edema leg    asymetric , L > R , [place] neg for DVT 09-2010  . GERD (gastroesophageal reflux disease)   . Hemorrhoids   . Hypercalcemia    parathyroidectomy 04/2008  . Hyperlipidemia   . Hypertension   . Iron deficiency anemia     Past Surgical History:  Procedure Laterality Date  . APPENDECTOMY    . CATARACT EXTRACTION Right   . COLONOSCOPY  12/16/2011   Procedure: COLONOSCOPY;  Surgeon: Inda Castle, MD;  Location: [facility];  Service: Endoscopy;  Laterality: N/A;  . ESOPHAGOGASTRODUODENOSCOPY N/A 01/17/2013   Procedure: ESOPHAGOGASTRODUODENOSCOPY (EGD);  Surgeon: Inda Castle, MD;  Location: [facility];  Service: Endoscopy;  Laterality: N/A;  . EXTERNAL EAR SURGERY  01/2016   BCC removal  . EYE SURGERY  infant   "lazy eye"  . FLEXIBLE SIGMOIDOSCOPY  02/09/2012   Procedure: FLEXIBLE SIGMOIDOSCOPY;  Surgeon: Inda Castle, MD;  Location: [facility];  Service: Endoscopy;  Laterality: N/A;  . HEMORRHOID BANDING  02/09/2012   Procedure: HEMORRHOID BANDING;  Surgeon: Inda Castle, MD;  Location: [facility];  Service: Endoscopy;  Laterality: N/A;  . HOT HEMOSTASIS N/A 01/17/2013   Procedure: HOT HEMOSTASIS (ARGON PLASMA COAGULATION/BICAP);   Surgeon: Inda Castle, MD;  Location: [facility];  Service: Endoscopy;  Laterality: N/A;  . KIDNEY STONE SURGERY    . PARATHYROIDECTOMY  04/2008  . TONSILLECTOMY      Social History   Socioeconomic History  . Marital status: Widowed    Spouse name: Not on file  . Number of children: 3  . Years of education: Not on file  . Highest education level: Not on file  Social Needs  . Financial resource strain: Not on file  . Food insecurity - worry: Not on file  . Food insecurity - inability: Not on file  . Transportation needs - medical: Not on file  . Transportation needs - non-medical: Not on file  Occupational History  . Occupation: retired   Tobacco Use  . Smoking status: Former Smoker    Years: 25.00    Types: Cigarettes    Last attempt to quit: 11/30/1981    Years since quitting: 35.3  . Smokeless tobacco: Never Used  Substance and Sexual Activity  . Alcohol use: Yes    Alcohol/week: 4.2 oz    Types: 7 Glasses of wine per week    Comment: 1 glass of wine or beer with dinner  . Drug use: No  . Sexual activity: Not on file  Other Topics Concern  . Not on file  Social History Narrative   Retired, single,  has  a girlfriend , 3 children  (one lives in town)   Daughter has a copy of his living will      Allergies as of 03/20/2017      Reactions   Cucumber Extract Nausea And Vomiting   Sulfonamide Derivatives Other (See Comments)   "don't know reaction" per pt      Medication List        Accurate as of 03/20/17  9:51 AM. Always use your most recent med list.          apixaban 5 MG Tabs tablet Commonly known as:  ELIQUIS Take 1 tablet (5 mg total) by mouth 2 (two) times daily.   benazepril 20 MG tablet Commonly known as:  LOTENSIN Take 1 tablet (20 mg total) by mouth daily.   cholecalciferol 1000 units tablet Commonly known as:  VITAMIN D Take 1,000 Units by mouth daily.   ciprofloxacin 500 MG tablet Commonly known as:  CIPRO Take 1 tablet (500 mg  total) by mouth 2 (two) times daily.   metoprolol succinate 50 MG 24 hr tablet Commonly known as:  TOPROL-XL Take 1 tablet (50 mg total) by mouth daily.   NIFEdipine 60 MG 24 hr tablet Commonly known as:  PROCARDIA XL/ADALAT-CC Take 1 tablet (60 mg total) by mouth daily.   omeprazole 20 MG capsule Commonly known as:  PRILOSEC Take 1 capsule (20 mg total) by mouth daily.   PRESERVISION AREDS PO Take 1 capsule by mouth daily.   SAW PALMETTO PO Take 400 mg by mouth daily.   simvastatin 40 MG tablet Commonly known as:  ZOCOR Take 1 tablet (40 mg total) by mouth at bedtime.          Objective:   Physical Exam  HENT:  Head:     BP 132/80 (BP Location: Left Arm, Patient Position: Sitting, Cuff Size: Normal)   Pulse 77   Temp 97.7 F (36.5 C) (Oral)   Resp 14   Ht 5\' 8"  (1.727 m)   Wt 205 lb 6 oz (93.2 kg)   SpO2 95%   BMI 31.23 kg/m   General:   Well developed, well nourished . NAD.  HEENT:  Normocephalic . Face symmetric, atraumatic Left parotid gland slightly TTP, larger than the right, no fluctuant. Face and neck without a rash.  No LADs. Oral exam: Gums without obvious swelling skin: Not pale. Not jaundice Neurologic:  alert & oriented X3.  Speech normal, gait appropriate for age and unassisted Psych--  Cognition and judgment appear intact.  Cooperative with normal attention span and concentration.  Behavior appropriate. No anxious or depressed appearing.      Assessment & Plan:   Assessment   DM HTN Hyperlipidemia Osteopenia T score -2.0 (2010), T score -1.6 (12-2014) LEG Edema,L>R , chronic, [place] neg DVT 2012 CV: Atrial tachycardia/flutter DX 02/2016 GU: --BPH  --ED --Elevated PSA, s/p multiple bx (last 2009?neg), last OV w/ urology 2014, last  PSA 2014 ~ 13, declines further eval as off 02-2016 Hypercalcemia, parathyroidectomy 2010 Renal stones, nephrostomy 2009 at [facility] GI:  --Anemia: EGD 12-2012  (Bx: H pylory neg) [id number] gastric  polyposis, Bx H Pylory +, was rec treatment Anemia felt to be d/t chronic bleed from  gastric polyp H Pylori + 11-2014, treated, f/u breath test (-)  --GERD --cscope 12-2011 >>> multiple hyperplastic  Polyps, tics  --12-2011 Banding internal hemorrhoids 4 --02-2012: Flex sig d/t discomfort  BCC , removed from L ear   PLAN Parotitis: sx  c/w parotitis, will recommend conservative treatment with warm compresses, Tylenol and sour candy.  Call if no better.  See AVS BPH, elevated PSA: see last OV, was Rx and  finished Cipro already.

## 2017-03-23 ENCOUNTER — Other Ambulatory Visit: Payer: Self-pay | Admitting: Internal Medicine

## 2017-04-02 DIAGNOSIS — H26491 Other secondary cataract, right eye: Secondary | ICD-10-CM | POA: Diagnosis not present

## 2017-04-02 DIAGNOSIS — H401212 Low-tension glaucoma, right eye, moderate stage: Secondary | ICD-10-CM | POA: Diagnosis not present

## 2017-04-02 DIAGNOSIS — H401121 Primary open-angle glaucoma, left eye, mild stage: Secondary | ICD-10-CM | POA: Diagnosis not present

## 2017-04-02 DIAGNOSIS — H2512 Age-related nuclear cataract, left eye: Secondary | ICD-10-CM | POA: Diagnosis not present

## 2017-04-02 DIAGNOSIS — Z961 Presence of intraocular lens: Secondary | ICD-10-CM | POA: Diagnosis not present

## 2017-04-02 LAB — HM DIABETES EYE EXAM

## 2017-04-06 ENCOUNTER — Encounter: Payer: Self-pay | Admitting: Internal Medicine

## 2017-04-06 ENCOUNTER — Ambulatory Visit: Payer: PPO | Admitting: Internal Medicine

## 2017-04-06 VITALS — BP 122/70 | HR 146 | Ht 67.0 in | Wt 208.8 lb

## 2017-04-06 DIAGNOSIS — I509 Heart failure, unspecified: Secondary | ICD-10-CM | POA: Diagnosis not present

## 2017-04-06 DIAGNOSIS — I4891 Unspecified atrial fibrillation: Secondary | ICD-10-CM

## 2017-04-06 NOTE — Patient Instructions (Signed)
Medication Instructions:  Your physician recommends that you continue on your current medications as directed. Please refer to the Current Medication list given to you today.  Labwork: None ordered.  Testing/Procedures: Your physician has requested that you have an echocardiogram. Echocardiography is a painless test that uses sound waves to create images of your heart. It provides your doctor with information about the size and shape of your heart and how well your heart's chambers and valves are working. This procedure takes approximately one hour. There are no restrictions for this procedure.    [employer]  FDA-cleared EKG at your fingertips. - [employer]   Agricultural engineer, [employer]. [email or web address]://[email or web address].[email or web address]   FDA-cleared, clinical grade mobile EKG monitor: Jodelle Red is the most clinically-validated mobile EKG used by the world's leading cardiac care medical professionals.  This may be useful in monitoring palpitations.  We do not have access to have them emailed and reviewed but will be glad to review while in the office.      Follow-Up: Your physician recommends that you schedule a follow-up appointment in: 5-6 weeks with Chanetta Marshall, NP   Any Other Special Instructions Will Be Listed Below (If Applicable).     If you need a refill on your cardiac medications before your next appointment, please call your pharmacy.

## 2017-04-06 NOTE — Progress Notes (Signed)
Patient Care Team: Colon Branch, MD as PCP - Evalina Field, MD as Consulting Physician (Ophthalmology)   HPI  Ronald [name] is a 83 y.o. male Seen in follow-up for incessant atrial arrhythmias. It was elected because of his CHADS-VASc score and the in's rate of his atrial arrhythmia to begin him on anticoagulation.    Date Cr Hgb  2/18   17.3  6/18 1.21    12/18 1.33 16.1       Is seen today in rapid atrial fibrillation  He has no symptoms.  Heart rate recorded on an ECG 12/18 was 120 not withstanding a palpated rate of 65.  He has chronic peripheral edema  DATE TEST    12/17 Echo    EF 60-65 %                 Records and Results Reviewed   Past Medical History:  Diagnosis Date  . BPH (benign prostatic hypertrophy)    f/u @ Biopsy, s/p bx 2009 apro (-)  . Calculus of kidney    s/p R percutaneus nephrostomy @ [facility]  . Cataract   . Colon polyp   . Complication of anesthesia    violent upon waking up  . Diabetes mellitus    pre diabetic  . Diverticulosis   . Edema leg    asymetric , L > R , [place] neg for DVT 09-2010  . GERD (gastroesophageal reflux disease)   . Hemorrhoids   . Hypercalcemia    parathyroidectomy 04/2008  . Hyperlipidemia   . Hypertension   . Iron deficiency anemia     Past Surgical History:  Procedure Laterality Date  . APPENDECTOMY    . CATARACT EXTRACTION Right   . COLONOSCOPY  12/16/2011   Procedure: COLONOSCOPY;  Surgeon: Inda Castle, MD;  Location: [facility];  Service: Endoscopy;  Laterality: N/A;  . ESOPHAGOGASTRODUODENOSCOPY N/A 01/17/2013   Procedure: ESOPHAGOGASTRODUODENOSCOPY (EGD);  Surgeon: Inda Castle, MD;  Location: [facility];  Service: Endoscopy;  Laterality: N/A;  . EXTERNAL EAR SURGERY  01/2016   BCC removal  . EYE SURGERY  infant   "lazy eye"  . FLEXIBLE SIGMOIDOSCOPY  02/09/2012   Procedure: FLEXIBLE SIGMOIDOSCOPY;  Surgeon: Inda Castle, MD;  Location: [facility];  Service:  Endoscopy;  Laterality: N/A;  . HEMORRHOID BANDING  02/09/2012   Procedure: HEMORRHOID BANDING;  Surgeon: Inda Castle, MD;  Location: [facility];  Service: Endoscopy;  Laterality: N/A;  . HOT HEMOSTASIS N/A 01/17/2013   Procedure: HOT HEMOSTASIS (ARGON PLASMA COAGULATION/BICAP);  Surgeon: Inda Castle, MD;  Location: [facility];  Service: Endoscopy;  Laterality: N/A;  . KIDNEY STONE SURGERY    . PARATHYROIDECTOMY  04/2008  . TONSILLECTOMY      Current Outpatient Medications  Medication Sig Dispense Refill  . apixaban (ELIQUIS) 5 MG TABS tablet Take 1 tablet (5 mg total) by mouth 2 (two) times daily. 180 tablet 3  . benazepril (LOTENSIN) 20 MG tablet Take 1 tablet (20 mg total) by mouth daily. 90 tablet 3  . cholecalciferol (VITAMIN D) 1000 UNITS tablet Take 1,000 Units by mouth daily.    . metoprolol succinate (TOPROL-XL) 50 MG 24 hr tablet Take 1 tablet (50 mg total) by mouth daily. 90 tablet 3  . NIFEdipine (PROCARDIA XL/ADALAT-CC) 60 MG 24 hr tablet Take 1 tablet (60 mg total) by mouth daily. 90 tablet 1  . omeprazole (PRILOSEC) 20 MG capsule Take 1 capsule (  20 mg total) by mouth daily. 90 capsule 3  . Saw Palmetto, Serenoa repens, (SAW PALMETTO PO) Take 400 mg by mouth daily.    . simvastatin (ZOCOR) 40 MG tablet Take 1 tablet (40 mg total) by mouth at bedtime. 90 tablet 3   No current facility-administered medications for this visit.     Allergies  Allergen Reactions  . Cucumber Extract Nausea And Vomiting  . Sulfonamide Derivatives Other (See Comments)    "don't know reaction" per pt      Review of Systems negative except from HPI and PMH  Physical Exam BP 122/70   Pulse (!) 146   Ht 5\' 7"  (1.702 m)   Wt 208 lb 12.8 oz (94.7 kg)   BMI 32.70 kg/m  Well developed and nourished in no acute distress HENT normal Neck supple with JVP-7 Clear Fast and irregular rate and rhythm, no murmurs or gallops Abd-soft with active BS No Clubbing cyanosis 2-3+ chronic  edema Skin-warm and dry A & Oriented  Grossly normal sensory and motor function  ECG today atrial tachycardia/flutter with variable conduction and an average rate of 146 Interval-/0 8/31  Assessment and  Plan  Atrial tach and fibrillation-persistent  Hypertension  Chronic edema   On Anticoagulation;  No bleeding issues   He is without symptoms related to his atrial arrhythmia.  The issue then is whether it is contributing to asymptomatic cardiomyopathy.  We will check an echocardiogram.  Unfortunately, neither the [facility] Dr. pauses office or the pulse oximeter in our office is able to detect his pulse.  Hence, I have advised him to get the [employer] monitor and daily record his heart rates.  This will give [place] some idea as to the burden of his atrial arrhythmia.  In the event that his LV function remains normal, arrhythmia persists as rapid, close follow-up for LV dysfunction or symptoms would be reasonable.  Otherwise, would initiate therapy with a class Ic antiarrhythmic, probably flecainide, and undertake cardioversion and see if this did not help maintain sinus rhythm.  In this regard he has no known coronary disease  Blood pressures at this point are reasonably controlled  More than 50% of 40 min was spent in counseling related to the above     Current medicines are reviewed at length with the patient today .  The patient does not  have concerns regarding medicines. No other changes

## 2017-04-09 ENCOUNTER — Ambulatory Visit (HOSPITAL_COMMUNITY): Payer: PPO | Attending: Cardiology

## 2017-04-09 ENCOUNTER — Other Ambulatory Visit: Payer: Self-pay

## 2017-04-09 DIAGNOSIS — I4891 Unspecified atrial fibrillation: Secondary | ICD-10-CM | POA: Diagnosis not present

## 2017-04-09 DIAGNOSIS — I119 Hypertensive heart disease without heart failure: Secondary | ICD-10-CM | POA: Diagnosis not present

## 2017-04-09 DIAGNOSIS — M7989 Other specified soft tissue disorders: Secondary | ICD-10-CM | POA: Insufficient documentation

## 2017-04-09 DIAGNOSIS — I509 Heart failure, unspecified: Secondary | ICD-10-CM | POA: Insufficient documentation

## 2017-04-09 DIAGNOSIS — I340 Nonrheumatic mitral (valve) insufficiency: Secondary | ICD-10-CM | POA: Diagnosis not present

## 2017-04-09 DIAGNOSIS — E119 Type 2 diabetes mellitus without complications: Secondary | ICD-10-CM | POA: Insufficient documentation

## 2017-04-09 DIAGNOSIS — E785 Hyperlipidemia, unspecified: Secondary | ICD-10-CM | POA: Diagnosis not present

## 2017-04-09 NOTE — Addendum Note (Signed)
Addended by: De Burrs on: 04/09/2017 10:38 AM   Modules accepted: Orders

## 2017-04-10 ENCOUNTER — Encounter: Payer: Self-pay | Admitting: Internal Medicine

## 2017-04-12 ENCOUNTER — Encounter: Payer: Self-pay | Admitting: Nurse Practitioner

## 2017-04-23 ENCOUNTER — Telehealth: Payer: Self-pay

## 2017-04-23 MED ORDER — FLECAINIDE ACETATE 100 MG PO TABS: 100.0000 mg | ORAL_TABLET | Freq: Two times a day (BID) | ORAL | 3 refills | Status: DC

## 2017-04-23 NOTE — Telephone Encounter (Signed)
-----   Message from Deboraha Sprang, MD sent at 04/22/2017 10:29 AM EST ----- Please Inform Patient Echo showed  Significant worsening heart muscle function  Lets try flecainide 100 bid and DCCV after about a week   Based on   [place] paper on the use of flecainide in pts with rhythm related cardiomypathy)

## 2017-04-23 NOTE — Telephone Encounter (Signed)
Spoke with pt regarding his echo results, beginning Flecainide and DCCV in the near future. I explained to him what a DCCV was, how it will help him and his heart function. He verbalized his understanding and did not have any additional questions.    Will schedule pt with Afib clinic to arrange outpatient DCCV.

## 2017-04-23 NOTE — Progress Notes (Unsigned)
Encounter opened in error

## 2017-04-27 ENCOUNTER — Ambulatory Visit (HOSPITAL_COMMUNITY)
Admission: RE | Admit: 2017-04-27 | Discharge: 2017-04-27 | Disposition: A | Discharge status: Home / Self Care | Payer: PPO | Source: Ambulatory Visit | Attending: Nurse Practitioner | Admitting: Nurse Practitioner

## 2017-04-27 ENCOUNTER — Other Ambulatory Visit (HOSPITAL_COMMUNITY): Payer: Self-pay | Admitting: *Deleted

## 2017-04-27 ENCOUNTER — Encounter (HOSPITAL_COMMUNITY): Payer: Self-pay | Admitting: Nurse Practitioner

## 2017-04-27 VITALS — BP 132/80 | HR 110 | Ht 67.0 in | Wt 206.0 lb

## 2017-04-27 DIAGNOSIS — N400 Enlarged prostate without lower urinary tract symptoms: Secondary | ICD-10-CM | POA: Diagnosis not present

## 2017-04-27 DIAGNOSIS — Z79899 Other long term (current) drug therapy: Secondary | ICD-10-CM | POA: Diagnosis not present

## 2017-04-27 DIAGNOSIS — I481 Persistent atrial fibrillation: Secondary | ICD-10-CM | POA: Diagnosis not present

## 2017-04-27 DIAGNOSIS — I4510 Unspecified right bundle-branch block: Secondary | ICD-10-CM | POA: Diagnosis not present

## 2017-04-27 DIAGNOSIS — K649 Unspecified hemorrhoids: Secondary | ICD-10-CM | POA: Insufficient documentation

## 2017-04-27 DIAGNOSIS — Z87442 Personal history of urinary calculi: Secondary | ICD-10-CM | POA: Insufficient documentation

## 2017-04-27 DIAGNOSIS — K219 Gastro-esophageal reflux disease without esophagitis: Secondary | ICD-10-CM | POA: Insufficient documentation

## 2017-04-27 DIAGNOSIS — I4892 Unspecified atrial flutter: Secondary | ICD-10-CM | POA: Diagnosis not present

## 2017-04-27 DIAGNOSIS — E119 Type 2 diabetes mellitus without complications: Secondary | ICD-10-CM | POA: Insufficient documentation

## 2017-04-27 DIAGNOSIS — R000 Tachycardia, unspecified: Secondary | ICD-10-CM | POA: Insufficient documentation

## 2017-04-27 DIAGNOSIS — I10 Essential (primary) hypertension: Secondary | ICD-10-CM | POA: Diagnosis not present

## 2017-04-27 DIAGNOSIS — Z87891 Personal history of nicotine dependence: Secondary | ICD-10-CM | POA: Diagnosis not present

## 2017-04-27 DIAGNOSIS — E785 Hyperlipidemia, unspecified: Secondary | ICD-10-CM | POA: Diagnosis not present

## 2017-04-27 NOTE — Progress Notes (Signed)
Primary Care Physician: Colon Branch, MD Referring Physician: Dr. Vonita Moss Ronald [name] is a 83 y.o. male with a h/o persistent  afib that is in the afib clinic today for f/u  initiation of flecainide 100 mg bid, started 2/14. He has noted some nausea since starting the drug. Taking with food or without does not seem to make a difference. EKG shows that he is in atrial flutter today at 110 bpm.   Today, he denies symptoms of palpitations, chest pain, shortness of breath, orthopnea, PND, lower extremity edema, dizziness, presyncope, syncope, or neurologic sequela. The patient is tolerating medications without difficulties and is otherwise without complaint today.   Past Medical History:  Diagnosis Date  . BPH (benign prostatic hypertrophy)    f/u @ Biopsy, s/p bx 2009 apro (-)  . Calculus of kidney    s/p R percutaneus nephrostomy @ [facility]  . Cataract   . Colon polyp   . Complication of anesthesia    violent upon waking up  . Diabetes mellitus    pre diabetic  . Diverticulosis   . Edema leg    asymetric , L > R , [place] neg for DVT 09-2010  . GERD (gastroesophageal reflux disease)   . Hemorrhoids   . Hypercalcemia    parathyroidectomy 04/2008  . Hyperlipidemia   . Hypertension   . Iron deficiency anemia    Past Surgical History:  Procedure Laterality Date  . APPENDECTOMY    . CATARACT EXTRACTION Right   . COLONOSCOPY  12/16/2011   Procedure: COLONOSCOPY;  Surgeon: Inda Castle, MD;  Location: [facility];  Service: Endoscopy;  Laterality: N/A;  . ESOPHAGOGASTRODUODENOSCOPY N/A 01/17/2013   Procedure: ESOPHAGOGASTRODUODENOSCOPY (EGD);  Surgeon: Inda Castle, MD;  Location: [facility];  Service: Endoscopy;  Laterality: N/A;  . EXTERNAL EAR SURGERY  01/2016   BCC removal  . EYE SURGERY  infant   "lazy eye"  . FLEXIBLE SIGMOIDOSCOPY  02/09/2012   Procedure: FLEXIBLE SIGMOIDOSCOPY;  Surgeon: Inda Castle, MD;  Location: [facility];  Service: Endoscopy;  Laterality:  N/A;  . HEMORRHOID BANDING  02/09/2012   Procedure: HEMORRHOID BANDING;  Surgeon: Inda Castle, MD;  Location: [facility];  Service: Endoscopy;  Laterality: N/A;  . HOT HEMOSTASIS N/A 01/17/2013   Procedure: HOT HEMOSTASIS (ARGON PLASMA COAGULATION/BICAP);  Surgeon: Inda Castle, MD;  Location: [facility];  Service: Endoscopy;  Laterality: N/A;  . KIDNEY STONE SURGERY    . PARATHYROIDECTOMY  04/2008  . TONSILLECTOMY      Current Outpatient Medications  Medication Sig Dispense Refill  . apixaban (ELIQUIS) 5 MG TABS tablet Take 1 tablet (5 mg total) by mouth 2 (two) times daily. 180 tablet 3  . benazepril (LOTENSIN) 20 MG tablet Take 1 tablet (20 mg total) by mouth daily. 90 tablet 3  . cholecalciferol (VITAMIN D) 1000 UNITS tablet Take 1,000 Units by mouth daily.    . flecainide (TAMBOCOR) 100 MG tablet Take 1 tablet (100 mg total) by mouth 2 (two) times daily. 180 tablet 3  . metoprolol succinate (TOPROL-XL) 50 MG 24 hr tablet Take 1 tablet (50 mg total) by mouth daily. 90 tablet 3  . NIFEdipine (PROCARDIA XL/ADALAT-CC) 60 MG 24 hr tablet Take 1 tablet (60 mg total) by mouth daily. 90 tablet 1  . omeprazole (PRILOSEC) 20 MG capsule Take 1 capsule (20 mg total) by mouth daily. 90 capsule 3  . Saw Palmetto, Serenoa repens, (SAW PALMETTO PO) Take 400  mg by mouth daily.    . simvastatin (ZOCOR) 40 MG tablet Take 1 tablet (40 mg total) by mouth at bedtime. 90 tablet 3   No current facility-administered medications for this encounter.     Allergies  Allergen Reactions  . Cucumber Extract Nausea And Vomiting  . Sulfonamide Derivatives Other (See Comments)    "don't know reaction" per pt    Social History   Socioeconomic History  . Marital status: Widowed    Spouse name: Not on file  . Number of children: 3  . Years of education: Not on file  . Highest education level: Not on file  Social Needs  . Financial resource strain: Not on file  . Food insecurity - worry: Not on file   . Food insecurity - inability: Not on file  . Transportation needs - medical: Not on file  . Transportation needs - non-medical: Not on file  Occupational History  . Occupation: retired   Tobacco Use  . Smoking status: Former Smoker    Years: 25.00    Types: Cigarettes    Last attempt to quit: 11/30/1981    Years since quitting: 35.4  . Smokeless tobacco: Never Used  Substance and Sexual Activity  . Alcohol use: Yes    Alcohol/week: 4.2 oz    Types: 7 Glasses of wine per week    Comment: 1 glass of wine or beer with dinner  . Drug use: No  . Sexual activity: Not on file  Other Topics Concern  . Not on file  Social History Narrative   Retired, single, has  a girlfriend , 3 children  (one lives in town)   Daughter has a copy of his living will    Family History  Problem Relation Age of Onset  . Heart attack Father 46  . Heart attack Mother   . Colon cancer Neg Hx   . Prostate cancer Neg Hx   . Diabetes Neg Hx     ROS- All systems are reviewed and negative except as per the HPI above  Physical Exam: Vitals:   04/27/17 1114  BP: 132/80  Pulse: (!) 110  Weight: 206 lb (93.4 kg)  Height: 5\' 7"  (1.702 m)   Wt Readings from Last 3 Encounters:  04/27/17 206 lb (93.4 kg)  04/06/17 208 lb 12.8 oz (94.7 kg)  03/20/17 205 lb 6 oz (93.2 kg)    Labs: Lab Results  Component Value Date   NA 142 02/12/2017   K 4.2 02/12/2017   CL 104 02/12/2017   CO2 30 02/12/2017   GLUCOSE 112 (H) 02/12/2017   BUN 26 (H) 02/12/2017   CREATININE 1.33 02/12/2017   CALCIUM 8.8 02/12/2017   Lab Results  Component Value Date   INR 1.0 05/30/2008   Lab Results  Component Value Date   CHOL 131 02/12/2017   HDL 38.70 (L) 02/12/2017   LDLCALC 71 02/12/2017   TRIG 104.0 02/12/2017     GEN- The patient is well appearing, alert and oriented x 3 today.   Head- normocephalic, atraumatic Eyes-  Sclera clear, conjunctiva pink Ears- hearing intact Oropharynx- clear Neck- supple, no  JVP Lymph- no cervical lymphadenopathy Lungs- Clear to ausculation bilaterally, normal work of breathing Heart-irregular rate and rhythm, no murmurs, rubs or gallops, PMI not laterally displaced GI- soft, NT, ND, + BS Extremities- no clubbing, cyanosis, or edema MS- no significant deformity or atrophy Skin- no rash or lesion Psych- euthymic mood, full affect Neuro- strength and sensation are  intact  EKG- atrial flutter at 110 bpm, QRS 108 ms, qtc 573 ms, IRBBB, marked ST abnormality, Prolonged qtc at 573 bpm EKG reviewed by Dr. Caryl Comes Dr Olin Pia comment on echo/flecainde Notes recorded by Deboraha Sprang, MD on 04/22/2017 at 10:29 AM EST Please Inform Patient Echo showed Significant worsening heart muscle function  Lets try flecainide 100 bid and DCCV after about a week   Based on  [place] paper on the use of flecainide in pts with rhythm related cardiomypathy)  ECHO-Study Conclusions  - Left ventricle: The cavity size was normal. There was moderate   concentric hypertrophy. Systolic function was severely reduced.   The estimated ejection fraction was in the range of 25% to 30%.   Diffuse hypokinesis. The study was not technically sufficient to   allow evaluation of LV diastolic dysfunction due to atrial   fibrillation. - Aortic valve: Trileaflet; mildly thickened, mildly calcified   leaflets. There was no regurgitation. - Mitral valve: There was mild regurgitation. - Left atrium: The atrium was moderately dilated. - Right ventricle: The cavity size was normal. Wall thickness was   normal. Systolic function was normal. - Right atrium: The atrium was mildly dilated. - Tricuspid valve: There was no regurgitation. - Pulmonary arteries: Systolic pressure was at the upper limits of   normal. - Inferior vena cava: The vessel was normal in size. - Pericardium, extracardiac: There was no pericardial effusion.  Impressions:  - LVEF is severely depressed estimated at 25-30%  with diffuse   hypokinesis.   LVEF is most probably underestimated by tachycardia during   acquisition, repeat study once HR is controlled is recommended.    Assessment and Plan: 1.Persisitent afib Now on flecainide 100 mg bid with some nausea Reviewed EKG changes with Dr. Caryl Comes as well as nausea and he suggested to decrease flecainide to  50 mg bid and go ahead with cardioversion He denies any missed doses of eliquis 5 mg bid Chadsvasc score of at least 5 Procedure described to pt, risk vrs benefit Bmet/cbc    F/u one week in afib clinic March 8th with Tamala Bari, NP  Geroge Baseman. Marielouise Amey, [facility] [address] [place], [place] [address] [phone]

## 2017-04-28 ENCOUNTER — Encounter (HOSPITAL_COMMUNITY): Payer: Self-pay | Admitting: Nurse Practitioner

## 2017-04-29 ENCOUNTER — Encounter (HOSPITAL_COMMUNITY): Payer: Self-pay | Admitting: Nurse Practitioner

## 2017-05-01 ENCOUNTER — Encounter (HOSPITAL_COMMUNITY): Payer: Self-pay | Admitting: Nurse Practitioner

## 2017-05-01 ENCOUNTER — Other Ambulatory Visit: Payer: Self-pay

## 2017-05-01 ENCOUNTER — Encounter (HOSPITAL_COMMUNITY)
Admission: RE | Disposition: A | Discharge status: Home / Self Care | Payer: Self-pay | Source: Ambulatory Visit | Attending: Cardiology

## 2017-05-01 ENCOUNTER — Ambulatory Visit (HOSPITAL_COMMUNITY)
Admission: RE | Admit: 2017-05-01 | Discharge: 2017-05-01 | Disposition: A | Discharge status: Home / Self Care | Payer: PPO | Source: Ambulatory Visit | Attending: Nurse Practitioner | Admitting: Nurse Practitioner

## 2017-05-01 ENCOUNTER — Ambulatory Visit (HOSPITAL_COMMUNITY)
Admission: RE | Admit: 2017-05-01 | Discharge: 2017-05-01 | Disposition: A | Discharge status: Home / Self Care | Payer: PPO | Source: Ambulatory Visit | Attending: Cardiology | Admitting: Cardiology

## 2017-05-01 DIAGNOSIS — Z882 Allergy status to sulfonamides status: Secondary | ICD-10-CM | POA: Insufficient documentation

## 2017-05-01 DIAGNOSIS — Z79899 Other long term (current) drug therapy: Secondary | ICD-10-CM | POA: Insufficient documentation

## 2017-05-01 DIAGNOSIS — Z538 Procedure and treatment not carried out for other reasons: Secondary | ICD-10-CM | POA: Diagnosis not present

## 2017-05-01 DIAGNOSIS — Z8249 Family history of ischemic heart disease and other diseases of the circulatory system: Secondary | ICD-10-CM | POA: Diagnosis not present

## 2017-05-01 DIAGNOSIS — I481 Persistent atrial fibrillation: Secondary | ICD-10-CM | POA: Insufficient documentation

## 2017-05-01 DIAGNOSIS — R7303 Prediabetes: Secondary | ICD-10-CM | POA: Insufficient documentation

## 2017-05-01 DIAGNOSIS — E785 Hyperlipidemia, unspecified: Secondary | ICD-10-CM | POA: Insufficient documentation

## 2017-05-01 DIAGNOSIS — Z7901 Long term (current) use of anticoagulants: Secondary | ICD-10-CM | POA: Diagnosis not present

## 2017-05-01 DIAGNOSIS — Z8719 Personal history of other diseases of the digestive system: Secondary | ICD-10-CM | POA: Diagnosis not present

## 2017-05-01 DIAGNOSIS — K219 Gastro-esophageal reflux disease without esophagitis: Secondary | ICD-10-CM | POA: Diagnosis not present

## 2017-05-01 DIAGNOSIS — Z87891 Personal history of nicotine dependence: Secondary | ICD-10-CM | POA: Insufficient documentation

## 2017-05-01 DIAGNOSIS — I10 Essential (primary) hypertension: Secondary | ICD-10-CM | POA: Insufficient documentation

## 2017-05-01 LAB — CBC
HCT: 46.9 % (ref 39.0–52.0)
HEMOGLOBIN: 15.5 g/dL (ref 13.0–17.0)
MCH: 28.7 pg (ref 26.0–34.0)
MCHC: 33 g/dL (ref 30.0–36.0)
MCV: 86.7 fL (ref 78.0–100.0)
PLATELETS: 233 10*3/uL (ref 150–400)
RBC: 5.41 MIL/uL (ref 4.22–5.81)
RDW: 15 % (ref 11.5–15.5)
WBC: 8.8 10*3/uL (ref 4.0–10.5)

## 2017-05-01 LAB — BASIC METABOLIC PANEL
ANION GAP: 11 (ref 5–15)
BUN: 29 mg/dL — AB (ref 6–20)
CHLORIDE: 105 mmol/L (ref 101–111)
CO2: 25 mmol/L (ref 22–32)
Calcium: 8.9 mg/dL (ref 8.9–10.3)
Creatinine, Ser: 1.44 mg/dL — ABNORMAL HIGH (ref 0.61–1.24)
GFR calc Af Amer: 51 mL/min — ABNORMAL LOW (ref >60)
GFR, EST NON AFRICAN AMERICAN: 44 mL/min — AB (ref >60)
GLUCOSE: 118 mg/dL — AB (ref 65–99)
POTASSIUM: 4.1 mmol/L (ref 3.5–5.1)
Sodium: 141 mmol/L (ref 135–145)

## 2017-05-01 SURGERY — CANCELLED PROCEDURE

## 2017-05-01 NOTE — H&P (Addendum)
ATRIAL FIB OFFICE VISIT   04/27/2017 [place] [facility]    Sherran Needs, NP  Cardiology   Atrial flutter, unspecified type [facility])  Dx   Atrial Fibrillation ; Referred by Colon Branch, MD  Reason for Visit   Additional Documentation   Vitals:   BP 132/80 (BP Location: Right Arm, Patient Position: Sitting, Cuff Size: Normal)   Pulse 110 Abnormal     Ht 5\' 7"  (1.702 m)   Wt 206 lb (93.4 kg)   BMI 32.26 kg/m   BSA 2.1 m      More Vitals   Flowsheets:   Anthropometrics,   MEWS Score     Encounter Info:   Billing Info,   History,   Allergies,   Detailed Report     All Notes   Progress Notes by Sherran Needs, NP at 04/27/2017 12:34 PM   Author: Sherran Needs, NP Author Type: Nurse Practitioner Filed: 04/27/2017 12:54 PM  Note Status: Signed Cosign: Cosign Not Required Date of Service: 04/27/2017 12:34 PM  Editor: Sherran Needs, NP (Nurse Practitioner)  Expand All Collapse All     Primary Care Physician: Colon Branch, MD Referring Physician: Dr. Vonita Moss Ronald [name] is a 83 y.o. male with a h/o persistent  afib that is in the afib clinic today for f/u  initiation of flecainide 100 mg bid, started 2/14. He has noted some nausea since starting the drug. Taking with food or without does not seem to make a difference. EKG shows that he is in atrial flutter today at 110 bpm.   Today, he denies symptoms of palpitations, chest pain, shortness of breath, orthopnea, PND, lower extremity edema, dizziness, presyncope, syncope, or neurologic sequela. The patient is tolerating medications without difficulties and is otherwise without complaint today.       Past Medical History:  Diagnosis Date  . BPH (benign prostatic hypertrophy)    f/u @ Biopsy, s/p bx 2009 apro (-)  . Calculus of kidney    s/p R percutaneus nephrostomy @ [facility]  . Cataract   . Colon polyp   . Complication of anesthesia    violent upon waking up  . Diabetes  mellitus    pre diabetic  . Diverticulosis   . Edema leg    asymetric , L > R , [place] neg for DVT 09-2010  . GERD (gastroesophageal reflux disease)   . Hemorrhoids   . Hypercalcemia    parathyroidectomy 04/2008  . Hyperlipidemia   . Hypertension   . Iron deficiency anemia         Past Surgical History:  Procedure Laterality Date  . APPENDECTOMY    . CATARACT EXTRACTION Right   . COLONOSCOPY  12/16/2011   Procedure: COLONOSCOPY;  Surgeon: Inda Castle, MD;  Location: [facility];  Service: Endoscopy;  Laterality: N/A;  . ESOPHAGOGASTRODUODENOSCOPY N/A 01/17/2013   Procedure: ESOPHAGOGASTRODUODENOSCOPY (EGD);  Surgeon: Inda Castle, MD;  Location: [facility];  Service: Endoscopy;  Laterality: N/A;  . EXTERNAL EAR SURGERY  01/2016   BCC removal  . EYE SURGERY  infant   "lazy eye"  . FLEXIBLE SIGMOIDOSCOPY  02/09/2012   Procedure: FLEXIBLE SIGMOIDOSCOPY;  Surgeon: Inda Castle, MD;  Location: [facility];  Service: Endoscopy;  Laterality: N/A;  . HEMORRHOID BANDING  02/09/2012   Procedure: HEMORRHOID BANDING;  Surgeon: Inda Castle, MD;  Location: [facility];  Service: Endoscopy;  Laterality: N/A;  . HOT HEMOSTASIS N/A  01/17/2013   Procedure: HOT HEMOSTASIS (ARGON PLASMA COAGULATION/BICAP);  Surgeon: Inda Castle, MD;  Location: [facility];  Service: Endoscopy;  Laterality: N/A;  . KIDNEY STONE SURGERY    . PARATHYROIDECTOMY  04/2008  . TONSILLECTOMY            Current Outpatient Medications  Medication Sig Dispense Refill  . apixaban (ELIQUIS) 5 MG TABS tablet Take 1 tablet (5 mg total) by mouth 2 (two) times daily. 180 tablet 3  . benazepril (LOTENSIN) 20 MG tablet Take 1 tablet (20 mg total) by mouth daily. 90 tablet 3  . cholecalciferol (VITAMIN D) 1000 UNITS tablet Take 1,000 Units by mouth daily.    . flecainide (TAMBOCOR) 100 MG tablet Take 1 tablet (100 mg total) by mouth 2 (two) times daily. 180 tablet 3  . metoprolol  succinate (TOPROL-XL) 50 MG 24 hr tablet Take 1 tablet (50 mg total) by mouth daily. 90 tablet 3  . NIFEdipine (PROCARDIA XL/ADALAT-CC) 60 MG 24 hr tablet Take 1 tablet (60 mg total) by mouth daily. 90 tablet 1  . omeprazole (PRILOSEC) 20 MG capsule Take 1 capsule (20 mg total) by mouth daily. 90 capsule 3  . Saw Palmetto, Serenoa repens, (SAW PALMETTO PO) Take 400 mg by mouth daily.    . simvastatin (ZOCOR) 40 MG tablet Take 1 tablet (40 mg total) by mouth at bedtime. 90 tablet 3   No current facility-administered medications for this encounter.          Allergies  Allergen Reactions  . Cucumber Extract Nausea And Vomiting  . Sulfonamide Derivatives Other (See Comments)    "don't know reaction" per pt    Social History        Socioeconomic History  . Marital status: Widowed    Spouse name: Not on file  . Number of children: 3  . Years of education: Not on file  . Highest education level: Not on file  Social Needs  . Financial resource strain: Not on file  . Food insecurity - worry: Not on file  . Food insecurity - inability: Not on file  . Transportation needs - medical: Not on file  . Transportation needs - non-medical: Not on file  Occupational History  . Occupation: retired   Tobacco Use  . Smoking status: Former Smoker    Years: 25.00    Types: Cigarettes    Last attempt to quit: 11/30/1981    Years since quitting: 35.4  . Smokeless tobacco: Never Used  Substance and Sexual Activity  . Alcohol use: Yes    Alcohol/week: 4.2 oz    Types: 7 Glasses of wine per week    Comment: 1 glass of wine or beer with dinner  . Drug use: No  . Sexual activity: Not on file  Other Topics Concern  . Not on file  Social History Narrative   Retired, single, has  a girlfriend , 3 children  (one lives in town)   Daughter has a copy of his living will         Family History  Problem Relation Age of Onset  . Heart attack Father 25  . Heart attack  Mother   . Colon cancer Neg Hx   . Prostate cancer Neg Hx   . Diabetes Neg Hx     ROS- All systems are reviewed and negative except as per the HPI above  Physical Exam:    Vitals:   04/27/17 1114  BP: 132/80  Pulse: (!) 110  Weight: 206 lb (93.4 kg)  Height: 5\' 7"  (1.702 m)      Wt Readings from Last 3 Encounters:  04/27/17 206 lb (93.4 kg)  04/06/17 208 lb 12.8 oz (94.7 kg)  03/20/17 205 lb 6 oz (93.2 kg)    Labs: Recent Labs       Lab Results  Component Value Date   NA 142 02/12/2017   K 4.2 02/12/2017   CL 104 02/12/2017   CO2 30 02/12/2017   GLUCOSE 112 (H) 02/12/2017   BUN 26 (H) 02/12/2017   CREATININE 1.33 02/12/2017   CALCIUM 8.8 02/12/2017     Recent Labs       Lab Results  Component Value Date   INR 1.0 05/30/2008     Recent Labs       Lab Results  Component Value Date   CHOL 131 02/12/2017   HDL 38.70 (L) 02/12/2017   LDLCALC 71 02/12/2017   TRIG 104.0 02/12/2017       GEN- The patient is well appearing, alert and oriented x 3 today.   Head- normocephalic, atraumatic Eyes-  Sclera clear, conjunctiva pink Ears- hearing intact Oropharynx- clear Neck- supple, no JVP Lymph- no cervical lymphadenopathy Lungs- Clear to ausculation bilaterally, normal work of breathing Heart-irregular rate and rhythm, no murmurs, rubs or gallops, PMI not laterally displaced GI- soft, NT, ND, + BS Extremities- no clubbing, cyanosis, or edema MS- no significant deformity or atrophy Skin- no rash or lesion Psych- euthymic mood, full affect Neuro- strength and sensation are intact  EKG- atrial flutter at 110 bpm, QRS 108 ms, qtc 573 ms, IRBBB, marked ST abnormality, Prolonged qtc at 573 bpm EKG reviewed by Dr. Caryl Comes Dr Olin Pia comment on echo/flecainde Notes recorded by Deboraha Sprang, MD on 04/22/2017 at 10:29 AM EST Please Inform Patient Echo showed Significant worsening heart muscle function  Lets try flecainide 100 bid  and DCCV after about a week   Based on  [place] paper on the use of flecainide in pts with rhythm related cardiomypathy)  ECHO-Study Conclusions  - Left ventricle: The cavity size was normal. There was moderate concentric hypertrophy. Systolic function was severely reduced. The estimated ejection fraction was in the range of 25% to 30%. Diffuse hypokinesis. The study was not technically sufficient to allow evaluation of LV diastolic dysfunction due to atrial fibrillation. - Aortic valve: Trileaflet; mildly thickened, mildly calcified leaflets. There was no regurgitation. - Mitral valve: There was mild regurgitation. - Left atrium: The atrium was moderately dilated. - Right ventricle: The cavity size was normal. Wall thickness was normal. Systolic function was normal. - Right atrium: The atrium was mildly dilated. - Tricuspid valve: There was no regurgitation. - Pulmonary arteries: Systolic pressure was at the upper limits of normal. - Inferior vena cava: The vessel was normal in size. - Pericardium, extracardiac: There was no pericardial effusion.  Impressions:  - LVEF is severely depressed estimated at 25-30% with diffuse hypokinesis. LVEF is most probably underestimated by tachycardia during acquisition, repeat study once HR is controlled is recommended.    Assessment and Plan: 1.Persisitent afib Now on flecainide 100 mg bid with some nausea Reviewed EKG changes with Dr. Caryl Comes as well as nausea and he suggested to decrease flecainide to  50 mg bid and go ahead with cardioversion He denies any missed doses of eliquis 5 mg bid Chadsvasc score of at least 5 Procedure described to pt, risk vrs benefit Bmet/cbc    F/u one week in afib clinic March  8th with Tamala Bari, NP  Geroge Baseman [place], [facility] [address] [place], [place] [address] [phone]      Pt presented for DCCV today and noted to  be in sinus; procedure cancelled; continue preadmission meds; fu in atrial fibrillation clinic and with Dr Caryl Comes.  Kirk Ruths

## 2017-05-01 NOTE — Progress Notes (Signed)
Patient in [place] upon arrival to endo for cardioversion.  EKG performed for verification.  Dr. Stanford Breed aware  Vista Lawman, RN

## 2017-05-05 ENCOUNTER — Ambulatory Visit (HOSPITAL_COMMUNITY)
Admission: RE | Admit: 2017-05-05 | Discharge: 2017-05-05 | Disposition: A | Discharge status: Home / Self Care | Payer: PPO | Source: Ambulatory Visit | Attending: Nurse Practitioner | Admitting: Nurse Practitioner

## 2017-05-05 ENCOUNTER — Encounter (HOSPITAL_COMMUNITY): Payer: Self-pay | Admitting: Nurse Practitioner

## 2017-05-05 VITALS — BP 132/74 | HR 85 | Ht 67.0 in | Wt 209.0 lb

## 2017-05-05 DIAGNOSIS — K219 Gastro-esophageal reflux disease without esophagitis: Secondary | ICD-10-CM | POA: Insufficient documentation

## 2017-05-05 DIAGNOSIS — I4891 Unspecified atrial fibrillation: Secondary | ICD-10-CM

## 2017-05-05 DIAGNOSIS — Z7901 Long term (current) use of anticoagulants: Secondary | ICD-10-CM | POA: Diagnosis not present

## 2017-05-05 DIAGNOSIS — I10 Essential (primary) hypertension: Secondary | ICD-10-CM | POA: Diagnosis not present

## 2017-05-05 DIAGNOSIS — I481 Persistent atrial fibrillation: Secondary | ICD-10-CM | POA: Insufficient documentation

## 2017-05-05 DIAGNOSIS — Z9889 Other specified postprocedural states: Secondary | ICD-10-CM | POA: Diagnosis not present

## 2017-05-05 DIAGNOSIS — E119 Type 2 diabetes mellitus without complications: Secondary | ICD-10-CM | POA: Insufficient documentation

## 2017-05-05 DIAGNOSIS — R000 Tachycardia, unspecified: Secondary | ICD-10-CM | POA: Insufficient documentation

## 2017-05-05 DIAGNOSIS — I429 Cardiomyopathy, unspecified: Secondary | ICD-10-CM | POA: Diagnosis not present

## 2017-05-05 DIAGNOSIS — Z79899 Other long term (current) drug therapy: Secondary | ICD-10-CM | POA: Diagnosis not present

## 2017-05-05 DIAGNOSIS — Z87891 Personal history of nicotine dependence: Secondary | ICD-10-CM | POA: Insufficient documentation

## 2017-05-05 NOTE — Progress Notes (Signed)
Primary Care Physician: Colon Branch, MD Referring Physician: Dr. Vonita Moss Ronald [name] is a 83 y.o. male with a h/o persistent  afib that is in the afib clinic today for f/u initiation of flecainide 100 mg bid, started 2/14. He had nausea with this dose and the dose was reduced to 50 mg bid and he was cardioverted 2/22.  He is in the clinic for f/u. The nausea resolved with the reduction of the dose to 50 mg bid. However, he feels no different in SR than in afib. States he feels fine either way.   Today, he denies symptoms of palpitations, chest pain, shortness of breath, orthopnea, PND, lower extremity edema, dizziness, presyncope, syncope, or neurologic sequela. The patient is tolerating medications without difficulties and is otherwise without complaint today.   Past Medical History:  Diagnosis Date  . BPH (benign prostatic hypertrophy)    f/u @ Biopsy, s/p bx 2009 apro (-)  . Calculus of kidney    s/p R percutaneus nephrostomy @ [facility]  . Cataract   . Colon polyp   . Complication of anesthesia    violent upon waking up  . Diabetes mellitus    pre diabetic  . Diverticulosis   . Edema leg    asymetric , L > R , [place] neg for DVT 09-2010  . GERD (gastroesophageal reflux disease)   . Hemorrhoids   . Hypercalcemia    parathyroidectomy 04/2008  . Hyperlipidemia   . Hypertension   . Iron deficiency anemia    Past Surgical History:  Procedure Laterality Date  . APPENDECTOMY    . CATARACT EXTRACTION Right   . COLONOSCOPY  12/16/2011   Procedure: COLONOSCOPY;  Surgeon: Inda Castle, MD;  Location: [facility];  Service: Endoscopy;  Laterality: N/A;  . ESOPHAGOGASTRODUODENOSCOPY N/A 01/17/2013   Procedure: ESOPHAGOGASTRODUODENOSCOPY (EGD);  Surgeon: Inda Castle, MD;  Location: [facility];  Service: Endoscopy;  Laterality: N/A;  . EXTERNAL EAR SURGERY  01/2016   BCC removal  . EYE SURGERY  infant   "lazy eye"  . FLEXIBLE SIGMOIDOSCOPY  02/09/2012   Procedure: FLEXIBLE  SIGMOIDOSCOPY;  Surgeon: Inda Castle, MD;  Location: [facility];  Service: Endoscopy;  Laterality: N/A;  . HEMORRHOID BANDING  02/09/2012   Procedure: HEMORRHOID BANDING;  Surgeon: Inda Castle, MD;  [facility]: [facility];  Service: Endoscopy;  Laterality: N/A;  . HOT HEMOSTASIS N/A 01/17/2013   Procedure: HOT HEMOSTASIS (ARGON PLASMA COAGULATION/BICAP);  Surgeon: Inda Castle, MD;  Location: [facility];  Service: Endoscopy;  Laterality: N/A;  . KIDNEY STONE SURGERY    . PARATHYROIDECTOMY  04/2008  . TONSILLECTOMY      Current Outpatient Medications  Medication Sig Dispense Refill  . apixaban (ELIQUIS) 5 MG TABS tablet Take 1 tablet (5 mg total) by mouth 2 (two) times daily. 180 tablet 3  . benazepril (LOTENSIN) 20 MG tablet Take 1 tablet (20 mg total) by mouth daily. 90 tablet 3  . cholecalciferol (VITAMIN D) 1000 UNITS tablet Take 1,000 Units by mouth daily.    . flecainide (TAMBOCOR) 100 MG tablet Take 1 tablet (100 mg total) by mouth 2 (two) times daily. (Patient taking differently: Take 50 mg by mouth 2 (two) times daily. ) 180 tablet 3  . metoprolol succinate (TOPROL-XL) 50 MG 24 hr tablet Take 1 tablet (50 mg total) by mouth daily. 90 tablet 3  . Multiple Vitamins-Minerals (PRESERVISION AREDS 2) CAPS Take 1 capsule by mouth daily.    Marland Kitchen  NIFEdipine (PROCARDIA XL/ADALAT-CC) 60 MG 24 hr tablet Take 1 tablet (60 mg total) by mouth daily. 90 tablet 1  . omeprazole (PRILOSEC) 20 MG capsule Take 1 capsule (20 mg total) by mouth daily. 90 capsule 3  . Saw Palmetto, Serenoa repens, (SAW PALMETTO PO) Take 540 mg by mouth daily.     . simvastatin (ZOCOR) 40 MG tablet Take 1 tablet (40 mg total) by mouth at bedtime. 90 tablet 3   No current facility-administered medications for this encounter.     Allergies  Allergen Reactions  . Cucumber Extract Nausea And Vomiting  . Sulfonamide Derivatives Other (See Comments)    "don't know reaction" per pt    Social History    Socioeconomic History  . Marital status: Widowed    Spouse name: Not on file  . Number of children: 3  . Years of education: Not on file  . Highest education level: Not on file  Social Needs  . Financial resource strain: Not on file  . Food insecurity - worry: Not on file  . Food insecurity - inability: Not on file  . Transportation needs - medical: Not on file  . Transportation needs - non-medical: Not on file  Occupational History  . Occupation: retired   Tobacco Use  . Smoking status: Former Smoker    Years: 25.00    Types: Cigarettes    Last attempt to quit: 11/30/1981    Years since quitting: 35.4  . Smokeless tobacco: Never Used  Substance and Sexual Activity  . Alcohol use: Yes    Alcohol/week: 4.2 oz    Types: 7 Glasses of wine per week    Comment: 1 glass of wine or beer with dinner  . Drug use: No  . Sexual activity: Not on file  Other Topics Concern  . Not on file  Social History Narrative   Retired, single, has  a girlfriend , 3 children  (one lives in town)   Daughter has a copy of his living will    Family History  Problem Relation Age of Onset  . Heart attack Father 79  . Heart attack Mother   . Colon cancer Neg Hx   . Prostate cancer Neg Hx   . Diabetes Neg Hx     ROS- All systems are reviewed and negative except as per the HPI above  Physical Exam: Vitals:   05/05/17 0953  BP: 132/74  Pulse: 85  Weight: 209 lb (94.8 kg)  Height: 5\' 7"  (1.702 m)   Wt Readings from Last 3 Encounters:  05/05/17 209 lb (94.8 kg)  04/27/17 206 lb (93.4 kg)  04/06/17 208 lb 12.8 oz (94.7 kg)    Labs: Lab Results  Component Value Date   NA 141 05/01/2017   K 4.1 05/01/2017   CL 105 05/01/2017   CO2 25 05/01/2017   GLUCOSE 118 (H) 05/01/2017   BUN 29 (H) 05/01/2017   CREATININE 1.44 (H) 05/01/2017   CALCIUM 8.9 05/01/2017   Lab Results  Component Value Date   INR 1.0 05/30/2008   Lab Results  Component Value Date   CHOL 131 02/12/2017   HDL  38.70 (L) 02/12/2017   LDLCALC 71 02/12/2017   TRIG 104.0 02/12/2017     GEN- The patient is well appearing, alert and oriented x 3 today.   Head- normocephalic, atraumatic Eyes-  Sclera clear, conjunctiva pink Ears- hearing intact Oropharynx- clear Neck- supple, no JVP Lymph- no cervical lymphadenopathy Lungs- Clear to ausculation bilaterally, normal  work of breathing Heart-irregular rate and rhythm, no murmurs, rubs or gallops, PMI not laterally displaced GI- soft, NT, ND, + BS Extremities- no clubbing, cyanosis, or edema MS- no significant deformity or atrophy Skin- no rash or lesion Psych- euthymic mood, full affect Neuro- strength and sensation are intact  EKG- Sinus rhythm at 85 bpm, with PAC's IRBBB, LAFB, prolonged qtc at 521 ms Dr Olin Pia comment on echo/flecainde Notes recorded by Deboraha Sprang, MD on 04/22/2017 at 10:29 AM EST Please Inform Patient Echo showed Significant worsening heart muscle function  Lets try flecainide 100 bid and DCCV after about a week  Based on  [place] paper on the use of flecainide in pts with rhythm related cardiomypathy)  ECHO-Study Conclusions  - Left ventricle: The cavity size was normal. There was moderate   concentric hypertrophy. Systolic function was severely reduced.   The estimated ejection fraction was in the range of 25% to 30%.   Diffuse hypokinesis. The study was not technically sufficient to   allow evaluation of LV diastolic dysfunction due to atrial   fibrillation. - Aortic valve: Trileaflet; mildly thickened, mildly calcified   leaflets. There was no regurgitation. - Mitral valve: There was mild regurgitation. - Left atrium: The atrium was moderately dilated. - Right ventricle: The cavity size was normal. Wall thickness was   normal. Systolic function was normal. - Right atrium: The atrium was mildly dilated. - Tricuspid valve: There was no regurgitation. - Pulmonary arteries: Systolic pressure was at the  upper limits of   normal. - Inferior vena cava: The vessel was normal in size. - Pericardium, extracardiac: There was no pericardial effusion.  Impressions:  - LVEF is severely depressed estimated at 25-30% with diffuse   hypokinesis.   LVEF is most probably underestimated by tachycardia during   acquisition, repeat study once HR is controlled is recommended.    Assessment and Plan: 1.Persisitent afib with tachycardia mediated cardiomyopathy  Successful cardioversion, pt sees no difference in how he feels Now on flecainide 50 mg bid without  nausea Will review EKG changes on flecainide  with Dr. Caryl Comes Continue eliquis 5 mg bid Chadsvasc score of at least 5   F/u March 8th with Tamala Bari, NP  Geroge Baseman. [place], [facility] [address] [place], [place] [address] [phone]

## 2017-05-13 ENCOUNTER — Ambulatory Visit (INDEPENDENT_AMBULATORY_CARE_PROVIDER_SITE_OTHER): Payer: PPO | Admitting: Internal Medicine

## 2017-05-13 ENCOUNTER — Encounter: Payer: Self-pay | Admitting: Internal Medicine

## 2017-05-13 VITALS — BP 126/72 | HR 58 | Temp 98.0°F | Resp 16 | Ht 67.0 in | Wt 205.2 lb

## 2017-05-13 DIAGNOSIS — I10 Essential (primary) hypertension: Secondary | ICD-10-CM

## 2017-05-13 DIAGNOSIS — R3121 Asymptomatic microscopic hematuria: Secondary | ICD-10-CM | POA: Diagnosis not present

## 2017-05-13 DIAGNOSIS — R972 Elevated prostate specific antigen [PSA]: Secondary | ICD-10-CM | POA: Diagnosis not present

## 2017-05-13 LAB — URINALYSIS, ROUTINE W REFLEX MICROSCOPIC
Bilirubin Urine: NEGATIVE
Nitrite: NEGATIVE
Specific Gravity, Urine: 1.025 (ref 1.000–1.030)
Total Protein, Urine: 30 — AB
URINE GLUCOSE: NEGATIVE
UROBILINOGEN UA: 0.2 (ref 0.0–1.0)
pH: 6.5 (ref 5.0–8.0)

## 2017-05-13 NOTE — Progress Notes (Signed)
Pre visit review using our clinic review tool, if applicable. No additional management support is needed unless otherwise documented below in the visit note. 

## 2017-05-13 NOTE — Progress Notes (Signed)
Subjective:    Patient ID: Ronald [name], male    DOB: [birth year], 83 y.o.   MRN: [id number]  DOS:  05/13/2017 Type of visit - description : rov Interval history: HTN: Good compliance with medication, ambulatory BPs within normal Increased PSA, microscopic hematuria: Had antibiotics, symptoms at baseline. Atrial fibrillation, cardiology notes reviewed.  Review of Systems Denies chest pain, difficulty breathing.  Edema at baseline.  No palpitations No dysuria, gross hematuria or urinary frequency.  Urinary stream is a slow, at baseline.  Past Medical History:  Diagnosis Date  . BPH (benign prostatic hypertrophy)    f/u @ Biopsy, s/p bx 2009 apro (-)  . Calculus of kidney    s/p R percutaneus nephrostomy @ [facility]  . Cataract   . Colon polyp   . Complication of anesthesia    violent upon waking up  . Diabetes mellitus    pre diabetic  . Diverticulosis   . Edema leg    asymetric , L > R , [place] neg for DVT 09-2010  . GERD (gastroesophageal reflux disease)   . Hemorrhoids   . Hypercalcemia    parathyroidectomy 04/2008  . Hyperlipidemia   . Hypertension   . Iron deficiency anemia     Past Surgical History:  Procedure Laterality Date  . APPENDECTOMY    . CATARACT EXTRACTION Right   . COLONOSCOPY  12/16/2011   Procedure: COLONOSCOPY;  Surgeon: Inda Castle, MD;  Location: [facility];  Service: Endoscopy;  Laterality: N/A;  . ESOPHAGOGASTRODUODENOSCOPY N/A 01/17/2013   Procedure: ESOPHAGOGASTRODUODENOSCOPY (EGD);  Surgeon: Inda Castle, MD;  Location: [facility];  Service: Endoscopy;  Laterality: N/A;  . EXTERNAL EAR SURGERY  01/2016   BCC removal  . EYE SURGERY  infant   "lazy eye"  . FLEXIBLE SIGMOIDOSCOPY  02/09/2012   Procedure: FLEXIBLE SIGMOIDOSCOPY;  Surgeon: Inda Castle, MD;  Location: [facility];  Service: Endoscopy;  Laterality: N/A;  . HEMORRHOID BANDING  02/09/2012   Procedure: HEMORRHOID BANDING;  Surgeon: Inda Castle, MD;  Location: [facility];  Service: Endoscopy;  Laterality: N/A;  . HOT HEMOSTASIS N/A 01/17/2013   Procedure: HOT HEMOSTASIS (ARGON PLASMA COAGULATION/BICAP);  Surgeon: Inda Castle, MD;  Location: [facility];  Service: Endoscopy;  Laterality: N/A;  . KIDNEY STONE SURGERY    . PARATHYROIDECTOMY  04/2008  . TONSILLECTOMY      Social History   Socioeconomic History  . Marital status: Widowed    Spouse name: Not on file  . Number of children: 3  . Years of education: Not on file  . Highest education level: Not on file  Social Needs  . Financial resource strain: Not on file  . Food insecurity - worry: Not on file  . Food insecurity - inability: Not on file  . Transportation needs - medical: Not on file  . Transportation needs - non-medical: Not on file  Occupational History  . Occupation: retired   Tobacco Use  . Smoking status: Former Smoker    Years: 25.00    Types: Cigarettes    Last attempt to quit: 11/30/1981    Years since quitting: 35.4  . Smokeless tobacco: Never Used  Substance and Sexual Activity  . Alcohol use: Yes    Alcohol/week: 4.2 oz    Types: 7 Glasses of wine per week    Comment: 1 glass of wine or beer with dinner  . Drug use: No  . Sexual activity: Not on file  Other Topics  Concern  . Not on file  Social History Narrative   Retired, single, has  a girlfriend , 3 children  (one lives in town)   Daughter has a copy of his living will      Allergies as of 05/13/2017      Reactions   Cucumber Extract Nausea And Vomiting   Sulfonamide Derivatives Other (See Comments)   "don't know reaction" per pt      Medication List        Accurate as of 05/13/17  4:40 PM. Always use your most recent med list.          apixaban 5 MG Tabs tablet Commonly known as:  ELIQUIS Take 1 tablet (5 mg total) by mouth 2 (two) times daily.   benazepril 20 MG tablet Commonly known as:  LOTENSIN Take 1 tablet (20 mg total) by mouth daily.   cholecalciferol 1000 units  tablet Commonly known as:  VITAMIN D Take 1,000 Units by mouth daily.   flecainide 100 MG tablet Commonly known as:  TAMBOCOR Take 1 tablet (100 mg total) by mouth 2 (two) times daily.   metoprolol succinate 50 MG 24 hr tablet Commonly known as:  TOPROL-XL Take 1 tablet (50 mg total) by mouth daily.   NIFEdipine 60 MG 24 hr tablet Commonly known as:  PROCARDIA XL/ADALAT-CC Take 1 tablet (60 mg total) by mouth daily.   omeprazole 20 MG capsule Commonly known as:  PRILOSEC Take 1 capsule (20 mg total) by mouth daily.   PRESERVISION AREDS 2 Caps Take 1 capsule by mouth daily.   SAW PALMETTO PO Take 540 mg by mouth daily.   simvastatin 40 MG tablet Commonly known as:  ZOCOR Take 1 tablet (40 mg total) by mouth at bedtime.          Objective:   Physical Exam BP 126/72 (BP Location: Left Arm, Patient Position: Sitting, Cuff Size: Small)   Pulse (!) 58   Temp 98 F (36.7 C) (Oral)   Resp 16   Ht 5\' 7"  (1.702 m)   Wt 205 lb 4 oz (93.1 kg)   BMI 32.15 kg/m  General:   Well developed, well nourished . NAD.  HEENT:  Normocephalic . Face symmetric, atraumatic.  Parotid glands: Symmetric, small, nontender Lungs:  CTA B Normal respiratory effort, no intercostal retractions, no accessory muscle use. Heart: RRR,  no murmur.  No pretibial edema bilaterally  Skin: Not pale. Not jaundice Neurologic:  alert & oriented X3.  Speech normal, gait appropriate for age and unassisted Psych--  Cognition and judgment appear intact.  Cooperative with normal attention span and concentration.  Behavior appropriate. No anxious or depressed appearing.      Assessment & Plan:  Assessment   DM HTN Hyperlipidemia Osteopenia T score -2.0 (2010), T score -1.6 (12-2014) LEG Edema,L>R , chronic, [place] neg DVT 2012 CV: Atrial tachycardia/flutter DX 02/2016 GU: --BPH  --ED --Elevated PSA, s/p multiple bx (last 2009?neg), last OV w/ urology 2014, last  PSA 2014 ~ 13, declines further  eval as off 02-2016 Hypercalcemia, parathyroidectomy 2010 Renal stones, nephrostomy 2009 at [facility] GI:  --Anemia: EGD 12-2012  (Bx: H pylory neg) [id number] gastric polyposis, Bx H Pylory +, was rec treatment Anemia felt to be d/t chronic bleed from  gastric polyp H Pylori + 11-2014, treated, f/u breath test (-)  --GERD --cscope 12-2011 >>> multiple hyperplastic  Polyps, tics  --12-2011 Banding internal hemorrhoids 4 --02-2012: Flex sig d/t discomfort  BCC , removed from  L ear   PLAN Parotitis: See last visit, resolved Atrial fibrillation: s/p cardioversion 05/01/2017, closely follow-up by cardiology, on NSR. BPH, elevated PSA: Last urine sample show microscopic hematuria, PSA elevated but not far from baseline. S/p abx, LUTS at baseline. We will recheck a UA, urine culture, if he has persisting microscopic hematuria, will consider refer to urology if the patient is willing to go HTN: Well-controlled with current meds.  Last BMP satisfactory.  No change RTC 6 months

## 2017-05-13 NOTE — Patient Instructions (Signed)
GO TO THE LAB : provide a urine sample    GO TO THE FRONT DESK Schedule your next appointment for a  Check up in 6 months    

## 2017-05-13 NOTE — Assessment & Plan Note (Signed)
Parotitis: See last visit, resolved Atrial fibrillation: s/p cardioversion 05/01/2017, closely follow-up by cardiology, on NSR. BPH, elevated PSA: Last urine sample show microscopic hematuria, PSA elevated but not far from baseline. S/p abx, LUTS at baseline. We will recheck a UA, urine culture, if he has persisting microscopic hematuria, will consider refer to urology if the patient is willing to go HTN: Well-controlled with current meds.  Last BMP satisfactory.  No change RTC 6 months

## 2017-05-14 LAB — URINE CULTURE
MICRO NUMBER: 90287856
SPECIMEN QUALITY:: ADEQUATE

## 2017-05-14 NOTE — Progress Notes (Signed)
Electrophysiology Office Note Date: 05/15/2017  ID:  Ronald [name], Ronald [name] [birth year], MRN [id number]  PCP: Colon Branch, MD Electrophysiologist: Caryl Comes  CC: AF follow up  Ronald [name] is a 83 y.o. male seen today for Dr Caryl Comes.  He presents today for routine electrophysiology followup.  Since last being seen in our clinic, the patient reports doing very well. He was placed on Flecainide for AF and presumed tachycardia mediated cardiomyopathy and converted to SR spontaneously 04/2017. He did not tolerate higher doses of Flecainide 2/2 nausea. At last visit with the AF clinic, he was unable to tell a functional difference between AF and SR.  He has been using his Jodelle Red monitor that was recommended to him by Dr Caryl Comes.  This has demonstrated initially atrial fibrillation but more recently SR (reviewed by me today). He denies chest pain, palpitations, dyspnea, PND, orthopnea, nausea, vomiting, dizziness, syncope, worsening edema, weight gain, or early satiety.  Past Medical History:  Diagnosis Date  . BPH (benign prostatic hypertrophy)    f/u @ Biopsy, s/p bx 2009 apro (-)  . Calculus of kidney    s/p R percutaneus nephrostomy @ [facility]  . Cataract   . Colon polyp   . Complication of anesthesia    violent upon waking up  . Diabetes mellitus    pre diabetic  . Diverticulosis   . Edema leg    asymetric , L > R , [place] neg for DVT 09-2010  . GERD (gastroesophageal reflux disease)   . Hemorrhoids   . Hypercalcemia    parathyroidectomy 04/2008  . Hyperlipidemia   . Hypertension   . Iron deficiency anemia    Past Surgical History:  Procedure Laterality Date  . APPENDECTOMY    . CATARACT EXTRACTION Right   . COLONOSCOPY  12/16/2011   Procedure: COLONOSCOPY;  Surgeon: Inda Castle, MD;  [facility];  Service: Endoscopy;  Laterality: N/A;  . ESOPHAGOGASTRODUODENOSCOPY N/A 01/17/2013   Procedure: ESOPHAGOGASTRODUODENOSCOPY (EGD);  Surgeon: Inda Castle, MD;  Location: [facility];  Service: Endoscopy;  Laterality: N/A;  . EXTERNAL EAR SURGERY  01/2016   BCC removal  . EYE SURGERY  infant   "lazy eye"  . FLEXIBLE SIGMOIDOSCOPY  02/09/2012   Procedure: FLEXIBLE SIGMOIDOSCOPY;  Surgeon: Inda Castle, MD;  Location: [facility];  Service: Endoscopy;  Laterality: N/A;  . HEMORRHOID BANDING  02/09/2012   Procedure: HEMORRHOID BANDING;  Surgeon: Inda Castle, MD;  Location: [facility];  Service: Endoscopy;  Laterality: N/A;  . HOT HEMOSTASIS N/A 01/17/2013   Procedure: HOT HEMOSTASIS (ARGON PLASMA COAGULATION/BICAP);  Surgeon: Inda Castle, MD;  Location: [facility];  Service: Endoscopy;  Laterality: N/A;  . KIDNEY STONE SURGERY    . PARATHYROIDECTOMY  04/2008  . TONSILLECTOMY      Current Outpatient Medications  Medication Sig Dispense Refill  . apixaban (ELIQUIS) 5 MG TABS tablet Take 1 tablet (5 mg total) by mouth 2 (two) times daily. 180 tablet 3  . benazepril (LOTENSIN) 20 MG tablet Take 1 tablet (20 mg total) by mouth daily. 90 tablet 3  . cholecalciferol (VITAMIN D) 1000 UNITS tablet Take 1,000 Units by mouth daily.    . flecainide (TAMBOCOR) 50 MG tablet Take 1.5 tablets (75 mg total) by mouth 2 (two) times daily. 90 tablet 1  . metoprolol succinate (TOPROL-XL) 50 MG 24 hr tablet Take 1 tablet (50 mg total) by mouth daily. 90 tablet 3  . Multiple Vitamins-Minerals (PRESERVISION  AREDS 2) CAPS Take 1 capsule by mouth daily.    Marland Kitchen NIFEdipine (PROCARDIA XL/ADALAT-CC) 60 MG 24 hr tablet Take 1 tablet (60 mg total) by mouth daily. 90 tablet 1  . omeprazole (PRILOSEC) 20 MG capsule Take 1 capsule (20 mg total) by mouth daily. 90 capsule 3  . Saw Palmetto, Serenoa repens, (SAW PALMETTO PO) Take 540 mg by mouth daily.     . simvastatin (ZOCOR) 40 MG tablet Take 1 tablet (40 mg total) by mouth at bedtime. 90 tablet 3   No current facility-administered medications for this visit.     Allergies:   Cucumber extract and Sulfonamide derivatives    Social History: Social History   Socioeconomic History  . Marital status: Widowed    Spouse name: Not on file  . Number of children: 3  . Years of education: Not on file  . Highest education level: Not on file  Social Needs  . Financial resource strain: Not on file  . Food insecurity - worry: Not on file  . Food insecurity - inability: Not on file  . Transportation needs - medical: Not on file  . Transportation needs - non-medical: Not on file  Occupational History  . Occupation: retired   Tobacco Use  . Smoking status: Former Smoker    Years: 25.00    Types: Cigarettes    Last attempt to quit: 11/30/1981    Years since quitting: 35.4  . Smokeless tobacco: Never Used  Substance and Sexual Activity  . Alcohol use: Yes    Alcohol/week: 4.2 oz    Types: 7 Glasses of wine per week    Comment: 1 glass of wine or beer with dinner  . Drug use: No  . Sexual activity: Not on file  Other Topics Concern  . Not on file  Social History Narrative   Retired, single, has  a girlfriend , 3 children  (one lives in town)   Daughter has a copy of his living will    Family History: Family History  Problem Relation Age of Onset  . Heart attack Father 68  . Heart attack Mother   . Colon cancer Neg Hx   . Prostate cancer Neg Hx   . Diabetes Neg Hx     Review of Systems: All other systems reviewed and are otherwise negative except as noted above.   Physical Exam: VS:  BP 122/66   Pulse 94   Ht 5' 7.5" (1.715 m)   Wt 208 lb (94.3 kg)   BMI 32.10 kg/m  , BMI Body mass index is 32.1 kg/m. Wt Readings from Last 3 Encounters:  05/15/17 208 lb (94.3 kg)  05/13/17 205 lb 4 oz (93.1 kg)  05/05/17 209 lb (94.8 kg)    GEN- The patient is elderly appearing, alert and oriented x 3 today.   HEENT: normocephalic, atraumatic; sclera clear, conjunctiva pink; hearing intact; oropharynx clear; neck supple  Lungs- Clear to ausculation bilaterally, normal work of breathing.  No wheezes,  rales, rhonchi Heart- Regular rate and rhythm  GI- soft, non-tender, non-distended, bowel sounds present  Extremities- no clubbing, cyanosis, 2+ bilateral dependent edema MS- no significant deformity or atrophy Skin- warm and dry, no rash or lesion  Psych- euthymic mood, full affect Neuro- strength and sensation are intact   EKG:  EKG is ordered today. The ekg ordered today shows sinus rhythm with paroxysms of atrial fibrillation   Recent Labs: 02/12/2017: ALT 15; TSH 1.75 05/01/2017: BUN 29; Creatinine, Ser 1.44; Hemoglobin  15.5; Platelets 233; Potassium 4.1; Sodium 141    Other studies Reviewed: Additional studies/ records that were reviewed today include:Dr Caryl Comes and AF clinic notes  Assessment and Plan: 1.  Persistent atrial fibrillation Converted to SR with Flecainide Continue OAC for CHADS2VASC of 5 EKG with likely PVI trigger today  Increase Flecainide to 75mg  twice daily today. If he is unable to tolerate, will need to consider alternate AAD therapy.  Continue Kardia monitoring - he is aware we will review at next office visit.  2.  Presumed tachycardia mediated cardiomyopathy Repeat echo in 6 weeks to see if EF improved Continue ACE-I/BB  3.  HTN Stable No change required today    Current medicines are reviewed at length with the patient today.   The patient does not have concerns regarding his medicines.  The following changes were made today:  none  Labs/ tests ordered today include: echo in 6 weeks Orders Placed This Encounter  Procedures  . EKG 12-Lead  . ECHOCARDIOGRAM COMPLETE     Disposition:   Follow up with Dr Caryl Comes in 8 weeks    Signed, Chanetta Marshall, NP 05/15/2017 10:17 AM   [facility] [address] [place] [address] [phone] (office) [phone] (fax)

## 2017-05-15 ENCOUNTER — Ambulatory Visit: Payer: PPO | Admitting: Nurse Practitioner

## 2017-05-15 ENCOUNTER — Encounter: Payer: Self-pay | Admitting: Nurse Practitioner

## 2017-05-15 VITALS — BP 122/66 | HR 94 | Ht 67.5 in | Wt 208.0 lb

## 2017-05-15 DIAGNOSIS — I481 Persistent atrial fibrillation: Secondary | ICD-10-CM

## 2017-05-15 DIAGNOSIS — I428 Other cardiomyopathies: Secondary | ICD-10-CM | POA: Diagnosis not present

## 2017-05-15 DIAGNOSIS — I10 Essential (primary) hypertension: Secondary | ICD-10-CM

## 2017-05-15 DIAGNOSIS — I4819 Other persistent atrial fibrillation: Secondary | ICD-10-CM

## 2017-05-15 MED ORDER — FLECAINIDE ACETATE 50 MG PO TABS: 75.0000 mg | ORAL_TABLET | Freq: Two times a day (BID) | ORAL | 1 refills | Status: DC

## 2017-05-15 NOTE — Addendum Note (Signed)
Addended byDamita Dunnings D on: 05/15/2017 04:45 PM   Modules accepted: Orders

## 2017-05-15 NOTE — Patient Instructions (Addendum)
Medication Instructions:    START TAKING FLECAINIDE 75 MG ( A TABLET AND HALF)  TWICE A DAY   If you need a refill on your cardiac medications before your next appointment, please call your pharmacy.  Labwork: NONE ORDERED  TODAY   Testing/Procedures:  IN 6 WEEKS Your physician has requested that you have an echocardiogram. Echocardiography is a painless test that uses sound waves to create images of your heart. It provides your doctor with information about the size and shape of your heart and how well your heart's chambers and valves are working. This procedure takes approximately one hour. There are no restrictions for this procedure.    Follow-Up:  KLEIN  IN 8 WEEKS    Any Other Special Instructions Will Be Listed Below (If Applicable).

## 2017-05-22 ENCOUNTER — Encounter: Payer: Self-pay | Admitting: Internal Medicine

## 2017-05-24 ENCOUNTER — Encounter: Payer: Self-pay | Admitting: Nurse Practitioner

## 2017-05-26 MED ORDER — FLECAINIDE ACETATE 50 MG PO TABS: ORAL_TABLET | ORAL | 2 refills | Status: DC

## 2017-06-08 ENCOUNTER — Other Ambulatory Visit: Payer: Self-pay | Admitting: Internal Medicine

## 2017-06-08 DIAGNOSIS — I10 Essential (primary) hypertension: Secondary | ICD-10-CM

## 2017-06-09 NOTE — Telephone Encounter (Signed)
Pt saw NP Seiler in office on 05/15/17. His Wt was 94.3Kg. Last SCr was 1.44 from hosp on 05/01/17. Will refill Eliquis 5mg  BID under Dr. Caryl Comes.

## 2017-06-11 DIAGNOSIS — R972 Elevated prostate specific antigen [PSA]: Secondary | ICD-10-CM | POA: Diagnosis not present

## 2017-06-11 DIAGNOSIS — Z87442 Personal history of urinary calculi: Secondary | ICD-10-CM | POA: Diagnosis not present

## 2017-06-11 DIAGNOSIS — R3121 Asymptomatic microscopic hematuria: Secondary | ICD-10-CM | POA: Diagnosis not present

## 2017-06-11 DIAGNOSIS — R3912 Poor urinary stream: Secondary | ICD-10-CM | POA: Diagnosis not present

## 2017-06-11 DIAGNOSIS — R350 Frequency of micturition: Secondary | ICD-10-CM | POA: Diagnosis not present

## 2017-06-11 LAB — PSA: PSA: 10.4

## 2017-06-15 LAB — HM DIABETES EYE EXAM

## 2017-06-22 ENCOUNTER — Ambulatory Visit (HOSPITAL_COMMUNITY): Payer: PPO | Attending: Cardiology

## 2017-06-22 ENCOUNTER — Other Ambulatory Visit: Payer: Self-pay

## 2017-06-22 DIAGNOSIS — Z87891 Personal history of nicotine dependence: Secondary | ICD-10-CM | POA: Diagnosis not present

## 2017-06-22 DIAGNOSIS — Z8249 Family history of ischemic heart disease and other diseases of the circulatory system: Secondary | ICD-10-CM | POA: Insufficient documentation

## 2017-06-22 DIAGNOSIS — I10 Essential (primary) hypertension: Secondary | ICD-10-CM | POA: Insufficient documentation

## 2017-06-22 DIAGNOSIS — E119 Type 2 diabetes mellitus without complications: Secondary | ICD-10-CM | POA: Diagnosis not present

## 2017-06-22 DIAGNOSIS — I428 Other cardiomyopathies: Secondary | ICD-10-CM

## 2017-06-22 DIAGNOSIS — R609 Edema, unspecified: Secondary | ICD-10-CM | POA: Insufficient documentation

## 2017-06-22 DIAGNOSIS — I4891 Unspecified atrial fibrillation: Secondary | ICD-10-CM | POA: Diagnosis not present

## 2017-06-22 DIAGNOSIS — E785 Hyperlipidemia, unspecified: Secondary | ICD-10-CM | POA: Diagnosis not present

## 2017-06-22 DIAGNOSIS — I340 Nonrheumatic mitral (valve) insufficiency: Secondary | ICD-10-CM | POA: Insufficient documentation

## 2017-06-29 DIAGNOSIS — R3121 Asymptomatic microscopic hematuria: Secondary | ICD-10-CM | POA: Diagnosis not present

## 2017-06-29 DIAGNOSIS — K8020 Calculus of gallbladder without cholecystitis without obstruction: Secondary | ICD-10-CM | POA: Diagnosis not present

## 2017-07-06 ENCOUNTER — Ambulatory Visit: Payer: PPO | Admitting: Internal Medicine

## 2017-07-06 ENCOUNTER — Encounter: Payer: Self-pay | Admitting: Internal Medicine

## 2017-07-06 ENCOUNTER — Telehealth: Payer: Self-pay | Admitting: *Deleted

## 2017-07-06 VITALS — BP 130/98 | HR 119 | Ht 67.0 in | Wt 211.0 lb

## 2017-07-06 DIAGNOSIS — R3121 Asymptomatic microscopic hematuria: Secondary | ICD-10-CM | POA: Diagnosis not present

## 2017-07-06 DIAGNOSIS — I481 Persistent atrial fibrillation: Secondary | ICD-10-CM

## 2017-07-06 DIAGNOSIS — I4819 Other persistent atrial fibrillation: Secondary | ICD-10-CM

## 2017-07-06 DIAGNOSIS — N401 Enlarged prostate with lower urinary tract symptoms: Secondary | ICD-10-CM | POA: Diagnosis not present

## 2017-07-06 DIAGNOSIS — I509 Heart failure, unspecified: Secondary | ICD-10-CM | POA: Diagnosis not present

## 2017-07-06 DIAGNOSIS — N210 Calculus in bladder: Secondary | ICD-10-CM | POA: Diagnosis not present

## 2017-07-06 DIAGNOSIS — I10 Essential (primary) hypertension: Secondary | ICD-10-CM

## 2017-07-06 DIAGNOSIS — I428 Other cardiomyopathies: Secondary | ICD-10-CM

## 2017-07-06 DIAGNOSIS — R3915 Urgency of urination: Secondary | ICD-10-CM | POA: Diagnosis not present

## 2017-07-06 MED ORDER — FUROSEMIDE 40 MG PO TABS: 40.0000 mg | ORAL_TABLET | Freq: Every day | ORAL | 3 refills | Status: DC

## 2017-07-06 NOTE — Progress Notes (Signed)
Patient Care Team: Colon Branch, MD as PCP - Evalina Field, MD as Consulting Physician (Ophthalmology) Ceasar Mons, MD as Consulting Physician (Urology)   HPI  Ronald [name] is a 83 y.o. male Seen in follow-up for incessant atrial arrhythmias. It was elected because of his CHADS-VASc score and the in's rate of his atrial arrhythmia to begin him on anticoagulation.    Date Cr Hgb  2/18   17.3  6/18 1.21    12/18 1.33 16.1       He has had atrial fibrillation and was treated with flecainide.  He was unable to tolerate 100 mg secondary to nausea; it was reduced to 50 mg.     DATE TEST    12/17 Echo    EF 60-65 %   1/19  Echo  EF 25-30 %   4/19 Echo  EF 25-30 %    Seen the atrial fibrillation clinic 2/19 following cardioversion the notes outlined that there is no significant symptomatic improvement in sinus rhythm.  This was reiterated in the notes from 3/19 at which time the electrocardiogram demonstrated short runs of atrial tachycardia interspersed with sinus rhythm.; however, over the recent weeks  he has had increasing problems with shortness of breath accompanied by worsening peripheral edema and paroxysmal nocturnal dyspnea.  No palpitations.  Records and Results Reviewed   Past Medical History:  Diagnosis Date  . BPH (benign prostatic hypertrophy)    f/u @ Biopsy, s/p bx 2009 apro (-)  . Calculus of kidney    s/p R percutaneus nephrostomy @ [facility]  . Cataract   . Colon polyp   . Complication of anesthesia    violent upon waking up  . Diabetes mellitus    pre diabetic  . Diverticulosis   . Edema leg    asymetric , L > R , [place] neg for DVT 09-2010  . GERD (gastroesophageal reflux disease)   . Hemorrhoids   . Hypercalcemia    parathyroidectomy 04/2008  . Hyperlipidemia   . Hypertension   . Iron deficiency anemia     Past Surgical History:  Procedure Laterality Date  . APPENDECTOMY    . CATARACT EXTRACTION Right   . COLONOSCOPY   12/16/2011   Procedure: COLONOSCOPY;  Surgeon: Inda Castle, MD;  Location: [facility];  Service: Endoscopy;  Laterality: N/A;  . ESOPHAGOGASTRODUODENOSCOPY N/A 01/17/2013   Procedure: ESOPHAGOGASTRODUODENOSCOPY (EGD);  Surgeon: Inda Castle, MD;  Location: [facility];  Service: Endoscopy;  Laterality: N/A;  . EXTERNAL EAR SURGERY  01/2016   BCC removal  . EYE SURGERY  infant   "lazy eye"  . FLEXIBLE SIGMOIDOSCOPY  02/09/2012   Procedure: FLEXIBLE SIGMOIDOSCOPY;  Surgeon: Inda Castle, MD;  Location: [facility];  Service: Endoscopy;  Laterality: N/A;  . HEMORRHOID BANDING  02/09/2012   Procedure: HEMORRHOID BANDING;  Surgeon: Inda Castle, MD;  Location: [facility];  Service: Endoscopy;  Laterality: N/A;  . HOT HEMOSTASIS N/A 01/17/2013   Procedure: HOT HEMOSTASIS (ARGON PLASMA COAGULATION/BICAP);  Surgeon: Inda Castle, MD;  Location: [facility];  Service: Endoscopy;  Laterality: N/A;  . KIDNEY STONE SURGERY    . PARATHYROIDECTOMY  04/2008  . TONSILLECTOMY      Current Outpatient Medications  Medication Sig Dispense Refill  . benazepril (LOTENSIN) 20 MG tablet Take 1 tablet (20 mg total) by mouth daily. 90 tablet 3  . cholecalciferol (VITAMIN D) 1000 UNITS tablet Take 1,000 Units by mouth  daily.    Marland Kitchen ELIQUIS 5 MG TABS tablet TAKE 1 TABLET BY MOUTH TWICE DAILY 180 tablet 3  . flecainide (TAMBOCOR) 50 MG tablet Take 1 tablet (50 mg total) every morning and take 2 tablets (100 mg total) every evening. 270 tablet 2  . metoprolol succinate (TOPROL-XL) 50 MG 24 hr tablet Take 1 tablet (50 mg total) by mouth daily. 90 tablet 3  . Multiple Vitamins-Minerals (PRESERVISION AREDS 2) CAPS Take 1 capsule by mouth daily.    Marland Kitchen NIFEdipine (PROCARDIA XL/ADALAT-CC) 60 MG 24 hr tablet Take 1 tablet (60 mg total) by mouth daily. 90 tablet 3  . omeprazole (PRILOSEC) 20 MG capsule Take 1 capsule (20 mg total) by mouth daily. 90 capsule 3  . Saw Palmetto, Serenoa repens, (SAW PALMETTO  PO) Take 540 mg by mouth daily.     . simvastatin (ZOCOR) 40 MG tablet Take 1 tablet (40 mg total) by mouth at bedtime. 90 tablet 3   No current facility-administered medications for this visit.     Allergies  Allergen Reactions  . Cucumber Extract Nausea And Vomiting  . Sulfonamide Derivatives Other (See Comments)    "don't know reaction" per pt      Review of Systems negative except from HPI and PMH  Physical Exam BP (!) 130/98   Pulse (!) 119   Ht 5\' 7"  (1.702 m)   Wt 211 lb (95.7 kg)   SpO2 96%   BMI 33.05 kg/m  Well developed and nourished in no acute distress HENT normal Neck supple with JVP-8 Clear Fast and irregular rate and rhythm, no murmurs or gallops Abd-soft with active BS No Clubbing cyanosis 2-3+ chronic edema Skin-warm and dry A & Oriented  Grossly normal sensory and motor function  ECG today atrial tachycardia/flutter with variable conduction and an average rate of 146 Interval-/0 8/31  Assessment and  Plan  Atrial tach and fibrillation-persistent  Hypertension  Chronic edema   The patient has developed atrial flutter and now has class III congestive heart failure with nocturnal dyspnea peripheral edema and dyspnea on exertion.  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  We have discussed various strategies.  One was to change antiarrhythmic drugs to either dofetilide or amiodarone.  We reviewed protocols and side effects.  The other is an older strategy that is to ablate his flutter circuit maintain him on flecainide with the hopes that the flecainide would keep his atrial fibrillation somewhat at [place] but more importantly would keep him from having recurrent atrial flutter with a more rapid ventricular response.  The advantages of this are that he does not need to make a  decision regarding class III medications at this time.  We have discussed catheter ablation the potential benefits and risk.  He is agreeable.  He is also having a little bit of microscopic hematuria and is to see urology.  He needs a tooth extracted.  These will need to be deferred to 4 weeks following catheter ablation.  We will initiate diuretics in anticipation of the procedure to help manage his heart failure  (furosemide 40)  More than 50% of 45 min was spent in counseling related to the above

## 2017-07-06 NOTE — Telephone Encounter (Signed)
Per Dr Caryl Comes, Pt is OK to begin Cipro with his Flecainide. Pt aware, had no additional questions.

## 2017-07-06 NOTE — Telephone Encounter (Signed)
Received results from [place] office visit; forwarded to provider/SLS 04/29

## 2017-07-06 NOTE — Patient Instructions (Addendum)
Medication Instructions:  Your physician has recommended you make the following change in your medication:   1. Begin Lasix, 40mg  tablet, once daily.  Labwork: You will have a CBC and BMP drawn today.  Testing/Procedures: Your physician has recommended that you have an ablation. Catheter ablation is a medical procedure used to treat some cardiac arrhythmias (irregular heartbeats). During catheter ablation, a long, thin, flexible tube is put into a blood vessel in your groin (upper thigh), or neck. This tube is called an ablation catheter. It is then guided to your heart through the blood vessel. Radio frequency waves destroy small areas of heart tissue where abnormal heartbeats may cause an arrhythmia to start. Please see the instruction sheet given to you today.   Follow-Up: Your physician recommends that you schedule a follow-up appointment in:   4 weeks from your procedure with Dr Caryl Comes or Chanetta Marshall.  Any Other Special Instructions Will Be Listed Below (If Applicable).     If you need a refill on your cardiac medications before your next appointment, please call your pharmacy.

## 2017-07-07 DIAGNOSIS — H26491 Other secondary cataract, right eye: Secondary | ICD-10-CM | POA: Diagnosis not present

## 2017-07-07 LAB — CBC WITH DIFFERENTIAL/PLATELET
BASOS ABS: 0 10*3/uL (ref 0.0–0.2)
Basos: 0 %
EOS (ABSOLUTE): 0.1 10*3/uL (ref 0.0–0.4)
Eos: 1 %
HEMATOCRIT: 47.9 % (ref 37.5–51.0)
Hemoglobin: 15.8 g/dL (ref 13.0–17.7)
Immature Grans (Abs): 0 10*3/uL (ref 0.0–0.1)
Immature Granulocytes: 0 %
LYMPHS ABS: 2.7 10*3/uL (ref 0.7–3.1)
Lymphs: 28 %
MCH: 28.5 pg (ref 26.6–33.0)
MCHC: 33 g/dL (ref 31.5–35.7)
MCV: 87 fL (ref 79–97)
MONOS ABS: 0.9 10*3/uL (ref 0.1–0.9)
Monocytes: 9 %
Neutrophils Absolute: 5.9 10*3/uL (ref 1.4–7.0)
Neutrophils: 62 %
Platelets: 263 10*3/uL (ref 150–379)
RBC: 5.54 x10E6/uL (ref 4.14–5.80)
RDW: 15.3 % (ref 12.3–15.4)
WBC: 9.6 10*3/uL (ref 3.4–10.8)

## 2017-07-07 LAB — BASIC METABOLIC PANEL
BUN/Creatinine Ratio: 21 (ref 10–24)
BUN: 28 mg/dL — ABNORMAL HIGH (ref 8–27)
CHLORIDE: 102 mmol/L (ref 96–106)
CO2: 25 mmol/L (ref 20–29)
Calcium: 9.2 mg/dL (ref 8.6–10.2)
Creatinine, Ser: 1.31 mg/dL — ABNORMAL HIGH (ref 0.76–1.27)
GFR calc Af Amer: 59 mL/min/{1.73_m2} — ABNORMAL LOW (ref >59)
GFR calc non Af Amer: 51 mL/min/{1.73_m2} — ABNORMAL LOW (ref >59)
GLUCOSE: 98 mg/dL (ref 65–99)
Potassium: 4.3 mmol/L (ref 3.5–5.2)
SODIUM: 144 mmol/L (ref 134–144)

## 2017-07-08 ENCOUNTER — Other Ambulatory Visit: Payer: Self-pay

## 2017-07-08 ENCOUNTER — Emergency Department (HOSPITAL_BASED_OUTPATIENT_CLINIC_OR_DEPARTMENT_OTHER)
Admission: EM | Admit: 2017-07-08 | Discharge: 2017-07-08 | Disposition: A | Discharge status: Home / Self Care | Payer: PPO | Attending: Emergency Medicine | Admitting: Emergency Medicine

## 2017-07-08 ENCOUNTER — Encounter (HOSPITAL_BASED_OUTPATIENT_CLINIC_OR_DEPARTMENT_OTHER): Payer: Self-pay | Admitting: *Deleted

## 2017-07-08 ENCOUNTER — Telehealth: Payer: Self-pay | Admitting: Internal Medicine

## 2017-07-08 DIAGNOSIS — Z87891 Personal history of nicotine dependence: Secondary | ICD-10-CM | POA: Diagnosis not present

## 2017-07-08 DIAGNOSIS — Z7901 Long term (current) use of anticoagulants: Secondary | ICD-10-CM | POA: Insufficient documentation

## 2017-07-08 DIAGNOSIS — E119 Type 2 diabetes mellitus without complications: Secondary | ICD-10-CM | POA: Insufficient documentation

## 2017-07-08 DIAGNOSIS — Z79899 Other long term (current) drug therapy: Secondary | ICD-10-CM | POA: Diagnosis not present

## 2017-07-08 DIAGNOSIS — I10 Essential (primary) hypertension: Secondary | ICD-10-CM | POA: Diagnosis not present

## 2017-07-08 DIAGNOSIS — R040 Epistaxis: Secondary | ICD-10-CM | POA: Diagnosis not present

## 2017-07-08 MED ORDER — CEPHALEXIN 500 MG PO CAPS: 500.0000 mg | ORAL_CAPSULE | Freq: Four times a day (QID) | ORAL | 0 refills | Status: DC

## 2017-07-08 MED ORDER — OXYMETAZOLINE HCL 0.05 % NA SOLN
NASAL | Status: AC
  Filled 2017-07-08: qty 15

## 2017-07-08 MED ORDER — OXYMETAZOLINE HCL 0.05 % NA SOLN
NASAL | Status: AC
  Administered 2017-07-08: 1 via NASAL
  Filled 2017-07-08: qty 15

## 2017-07-08 MED ORDER — OXYMETAZOLINE HCL 0.05 % NA SOLN
1.0000 | Freq: Once | NASAL | Status: AC
  Administered 2017-07-08: 1 via NASAL

## 2017-07-08 MED ORDER — SILVER NITRATE-POT NITRATE 75-25 % EX MISC
1.0000 "application " | Freq: Once | CUTANEOUS | Status: AC
  Administered 2017-07-08: 1 via TOPICAL
  Filled 2017-07-08: qty 1

## 2017-07-08 MED FILL — CEPHALEXIN 500 MG CAPSULE: 500 | 7 days supply | Qty: 28 | Fill #0

## 2017-07-08 NOTE — Telephone Encounter (Signed)
New Message   Pt c/o medication issue:  1. Name of Medication: ELIQUIS 5 MG TABS tablet  2. How are you currently taking this medication (dosage and times per day)? TAKE 1 TABLET BY MOUTH TWICE DAILY  3. Are you having a reaction (difficulty breathing--STAT)? no  4. What is your medication issue? Pt states that he went to the er due to nose bleeding and they held his Eliquis and he has a nose packing that cant come out until Monday 5/6 but has an ablation scheduled for 5/3. Please call

## 2017-07-08 NOTE — ED Provider Notes (Signed)
[facility] Provider Note   CSN: [id number] Arrival date & time: 07/08/17  1128     History   Chief Complaint Chief Complaint  Patient presents with  . Epistaxis    HPI Ronald [name] is a 83 y.o. male.  Pt has a hx of frequent nose bleeds and his right nare started bleeding again today at 10 am.  The pt does take Eliquis for a.fib.  The pt has had to get his nose packed in the past for the same.  Last was a year ago.  Pt denies any trauma.  He is scheduled for an ablation on Friday, May 3.  CHA2DS2/VAS Stroke Risk Points  Current as of a minute ago     4 >= 2 Points: High Risk  1 - 1.99 Points: Medium Risk  0 Points: Low Risk    This is the only CHA2DS2/VAS Stroke Risk Points available for the past  year.:  Last Change: N/A     Details    This score determines the patient's risk of having a stroke if the  patient has atrial fibrillation.       Points Metrics  0 Has Congestive Heart Failure:  No    Current as of a minute ago  0 Has Vascular Disease:  No    Current as of a minute ago  1 Has Hypertension:  Yes    Current as of a minute ago  2 Age:  79    Current as of a minute ago  1 Has Diabetes:  Yes    Current as of a minute ago  0 Had Stroke:  No  Had TIA:  No  Had thromboembolism:  No    Current as of a minute ago  0 Male:  No    Current as of a minute ago              Past Medical History:  Diagnosis Date  . BPH (benign prostatic hypertrophy)    f/u @ Biopsy, s/p bx 2009 apro (-)  . Calculus of kidney    s/p R percutaneus nephrostomy @ [facility]  . Cataract   . Colon polyp   . Complication of anesthesia    violent upon waking up  . Diabetes mellitus    pre diabetic  . Diverticulosis   . Edema leg    asymetric , L > R , [place] neg for DVT 09-2010  . GERD (gastroesophageal reflux disease)   . Hemorrhoids   . Hypercalcemia    parathyroidectomy 04/2008  . Hyperlipidemia   . Hypertension   . Iron deficiency anemia      Patient Active Problem List   Diagnosis Date Noted  . Atrial flutter ([place]) 08/09/2016  . PCP NOTES >>>>>>>>>>>>> 12/02/2014  . Iron deficiency anemia, unspecified 01/17/2013  . 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 Personal history of colonic polyps 12/01/2011  . Annual physical exam 09/23/2010  . Elevated PSA 04/12/2008  . ERECTILE DYSFUNCTION 05/25/2007  . DMII (diabetes mellitus, type 2) ([place]) 03/02/2007  . CALCULUS OF KIDNEY 02/15/2007  . HYPERPARATHYROIDISM UNSPECIFIED 11/16/2006  . HYPERLIPIDEMIA 11/11/2006  . Essential hypertension 11/11/2006  . GERD 11/11/2006    Past Surgical History:  Procedure Laterality Date  . APPENDECTOMY    . CATARACT EXTRACTION Right   . COLONOSCOPY  12/16/2011   Procedure: COLONOSCOPY;  Surgeon: Inda Castle, MD;  Location: [facility] ENDOSCOPY;  Service: Endoscopy;  Laterality: N/A;  . ESOPHAGOGASTRODUODENOSCOPY N/A  01/17/2013   Procedure: ESOPHAGOGASTRODUODENOSCOPY (EGD);  Surgeon: Inda Castle, MD;  Location: [facility];  Service: Endoscopy;  Laterality: N/A;  . EXTERNAL EAR SURGERY  01/2016   BCC removal  . EYE SURGERY  infant   "lazy eye"  . FLEXIBLE SIGMOIDOSCOPY  02/09/2012   Procedure: FLEXIBLE SIGMOIDOSCOPY;  Surgeon: Inda Castle, MD;  Location: [facility];  Service: Endoscopy;  Laterality: N/A;  . HEMORRHOID BANDING  02/09/2012   Procedure: HEMORRHOID BANDING;  Surgeon: Inda Castle, MD;  Location: [facility];  Service: Endoscopy;  Laterality: N/A;  . HOT HEMOSTASIS N/A 01/17/2013   Procedure: HOT HEMOSTASIS (ARGON PLASMA COAGULATION/BICAP);  Surgeon: Inda Castle, MD;  Location: [facility];  Service: Endoscopy;  Laterality: N/A;  . KIDNEY STONE SURGERY    . PARATHYROIDECTOMY  04/2008  . TONSILLECTOMY          Home Medications    Prior to Admission medications   Medication Sig Start Date End Date Taking? Authorizing Provider  benazepril (LOTENSIN) 20 MG tablet Take 1 tablet (20 mg total) by mouth daily.  02/18/17   Colon Branch, MD  cephALEXin (KEFLEX) 500 MG capsule Take 1 capsule (500 mg total) by mouth 4 (four) times daily. 07/08/17   Isla Pence, MD  cholecalciferol (VITAMIN D) 1000 UNITS tablet Take 1,000 Units by mouth daily.    [provider]  ELIQUIS 5 MG TABS tablet TAKE 1 TABLET BY MOUTH TWICE DAILY 06/09/17   Deboraha Sprang, MD  flecainide (TAMBOCOR) 50 MG tablet Take 1 tablet (50 mg total) every morning and take 2 tablets (100 mg total) every evening. Patient taking differently: Take 50-100 mg by mouth See admin instructions. Take 1 tablet (50 mg total) every morning and take 2 tablets (100 mg total) every evening. 05/26/17   Patsey Berthold, NP  furosemide (LASIX) 40 MG tablet Take 1 tablet (40 mg total) by mouth daily. 07/06/17 10/04/17  Deboraha Sprang, MD  metoprolol succinate (TOPROL-XL) 50 MG 24 hr tablet Take 1 tablet (50 mg total) by mouth daily. 03/23/17   Colon Branch, MD  Multiple Vitamins-Minerals (PRESERVISION AREDS 2) CAPS Take 1 capsule by mouth daily.    [provider]  NIFEdipine (PROCARDIA XL/ADALAT-CC) 60 MG 24 hr tablet Take 1 tablet (60 mg total) by mouth daily. 06/08/17   Colon Branch, MD  omeprazole (PRILOSEC) 20 MG capsule Take 1 capsule (20 mg total) by mouth daily. 08/25/16   Colon Branch, MD  Saw Palmetto, Serenoa repens, (SAW PALMETTO PO) Take 540 mg by mouth daily.     [provider]  simvastatin (ZOCOR) 40 MG tablet Take 1 tablet (40 mg total) by mouth at bedtime. 02/23/17   Colon Branch, MD    Family History Family History  Problem Relation Age of Onset  . Heart attack Father 25  . Heart attack Mother   . Colon cancer Neg Hx   . Prostate cancer Neg Hx   . Diabetes Neg Hx     Social History Social History   Tobacco Use  . Smoking status: Former Smoker    Years: 25.00    Types: Cigarettes    Last attempt to quit: 11/30/1981    Years since quitting: 35.6  . Smokeless tobacco: Never Used  Substance Use Topics  . Alcohol use:  Yes    Alcohol/week: 4.2 oz    Types: 7 Glasses of wine per week    Comment: 1 glass of wine or  beer with dinner  . Drug use: No     Allergies   Cucumber extract and Sulfonamide derivatives   Review of Systems Review of Systems  HENT: Positive for nosebleeds.   All other systems reviewed and are negative.    Physical Exam Updated Vital Signs BP 129/87 (BP Location: Left Arm)   Pulse (!) 121   Temp 97.9 F (36.6 C) (Oral)   Resp 18   Ht 5\' 7"  (1.702 m)   Wt 93 kg (205 lb)   SpO2 97%   BMI 32.11 kg/m   Physical Exam  Constitutional: He is oriented to person, place, and time. He appears well-developed and well-nourished.  HENT:  Head: Normocephalic and atraumatic.  Right Ear: External ear normal.  Left Ear: External ear normal.  Nose: Epistaxis is observed.  Mouth/Throat: Oropharynx is clear and moist.  Eyes: Pupils are equal, round, and reactive to light. Conjunctivae and EOM are normal.  Neck: Normal range of motion. Neck supple.  Cardiovascular: Normal rate, normal heart sounds and intact distal pulses. An irregularly irregular rhythm present.  Pulmonary/Chest: Effort normal and breath sounds normal.  Abdominal: Soft. Bowel sounds are normal.  Musculoskeletal: Normal range of motion.  Neurological: He is alert and oriented to person, place, and time.  Skin: Skin is warm. Capillary refill takes less than 2 seconds.  Psychiatric: He has a normal mood and affect.  Nursing note and vitals reviewed.    ED Treatments / Results  Labs (all labs ordered are listed, but only abnormal results are displayed) Labs Reviewed - No data to display  EKG None  Radiology No results found.  Procedures .Epistaxis Management Date/Time: 07/08/2017 12:01 PM Performed by: Isla Pence, MD Authorized by: Isla Pence, MD   Consent:    Consent obtained:  Verbal   Consent given by:  Patient   Risks discussed:  Bleeding and pain   Alternatives discussed:  No  treatment Anesthesia (see MAR for exact dosages):    Anesthesia method:  None Procedure details:    Treatment site:  R anterior   Treatment method:  Nasal balloon   Treatment complexity:  Limited   Treatment episode: initial   Post-procedure details:    Assessment:  Bleeding decreased   Patient tolerance of procedure:  Tolerated well, no immediate complications   (including critical care time)  Medications Ordered in ED Medications  oxymetazoline (AFRIN) 0.05 % nasal spray (has no administration in time range)  oxymetazoline (AFRIN) 0.05 % nasal spray (has no administration in time range)  silver nitrate applicators applicator 1 application (has no administration in time range)     Initial Impression / Assessment and Plan / ED Course  I have reviewed the triage vital signs and the nursing notes.  Pertinent labs & imaging results that were available during my care of the patient were reviewed by me and considered in my medical decision making (see chart for details).    Total of 2 cc of air was placed into packing.  Pt able to ambulate without any significant bleeding.   Pt to f/u with ENT in 2 days for packing removal.  Return if worse.  Final Clinical Impressions(s) / ED Diagnoses   Final diagnoses:  Epistaxis  On apixaban therapy    ED Discharge Orders        Ordered    cephALEXin (KEFLEX) 500 MG capsule  4 times daily     07/08/17 1406       Isla Pence, MD 07/08/17 1409

## 2017-07-08 NOTE — Telephone Encounter (Signed)
Called patient about his message. Patient is wanting to know if he needs to cancel his Ablation on 5/3 since his eliquis is on hold and one of his nasal passages is packed.  Will send to Dr. Caryl Comes and his nurse for advisement on rescheduling.

## 2017-07-08 NOTE — ED Triage Notes (Signed)
Pt reports epitaxis onset approx 10am. Pt has hx of nosebleeds, take eloquis, ent cart at bedside, dr. Gilford Raid at bedside for exam.

## 2017-07-09 ENCOUNTER — Encounter: Payer: Self-pay | Admitting: Internal Medicine

## 2017-07-09 NOTE — Telephone Encounter (Signed)
Spoke with pt today regarding his nose bleed. He would like to reschedule his ablation until June 5th. He also would like to know when he should start taking his Eliquis again. His packing will be removed Monday May 6. I advised him I would speak to Dr Caryl Comes regarding this when he is back in the office this afternoon and I would contact him back.

## 2017-07-09 NOTE — Telephone Encounter (Signed)
Per Dr Caryl Comes, pt is ok to stay off Eliquis until Monday when his packing is pulled. Pt understands he will need to be on his Eliquis and therapeutic before he can have his ablation. His ablation has been rescheduled for June 5. Pt verbalized understanding and had no additional questions.

## 2017-07-10 ENCOUNTER — Telehealth: Payer: Self-pay | Admitting: Internal Medicine

## 2017-07-10 DIAGNOSIS — R911 Solitary pulmonary nodule: Secondary | ICD-10-CM

## 2017-07-10 NOTE — Addendum Note (Signed)
Addended byDamita Dunnings D on: 07/10/2017 03:02 PM   Modules accepted: Orders

## 2017-07-10 NOTE — Telephone Encounter (Signed)
MyChart message sent. CT chest wo ordered to be completed in 3 months.

## 2017-07-10 NOTE — Telephone Encounter (Signed)
CT done at urology 06/29/2017, non-urological findings include: RML lung nodule, 1.7 cm, consider CT in 3 months Gallbladder stones Hepatic steatosis. Left adrenal adenoma Advised patient I am aware of the results, recommend CT chest without in 3 months, dx lung nodule.  Please enter a future order.

## 2017-07-13 ENCOUNTER — Encounter: Payer: Self-pay | Admitting: Internal Medicine

## 2017-07-13 DIAGNOSIS — R040 Epistaxis: Secondary | ICD-10-CM | POA: Diagnosis not present

## 2017-07-14 ENCOUNTER — Encounter: Payer: Self-pay | Admitting: Internal Medicine

## 2017-07-21 ENCOUNTER — Encounter: Payer: Self-pay | Admitting: Internal Medicine

## 2017-07-25 ENCOUNTER — Other Ambulatory Visit: Payer: Self-pay

## 2017-07-25 ENCOUNTER — Emergency Department (HOSPITAL_COMMUNITY)
Admission: EM | Admit: 2017-07-25 | Discharge: 2017-07-25 | Disposition: A | Discharge status: Home / Self Care | Payer: PPO | Attending: Emergency Medicine | Admitting: Emergency Medicine

## 2017-07-25 ENCOUNTER — Emergency Department (HOSPITAL_COMMUNITY): Payer: PPO

## 2017-07-25 ENCOUNTER — Encounter (HOSPITAL_COMMUNITY): Payer: Self-pay | Admitting: Emergency Medicine

## 2017-07-25 DIAGNOSIS — R404 Transient alteration of awareness: Secondary | ICD-10-CM | POA: Diagnosis not present

## 2017-07-25 DIAGNOSIS — E785 Hyperlipidemia, unspecified: Secondary | ICD-10-CM | POA: Diagnosis not present

## 2017-07-25 DIAGNOSIS — Z87891 Personal history of nicotine dependence: Secondary | ICD-10-CM | POA: Diagnosis not present

## 2017-07-25 DIAGNOSIS — Z7901 Long term (current) use of anticoagulants: Secondary | ICD-10-CM | POA: Insufficient documentation

## 2017-07-25 DIAGNOSIS — E119 Type 2 diabetes mellitus without complications: Secondary | ICD-10-CM | POA: Insufficient documentation

## 2017-07-25 DIAGNOSIS — Z79899 Other long term (current) drug therapy: Secondary | ICD-10-CM | POA: Insufficient documentation

## 2017-07-25 DIAGNOSIS — I4892 Unspecified atrial flutter: Secondary | ICD-10-CM | POA: Diagnosis not present

## 2017-07-25 DIAGNOSIS — I10 Essential (primary) hypertension: Secondary | ICD-10-CM | POA: Diagnosis not present

## 2017-07-25 DIAGNOSIS — R42 Dizziness and giddiness: Secondary | ICD-10-CM | POA: Diagnosis not present

## 2017-07-25 DIAGNOSIS — R0602 Shortness of breath: Secondary | ICD-10-CM | POA: Diagnosis not present

## 2017-07-25 DIAGNOSIS — R0789 Other chest pain: Secondary | ICD-10-CM | POA: Diagnosis not present

## 2017-07-25 DIAGNOSIS — R000 Tachycardia, unspecified: Secondary | ICD-10-CM | POA: Diagnosis not present

## 2017-07-25 LAB — BASIC METABOLIC PANEL
ANION GAP: 9 (ref 5–15)
BUN: 31 mg/dL — ABNORMAL HIGH (ref 6–20)
CO2: 24 mmol/L (ref 22–32)
CREATININE: 1.8 mg/dL — AB (ref 0.61–1.24)
Calcium: 8.1 mg/dL — ABNORMAL LOW (ref 8.9–10.3)
Chloride: 107 mmol/L (ref 101–111)
GFR calc non Af Amer: 34 mL/min — ABNORMAL LOW (ref >60)
GFR, EST AFRICAN AMERICAN: 39 mL/min — AB (ref >60)
Glucose, Bld: 168 mg/dL — ABNORMAL HIGH (ref 65–99)
Potassium: 4.9 mmol/L (ref 3.5–5.1)
SODIUM: 140 mmol/L (ref 135–145)

## 2017-07-25 LAB — I-STAT CHEM 8, ED
BUN: 33 mg/dL — ABNORMAL HIGH (ref 6–20)
CREATININE: 1.6 mg/dL — AB (ref 0.61–1.24)
Calcium, Ion: 1.02 mmol/L — ABNORMAL LOW (ref 1.15–1.40)
Chloride: 106 mmol/L (ref 101–111)
Glucose, Bld: 166 mg/dL — ABNORMAL HIGH (ref 65–99)
HEMATOCRIT: 42 % (ref 39.0–52.0)
HEMOGLOBIN: 14.3 g/dL (ref 13.0–17.0)
POTASSIUM: 4.6 mmol/L (ref 3.5–5.1)
SODIUM: 142 mmol/L (ref 135–145)
TCO2: 25 mmol/L (ref 22–32)

## 2017-07-25 LAB — CBC
HCT: 43.1 % (ref 39.0–52.0)
HEMOGLOBIN: 13.5 g/dL (ref 13.0–17.0)
MCH: 27.1 pg (ref 26.0–34.0)
MCHC: 31.3 g/dL (ref 30.0–36.0)
MCV: 86.4 fL (ref 78.0–100.0)
PLATELETS: 293 10*3/uL (ref 150–400)
RBC: 4.99 MIL/uL (ref 4.22–5.81)
RDW: 14.6 % (ref 11.5–15.5)
WBC: 10 10*3/uL (ref 4.0–10.5)

## 2017-07-25 LAB — I-STAT TROPONIN, ED: TROPONIN I, POC: 0.01 ng/mL (ref 0.00–0.08)

## 2017-07-25 MED ORDER — AMIODARONE HCL IN DEXTROSE 360-4.14 MG/200ML-% IV SOLN: 30.0000 mg/h | INTRAVENOUS | Status: DC

## 2017-07-25 MED ORDER — SODIUM CHLORIDE 0.9 % IV BOLUS
1000.0000 mL | Freq: Once | INTRAVENOUS | Status: AC
  Administered 2017-07-25: 1000 mL via INTRAVENOUS

## 2017-07-25 MED ORDER — AMIODARONE HCL IN DEXTROSE 360-4.14 MG/200ML-% IV SOLN
60.0000 mg/h | INTRAVENOUS | Status: DC
  Administered 2017-07-25: 60 mg/h via INTRAVENOUS
  Filled 2017-07-25: qty 200

## 2017-07-25 MED ORDER — AMIODARONE LOAD VIA INFUSION
150.0000 mg | Freq: Once | INTRAVENOUS | Status: AC
  Administered 2017-07-25: 150 mg via INTRAVENOUS
  Filled 2017-07-25: qty 83.34

## 2017-07-25 MED ORDER — PROPOFOL 10 MG/ML IV BOLUS
0.5000 mg/kg | Freq: Once | INTRAVENOUS | Status: AC
  Administered 2017-07-25: 46.5 mg via INTRAVENOUS
  Filled 2017-07-25: qty 20

## 2017-07-25 NOTE — H&P (View-Only) (Signed)
ELECTROPHYSIOLOGY CONSULT NOTE  Patient ID: Ronald [name], MRN: [id number], DOB/AGE: 81-Mar-1936 81 y.o. Admit date: 07/25/2017 Date of Consult: 07/25/2017  Primary Physician: Colon Branch, MD Primary Cardiologist: SK Royal [place] Ronald [name] is a 83 y.o. male who is being seen today for the evaluation of Atrial flutter  at the request of ER MD.   Chief Complaint: atrial flutter    HPI Ronald [name] is a 83 y.o. male seen in ER for tachypalpitations and found to be in Atrial Flutter with 1:1   Was given amio and HR has slowed down as  He has had complaints of dizziness and shortness of breath is   Seen in follow-up for incessant atrial arrhythmias. It was elected because of his CHADS-VASc score and the in's rate of his atrial arrhythmia to begin him on anticoagulation.    Date Cr Hgb  2/18   17.3  6/18 1.21    12/18 1.33 16.1       He has had atrial fibrillation and was treated with flecainide.  He was unable to tolerate 100 mg secondary to nausea; it was reduced to 50 mg.  Has a recurrence it has come back his flutter.  It is also been increasingly slow with an atrial cycle length of about 350 ms.  Hence, 1: 1 conduction.    DATE TEST    12/17 Echo    EF 60-65 %   1/19  Echo  EF 25-30 %   4/19 Echo  EF 25-30 %        Past Medical History:  Diagnosis Date  . BPH (benign prostatic hypertrophy)    f/u @ Biopsy, s/p bx 2009 apro (-)  . Calculus of kidney    s/p R percutaneus nephrostomy @ [facility]  . Cataract   . Colon polyp   . Complication of anesthesia    violent upon waking up  . Diabetes mellitus    pre diabetic  . Diverticulosis   . Edema leg    asymetric , L > R , [place] neg for DVT 09-2010  . GERD (gastroesophageal reflux disease)   . Hemorrhoids   . Hypercalcemia    parathyroidectomy 04/2008  . Hyperlipidemia   . Hypertension   . Iron deficiency anemia       Surgical History:  Past Surgical History:  Procedure Laterality Date  . APPENDECTOMY    .  CATARACT EXTRACTION Right   . COLONOSCOPY  12/16/2011   Procedure: COLONOSCOPY;  Surgeon: Inda Castle, MD;  Location: [facility];  Service: Endoscopy;  Laterality: N/A;  . ESOPHAGOGASTRODUODENOSCOPY N/A 01/17/2013   Procedure: ESOPHAGOGASTRODUODENOSCOPY (EGD);  Surgeon: Inda Castle, MD;  Location: [facility];  Service: Endoscopy;  Laterality: N/A;  . EXTERNAL EAR SURGERY  01/2016   BCC removal  . EYE SURGERY  infant   "lazy eye"  . FLEXIBLE SIGMOIDOSCOPY  02/09/2012   Procedure: FLEXIBLE SIGMOIDOSCOPY;  Surgeon: Inda Castle, MD;  Location: [facility];  Service: Endoscopy;  Laterality: N/A;  . HEMORRHOID BANDING  02/09/2012   Procedure: HEMORRHOID BANDING;  Surgeon: Inda Castle, MD;  Location: [facility];  Service: Endoscopy;  Laterality: N/A;  . HOT HEMOSTASIS N/A 01/17/2013   Procedure: HOT HEMOSTASIS (ARGON PLASMA COAGULATION/BICAP);  Surgeon: Inda Castle, MD;  Location: [facility];  Service: Endoscopy;  Laterality: N/A;  . KIDNEY STONE SURGERY    . PARATHYROIDECTOMY  04/2008  . TONSILLECTOMY       Home Meds: Prior to  Admission medications   Medication Sig Start Date End Date Taking? Authorizing Provider  benazepril (LOTENSIN) 20 MG tablet Take 1 tablet (20 mg total) by mouth daily. 02/18/17   Colon Branch, MD  cephALEXin (KEFLEX) 500 MG capsule Take 1 capsule (500 mg total) by mouth 4 (four) times daily. 07/08/17   Isla Pence, MD  cholecalciferol (VITAMIN D) 1000 UNITS tablet Take 1,000 Units by mouth daily.    [provider]  ELIQUIS 5 MG TABS tablet TAKE 1 TABLET BY MOUTH TWICE DAILY 06/09/17   Deboraha Sprang, MD  flecainide (TAMBOCOR) 50 MG tablet Take 1 tablet (50 mg total) every morning and take 2 tablets (100 mg total) every evening. Patient taking differently: Take 50-100 mg by mouth See admin instructions. Take 1 tablet (50 mg total) every morning and take 2 tablets (100 mg total) every evening. 05/26/17   Patsey Berthold, NP  furosemide  (LASIX) 40 MG tablet Take 1 tablet (40 mg total) by mouth daily. 07/06/17 10/04/17  Deboraha Sprang, MD  metoprolol succinate (TOPROL-XL) 50 MG 24 hr tablet Take 1 tablet (50 mg total) by mouth daily. 03/23/17   Colon Branch, MD  Multiple Vitamins-Minerals (PRESERVISION AREDS 2) CAPS Take 1 capsule by mouth daily.    [provider]  NIFEdipine (PROCARDIA XL/ADALAT-CC) 60 MG 24 hr tablet Take 1 tablet (60 mg total) by mouth daily. 06/08/17   Colon Branch, MD  omeprazole (PRILOSEC) 20 MG capsule Take 1 capsule (20 mg total) by mouth daily. 08/25/16   Colon Branch, MD  Saw Palmetto, Serenoa repens, (SAW PALMETTO PO) Take 540 mg by mouth daily.     [provider]  simvastatin (ZOCOR) 40 MG tablet Take 1 tablet (40 mg total) by mouth at bedtime. 02/23/17   Colon Branch, MD      Allergies:  Allergies  Allergen Reactions  . Cucumber Extract Nausea And Vomiting  . Sulfonamide Derivatives Other (See Comments)    "don't know reaction" per pt    Social History   Socioeconomic History  . Marital status: Widowed    Spouse name: Not on file  . Number of children: 3  . Years of education: Not on file  . Highest education level: Not on file  Occupational History  . Occupation: retired   Scientific laboratory technician  . Financial resource strain: Not on file  . Food insecurity:    Worry: Not on file    Inability: Not on file  . Transportation needs:    Medical: Not on file    Non-medical: Not on file  Tobacco Use  . Smoking status: Former Smoker    Years: 25.00    Types: Cigarettes    Last attempt to quit: 11/30/1981    Years since quitting: 35.6  . Smokeless tobacco: Never Used  Substance and Sexual Activity  . Alcohol use: Yes    Alcohol/week: 4.2 oz    Types: 7 Glasses of wine per week    Comment: 1 glass of wine or beer with dinner  . Drug use: No  . Sexual activity: Not on file  Lifestyle  . Physical activity:    Days per week: Not on file    Minutes per session: Not on file  .  Stress: Not on file  Relationships  . Social connections:    Talks on phone: Not on file    Gets together: Not on file    Attends religious service: Not on file  Active member of club or organization: Not on file    Attends meetings of clubs or organizations: Not on file    Relationship status: Not on file  . Intimate partner violence:    Fear of current or ex partner: Not on file    Emotionally abused: Not on file    Physically abused: Not on file    Forced sexual activity: Not on file  Other Topics Concern  . Not on file  Social History Narrative   Retired, single, has  a girlfriend , 3 children  (one lives in town)   Daughter has a copy of his living will     Family History  Problem Relation Age of Onset  . Heart attack Father 55  . Heart attack Mother   . Colon cancer Neg Hx   . Prostate cancer Neg Hx   . Diabetes Neg Hx      ROS:  Please see the history of present illness.     All other systems reviewed and negative.    Physical Exam: Blood pressure 118/82, pulse (!) 101, temperature 97.9 F (36.6 C), temperature source Oral, resp. rate 20, height 5\' 7"  (1.702 m), weight 205 lb (93 kg), SpO2 91 %. General: Well developed, well nourished male in no acute distress. Head: Normocephalic, atraumatic, sclera non-icteric, no xanthomas, nares are without discharge. EENT: normal Lymph Nodes:  none Back: without scoliosis/kyphosis, no CVA tendersness Neck: Negative for carotid bruits. JVD 8 Lungs: Clear bilaterally to auscultation without wheezes, rales, or rhonchi. Breathing is unlabored. Heart: Irregularly irregular rate and rhythm murmur , rubs, or gallops appreciated. Abdomen: Soft, non-tender, non-distended with normoactive bowel sounds. No hepatomegaly. No rebound/guarding. No obvious abdominal masses. Msk:  Strength and tone appear normal for age. Extremities: No clubbing or cyanosis.  2+ edema.  Distal pedal pulses are 2+ and equal bilaterally. Skin: Warm and  Dry Neuro: Alert and oriented X 3. CN III-XII intact Grossly normal sensory and motor function . Psych:  Responds to questions appropriately with a normal affect.      Labs: Cardiac Enzymes No results for input(s): CKTOTAL, CKMB, TROPONINI in the last 72 hours. CBC Lab Results  Component Value Date   WBC 10.0 07/25/2017   HGB 14.3 07/25/2017   HCT 42.0 07/25/2017   MCV 86.4 07/25/2017   PLT 293 07/25/2017   PROTIME: No results for input(s): LABPROT, INR in the last 72 hours. Chemistry  Recent Labs  Lab 07/25/17 1317 07/25/17 1346  NA 140 142  K 4.9 4.6  CL 107 106  CO2 24  --   BUN 31* 33*  CREATININE 1.80* 1.60*  CALCIUM 8.1*  --   GLUCOSE 168* 166*   Lipids Lab Results  Component Value Date   CHOL 131 02/12/2017   HDL 38.70 (L) 02/12/2017   LDLCALC 71 02/12/2017   TRIG 104.0 02/12/2017   BNP No results found for: PROBNP Thyroid Function Tests: No results for input(s): TSH, T4TOTAL, T3FREE, THYROIDAB in the last 72 hours.  Invalid input(s): FREET3    Miscellaneous No results found for: DDIMER  Radiology/Studies:  Dg Chest Portable 1 View  Result Date: 07/25/2017 CLINICAL DATA:  Tachycardia. EXAM: PORTABLE CHEST 1 VIEW COMPARISON:  Chest x-ray dated May 30, 2008. FINDINGS: The heart size and mediastinal contours are within normal limits. Normal pulmonary vascularity. Bibasilar atelectasis/scarring. No focal consolidation, pleural effusion, or pneumothorax. No acute osseous abnormality. Surgical clips in the left neck. IMPRESSION: No active disease. Electronically Signed   By:  Titus Dubin M.D.   On: 07/25/2017 13:50    EKG: Multiple rhythm strips were reviewed.  Consistent with atrial flutter with 1: 1 conduction.  Atrial cycle length .360 ms.  Left bundle branch like IVCD   Assessment and Plan:  Atrial flutter-recurrent  Atrial fibrillation treated with flecainide giving rise to the above  Congestive heart  failure-chronic-systolic  Cardiomyopathy LVEF 25-35%  Dizziness   The patient has significant symptoms of dizziness which he ascribes to the flecainide.  Alternative explanation is that it is related to the atrial flutter.  The plan had been to proceed with catheter ablation of his atrial flutter circuit allowing the flecainide to be sustained to prevent recurrences of atrial fibrillation.  Now given his concern that the flecainide is responsible for the dizziness, we will undertake an exclusion trial, as it were, by cardioverting him today.  Over the next few days, we hope to be able to answer the question whether his dizziness is more likely related to his flecainide which case it should not resolve or whether it is related to his atrial flutter in which case it should.  This would inform whether it is appropriate to consider flutter ablation in the maintaining of flecainide to prevent recurrences of arrhythmia.  In the event that is not thought to merit flutter ablation, AV junction ablation and pacing would be appropriate given his rapid rates in the cardiomyopathy which is been rapidly progressive over the last 18 months and which is attributed to his rapid rates.  He has been taking his Eliquis.      [name]

## 2017-07-25 NOTE — ED Triage Notes (Addendum)
Pt arrives from home via [facility] c/o palpitations and nausea, denies CP or recent illness. EMS reports initial HR 175, gave 150 mg amioderone bolus and 500 mL NS, HR dropped to 100.  Pt AOx4, denies CP on arrival.

## 2017-07-25 NOTE — ED Notes (Signed)
Pt ambulated in hall with no distress

## 2017-07-25 NOTE — Consult Note (Signed)
ELECTROPHYSIOLOGY CONSULT NOTE  Patient ID: Ronald [name], MRN: [id number], DOB/AGE: Jan 11, 1935 83 y.o. Admit date: 07/25/2017 Date of Consult: 07/25/2017  Primary Physician: Colon Branch, MD Primary Cardiologist: SK Egbert Seidel Steller is a 83 y.o. male who is being seen today for the evaluation of Atrial flutter  at the request of ER MD.   Chief Complaint: atrial flutter    HPI [name] is a 83 y.o. male seen in ER for tachypalpitations and found to be in Atrial Flutter with 1:1   Was given amio and HR has slowed down as  He has had complaints of dizziness and shortness of breath is   Seen in follow-up for incessant atrial arrhythmias. It was elected because of his CHADS-VASc score and the in's rate of his atrial arrhythmia to begin him on anticoagulation.    Date Cr Hgb  2/18   17.3  6/18 1.21    12/18 1.33 16.1       He has had atrial fibrillation and was treated with flecainide.  He was unable to tolerate 100 mg secondary to nausea; it was reduced to 50 mg.  Has a recurrence it has come back his flutter.  It is also been increasingly slow with an atrial cycle length of about 350 ms.  Hence, 1: 1 conduction.    DATE TEST    12/17 Echo    EF 60-65 %   1/19  Echo  EF 25-30 %   4/19 Echo  EF 25-30 %        Past Medical History:  Diagnosis Date  . BPH (benign prostatic hypertrophy)    f/u @ Biopsy, s/p bx 2009 apro (-)  . Calculus of kidney    s/p R percutaneus nephrostomy @ [facility]  . Cataract   . Colon polyp   . Complication of anesthesia    violent upon waking up  . Diabetes mellitus    pre diabetic  . Diverticulosis   . Edema leg    asymetric , L > R , [place] neg for DVT 09-2010  . GERD (gastroesophageal reflux disease)   . Hemorrhoids   . Hypercalcemia    parathyroidectomy 04/2008  . Hyperlipidemia   . Hypertension   . Iron deficiency anemia       Surgical History:  Past Surgical History:  Procedure Laterality Date  . APPENDECTOMY    .  CATARACT EXTRACTION Right   . COLONOSCOPY  12/16/2011   Procedure: COLONOSCOPY;  Surgeon: Inda Castle, MD;  Location: [facility];  Service: Endoscopy;  Laterality: N/A;  . ESOPHAGOGASTRODUODENOSCOPY N/A 01/17/2013   Procedure: ESOPHAGOGASTRODUODENOSCOPY (EGD);  Surgeon: Inda Castle, MD;  Location: [facility];  Service: Endoscopy;  Laterality: N/A;  . EXTERNAL EAR SURGERY  01/2016   BCC removal  . EYE SURGERY  infant   "lazy eye"  . FLEXIBLE SIGMOIDOSCOPY  02/09/2012   Procedure: FLEXIBLE SIGMOIDOSCOPY;  Surgeon: Inda Castle, MD;  Location: [facility];  Service: Endoscopy;  Laterality: N/A;  . HEMORRHOID BANDING  02/09/2012   Procedure: HEMORRHOID BANDING;  Surgeon: Inda Castle, MD;  Location: [facility];  Service: Endoscopy;  Laterality: N/A;  . HOT HEMOSTASIS N/A 01/17/2013   Procedure: HOT HEMOSTASIS (ARGON PLASMA COAGULATION/BICAP);  Surgeon: Inda Castle, MD;  Location: [facility];  Service: Endoscopy;  Laterality: N/A;  . KIDNEY STONE SURGERY    . PARATHYROIDECTOMY  04/2008  . TONSILLECTOMY       Home Meds: Prior to  Admission medications   Medication Sig Start Date End Date Taking? Authorizing Provider  benazepril (LOTENSIN) 20 MG tablet Take 1 tablet (20 mg total) by mouth daily. 02/18/17   Colon Branch, MD  cephALEXin (KEFLEX) 500 MG capsule Take 1 capsule (500 mg total) by mouth 4 (four) times daily. 07/08/17   Isla Pence, MD  cholecalciferol (VITAMIN D) 1000 UNITS tablet Take 1,000 Units by mouth daily.    [provider]  ELIQUIS 5 MG TABS tablet TAKE 1 TABLET BY MOUTH TWICE DAILY 06/09/17   Deboraha Sprang, MD  flecainide (TAMBOCOR) 50 MG tablet Take 1 tablet (50 mg total) every morning and take 2 tablets (100 mg total) every evening. Patient taking differently: Take 50-100 mg by mouth See admin instructions. Take 1 tablet (50 mg total) every morning and take 2 tablets (100 mg total) every evening. 05/26/17   Patsey Berthold, NP  furosemide  (LASIX) 40 MG tablet Take 1 tablet (40 mg total) by mouth daily. 07/06/17 10/04/17  Deboraha Sprang, MD  metoprolol succinate (TOPROL-XL) 50 MG 24 hr tablet Take 1 tablet (50 mg total) by mouth daily. 03/23/17   Colon Branch, MD  Multiple Vitamins-Minerals (PRESERVISION AREDS 2) CAPS Take 1 capsule by mouth daily.    [provider]  NIFEdipine (PROCARDIA XL/ADALAT-CC) 60 MG 24 hr tablet Take 1 tablet (60 mg total) by mouth daily. 06/08/17   Colon Branch, MD  omeprazole (PRILOSEC) 20 MG capsule Take 1 capsule (20 mg total) by mouth daily. 08/25/16   Colon Branch, MD  Saw Palmetto, Serenoa repens, (SAW PALMETTO PO) Take 540 mg by mouth daily.     [provider]  simvastatin (ZOCOR) 40 MG tablet Take 1 tablet (40 mg total) by mouth at bedtime. 02/23/17   Colon Branch, MD      Allergies:  Allergies  Allergen Reactions  . Cucumber Extract Nausea And Vomiting  . Sulfonamide Derivatives Other (See Comments)    "don't know reaction" per pt    Social History   Socioeconomic History  . Marital status: Widowed    Spouse name: Not on file  . Number of children: 3  . Years of education: Not on file  . Highest education level: Not on file  Occupational History  . Occupation: retired   Scientific laboratory technician  . Financial resource strain: Not on file  . Food insecurity:    Worry: Not on file    Inability: Not on file  . Transportation needs:    Medical: Not on file    Non-medical: Not on file  Tobacco Use  . Smoking status: Former Smoker    Years: 25.00    Types: Cigarettes    Last attempt to quit: 11/30/1981    Years since quitting: 35.6  . Smokeless tobacco: Never Used  Substance and Sexual Activity  . Alcohol use: Yes    Alcohol/week: 4.2 oz    Types: 7 Glasses of wine per week    Comment: 1 glass of wine or beer with dinner  . Drug use: No  . Sexual activity: Not on file  Lifestyle  . Physical activity:    Days per week: Not on file    Minutes per session: Not on file  .  Stress: Not on file  Relationships  . Social connections:    Talks on phone: Not on file    Gets together: Not on file    Attends religious service: Not on file  Active member of club or organization: Not on file    Attends meetings of clubs or organizations: Not on file    Relationship status: Not on file  . Intimate partner violence:    Fear of current or ex partner: Not on file    Emotionally abused: Not on file    Physically abused: Not on file    Forced sexual activity: Not on file  Other Topics Concern  . Not on file  Social History Narrative   Retired, single, has  a girlfriend , 3 children  (one lives in town)   Daughter has a copy of his living will     Family History  Problem Relation Age of Onset  . Heart attack Father 52  . Heart attack Mother   . Colon cancer Neg Hx   . Prostate cancer Neg Hx   . Diabetes Neg Hx      ROS:  Please see the history of present illness.     All other systems reviewed and negative.    Physical Exam: Blood pressure 118/82, pulse (!) 101, temperature 97.9 F (36.6 C), temperature source Oral, resp. rate 20, height 5\' 7"  (1.702 m), weight 205 lb (93 kg), SpO2 91 %. General: Well developed, well nourished male in no acute distress. Head: Normocephalic, atraumatic, sclera non-icteric, no xanthomas, nares are without discharge. EENT: normal Lymph Nodes:  none Back: without scoliosis/kyphosis, no CVA tendersness Neck: Negative for carotid bruits. JVD 8 Lungs: Clear bilaterally to auscultation without wheezes, rales, or rhonchi. Breathing is unlabored. Heart: Irregularly irregular rate and rhythm murmur , rubs, or gallops appreciated. Abdomen: Soft, non-tender, non-distended with normoactive bowel sounds. No hepatomegaly. No rebound/guarding. No obvious abdominal masses. Msk:  Strength and tone appear normal for age. Extremities: No clubbing or cyanosis.  2+ edema.  Distal pedal pulses are 2+ and equal bilaterally. Skin: Warm and  Dry Neuro: Alert and oriented X 3. CN III-XII intact Grossly normal sensory and motor function . Psych:  Responds to questions appropriately with a normal affect.      Labs: Cardiac Enzymes No results for input(s): CKTOTAL, CKMB, TROPONINI in the last 72 hours. CBC Lab Results  Component Value Date   WBC 10.0 07/25/2017   HGB 14.3 07/25/2017   HCT 42.0 07/25/2017   MCV 86.4 07/25/2017   PLT 293 07/25/2017   PROTIME: No results for input(s): LABPROT, INR in the last 72 hours. Chemistry  Recent Labs  Lab 07/25/17 1317 07/25/17 1346  NA 140 142  K 4.9 4.6  CL 107 106  CO2 24  --   BUN 31* 33*  CREATININE 1.80* 1.60*  CALCIUM 8.1*  --   GLUCOSE 168* 166*   Lipids Lab Results  Component Value Date   CHOL 131 02/12/2017   HDL 38.70 (L) 02/12/2017   LDLCALC 71 02/12/2017   TRIG 104.0 02/12/2017   BNP No results found for: PROBNP Thyroid Function Tests: No results for input(s): TSH, T4TOTAL, T3FREE, THYROIDAB in the last 72 hours.  Invalid input(s): FREET3    Miscellaneous No results found for: DDIMER  Radiology/Studies:  Dg Chest Portable 1 View  Result Date: 07/25/2017 CLINICAL DATA:  Tachycardia. EXAM: PORTABLE CHEST 1 VIEW COMPARISON:  Chest x-ray dated May 30, 2008. FINDINGS: The heart size and mediastinal contours are within normal limits. Normal pulmonary vascularity. Bibasilar atelectasis/scarring. No focal consolidation, pleural effusion, or pneumothorax. No acute osseous abnormality. Surgical clips in the left neck. IMPRESSION: No active disease. Electronically Signed   By:  Titus Dubin M.D.   On: 07/25/2017 13:50    EKG: Multiple rhythm strips were reviewed.  Consistent with atrial flutter with 1: 1 conduction.  Atrial cycle length .360 ms.  Left bundle branch like IVCD   Assessment and Plan:  Atrial flutter-recurrent  Atrial fibrillation treated with flecainide giving rise to the above  Congestive heart  failure-chronic-systolic  Cardiomyopathy LVEF 25-35%  Dizziness   The patient has significant symptoms of dizziness which he ascribes to the flecainide.  Alternative explanation is that it is related to the atrial flutter.  The plan had been to proceed with catheter ablation of his atrial flutter circuit allowing the flecainide to be sustained to prevent recurrences of atrial fibrillation.  Now given his concern that the flecainide is responsible for the dizziness, we will undertake an exclusion trial, as it were, by cardioverting him today.  Over the next few days, we hope to be able to answer the question whether his dizziness is more likely related to his flecainide which case it should not resolve or whether it is related to his atrial flutter in which case it should.  This would inform whether it is appropriate to consider flutter ablation in the maintaining of flecainide to prevent recurrences of arrhythmia.  In the event that is not thought to merit flutter ablation, AV junction ablation and pacing would be appropriate given his rapid rates in the cardiomyopathy which is been rapidly progressive over the last 18 months and which is attributed to his rapid rates.  He has been taking his Eliquis.      [name]

## 2017-07-25 NOTE — Progress Notes (Signed)
  Amiodarone Drug - Drug Interaction Consult Note  Recommendations:  N/A  Amiodarone is metabolized by the cytochrome P450 system and therefore has the potential to cause many drug interactions. Amiodarone has an average plasma half-life of 50 days (range 20 to 100 days).   There is potential for drug interactions to occur several weeks or months after stopping treatment and the onset of drug interactions may be slow after initiating amiodarone.   []  Statins: Increased risk of myopathy. Simvastatin- restrict dose to 20mg  daily. Other statins: counsel patients to report any muscle pain or weakness immediately.  []  Anticoagulants: Amiodarone can increase anticoagulant effect. Consider warfarin dose reduction. Patients should be monitored closely and the dose of anticoagulant altered accordingly, remembering that amiodarone levels take several weeks to stabilize.  []  Antiepileptics: Amiodarone can increase plasma concentration of phenytoin, the dose should be reduced. Note that small changes in phenytoin dose can result in large changes in levels. Monitor patient and counsel on signs of toxicity.  []  Beta blockers: increased risk of bradycardia, AV block and myocardial depression. Sotalol - avoid concomitant use.  []   Calcium channel blockers (diltiazem and verapamil): increased risk of bradycardia, AV block and myocardial depression.  []   Cyclosporine: Amiodarone increases levels of cyclosporine. Reduced dose of cyclosporine is recommended.  []  Digoxin dose should be halved when amiodarone is started.  []  Diuretics: increased risk of cardiotoxicity if hypokalemia occurs.  []  Oral hypoglycemic agents (glyburide, glipizide, glimepiride): increased risk of hypoglycemia. Patient's glucose levels should be monitored closely when initiating amiodarone therapy.   []  Drugs that prolong the QT interval:  Torsades de pointes risk may be increased with concurrent use - avoid if possible.  Monitor QTc,  also keep magnesium/potassium WNL if concurrent therapy can't be avoided. Marland Kitchen Antibiotics: e.g. fluoroquinolones, erythromycin. . Antiarrhythmics: e.g. quinidine, procainamide, disopyramide, sotalol. . Antipsychotics: e.g. phenothiazines, haloperidol.  . Lithium, tricyclic antidepressants, and methadone. Thank You,  Remigio Eisenmenger D. Mina Marble, PharmD, BCPS, [place] Pager:  [phone] 07/25/2017, 1:33 PM

## 2017-07-25 NOTE — ED Provider Notes (Signed)
[facility] EMERGENCY DEPARTMENT Provider Note   CSN: [id number] Arrival date & time:        History   Chief Complaint Chief Complaint  Patient presents with  . Tachycardia    HPI Ronald [name] is a 83 y.o. male.  HPI   83 yo M with PMHx AFIb, followed by Dr. Caryl Comes, here with tachycardia. Pt states that about 2 hours ago, he noticed acute onset of palpitations, lightheadedness, and nausea. He has a h/o VTach and AFib RVR with similar sx. His girlfriend checked his pulse and it was in the 170s. He became lightheaded and called EMS. On EMS arrival, pt was in wide-complex tahcycardia at rate 170s. He was given amio 150 bolus and converted just prior to arrival. He feels improved. Denies any CP, SOB at this time. No recent med changes. He has been taking his meds as prescribed. No recent illnesses or fevers. He does state he was exerting himself cleaning the house when sx started. He's been compliant with his anticoagulation.  Past Medical History:  Diagnosis Date  . BPH (benign prostatic hypertrophy)    f/u @ Biopsy, s/p bx 2009 apro (-)  . Calculus of kidney    s/p R percutaneus nephrostomy @ [facility]  . Cataract   . Colon polyp   . Complication of anesthesia    violent upon waking up  . Diabetes mellitus    pre diabetic  . Diverticulosis   . Edema leg    asymetric , L > R , [place] neg for DVT 09-2010  . GERD (gastroesophageal reflux disease)   . Hemorrhoids   . Hypercalcemia    parathyroidectomy 04/2008  . Hyperlipidemia   . Hypertension   . Iron deficiency anemia     Patient Active Problem List   Diagnosis Date Noted  . Atrial flutter ([place]) 08/09/2016  . PCP NOTES >>>>>>>>>>>>> 12/02/2014  . Iron deficiency anemia, unspecified 01/17/2013  . 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 Personal history of colonic polyps 12/01/2011  . Annual physical exam 09/23/2010  . Elevated PSA 04/12/2008  . ERECTILE DYSFUNCTION 05/25/2007  . DMII (diabetes  mellitus, type 2) ([place]) 03/02/2007  . CALCULUS OF KIDNEY 02/15/2007  . HYPERPARATHYROIDISM UNSPECIFIED 11/16/2006  . HYPERLIPIDEMIA 11/11/2006  . Essential hypertension 11/11/2006  . GERD 11/11/2006    Past Surgical History:  Procedure Laterality Date  . APPENDECTOMY    . CATARACT EXTRACTION Right   . COLONOSCOPY  12/16/2011   Procedure: COLONOSCOPY;  Surgeon: Inda Castle, MD;  Location: [facility] ENDOSCOPY;  Service: Endoscopy;  Laterality: N/A;  . ESOPHAGOGASTRODUODENOSCOPY N/A 01/17/2013   Procedure: ESOPHAGOGASTRODUODENOSCOPY (EGD);  Surgeon: Inda Castle, MD;  Location: [facility];  Service: Endoscopy;  Laterality: N/A;  . EXTERNAL EAR SURGERY  01/2016   BCC removal  . EYE SURGERY  infant   "lazy eye"  . FLEXIBLE SIGMOIDOSCOPY  02/09/2012   Procedure: FLEXIBLE SIGMOIDOSCOPY;  Surgeon: Inda Castle, MD;  Location: [facility] ENDOSCOPY;  Service: Endoscopy;  Laterality: N/A;  . HEMORRHOID BANDING  02/09/2012   Procedure: HEMORRHOID BANDING;  Surgeon: Inda Castle, MD;  Location: [facility] ENDOSCOPY;  Service: Endoscopy;  Laterality: N/A;  . HOT HEMOSTASIS N/A 01/17/2013   Procedure: HOT HEMOSTASIS (ARGON PLASMA COAGULATION/BICAP);  Surgeon: Inda Castle, MD;  Location: [facility];  Service: Endoscopy;  Laterality: N/A;  . KIDNEY STONE SURGERY    . PARATHYROIDECTOMY  04/2008  . TONSILLECTOMY  Home Medications    Prior to Admission medications   Medication Sig Start Date End Date Taking? Authorizing Provider  benazepril (LOTENSIN) 20 MG tablet Take 1 tablet (20 mg total) by mouth daily. 02/18/17   Colon Branch, MD  cephALEXin (KEFLEX) 500 MG capsule Take 1 capsule (500 mg total) by mouth 4 (four) times daily. 07/08/17   Isla Pence, MD  cholecalciferol (VITAMIN D) 1000 UNITS tablet Take 1,000 Units by mouth daily.    [provider]  ELIQUIS 5 MG TABS tablet TAKE 1 TABLET BY MOUTH TWICE DAILY 06/09/17   Deboraha Sprang, MD  flecainide (TAMBOCOR) 50 MG tablet Take  1 tablet (50 mg total) every morning and take 2 tablets (100 mg total) every evening. Patient taking differently: Take 50-100 mg by mouth See admin instructions. Take 1 tablet (50 mg total) every morning and take 2 tablets (100 mg total) every evening. 05/26/17   Patsey Berthold, NP  furosemide (LASIX) 40 MG tablet Take 1 tablet (40 mg total) by mouth daily. 07/06/17 10/04/17  Deboraha Sprang, MD  metoprolol succinate (TOPROL-XL) 50 MG 24 hr tablet Take 1 tablet (50 mg total) by mouth daily. 03/23/17   Colon Branch, MD  Multiple Vitamins-Minerals (PRESERVISION AREDS 2) CAPS Take 1 capsule by mouth daily.    [provider]  NIFEdipine (PROCARDIA XL/ADALAT-CC) 60 MG 24 hr tablet Take 1 tablet (60 mg total) by mouth daily. 06/08/17   Colon Branch, MD  omeprazole (PRILOSEC) 20 MG capsule Take 1 capsule (20 mg total) by mouth daily. 08/25/16   Colon Branch, MD  Saw Palmetto, Serenoa repens, (SAW PALMETTO PO) Take 540 mg by mouth daily.     [provider]  simvastatin (ZOCOR) 40 MG tablet Take 1 tablet (40 mg total) by mouth at bedtime. 02/23/17   Colon Branch, MD    Family History Family History  Problem Relation Age of Onset  . Heart attack Father 71  . Heart attack Mother   . Colon cancer Neg Hx   . Prostate cancer Neg Hx   . Diabetes Neg Hx     Social History Social History   Tobacco Use  . Smoking status: Former Smoker    Years: 25.00    Types: Cigarettes    Last attempt to quit: 11/30/1981    Years since quitting: 35.6  . Smokeless tobacco: Never Used  Substance Use Topics  . Alcohol use: Yes    Alcohol/week: 4.2 oz    Types: 7 Glasses of wine per week    Comment: 1 glass of wine or beer with dinner  . Drug use: No     Allergies   Cucumber extract and Sulfonamide derivatives   Review of Systems Review of Systems  Constitutional: Positive for fatigue.  Respiratory: Positive for chest tightness and shortness of breath.   Cardiovascular: Positive for chest pain  and palpitations.  All other systems reviewed and are negative.    Physical Exam Updated Vital Signs BP 140/89   Pulse 73   Temp 97.9 F (36.6 C) (Oral)   Resp 18   Ht 5\' 7"  (1.702 m)   Wt 93 kg (205 lb)   SpO2 98%   BMI 32.11 kg/m   Physical Exam  Constitutional: He is oriented to person, place, and time. He appears well-developed and well-nourished. No distress.  HENT:  Head: Normocephalic and atraumatic.  Mouth/Throat: Oropharynx is clear and moist.  Eyes: Conjunctivae are normal.  Neck:  Neck supple.  Cardiovascular: Normal heart sounds. An irregularly irregular rhythm present. Tachycardia present. Exam reveals no friction rub.  No murmur heard. Pulmonary/Chest: Effort normal and breath sounds normal. No respiratory distress. He has no wheezes. He has no rales.  Abdominal: He exhibits no distension.  Musculoskeletal: He exhibits no edema.  Neurological: He is alert and oriented to person, place, and time. He exhibits normal muscle tone.  Skin: Skin is warm. Capillary refill takes less than 2 seconds.  Psychiatric: He has a normal mood and affect.  Nursing note and vitals reviewed.    ED Treatments / Results  Labs (all labs ordered are listed, but only abnormal results are displayed) Labs Reviewed  BASIC METABOLIC PANEL - Abnormal; Notable for the following components:      Result Value   Glucose, Bld 168 (*)    BUN 31 (*)    Creatinine, Ser 1.80 (*)    Calcium 8.1 (*)    GFR calc non Af Amer 34 (*)    GFR calc Af Amer 39 (*)    All other components within normal limits  I-STAT CHEM 8, ED - Abnormal; Notable for the following components:   BUN 33 (*)    Creatinine, Ser 1.60 (*)    Glucose, Bld 166 (*)    Calcium, Ion 1.02 (*)    All other components within normal limits  CBC  TROPONIN I  PROTIME-INR  BRAIN NATRIURETIC PEPTIDE  I-STAT TROPONIN, ED    EKG EKG Interpretation  Date/Time:  Saturday Jul 25 2017 13:15:54 EDT Ventricular Rate:  124 PR  Interval:    QRS Duration: 161 QT Interval:  404 QTC Calculation: 495 R Axis:   -47 Text Interpretation:  Atrial fibrillation Ventricular tachycardia, unsustained RBBB and LAFB Since last EKG, AFib has replaced sinus rhythm with arrhythmia Confirmed by Duffy Bruce [phone]) on 07/25/2017 2:17:24 PM           Radiology Dg Chest Portable 1 View  Result Date: 07/25/2017 CLINICAL DATA:  Tachycardia. EXAM: PORTABLE CHEST 1 VIEW COMPARISON:  Chest x-ray dated May 30, 2008. FINDINGS: The heart size and mediastinal contours are within normal limits. Normal pulmonary vascularity. Bibasilar atelectasis/scarring. No focal consolidation, pleural effusion, or pneumothorax. No acute osseous abnormality. Surgical clips in the left neck. IMPRESSION: No active disease. Electronically Signed   By: Titus Dubin M.D.   On: 07/25/2017 13:50    Procedures .Critical Care Performed by: Duffy Bruce, MD Authorized by: Duffy Bruce, MD   Critical care provider statement:    Critical care time (minutes):  45   Critical care time was exclusive of:  Separately billable procedures and treating other patients and teaching time   Critical care was necessary to treat or prevent imminent or life-threatening deterioration of the following conditions:  Cardiac failure and circulatory failure   Critical care was time spent personally by me on the following activities:  Development of treatment plan with patient or surrogate, discussions with consultants, evaluation of patient's response to treatment, examination of patient, obtaining history from patient or surrogate, ordering and performing treatments and interventions, ordering and review of laboratory studies, ordering and review of radiographic studies, pulse oximetry, re-evaluation of patient's condition and review of old charts   I assumed direction of critical care for this patient from another provider in my specialty: no   .Cardioversion Date/Time:  07/25/2017 6:17 PM Performed by: Duffy Bruce, MD Authorized by: Duffy Bruce, MD   Consent:    Consent obtained:  Written  Consent given by:  Patient   Risks discussed:  Death, pain, induced arrhythmia and cutaneous burn   Alternatives discussed:  Rate-control medication and alternative treatment Pre-procedure details:    Cardioversion basis:  Emergent   Rhythm:  Atrial fibrillation   Electrode placement:  Anterior-posterior Patient sedated: Yes. Refer to sedation procedure documentation for details of sedation.  Attempt one:    Cardioversion mode:  Synchronous   Waveform:  Biphasic   Shock (Joules):  100   Shock outcome:  Conversion to normal sinus rhythm Post-procedure details:    Patient status:  Awake   Patient tolerance of procedure:  Tolerated well, no immediate complications .Sedation Date/Time: 07/25/2017 6:18 PM Performed by: Duffy Bruce, MD Authorized by: Duffy Bruce, MD   Consent:    Consent obtained:  Verbal   Consent given by:  Patient   Risks discussed:  Allergic reaction, dysrhythmia, inadequate sedation, nausea, prolonged hypoxia resulting in organ damage, prolonged sedation necessitating reversal, respiratory compromise necessitating ventilatory assistance and intubation and vomiting   Alternatives discussed:  Analgesia without sedation, anxiolysis and regional anesthesia Universal protocol:    Procedure explained and questions answered to patient or proxy's satisfaction: yes     Relevant documents present and verified: yes     Test results available and properly labeled: yes     Imaging studies available: yes     Required blood products, implants, devices, and special equipment available: yes     Site/side marked: yes     Immediately prior to procedure a time out was called: yes     Patient identity confirmation method:  Verbally with patient Indications:    Procedure necessitating sedation performed by:  Physician performing sedation    Intended level of sedation:  Deep Pre-sedation assessment:    Time since last food or drink:  6   ASA classification: class 2 - patient with mild systemic disease     Neck mobility: normal     Mouth opening:  3 or more finger widths   Thyromental distance:  4 finger widths   Mallampati score:  I - soft palate, uvula, fauces, pillars visible   Pre-sedation assessments completed and reviewed: airway patency, cardiovascular function, hydration status, mental status, nausea/vomiting, pain level, respiratory function and temperature   Immediate pre-procedure details:    Reassessment: Patient reassessed immediately prior to procedure     Reviewed: vital signs, relevant labs/tests and NPO status     Verified: bag valve mask available, emergency equipment available, intubation equipment available, IV patency confirmed, oxygen available and suction available   Procedure details (see MAR for exact dosages):    Preoxygenation:  Nasal cannula   Sedation:  Propofol   Intra-procedure monitoring:  Blood pressure monitoring, cardiac monitor, continuous pulse oximetry, frequent LOC assessments, frequent vital sign checks and continuous capnometry   Intra-procedure events: none     Total Provider sedation time (minutes):  25 Post-procedure details:    Attendance: Constant attendance by certified staff until patient recovered     Recovery: Patient returned to pre-procedure baseline     Post-sedation assessments completed and reviewed: airway patency, cardiovascular function, hydration status, mental status, nausea/vomiting, pain level, respiratory function and temperature     Patient is stable for discharge or admission: yes     Patient tolerance:  Tolerated well, no immediate complications   (including critical care time)  Medications Ordered in ED Medications  amiodarone (NEXTERONE PREMIX) 360-4.14 MG/200ML-% (1.8 mg/mL) IV infusion (0 mg/hr Intravenous Stopped 07/25/17 1455)  Followed by  amiodarone  (NEXTERONE PREMIX) 360-4.14 MG/200ML-% (1.8 mg/mL) IV infusion (has no administration in time range)  amiodarone (NEXTERONE) 1.8 mg/mL load via infusion 150 mg (150 mg Intravenous Bolus from Bag 07/25/17 1342)  sodium chloride 0.9 % bolus 1,000 mL (0 mLs Intravenous Stopped 07/25/17 1601)  propofol (DIPRIVAN) 10 mg/mL bolus/IV push 46.5 mg (46.5 mg Intravenous Given 07/25/17 1512)     Initial Impression / Assessment and Plan / ED Course  I have reviewed the triage vital signs and the nursing notes.  Pertinent labs & imaging results that were available during my care of the patient were reviewed by me and considered in my medical decision making (see chart for details).     83 yo M here with reported VTach. Continued on Amio bolus and gtt on arrival. Lab work shows mild prerenal injury, likely 2/2 poor perfusion from rhythm change. Pt has well documented h/o same. CXr w/o PNA or edema. Pt awake, alert on arrival. Discussed w/ Dr. Caryl Comes, [facility] who evaluated pt. Pt is compliant with his anticoagulation and Dr. Caryl Comes suspects this was AFlutter rather than VTach. Dr. Caryl Comes recommends cardioversion and d/c.  Pt subsequently consented, sedated, and cardioverted by myself. Tolerated very well. Neurologically intact post cardioversion. He was ambulate, tolerated PO, and repeat EKG confirms persistent sinus rhythm. Will d/c per Cards.  Final Clinical Impressions(s) / ED Diagnoses   Final diagnoses:  Atrial flutter, unspecified type [facility])    ED Discharge Orders    None       Duffy Bruce, MD 07/25/17 [facility] Emerald

## 2017-07-26 ENCOUNTER — Encounter: Payer: Self-pay | Admitting: Internal Medicine

## 2017-07-28 ENCOUNTER — Encounter: Payer: Self-pay | Admitting: Internal Medicine

## 2017-07-29 NOTE — Telephone Encounter (Signed)
Pt understands Dr Caryl Comes would like to hear from him on Friday 5/24 regarding his symptoms with flecainide vs Aflutter. Dr Caryl Comes advised it was ok for him to travel out of town this weekend as well. Pt verbalized understanding and agreed to plan.

## 2017-07-31 ENCOUNTER — Encounter: Payer: Self-pay | Admitting: Internal Medicine

## 2017-08-10 ENCOUNTER — Encounter: Payer: Self-pay | Admitting: Internal Medicine

## 2017-08-11 NOTE — Anesthesia Preprocedure Evaluation (Addendum)
Anesthesia Evaluation  Patient identified by MRN, date of birth, ID band Patient awake    Reviewed: Allergy & Precautions, H&P , NPO status , Patient's Chart, lab work & pertinent test results, reviewed documented beta blocker date and time   Airway Mallampati: III  TM Distance: >3 FB Neck ROM: Full    Dental  (+) Edentulous Upper, Partial Lower Missing many lower front teeth:   Pulmonary former smoker,    Pulmonary exam normal breath sounds clear to auscultation       Cardiovascular Exercise Tolerance: Good hypertension, Pt. on medications and Pt. on home beta blockers + dysrhythmias Atrial Fibrillation  Rhythm:Regular Rate:Normal  '19 TTE - Mild LVH. EF 25% to 30%. Diffuse hypokinesis. Mild MR with mild LA dilatation. Mildly dilated RA.    Neuro/Psych negative neurological ROS  negative psych ROS   GI/Hepatic Neg liver ROS, GERD  Medicated and Controlled,  Endo/Other  diabetes, Well ControlledObesity  Renal/GU negative Renal ROS  negative genitourinary   Musculoskeletal   Abdominal   Peds  Hematology negative hematology ROS (+)   Anesthesia Other Findings   Reproductive/Obstetrics negative OB ROS                            Anesthesia Physical  Anesthesia Plan  ASA: III  Anesthesia Plan: General   Post-op Pain Management:    Induction: Intravenous  PONV Risk Score and Plan: Treatment may vary due to age or medical condition, Ondansetron and Dexamethasone  Airway Management Planned: LMA  Additional Equipment: None  Intra-op Plan:   Post-operative Plan: Extubation in OR  Informed Consent: I have reviewed the patients History and Physical, chart, labs and discussed the procedure including the risks, benefits and alternatives for the proposed anesthesia with the patient or authorized representative who has indicated his/her understanding and acceptance.   Dental advisory  given  Plan Discussed with: CRNA, Anesthesiologist and Surgeon  Anesthesia Plan Comments: (Surgeon requesting general anesthetic)       Anesthesia Quick Evaluation

## 2017-08-12 ENCOUNTER — Ambulatory Visit (HOSPITAL_COMMUNITY): Payer: PPO | Admitting: Anesthesiology

## 2017-08-12 ENCOUNTER — Encounter (HOSPITAL_COMMUNITY): Payer: Self-pay | Admitting: Internal Medicine

## 2017-08-12 ENCOUNTER — Ambulatory Visit (HOSPITAL_COMMUNITY)
Admission: RE | Admit: 2017-08-12 | Discharge: 2017-08-12 | Disposition: A | Discharge status: Home / Self Care | Payer: PPO | Source: Ambulatory Visit | Attending: Internal Medicine | Admitting: Internal Medicine

## 2017-08-12 ENCOUNTER — Ambulatory Visit (HOSPITAL_COMMUNITY)
Admission: RE | Disposition: A | Discharge status: Home / Self Care | Payer: Self-pay | Source: Ambulatory Visit | Attending: Internal Medicine

## 2017-08-12 DIAGNOSIS — I4891 Unspecified atrial fibrillation: Secondary | ICD-10-CM | POA: Insufficient documentation

## 2017-08-12 DIAGNOSIS — Z8249 Family history of ischemic heart disease and other diseases of the circulatory system: Secondary | ICD-10-CM | POA: Insufficient documentation

## 2017-08-12 DIAGNOSIS — Z7901 Long term (current) use of anticoagulants: Secondary | ICD-10-CM | POA: Diagnosis not present

## 2017-08-12 DIAGNOSIS — I110 Hypertensive heart disease with heart failure: Secondary | ICD-10-CM | POA: Diagnosis not present

## 2017-08-12 DIAGNOSIS — I429 Cardiomyopathy, unspecified: Secondary | ICD-10-CM | POA: Diagnosis not present

## 2017-08-12 DIAGNOSIS — R7303 Prediabetes: Secondary | ICD-10-CM | POA: Diagnosis not present

## 2017-08-12 DIAGNOSIS — I483 Typical atrial flutter: Secondary | ICD-10-CM | POA: Diagnosis not present

## 2017-08-12 DIAGNOSIS — Z882 Allergy status to sulfonamides status: Secondary | ICD-10-CM | POA: Diagnosis not present

## 2017-08-12 DIAGNOSIS — Z6831 Body mass index (BMI) 31.0-31.9, adult: Secondary | ICD-10-CM | POA: Diagnosis not present

## 2017-08-12 DIAGNOSIS — Z87891 Personal history of nicotine dependence: Secondary | ICD-10-CM | POA: Insufficient documentation

## 2017-08-12 DIAGNOSIS — E669 Obesity, unspecified: Secondary | ICD-10-CM | POA: Diagnosis not present

## 2017-08-12 DIAGNOSIS — I5022 Chronic systolic (congestive) heart failure: Secondary | ICD-10-CM | POA: Diagnosis not present

## 2017-08-12 DIAGNOSIS — R42 Dizziness and giddiness: Secondary | ICD-10-CM | POA: Diagnosis not present

## 2017-08-12 DIAGNOSIS — E785 Hyperlipidemia, unspecified: Secondary | ICD-10-CM | POA: Diagnosis not present

## 2017-08-12 DIAGNOSIS — E119 Type 2 diabetes mellitus without complications: Secondary | ICD-10-CM | POA: Diagnosis not present

## 2017-08-12 DIAGNOSIS — I10 Essential (primary) hypertension: Secondary | ICD-10-CM | POA: Diagnosis not present

## 2017-08-12 DIAGNOSIS — I4892 Unspecified atrial flutter: Secondary | ICD-10-CM | POA: Diagnosis not present

## 2017-08-12 DIAGNOSIS — K219 Gastro-esophageal reflux disease without esophagitis: Secondary | ICD-10-CM | POA: Insufficient documentation

## 2017-08-12 HISTORY — PX: A-FLUTTER ABLATION: EP1230

## 2017-08-12 LAB — GLUCOSE, CAPILLARY
GLUCOSE-CAPILLARY: 97 mg/dL (ref 65–99)
Glucose-Capillary: 107 mg/dL — ABNORMAL HIGH (ref 65–99)

## 2017-08-12 SURGERY — A-FLUTTER ABLATION
Anesthesia: Monitor Anesthesia Care

## 2017-08-12 MED ORDER — HEPARIN (PORCINE) IN NACL 1000-0.9 UT/500ML-% IV SOLN
INTRAVENOUS | Status: AC
  Filled 2017-08-12: qty 500

## 2017-08-12 MED ORDER — BUPIVACAINE HCL (PF) 0.25 % IJ SOLN
INTRAMUSCULAR | Status: AC
  Filled 2017-08-12: qty 30

## 2017-08-12 MED ORDER — ONDANSETRON HCL 4 MG/2ML IJ SOLN: 4.0000 mg | Freq: Four times a day (QID) | INTRAMUSCULAR | Status: DC | PRN

## 2017-08-12 MED ORDER — EPHEDRINE SULFATE 50 MG/ML IJ SOLN
INTRAMUSCULAR | Status: DC | PRN
  Administered 2017-08-12 (×2): 5 mg via INTRAVENOUS
  Administered 2017-08-12: 10 mg via INTRAVENOUS

## 2017-08-12 MED ORDER — BUPIVACAINE HCL (PF) 0.25 % IJ SOLN
INTRAMUSCULAR | Status: DC | PRN
  Administered 2017-08-12: 30 mL

## 2017-08-12 MED ORDER — SODIUM CHLORIDE 0.9% FLUSH: 3.0000 mL | INTRAVENOUS | Status: DC | PRN

## 2017-08-12 MED ORDER — FENTANYL CITRATE (PF) 100 MCG/2ML IJ SOLN
INTRAMUSCULAR | Status: DC | PRN
  Administered 2017-08-12 (×2): 25 ug via INTRAVENOUS
  Administered 2017-08-12: 50 ug via INTRAVENOUS

## 2017-08-12 MED ORDER — PHENYLEPHRINE HCL 10 MG/ML IJ SOLN
INTRAMUSCULAR | Status: DC | PRN
  Administered 2017-08-12: 40 ug via INTRAVENOUS
  Administered 2017-08-12: 80 ug via INTRAVENOUS
  Administered 2017-08-12: 40 ug via INTRAVENOUS

## 2017-08-12 MED ORDER — PROPOFOL 10 MG/ML IV BOLUS
INTRAVENOUS | Status: DC | PRN
  Administered 2017-08-12: 100 mg via INTRAVENOUS

## 2017-08-12 MED ORDER — SODIUM CHLORIDE 0.9 % IV SOLN: INTRAVENOUS | Status: DC

## 2017-08-12 MED ORDER — LIDOCAINE 2% (20 MG/ML) 5 ML SYRINGE
INTRAMUSCULAR | Status: DC | PRN
  Administered 2017-08-12: 80 mg via INTRAVENOUS

## 2017-08-12 MED ORDER — HEPARIN (PORCINE) IN NACL 2-0.9 UNITS/ML
INTRAMUSCULAR | Status: AC | PRN
  Administered 2017-08-12: 1000 mL

## 2017-08-12 MED ORDER — SODIUM CHLORIDE 0.9% FLUSH: 3.0000 mL | Freq: Two times a day (BID) | INTRAVENOUS | Status: DC

## 2017-08-12 MED ORDER — ACETAMINOPHEN 325 MG PO TABS
650.0000 mg | ORAL_TABLET | ORAL | Status: DC | PRN
  Filled 2017-08-12: qty 2

## 2017-08-12 MED ORDER — SODIUM CHLORIDE 0.9 % IV SOLN: 250.0000 mL | INTRAVENOUS | Status: DC | PRN

## 2017-08-12 MED ORDER — ONDANSETRON HCL 4 MG/2ML IJ SOLN
INTRAMUSCULAR | Status: DC | PRN
  Administered 2017-08-12: 4 mg via INTRAVENOUS

## 2017-08-12 MED ORDER — SODIUM CHLORIDE 0.9 % IV SOLN
INTRAVENOUS | Status: DC
  Administered 2017-08-12: 07:00:00 via INTRAVENOUS

## 2017-08-12 SURGICAL SUPPLY — 13 items
BAG SNAP BAND KOVER 36X36 (MISCELLANEOUS) ×3 IMPLANT
CATH BLAZERPRIME XP LG CV 10MM (ABLATOR) ×3 IMPLANT
CATH DUODECA HALO/ISMUS 7FR (CATHETERS) ×3 IMPLANT
CATH OCTAPOLOR 6F 125CM 2-5-2 (CATHETERS) ×3 IMPLANT
CATH QUAD COURNAND 5FR (CATHETERS) ×3 IMPLANT
PACK EP LATEX FREE (CUSTOM PROCEDURE TRAY) ×2
PACK EP LF (CUSTOM PROCEDURE TRAY) ×1 IMPLANT
PAD DEFIB LIFELINK (PAD) ×3 IMPLANT
SHEATH ATRIAL FLUTTER SAFL 8F (SHEATH) ×3 IMPLANT
SHEATH AVANTI 11CM 7FR (SHEATH) ×3 IMPLANT
SHEATH PINNACLE 6F 10CM (SHEATH) ×3 IMPLANT
SHEATH PINNACLE 8F 10CM (SHEATH) ×6 IMPLANT
SHIELD RADPAD SCOOP 12X17 (MISCELLANEOUS) ×3 IMPLANT

## 2017-08-12 NOTE — Transfer of Care (Signed)
Immediate Anesthesia Transfer of Care Note  Patient: Ronald [name]  Procedure(s) Performed: A-FLUTTER ABLATION (N/A )  Patient Location: Cath Lab  Anesthesia Type:General  Level of Consciousness: awake, alert  and oriented  Airway & Oxygen Therapy: Patient Spontanous Breathing and Patient connected to nasal cannula oxygen  Post-op Assessment: Report given to RN, Post -op Vital signs reviewed and stable and Patient moving all extremities  Post vital signs: Reviewed and stable  Last Vitals:  Vitals Value Taken Time  BP    Temp    Pulse 61 08/12/2017  9:47 AM  Resp    SpO2 92 % 08/12/2017  9:47 AM  Vitals shown include unvalidated device data.  Last Pain:  Vitals:   08/12/17 0618  TempSrc:   PainSc: 0-No pain      Patients Stated Pain Goal: 3 (62/[id number]/38 [id number])  Complications: No apparent anesthesia complications

## 2017-08-12 NOTE — Interval H&P Note (Signed)
History and Physical Interval Note:  08/12/2017 7:59 AM  Ronald [name]  has presented today for surgery, with the diagnosis of aflutter  The various methods of treatment have been discussed with the patient and family. After consideration of risks, benefits and other options for treatment, the patient has consented to  Procedure(s): A-FLUTTER ABLATION (N/A) as a surgical intervention .  The patient's history has been reviewed, patient examined, no change in status, stable for surgery.  I have reviewed the patient's chart and labs.  Questions were answered to the patient's satisfaction.     Much improved following cardioversion   Still with significant volume overlaod  For RFCA today    [name]

## 2017-08-12 NOTE — Progress Notes (Signed)
Site area: Right groin a 7, and 8 french venous sheath was removed  Site Prior to Removal:  Level 0  Pressure Applied For 20 MINUTES    Bedrest Beginning at 1030am  Manual:   Yes.    Patient Status During Pull:  stable  Post Pull Groin Site:  Level 0  Post Pull Instructions Given:  Yes.    Post Pull Pulses Present:  Yes.    Dressing Applied:  Yes.    Comments:  VS remain stable

## 2017-08-12 NOTE — Discharge Instructions (Signed)
No driving for 4 days. No lifting over 5 lbs for 1 week. No sexual activity for 1 week. You may return to work in 1 week. Keep procedure site clean & dry. If you notice increased pain, swelling, bleeding or pus, call/return!  You may shower, but no soaking baths/hot tubs/pools for 1 week.    Cardiac Ablation, Care After This sheet gives you information about how to care for yourself after your procedure. Your health care provider may also give you more specific instructions. If you have problems or questions, contact your health care provider. What can I expect after the procedure? After the procedure, it is common to have:  Bruising around your puncture site.  Tenderness around your puncture site.  Skipped heartbeats.  Tiredness (fatigue).  Follow these instructions at home: Puncture site care  Follow instructions from your health care provider about how to take care of your puncture site. Make sure you: ? Wash your hands with soap and water before you change your bandage (dressing). If soap and water are not available, use hand sanitizer.  Check your puncture site every day for signs of infection. Check for: ? Redness, swelling, or pain. ? Fluid or blood. If your puncture site starts to bleed, lie down on your back, apply firm pressure to the area, and contact your health care provider. ? Warmth. ? Pus or a bad smell. Driving  Do not drive or use heavy machinery while taking prescription pain medicine.  Do not drive for 24 hours if you were given a medicine to help you relax (sedative) during your procedure. Activity  Avoid activities that take a lot of effort for at least 3 days after your procedure.  Do not lift anything that is heavier than 10 lb (4.5 kg), or the limit that you are told, until your health care provider says that it is safe.  Return to your normal activities as told by your health care provider. Ask your health care provider what activities are safe for  you. General instructions  Take over-the-counter and prescription medicines only as told by your health care provider.  Do not use any products that contain nicotine or tobacco, such as cigarettes and e-cigarettes. If you need help quitting, ask your health care provider.  Do not take baths, swim, or use a hot tub for 24 hours.  Do not drink alcohol for 24 hours after your procedure.  Keep all follow-up visits as told by your health care provider. This is important. Contact a health care provider if:  You have redness, mild swelling, or pain around your puncture site.  You have fluid or blood coming from your puncture site that stops after applying firm pressure to the area.  Your puncture site feels warm to the touch.  You have pus or a bad smell coming from your puncture site.  You have a fever.  You have chest pain or discomfort that spreads to your neck, jaw, or arm.  You are sweating a lot.  You feel nauseous.  You have a fast or irregular heartbeat.  You have shortness of breath.  You are dizzy or light-headed and feel the need to lie down.  You have pain or numbness in the arm or leg closest to your puncture site. Get help right away if:  Your puncture site suddenly swells.  Your puncture site is bleeding and the bleeding does not stop after applying firm pressure to the area. These symptoms may represent a serious problem that is  an emergency. Do not wait to see if the symptoms will go away. Get medical help right away. Call your local emergency services (911 in the [place] Do not drive yourself to the hospital. Summary  After the procedure, it is normal to have bruising and tenderness at the puncture site in your groin, neck, or forearm.  Check your puncture site every day for signs of infection.  Get help right away if your puncture site is bleeding and the bleeding does not stop after applying firm pressure to the area. This is a medical emergency. This  information is not intended to replace advice given to you by your health care provider. Make sure you discuss any questions you have with your health care provider. Document Released: 06/05/2016 Document Revised: 06/05/2016 Document Reviewed: 06/05/2016 Elsevier Interactive Patient Education  2018 [employer].

## 2017-08-12 NOTE — Anesthesia Procedure Notes (Signed)
Procedure Name: LMA Insertion Date/Time: 08/12/2017 8:42 AM Performed by: Leonor Liv, CRNA Pre-anesthesia Checklist: Patient identified, Emergency Drugs available, Suction available and Patient being monitored Patient Re-evaluated:Patient Re-evaluated prior to induction Oxygen Delivery Method: Circle System Utilized Preoxygenation: Pre-oxygenation with 100% oxygen Induction Type: IV induction Ventilation: Mask ventilation without difficulty LMA: LMA inserted LMA Size: 5.0 Number of attempts: 1 Placement Confirmation: positive ETCO2 Tube secured with: Tape Dental Injury: Teeth and Oropharynx as per pre-operative assessment

## 2017-08-12 NOTE — Anesthesia Postprocedure Evaluation (Signed)
Anesthesia Post Note  Patient: Ronald [name]  Procedure(s) Performed: A-FLUTTER ABLATION (N/A )     Patient location during evaluation: PACU Anesthesia Type: General Level of consciousness: awake and alert Pain management: pain level controlled Vital Signs Assessment: post-procedure vital signs reviewed and stable Respiratory status: spontaneous breathing, nonlabored ventilation and respiratory function stable Cardiovascular status: blood pressure returned to baseline and stable Postop Assessment: no apparent nausea or vomiting Anesthetic complications: no    Last Vitals:  Vitals:   08/12/17 1300 08/12/17 1400  BP: 117/65 123/71  Pulse:    Resp: 17 17  Temp:    SpO2: 94% 93%    Last Pain:  Vitals:   08/12/17 1110  TempSrc:   PainSc: 0-No pain                 Audry Pili

## 2017-08-12 NOTE — Progress Notes (Signed)
Patient seen and examined.  Groins without bleeding.  Instructions given.  Will resume apixaban tonight.

## 2017-08-12 NOTE — Progress Notes (Signed)
Left femoral sheaths - 64F and 52F venous sheaths Site was level 0 prior to pull Manual pressure held for 30 min. Bedrest begins at 1030 VS WNL during pull Left groin site post pull - Level 0 Gauze w/ tegaderm applied to site post pull - clean, dry and intact Post instructions concerning activity was provided to patient Bilat. DP post pull 3+ Pt's VS WNL post pull and patient is oriented X 4

## 2017-08-13 ENCOUNTER — Ambulatory Visit: Payer: PPO | Admitting: Internal Medicine

## 2017-08-15 ENCOUNTER — Other Ambulatory Visit: Payer: Self-pay | Admitting: Internal Medicine

## 2017-08-16 ENCOUNTER — Encounter: Payer: Self-pay | Admitting: Internal Medicine

## 2017-08-17 MED FILL — Heparin Sod (Porcine)-NaCl IV Soln 1000 Unit/500ML-0.9%: INTRAVENOUS | Qty: 1000 | Status: AC

## 2017-08-17 MED FILL — Bupivacaine HCl Preservative Free (PF) Inj 0.25%: INTRAMUSCULAR | Qty: 30 | Status: AC

## 2017-08-19 ENCOUNTER — Other Ambulatory Visit: Payer: Self-pay

## 2017-08-19 MED ORDER — METOLAZONE 2.5 MG PO TABS: 2.5000 mg | ORAL_TABLET | Freq: Every day | ORAL | 0 refills | Status: DC

## 2017-08-19 MED ORDER — METOLAZONE 2.5 MG PO TABS: 2.5000 mg | ORAL_TABLET | Freq: Every day | ORAL | 3 refills | Status: DC

## 2017-08-19 NOTE — Addendum Note (Signed)
Addended by: Dollene Primrose on: 08/19/2017 02:10 PM   Modules accepted: Orders

## 2017-08-19 NOTE — Progress Notes (Signed)
Please resend to pharmacy. Thanks

## 2017-09-04 ENCOUNTER — Encounter: Payer: Self-pay | Admitting: Internal Medicine

## 2017-09-14 ENCOUNTER — Ambulatory Visit: Payer: PPO | Admitting: Internal Medicine

## 2017-09-14 ENCOUNTER — Encounter: Payer: Self-pay | Admitting: Internal Medicine

## 2017-09-14 VITALS — BP 124/86 | HR 59 | Ht 67.5 in | Wt 208.0 lb

## 2017-09-14 DIAGNOSIS — I4819 Other persistent atrial fibrillation: Secondary | ICD-10-CM

## 2017-09-14 DIAGNOSIS — I428 Other cardiomyopathies: Secondary | ICD-10-CM | POA: Diagnosis not present

## 2017-09-14 DIAGNOSIS — I481 Persistent atrial fibrillation: Secondary | ICD-10-CM | POA: Diagnosis not present

## 2017-09-14 DIAGNOSIS — I471 Supraventricular tachycardia: Secondary | ICD-10-CM | POA: Diagnosis not present

## 2017-09-14 NOTE — Progress Notes (Signed)
Patient Care Team: Colon Branch, MD as PCP - Evalina Field, MD as Consulting Physician (Ophthalmology) Ceasar Mons, MD as Consulting Physician (Urology)   HPI  Ronald [name] is a 83 y.o. male Seen in follow-up for recurrent atrial arrhythmias.  It was elected because of his CHADS-VASc score and the in's rate of his atrial arrhythmia to begin him on anticoagulation.    Date Cr Hgb  2/18   17.3  6/18 1.21    12/18 1.33 16.1       He has had atrial fibrillation and was treated with flecainide.  More recently he presented with atrial flutter for which he underwent cardioversion with the help control to help maintain sinus rhythm.  He underwent flutter ablation 6/19  He has no palps  His exercise tolerance is better  Chronic edema; dyspnea less and no chest pain     DATE TEST EF   12/17 Echo   60-65 %   1/19  Echo   25-30 %   4/19 Echo   25-30 %       Records and Results Reviewed   Past Medical History:  Diagnosis Date  . BPH (benign prostatic hypertrophy)    f/u @ Biopsy, s/p bx 2009 apro (-)  . Calculus of kidney    s/p R percutaneus nephrostomy @ [facility]  . Cataract   . Colon polyp   . Complication of anesthesia    violent upon waking up  . Diabetes mellitus    pre diabetic  . Diverticulosis   . Edema leg    asymetric , L > R , [place] neg for DVT 09-2010  . GERD (gastroesophageal reflux disease)   . Hemorrhoids   . Hypercalcemia    parathyroidectomy 04/2008  . Hyperlipidemia   . Hypertension   . Iron deficiency anemia     Past Surgical History:  Procedure Laterality Date  . A-FLUTTER ABLATION N/A 08/12/2017   Procedure: A-FLUTTER ABLATION;  Surgeon: Deboraha Sprang, MD;  Location: [facility];  Service: Cardiovascular;  Laterality: N/A;  . APPENDECTOMY    . CATARACT EXTRACTION Right   . COLONOSCOPY  12/16/2011   Procedure: COLONOSCOPY;  Surgeon: Inda Castle, MD;  Location: [facility];  Service: Endoscopy;  Laterality:  N/A;  . ESOPHAGOGASTRODUODENOSCOPY N/A 01/17/2013   Procedure: ESOPHAGOGASTRODUODENOSCOPY (EGD);  Surgeon: Inda Castle, MD;  Location: [facility];  Service: Endoscopy;  Laterality: N/A;  . EXTERNAL EAR SURGERY  01/2016   BCC removal  . EYE SURGERY  infant   "lazy eye"  . FLEXIBLE SIGMOIDOSCOPY  02/09/2012   Procedure: FLEXIBLE SIGMOIDOSCOPY;  Surgeon: Inda Castle, MD;  Location: [facility];  Service: Endoscopy;  Laterality: N/A;  . HEMORRHOID BANDING  02/09/2012   Procedure: HEMORRHOID BANDING;  Surgeon: Inda Castle, MD;  Location: [facility];  Service: Endoscopy;  Laterality: N/A;  . HOT HEMOSTASIS N/A 01/17/2013   Procedure: HOT HEMOSTASIS (ARGON PLASMA COAGULATION/BICAP);  Surgeon: Inda Castle, MD;  Location: [facility];  Service: Endoscopy;  Laterality: N/A;  . KIDNEY STONE SURGERY    . PARATHYROIDECTOMY  04/2008  . TONSILLECTOMY      Current Outpatient Medications  Medication Sig Dispense Refill  . benazepril (LOTENSIN) 20 MG tablet Take 1 tablet (20 mg total) by mouth daily. 90 tablet 3  . cholecalciferol (VITAMIN D) 1000 UNITS tablet Take 1,000 Units by mouth daily.    Marland Kitchen ELIQUIS 5 MG TABS tablet TAKE  1 TABLET BY MOUTH TWICE DAILY 180 tablet 3  . flecainide (TAMBOCOR) 50 MG tablet Take 1 tablet (50 mg total) every morning and take 2 tablets (100 mg total) every evening. (Patient taking differently: Take 50-100 mg by mouth See admin instructions. Take 1 tablet (50 mg total) every morning and take 2 tablets (100 mg total) every evening.) 270 tablet 2  . furosemide (LASIX) 40 MG tablet Take 1 tablet (40 mg total) by mouth daily. 90 tablet 3  . metoprolol succinate (TOPROL-XL) 50 MG 24 hr tablet Take 1 tablet (50 mg total) by mouth daily. 90 tablet 3  . Multiple Vitamins-Minerals (PRESERVISION AREDS 2) CAPS Take 1 capsule by mouth daily.    Marland Kitchen NIFEdipine (PROCARDIA XL/ADALAT-CC) 60 MG 24 hr tablet Take 1 tablet (60 mg total) by mouth daily. 90 tablet 3  . omeprazole  (PRILOSEC) 20 MG capsule TAKE 1 CAPSULE BY MOUTH ONCE DAILY 90 capsule 3  . Saw Palmetto, Serenoa repens, (SAW PALMETTO EXTRACT PO) Take 1 capsule by mouth daily.    . simvastatin (ZOCOR) 40 MG tablet Take 1 tablet (40 mg total) by mouth at bedtime. 90 tablet 3   No current facility-administered medications for this visit.     Allergies  Allergen Reactions  . Cucumber Extract Nausea And Vomiting  . Sulfonamide Derivatives Other (See Comments)    "don't know reaction" per pt      Review of Systems negative except from HPI and PMH  Physical Exam BP 124/86   Pulse (!) 59   Ht 5' 7.5" (1.715 m)   Wt 208 lb (94.3 kg)   SpO2 92%   BMI 32.10 kg/m Well developed and nourished in no acute distress HENT normal Neck supple with JVP-flat Clear Regular rate and rhythm, no murmurs or gallops Abd-soft with active BS No Clubbing cyanosis 2+ chronic edema Skin-warm and dry A & Oriented  Grossly normal sensory and motor function   ECG today sinus 59  Assessment and  Plan  Atrial tach and fibrillation-persistent  Atrial flutter s/p ablation  Hypertension  Chronic edema   Much improved in sinus and holding \ Will continue flecainide for atrial fib suppression  Edema back to base line   BP well controlled

## 2017-09-14 NOTE — Patient Instructions (Signed)

## 2017-09-17 ENCOUNTER — Other Ambulatory Visit: Payer: Self-pay

## 2017-09-17 ENCOUNTER — Encounter (HOSPITAL_BASED_OUTPATIENT_CLINIC_OR_DEPARTMENT_OTHER): Payer: Self-pay | Admitting: Emergency Medicine

## 2017-09-17 ENCOUNTER — Emergency Department (HOSPITAL_BASED_OUTPATIENT_CLINIC_OR_DEPARTMENT_OTHER)
Admission: EM | Admit: 2017-09-17 | Discharge: 2017-09-17 | Disposition: A | Discharge status: Home / Self Care | Payer: PPO | Attending: Emergency Medicine | Admitting: Emergency Medicine

## 2017-09-17 DIAGNOSIS — I10 Essential (primary) hypertension: Secondary | ICD-10-CM | POA: Insufficient documentation

## 2017-09-17 DIAGNOSIS — E119 Type 2 diabetes mellitus without complications: Secondary | ICD-10-CM | POA: Diagnosis not present

## 2017-09-17 DIAGNOSIS — Z79899 Other long term (current) drug therapy: Secondary | ICD-10-CM | POA: Diagnosis not present

## 2017-09-17 DIAGNOSIS — M7989 Other specified soft tissue disorders: Secondary | ICD-10-CM | POA: Diagnosis not present

## 2017-09-17 DIAGNOSIS — E785 Hyperlipidemia, unspecified: Secondary | ICD-10-CM | POA: Diagnosis not present

## 2017-09-17 DIAGNOSIS — R319 Hematuria, unspecified: Secondary | ICD-10-CM | POA: Insufficient documentation

## 2017-09-17 DIAGNOSIS — I4892 Unspecified atrial flutter: Secondary | ICD-10-CM | POA: Insufficient documentation

## 2017-09-17 DIAGNOSIS — Z87442 Personal history of urinary calculi: Secondary | ICD-10-CM | POA: Diagnosis not present

## 2017-09-17 DIAGNOSIS — Z87891 Personal history of nicotine dependence: Secondary | ICD-10-CM | POA: Diagnosis not present

## 2017-09-17 DIAGNOSIS — D689 Coagulation defect, unspecified: Secondary | ICD-10-CM | POA: Diagnosis not present

## 2017-09-17 DIAGNOSIS — Z7901 Long term (current) use of anticoagulants: Secondary | ICD-10-CM | POA: Diagnosis not present

## 2017-09-17 DIAGNOSIS — Z87448 Personal history of other diseases of urinary system: Secondary | ICD-10-CM

## 2017-09-17 LAB — URINALYSIS, ROUTINE W REFLEX MICROSCOPIC

## 2017-09-17 LAB — CBC WITH DIFFERENTIAL/PLATELET
BASOS ABS: 0 10*3/uL (ref 0.0–0.1)
Basophils Relative: 0 %
EOS PCT: 1 %
Eosinophils Absolute: 0.1 10*3/uL (ref 0.0–0.7)
HCT: 42 % (ref 39.0–52.0)
Hemoglobin: 14 g/dL (ref 13.0–17.0)
LYMPHS ABS: 1.6 10*3/uL (ref 0.7–4.0)
Lymphocytes Relative: 18 %
MCH: 27 pg (ref 26.0–34.0)
MCHC: 33.3 g/dL (ref 30.0–36.0)
MCV: 80.9 fL (ref 78.0–100.0)
MONO ABS: 0.7 10*3/uL (ref 0.1–1.0)
Monocytes Relative: 8 %
Neutro Abs: 6.6 10*3/uL (ref 1.7–7.7)
Neutrophils Relative %: 73 %
PLATELETS: 181 10*3/uL (ref 150–400)
RBC: 5.19 MIL/uL (ref 4.22–5.81)
RDW: 14.9 % (ref 11.5–15.5)
WBC: 9 10*3/uL (ref 4.0–10.5)

## 2017-09-17 LAB — BASIC METABOLIC PANEL
Anion gap: 7 (ref 5–15)
BUN: 28 mg/dL — AB (ref 8–23)
CALCIUM: 8.7 mg/dL — AB (ref 8.9–10.3)
CO2: 29 mmol/L (ref 22–32)
CREATININE: 1.33 mg/dL — AB (ref 0.61–1.24)
Chloride: 104 mmol/L (ref 98–111)
GFR calc Af Amer: 56 mL/min — ABNORMAL LOW (ref >60)
GFR calc non Af Amer: 48 mL/min — ABNORMAL LOW (ref >60)
GLUCOSE: 129 mg/dL — AB (ref 70–99)
Potassium: 4 mmol/L (ref 3.5–5.1)
Sodium: 140 mmol/L (ref 135–145)

## 2017-09-17 LAB — URINALYSIS, MICROSCOPIC (REFLEX)
RBC / HPF: >50 RBC/hpf (ref 0–5)
SQUAMOUS EPITHELIAL / LPF: NONE SEEN (ref 0–5)

## 2017-09-17 MED ORDER — SODIUM CHLORIDE 0.9 % IV BOLUS
500.0000 mL | Freq: Once | INTRAVENOUS | Status: AC
  Administered 2017-09-17: 500 mL via INTRAVENOUS

## 2017-09-17 MED ORDER — SODIUM CHLORIDE 0.9 % IV SOLN
INTRAVENOUS | Status: DC
  Administered 2017-09-17: 20:00:00 via INTRAVENOUS

## 2017-09-17 NOTE — Discharge Instructions (Addendum)
Discussed with Dr. Karsten Ro on-call for urology.  Recommended as long as you are able to urinate even though there is blood they do not want to place a Foley catheter.  Call tomorrow to set up an appointment for next week.  He is going to get a note to Dr. Gilford Rile.  Return for any chest pain shortness of breath feel like he will pass out or any lightheadedness.  Or inability to urinate.

## 2017-09-17 NOTE — ED Notes (Signed)
[place] and felt he passed a "bladder stone". Pt denies having any flank pain prior.  Pt began bleeding from penis and when he passed a large clot he traveled back home to come to ED.

## 2017-09-17 NOTE — ED Provider Notes (Signed)
[facility] EMERGENCY DEPARTMENT Provider Note   CSN: [id number] Arrival date & time: 09/17/17  1554     History   Chief Complaint Chief Complaint  Patient presents with  . Coagulation Disorder    HPI Ronald [name] is a 83 y.o. male.  Patient then started to bleed.  He is on Eliquis for atrial flutter.  Patient is continued to bleed.  But he is able to urinate.  Occasionally passes clots.  Always does urinate urine with blood mixed in with it.  No abdominal pain no flank pain no back pain no feeling as if he is going to pass out.     Past Medical History:  Diagnosis Date  . BPH (benign prostatic hypertrophy)    f/u @ Biopsy, s/p bx 2009 apro (-)  . Calculus of kidney    s/p R percutaneus nephrostomy @ [facility]  . Cataract   . Colon polyp   . Complication of anesthesia    violent upon waking up  . Diabetes mellitus    pre diabetic  . Diverticulosis   . Edema leg    asymetric , L > R , [place] neg for DVT 09-2010  . GERD (gastroesophageal reflux disease)   . Hemorrhoids   . Hypercalcemia    parathyroidectomy 04/2008  . Hyperlipidemia   . Hypertension   . Iron deficiency anemia     Patient Active Problem List   Diagnosis Date Noted  . Atrial flutter ([place]) 08/09/2016  . 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 Personal history of colonic polyps 12/01/2011  . Elevated PSA 04/12/2008  . ERECTILE DYSFUNCTION 05/25/2007  . DMII (diabetes mellitus, type 2) ([place]) 03/02/2007  . CALCULUS OF KIDNEY 02/15/2007  . HYPERPARATHYROIDISM UNSPECIFIED 11/16/2006  . HYPERLIPIDEMIA 11/11/2006  . Essential hypertension 11/11/2006  . GERD 11/11/2006    Past Surgical History:  Procedure Laterality Date  . A-FLUTTER ABLATION N/A 08/12/2017   Procedure: A-FLUTTER ABLATION;  Surgeon: Deboraha Sprang, MD;  Location: [facility];  Service: Cardiovascular;  Laterality: N/A;  . APPENDECTOMY    . CATARACT EXTRACTION Right   . COLONOSCOPY  12/16/2011   Procedure:  COLONOSCOPY;  Surgeon: Inda Castle, MD;  Location: [facility];  Service: Endoscopy;  Laterality: N/A;  . ESOPHAGOGASTRODUODENOSCOPY N/A 01/17/2013   Procedure: ESOPHAGOGASTRODUODENOSCOPY (EGD);  Surgeon: Inda Castle, MD;  Location: [facility];  Service: Endoscopy;  Laterality: N/A;  . EXTERNAL EAR SURGERY  01/2016   BCC removal  . EYE SURGERY  infant   "lazy eye"  . FLEXIBLE SIGMOIDOSCOPY  02/09/2012   Procedure: FLEXIBLE SIGMOIDOSCOPY;  Surgeon: Inda Castle, MD;  Location: [facility];  Service: Endoscopy;  Laterality: N/A;  . HEMORRHOID BANDING  02/09/2012   Procedure: HEMORRHOID BANDING;  Surgeon: Inda Castle, MD;  Location: [facility];  Service: Endoscopy;  Laterality: N/A;  . HOT HEMOSTASIS N/A 01/17/2013   Procedure: HOT HEMOSTASIS (ARGON PLASMA COAGULATION/BICAP);  Surgeon: Inda Castle, MD;  Location: [facility];  Service: Endoscopy;  Laterality: N/A;  . KIDNEY STONE SURGERY    . PARATHYROIDECTOMY  04/2008  . TONSILLECTOMY          Home Medications    Prior to Admission medications   Medication Sig Start Date End Date Taking? Authorizing Provider  benazepril (LOTENSIN) 20 MG tablet Take 1 tablet (20 mg total) by mouth daily. 02/18/17   Colon Branch, MD  cholecalciferol (VITAMIN D) 1000 UNITS tablet Take 1,000 Units by mouth daily.  [provider]  ELIQUIS 5 MG TABS tablet TAKE 1 TABLET BY MOUTH TWICE DAILY 06/09/17   Deboraha Sprang, MD  flecainide (TAMBOCOR) 50 MG tablet Take 1 tablet (50 mg total) every morning and take 2 tablets (100 mg total) every evening. Patient taking differently: Take 50-100 mg by mouth See admin instructions. Take 1 tablet (50 mg total) every morning and take 2 tablets (100 mg total) every evening. 05/26/17   Patsey Berthold, NP  furosemide (LASIX) 40 MG tablet Take 1 tablet (40 mg total) by mouth daily. 07/06/17 10/04/17  Deboraha Sprang, MD  metoprolol succinate (TOPROL-XL) 50 MG 24 hr tablet Take 1 tablet (50 mg  total) by mouth daily. 03/23/17   Colon Branch, MD  Multiple Vitamins-Minerals (PRESERVISION AREDS 2) CAPS Take 1 capsule by mouth daily.    [provider]  NIFEdipine (PROCARDIA XL/ADALAT-CC) 60 MG 24 hr tablet Take 1 tablet (60 mg total) by mouth daily. 06/08/17   Colon Branch, MD  omeprazole (PRILOSEC) 20 MG capsule TAKE 1 CAPSULE BY MOUTH ONCE DAILY 08/17/17   Colon Branch, MD  Saw Palmetto, Serenoa repens, (SAW PALMETTO EXTRACT PO) Take 1 capsule by mouth daily.    [provider]  simvastatin (ZOCOR) 40 MG tablet Take 1 tablet (40 mg total) by mouth at bedtime. 02/23/17   Colon Branch, MD    Family History Family History  Problem Relation Age of Onset  . Heart attack Father 59  . Heart attack Mother   . Colon cancer Neg Hx   . Prostate cancer Neg Hx   . Diabetes Neg Hx     Social History Social History   Tobacco Use  . Smoking status: Former Smoker    Years: 25.00    Types: Cigarettes    Last attempt to quit: 11/30/1981    Years since quitting: 35.8  . Smokeless tobacco: Never Used  Substance Use Topics  . Alcohol use: Yes    Alcohol/week: 4.2 oz    Types: 7 Glasses of wine per week    Comment: 1 glass of wine or beer with dinner  . Drug use: No     Allergies   Cucumber extract and Sulfonamide derivatives   Review of Systems Review of Systems  Constitutional: Negative for fever.  HENT: Negative for congestion.   Eyes: Negative for visual disturbance.  Respiratory: Negative for shortness of breath.   Cardiovascular: Positive for leg swelling. Negative for chest pain.  Gastrointestinal: Negative for abdominal pain.  Genitourinary: Positive for hematuria.  Musculoskeletal: Negative for back pain.  Skin: Negative for rash.  Neurological: Negative for syncope and headaches.  Hematological: Bruises/bleeds easily.  Psychiatric/Behavioral: Negative for confusion.     Physical Exam Updated Vital Signs BP (!) 142/80   Pulse 72   Temp 98.4 F (36.9  C) (Oral)   Resp 17   Ht 1.715 m (5' 7.5")   Wt 93 kg (205 lb)   SpO2 94%   BMI 31.63 kg/m   Physical Exam  Constitutional: He is oriented to person, place, and time. He appears well-developed and well-nourished. No distress.  HENT:  Head: Normocephalic and atraumatic.  Mouth/Throat: Oropharynx is clear and moist.  Eyes: Pupils are equal, round, and reactive to light. Conjunctivae and EOM are normal.  Neck: Normal range of motion. Neck supple.  Cardiovascular: Normal rate, regular rhythm and normal heart sounds.  Pulmonary/Chest: Effort normal and breath sounds normal.  Abdominal: Soft. Bowel sounds are normal.  He exhibits no mass. There is no tenderness.  Genitourinary:  Genitourinary Comments: Patient with blood at the tip of the meatus.  No active bleeding.  Musculoskeletal: Normal range of motion. He exhibits edema.  Bilateral leg swelling  Neurological: He is alert and oriented to person, place, and time.  Skin: Skin is warm.  Nursing note and vitals reviewed.    ED Treatments / Results  Labs (all labs ordered are listed, but only abnormal results are displayed) Labs Reviewed  BASIC METABOLIC PANEL - Abnormal; Notable for the following components:      Result Value   Glucose, Bld 129 (*)    BUN 28 (*)    Creatinine, Ser 1.33 (*)    Calcium 8.7 (*)    GFR calc non Af Amer 48 (*)    GFR calc Af Amer 56 (*)    All other components within normal limits  URINALYSIS, ROUTINE W REFLEX MICROSCOPIC - Abnormal; Notable for the following components:   APPearance HAZY (*)    Glucose, UA   (*)    Value: TEST NOT REPORTED DUE TO COLOR INTERFERENCE OF URINE PIGMENT   Hgb urine dipstick   (*)    Value: TEST NOT REPORTED DUE TO COLOR INTERFERENCE OF URINE PIGMENT   Bilirubin Urine   (*)    Value: TEST NOT REPORTED DUE TO COLOR INTERFERENCE OF URINE PIGMENT   Ketones, ur   (*)    Value: TEST NOT REPORTED DUE TO COLOR INTERFERENCE OF URINE PIGMENT   Protein, ur   (*)    Value:  TEST NOT REPORTED DUE TO COLOR INTERFERENCE OF URINE PIGMENT   Nitrite   (*)    Value: TEST NOT REPORTED DUE TO COLOR INTERFERENCE OF URINE PIGMENT   Leukocytes, UA   (*)    Value: TEST NOT REPORTED DUE TO COLOR INTERFERENCE OF URINE PIGMENT   All other components within normal limits  URINALYSIS, MICROSCOPIC (REFLEX) - Abnormal; Notable for the following components:   Bacteria, UA FEW (*)    All other components within normal limits  URINE CULTURE  CBC WITH DIFFERENTIAL/PLATELET    EKG EKG Interpretation  Date/Time:  Thursday September 17 2017 19:48:35 EDT Ventricular Rate:  72 PR Interval:    QRS Duration: 112 QT Interval:  433 QTC Calculation: 474 R Axis:   -44 Text Interpretation:  Sinus rhythm Borderline IVCD with LAD No significant change since last tracing Confirmed by Fredia Sorrow [phone]) on 09/17/2017 8:32:03 PM   Radiology No results found.  Procedures Procedures (including critical care time)  Medications Ordered in ED Medications  0.9 %  sodium chloride infusion ( Intravenous New Bag/Given 09/17/17 1939)  sodium chloride 0.9 % bolus 500 mL (0 mLs Intravenous Stopped 09/17/17 2009)     Initial Impression / Assessment and Plan / ED Course  I have reviewed the triage vital signs and the nursing notes.  Pertinent labs & imaging results that were available during my care of the patient were reviewed by me and considered in my medical decision making (see chart for details).     Patient with history of bladder stones documented by urology.  Patient felt as if he passed something today and then has had hematuria.  It occurred at 12 noon.  Was not preceded by any CVA pain or flank pain.  Patient is on Eliquis for atrial flutter.  Patient has had continued hematuria since the event.  Patient's hemoglobin without any significant abnormalities renal function normal.  Urinalysis  sent to much blood to rule out a infection so sent for urine culture.  Discussed with Dr.  Karsten Ro.  Stay as long as patient is urinating on his own though hold off a Foley catheter we will set up an appointment or have the patient call and confirm for an appointment with urology next week.  Patient will return for any lightheadedness feel like is in a pass out any chest pain or shortness of breath.  Or if he is unable to urinate.  Patient will hydrate well.  Patient able to urinate here.  Not gross blood.  Mixed with urine.  But a fair amount of hematuria.  Patient at 12 noon had some discomfort while trying to urinate thinks he passed a stone.  Was told about 8 9 months ago that he had a bladder stones.  Final Clinical Impressions(s) / ED Diagnoses   Final diagnoses:  History of bladder stone  Hematuria, unspecified type    ED Discharge Orders    None       Fredia Sorrow, MD 09/17/17 2050

## 2017-09-17 NOTE — ED Notes (Signed)
Pt on monitor 

## 2017-09-17 NOTE — ED Triage Notes (Signed)
Pt c/o bleeding from his penis. He states he passed a kidney stone and takes eliquis and cannot get the bleeding to stop. Blood is noted to front of his pants.

## 2017-09-19 LAB — URINE CULTURE: CULTURE: NO GROWTH

## 2017-09-24 DIAGNOSIS — R310 Gross hematuria: Secondary | ICD-10-CM | POA: Diagnosis not present

## 2017-09-24 DIAGNOSIS — N200 Calculus of kidney: Secondary | ICD-10-CM | POA: Diagnosis not present

## 2017-09-24 DIAGNOSIS — N210 Calculus in bladder: Secondary | ICD-10-CM | POA: Diagnosis not present

## 2017-09-24 DIAGNOSIS — R972 Elevated prostate specific antigen [PSA]: Secondary | ICD-10-CM | POA: Diagnosis not present

## 2017-11-14 ENCOUNTER — Ambulatory Visit (HOSPITAL_BASED_OUTPATIENT_CLINIC_OR_DEPARTMENT_OTHER): Payer: PPO

## 2017-11-16 ENCOUNTER — Telehealth: Payer: Self-pay

## 2017-11-16 NOTE — Telephone Encounter (Signed)
Had a CT done at urology on April, they found incidentally a lung nodule.  This is a follow-up on that nodule.

## 2017-11-16 NOTE — Telephone Encounter (Signed)
Pt was scheduled for CT chest wo on 11/14/2017- however he did not have this completed because he was confused as to why he was needing it.

## 2017-11-16 NOTE — Telephone Encounter (Signed)
My Chart message sent

## 2017-11-18 ENCOUNTER — Ambulatory Visit (INDEPENDENT_AMBULATORY_CARE_PROVIDER_SITE_OTHER): Payer: PPO | Admitting: Internal Medicine

## 2017-11-18 ENCOUNTER — Encounter: Payer: Self-pay | Admitting: Internal Medicine

## 2017-11-18 VITALS — BP 128/86 | HR 55 | Temp 97.8°F | Resp 16 | Ht 68.0 in | Wt 212.0 lb

## 2017-11-18 DIAGNOSIS — I10 Essential (primary) hypertension: Secondary | ICD-10-CM | POA: Diagnosis not present

## 2017-11-18 DIAGNOSIS — E118 Type 2 diabetes mellitus with unspecified complications: Secondary | ICD-10-CM | POA: Diagnosis not present

## 2017-11-18 DIAGNOSIS — R911 Solitary pulmonary nodule: Secondary | ICD-10-CM

## 2017-11-18 NOTE — Progress Notes (Signed)
Subjective:    Patient ID: Ronald [name], male    DOB: [birth year], 83 y.o.   MRN: [id number]  DOS:  11/18/2017 Type of visit - description : rov Interval history: -Feels well, no major concerns -Likes to discuss CT results from April -Went to the ER with hematuria, subsequently saw urology, they recommended no further eval.  Currently with no further symptoms  Review of Systems Denies chest pain, difficulty breathing No nausea, vomiting.  No abdominal pain no blood in the stools  Past Medical History:  Diagnosis Date  . BPH (benign prostatic hypertrophy)    f/u @ Biopsy, s/p bx 2009 apro (-)  . Calculus of kidney    s/p R percutaneus nephrostomy @ [facility]  . Cataract   . Colon polyp   . Complication of anesthesia    violent upon waking up  . Diabetes mellitus    pre diabetic  . Diverticulosis   . Edema leg    asymetric , L > R , [place] neg for DVT 09-2010  . GERD (gastroesophageal reflux disease)   . Hemorrhoids   . Hypercalcemia    parathyroidectomy 04/2008  . Hyperlipidemia   . Hypertension   . Iron deficiency anemia     Past Surgical History:  Procedure Laterality Date  . A-FLUTTER ABLATION N/A 08/12/2017   Procedure: A-FLUTTER ABLATION;  Surgeon: Deboraha Sprang, MD;  Location: [place] [facility];  Service: Cardiovascular;  Laterality: N/A;  . APPENDECTOMY    . CATARACT EXTRACTION Right   . COLONOSCOPY  12/16/2011   Procedure: COLONOSCOPY;  Surgeon: Inda Castle, MD;  Location: [facility];  Service: Endoscopy;  Laterality: N/A;  . ESOPHAGOGASTRODUODENOSCOPY N/A 01/17/2013   Procedure: ESOPHAGOGASTRODUODENOSCOPY (EGD);  Surgeon: Inda Castle, MD;  Location: [facility];  Service: Endoscopy;  Laterality: N/A;  . EXTERNAL EAR SURGERY  01/2016   BCC removal  . EYE SURGERY  infant   "lazy eye"  . FLEXIBLE SIGMOIDOSCOPY  02/09/2012   Procedure: FLEXIBLE SIGMOIDOSCOPY;  Surgeon: Inda Castle, MD;  Location: [facility];  Service: Endoscopy;  Laterality: N/A;   . HEMORRHOID BANDING  02/09/2012   Procedure: HEMORRHOID BANDING;  Surgeon: Inda Castle, MD;  Location: [facility];  Service: Endoscopy;  Laterality: N/A;  . HOT HEMOSTASIS N/A 01/17/2013   Procedure: HOT HEMOSTASIS (ARGON PLASMA COAGULATION/BICAP);  Surgeon: Inda Castle, MD;  Location: [facility];  Service: Endoscopy;  Laterality: N/A;  . KIDNEY STONE SURGERY    . PARATHYROIDECTOMY  04/2008  . TONSILLECTOMY      Social History   Socioeconomic History  . Marital status: Widowed    Spouse name: Not on file  . Number of children: 3  . Years of education: Not on file  . Highest education level: Not on file  Occupational History  . Occupation: retired   Scientific laboratory technician  . Financial resource strain: Not on file  . Food insecurity:    Worry: Not on file    Inability: Not on file  . Transportation needs:    Medical: Not on file    Non-medical: Not on file  Tobacco Use  . Smoking status: Former Smoker    Years: 25.00    Types: Cigarettes    Last attempt to quit: 11/30/1981    Years since quitting: 35.9  . Smokeless tobacco: Never Used  Substance and Sexual Activity  . Alcohol use: Yes    Alcohol/week: 7.0 standard drinks    Types: 7 Glasses of wine  per week    Comment: 1 glass of wine or beer with dinner  . Drug use: No  . Sexual activity: Not on file  Lifestyle  . Physical activity:    Days per week: Not on file    Minutes per session: Not on file  . Stress: Not on file  Relationships  . Social connections:    Talks on phone: Not on file    Gets together: Not on file    Attends religious service: Not on file    Active member of club or organization: Not on file    Attends meetings of clubs or organizations: Not on file    Relationship status: Not on file  . Intimate partner violence:    Fear of current or ex partner: Not on file    Emotionally abused: Not on file    Physically abused: Not on file    Forced sexual activity: Not on file  Other Topics Concern    . Not on file  Social History Narrative   Retired, single, has  a girlfriend , 3 children  (one lives in town)   Daughter has a copy of his living will      Allergies as of 11/18/2017      Reactions   Cucumber Extract Nausea And Vomiting   Sulfonamide Derivatives Other (See Comments)   "don't know reaction" per pt      Medication List        Accurate as of 11/18/17 11:59 PM. Always use your most recent med list.          benazepril 20 MG tablet Commonly known as:  LOTENSIN Take 1 tablet (20 mg total) by mouth daily.   cholecalciferol 1000 units tablet Commonly known as:  VITAMIN D Take 1,000 Units by mouth daily.   ELIQUIS 5 MG Tabs tablet Generic drug:  apixaban TAKE 1 TABLET BY MOUTH TWICE DAILY   flecainide 50 MG tablet Commonly known as:  TAMBOCOR Take 1 tablet (50 mg total) every morning and take 2 tablets (100 mg total) every evening.   furosemide 40 MG tablet Commonly known as:  LASIX Take 1 tablet (40 mg total) by mouth daily.   metoprolol succinate 50 MG 24 hr tablet Commonly known as:  TOPROL-XL Take 1 tablet (50 mg total) by mouth daily.   NIFEdipine 60 MG 24 hr tablet Commonly known as:  PROCARDIA XL/ADALAT-CC Take 1 tablet (60 mg total) by mouth daily.   omeprazole 20 MG capsule Commonly known as:  PRILOSEC TAKE 1 CAPSULE BY MOUTH ONCE DAILY   PRESERVISION AREDS 2 Caps Take 1 capsule by mouth daily.   SAW PALMETTO EXTRACT PO Take 1 capsule by mouth daily.   simvastatin 40 MG tablet Commonly known as:  ZOCOR Take 1 tablet (40 mg total) by mouth at bedtime.          Objective:   Physical Exam BP 128/86 (BP Location: Left Arm, Patient Position: Sitting, Cuff Size: Small)   Pulse (!) 55   Temp 97.8 F (36.6 C) (Oral)   Resp 16   Ht 5\' 8"  (1.727 m)   Wt 212 lb (96.2 kg)   SpO2 96%   BMI 32.23 kg/m  General:   Well developed, NAD, see BMI.  HEENT:  Normocephalic . Face symmetric, atraumatic Lungs:  CTA B Normal respiratory  effort, no intercostal retractions, no accessory muscle use. Heart: Seems regular today,  no murmur.  Bilateral edema, worse on the left, at baseline  Skin: Not pale. Not jaundice Neurologic:  alert & oriented X3.  Speech normal, gait appropriate for age and unassisted Psych--  Cognition and judgment appear intact.  Cooperative with normal attention span and concentration.  Behavior appropriate. No anxious or depressed appearing.      Assessment & Plan:   Assessment   DM HTN Hyperlipidemia Osteopenia T score -2.0 (2010), T score -1.6 (12-2014) LEG Edema,L>R , chronic, [place] neg DVT 2012 CV: Atrial tachycardia/flutter DX 02/2016 GU: --BPH  --ED --Elevated PSA, s/p multiple bx (last 2009?neg), last OV w/ urology 2014, last  PSA 2014 ~ 13, declines further eval as off 02-2016 Hypercalcemia, parathyroidectomy 2010 Renal stones, nephrostomy 2009 at [place]:  --Anemia: EGD 12-2012  (Bx: H pylory neg) [id number] gastric polyposis, Bx H Pylory +, was rec treatment Anemia felt to be d/t chronic bleed from  gastric polyp H Pylori + 11-2014, treated, f/u breath test (-)  --GERD --cscope 12-2011 >>> multiple hyperplastic  Polyps, tics  --12-2011 Banding internal hemorrhoids 4 --02-2012: Flex sig d/t discomfort  BCC , removed from L ear CT abd 06/2017 @ urology: RML 1.7 cm nodule, gallbladder stone, hepatic steatosis.  PLAN DM: Diet controlled, last A1c satisfactory. HTN: No ambulatory BPs, BP today is very good, continue Lotensin, Toprol, Procardia, Lasix.  Last BMP satisfactory. Lung nodule, 1.7 cm, RML per CT @ urology 06/2017: Discuss this incidental finding, need for recheck  d/t potential to be a malignant issue.  Patient verbalized understanding but elected to hold off for now mostly due to cost. Gallbladder stones, hepatic steatosis: Those were all other incidental findings per CT, patient aware of results. RTC, CPX 4 months

## 2017-11-18 NOTE — Patient Instructions (Signed)
  GO TO THE FRONT DESK Schedule your next appointment for a  Physical exam in 4 months 

## 2017-11-19 DIAGNOSIS — R911 Solitary pulmonary nodule: Secondary | ICD-10-CM | POA: Insufficient documentation

## 2017-11-19 NOTE — Assessment & Plan Note (Signed)
DM: Diet controlled, last A1c satisfactory. HTN: No ambulatory BPs, BP today is very good, continue Lotensin, Toprol, Procardia, Lasix.  Last BMP satisfactory. Lung nodule, 1.7 cm, RML per CT @ urology 06/2017: Discuss this incidental finding, need for recheck  d/t potential to be a malignant issue.  Patient verbalized understanding but elected to hold off for now mostly due to cost. Gallbladder stones, hepatic steatosis: Those were all other incidental findings per CT, patient aware of results. RTC, CPX 4 months

## 2017-11-23 ENCOUNTER — Encounter: Payer: Self-pay | Admitting: Internal Medicine

## 2017-12-02 ENCOUNTER — Encounter: Payer: Self-pay | Admitting: Internal Medicine

## 2017-12-16 ENCOUNTER — Encounter: Payer: Self-pay | Admitting: Internal Medicine

## 2017-12-23 ENCOUNTER — Ambulatory Visit (HOSPITAL_BASED_OUTPATIENT_CLINIC_OR_DEPARTMENT_OTHER)
Admission: RE | Admit: 2017-12-23 | Discharge: 2017-12-23 | Disposition: A | Discharge status: Home / Self Care | Payer: PPO | Source: Ambulatory Visit | Attending: Internal Medicine | Admitting: Internal Medicine

## 2017-12-23 DIAGNOSIS — D3502 Benign neoplasm of left adrenal gland: Secondary | ICD-10-CM | POA: Diagnosis not present

## 2017-12-23 DIAGNOSIS — R911 Solitary pulmonary nodule: Secondary | ICD-10-CM

## 2017-12-23 DIAGNOSIS — K8020 Calculus of gallbladder without cholecystitis without obstruction: Secondary | ICD-10-CM | POA: Insufficient documentation

## 2017-12-23 DIAGNOSIS — I2510 Atherosclerotic heart disease of native coronary artery without angina pectoris: Secondary | ICD-10-CM | POA: Diagnosis not present

## 2017-12-25 ENCOUNTER — Encounter: Payer: Self-pay | Admitting: Internal Medicine

## 2018-02-08 ENCOUNTER — Other Ambulatory Visit: Payer: Self-pay | Admitting: Internal Medicine

## 2018-02-23 ENCOUNTER — Other Ambulatory Visit: Payer: Self-pay | Admitting: Internal Medicine

## 2018-03-18 DIAGNOSIS — R972 Elevated prostate specific antigen [PSA]: Secondary | ICD-10-CM | POA: Diagnosis not present

## 2018-03-18 LAB — PSA: PSA: 10.3

## 2018-03-19 ENCOUNTER — Encounter: Payer: Self-pay | Admitting: Internal Medicine

## 2018-03-19 ENCOUNTER — Ambulatory Visit: Payer: PPO | Admitting: Internal Medicine

## 2018-03-19 VITALS — BP 128/80 | HR 61 | Ht 68.0 in | Wt 216.0 lb

## 2018-03-19 DIAGNOSIS — I4892 Unspecified atrial flutter: Secondary | ICD-10-CM

## 2018-03-19 DIAGNOSIS — I10 Essential (primary) hypertension: Secondary | ICD-10-CM

## 2018-03-19 DIAGNOSIS — I471 Supraventricular tachycardia: Secondary | ICD-10-CM | POA: Diagnosis not present

## 2018-03-19 DIAGNOSIS — I4819 Other persistent atrial fibrillation: Secondary | ICD-10-CM

## 2018-03-19 NOTE — Patient Instructions (Addendum)
Medication Instructions:  Your physician has recommended you make the following change in your medication:   Increase your lasix to 80mg  daily. Increase to 120mg  on Sunday if not urinating. Call me on Monday if you have not seen an improvement with your urination volume.   Labwork: You will have labs drawn today: CBC and BMP  Testing/Procedures: Your physician has requested that you have an echocardiogram. Echocardiography is a painless test that uses sound waves to create images of your heart. It provides your doctor with information about the size and shape of your heart and how well your heart's chambers and valves are working. This procedure takes approximately one hour. There are no restrictions for this procedure.   Follow-Up: Your physician recommends that you schedule a follow-up appointment in:   6 months with Dr Caryl Comes  Any Other Special Instructions Will Be Listed Below (If Applicable).     If you need a refill on your cardiac medications before your next appointment, please call your pharmacy.

## 2018-03-19 NOTE — Progress Notes (Signed)
Patient Care Team: Colon Branch, MD as PCP - General Deboraha Sprang, MD as PCP - Electrophysiology (Cardiology) Clent Jacks, MD as Consulting Physician (Ophthalmology) Ceasar Mons, MD as Consulting Physician (Urology)   HPI  Ronald [name] is a 84 y.o. male Seen in follow-up for recurrent atrial arrhythmias.  It was elected because of his CHADS-VASc score and the in's rate of his atrial arrhythmia to begin him on anticoagulation.    Date Cr Hgb  2/18   17.3  6/18 1.21    12/18 1.33 16.1       He has had atrial fibrillation and was treated with flecainide.  More recently he presented with atrial flutter for which he underwent cardioversion with the help control to help maintain sinus rhythm.  He underwent flutter ablation 6/19  He has no palps  His exercise tolerance is better  Chronic edema; dyspnea less and no chest pain     DATE TEST EF   12/17 Echo   60-65 %   1/19  Echo   25-30 %   4/19 Echo   25-30 %         DATE PR interval QRSduration Dose  6/18  146 88 0  1/20 176 100 50   He continues to struggle with shortness of breath.  Is worse over the last couple of months.  He has an interval 8 pound weight gain since July.  There is increasing abdominal distention.  Has bendopnea.  Shortness of breath with showering.  No lightheadedness.  Denies chest pain.   Records and Results Reviewed   Past Medical History:  Diagnosis Date  . BPH (benign prostatic hypertrophy)    f/u @ Biopsy, s/p bx 2009 apro (-)  . Calculus of kidney    s/p R percutaneus nephrostomy @ [facility]  . Cataract   . Colon polyp   . Complication of anesthesia    violent upon waking up  . Diabetes mellitus    pre diabetic  . Diverticulosis   . Edema leg    asymetric , L > R , [place] neg for DVT 09-2010  . GERD (gastroesophageal reflux disease)   . Hemorrhoids   . Hypercalcemia    parathyroidectomy 04/2008  . Hyperlipidemia   . Hypertension   . Iron deficiency anemia      Past Surgical History:  Procedure Laterality Date  . A-FLUTTER ABLATION N/A 08/12/2017   Procedure: A-FLUTTER ABLATION;  Surgeon: Deboraha Sprang, MD;  Location: [place] [facility];  Service: Cardiovascular;  Laterality: N/A;  . APPENDECTOMY    . CATARACT EXTRACTION Right   . COLONOSCOPY  12/16/2011   Procedure: COLONOSCOPY;  Surgeon: Inda Castle, MD;  Location: [facility];  Service: Endoscopy;  Laterality: N/A;  . ESOPHAGOGASTRODUODENOSCOPY N/A 01/17/2013   Procedure: ESOPHAGOGASTRODUODENOSCOPY (EGD);  Surgeon: Inda Castle, MD;  Location: [facility];  Service: Endoscopy;  Laterality: N/A;  . EXTERNAL EAR SURGERY  01/2016   BCC removal  . EYE SURGERY  infant   "lazy eye"  . FLEXIBLE SIGMOIDOSCOPY  02/09/2012   Procedure: FLEXIBLE SIGMOIDOSCOPY;  Surgeon: Inda Castle, MD;  Location: [facility];  Service: Endoscopy;  Laterality: N/A;  . HEMORRHOID BANDING  02/09/2012   Procedure: HEMORRHOID BANDING;  Surgeon: Inda Castle, MD;  Location: [facility];  Service: Endoscopy;  Laterality: N/A;  . HOT HEMOSTASIS N/A 01/17/2013   Procedure: HOT HEMOSTASIS (ARGON PLASMA COAGULATION/BICAP);  Surgeon: Inda Castle, MD;  Location: [facility];  Service: Endoscopy;  Laterality: N/A;  . KIDNEY STONE SURGERY    . PARATHYROIDECTOMY  04/2008  . TONSILLECTOMY      Current Outpatient Medications  Medication Sig Dispense Refill  . benazepril (LOTENSIN) 20 MG tablet Take 1 tablet (20 mg total) by mouth daily. 90 tablet 3  . cholecalciferol (VITAMIN D) 1000 UNITS tablet Take 1,000 Units by mouth daily.    Marland Kitchen ELIQUIS 5 MG TABS tablet TAKE 1 TABLET BY MOUTH TWICE DAILY 180 tablet 3  . flecainide (TAMBOCOR) 50 MG tablet Take 1 tablet (50 mg total) every morning and take 2 tablets (100 mg total) every evening. 270 tablet 2  . metoprolol succinate (TOPROL-XL) 50 MG 24 hr tablet Take 1 tablet (50 mg total) by mouth daily. 90 tablet 3  . Multiple Vitamins-Minerals (PRESERVISION AREDS 2)  CAPS Take 1 capsule by mouth daily.    Marland Kitchen NIFEdipine (PROCARDIA XL/ADALAT-CC) 60 MG 24 hr tablet Take 1 tablet (60 mg total) by mouth daily. 90 tablet 3  . omeprazole (PRILOSEC) 20 MG capsule TAKE 1 CAPSULE BY MOUTH ONCE DAILY 90 capsule 3  . Saw Palmetto 500 MG CAPS Take 1 capsule by mouth daily.    . simvastatin (ZOCOR) 40 MG tablet Take 1 tablet (40 mg total) by mouth at bedtime. 90 tablet 3  . furosemide (LASIX) 40 MG tablet Take 1 tablet (40 mg total) by mouth daily. 90 tablet 3   No current facility-administered medications for this visit.     Allergies  Allergen Reactions  . Cucumber Extract Nausea And Vomiting  . Sulfonamide Derivatives Other (See Comments)    "don't know reaction" per pt      Review of Systems negative except from HPI and PMH  Physical Exam BP 128/80   Pulse 61   Ht 5\' 8"  (1.727 m)   Wt 216 lb (98 kg)   SpO2 98%   BMI 32.84 kg/m Well Well developed and nourished in no acute distress HENT normal Neck supple with JVP-8-10 Clear Regular rate and rhythm, no murmurs or gallops Abd-soft with active BS No Clubbing cyanosis 2-3+ edema Skin-warm and dry A & Oriented  Grossly normal sensory and motor function   ECG Sinus 61 18/10/46   Assessment and  Plan  Atrial tach and fibrillation-persistent  Atrial flutter s/p ablation  Hypertension  Chronic edema  Continue flecainide.  QRS duration within range.  Worsening heart failure symptoms.  Evidence of volume overload.  We will increase his furosemide 40--80 and under 120 if necessary to induce diuresis.  Repeat echocardiogram; in the event that LV remains depressed, would change to Entresto and add aldosterone antagonist.  We have discussed the physiology of heart failure including the importance of salt restriction and fluid restriction and have reviewed sources of dietary salt and water.  More than 50% of 40 min was spent in counseling related to the above

## 2018-03-21 ENCOUNTER — Other Ambulatory Visit: Payer: Self-pay | Admitting: Internal Medicine

## 2018-03-22 ENCOUNTER — Telehealth: Payer: Self-pay | Admitting: Internal Medicine

## 2018-03-22 MED ORDER — METOLAZONE 5 MG PO TABS: ORAL_TABLET | ORAL | 0 refills | Status: DC

## 2018-03-22 NOTE — Telephone Encounter (Signed)
  Patient states that he followed the instructions regarding taking his lasik and he says it did not work.

## 2018-03-22 NOTE — Telephone Encounter (Signed)
Spoke with pt. Per Dr Caryl Comes, pt is to take 5mg  Metolazone x 1 day for fluid management. Pt understands to continue his lasix and to call if he notices poor urine output while taking only his lasix. Pt has verbalized understanding and had no additional questions.

## 2018-03-22 NOTE — Telephone Encounter (Signed)
Called pt back. He states the increase in lasix did not help and felt it had the opposite effect with decreased urine output. He is still having sx of fluid overload. Pt understands we will await further recommendation from Dr Caryl Comes on how to proceed.

## 2018-03-25 ENCOUNTER — Other Ambulatory Visit (HOSPITAL_COMMUNITY): Payer: PPO

## 2018-03-25 ENCOUNTER — Other Ambulatory Visit: Payer: PPO

## 2018-03-25 DIAGNOSIS — N401 Enlarged prostate with lower urinary tract symptoms: Secondary | ICD-10-CM | POA: Diagnosis not present

## 2018-03-25 DIAGNOSIS — R350 Frequency of micturition: Secondary | ICD-10-CM | POA: Diagnosis not present

## 2018-03-25 DIAGNOSIS — R972 Elevated prostate specific antigen [PSA]: Secondary | ICD-10-CM | POA: Diagnosis not present

## 2018-03-25 DIAGNOSIS — N200 Calculus of kidney: Secondary | ICD-10-CM | POA: Diagnosis not present

## 2018-03-26 NOTE — Telephone Encounter (Signed)
Per Dr Caryl Comes, pt is to keep medication the same for now. He would like to see pt's echo report, scheduled for next week, before making any additional changes. Pt states he is not SOB, just has pedal swelling. Pt has verbalized understanding and had no additional questions.

## 2018-03-30 ENCOUNTER — Other Ambulatory Visit: Payer: PPO

## 2018-03-30 ENCOUNTER — Ambulatory Visit (HOSPITAL_COMMUNITY): Payer: PPO | Attending: Cardiovascular Disease

## 2018-03-30 ENCOUNTER — Other Ambulatory Visit: Payer: Self-pay

## 2018-03-30 DIAGNOSIS — I4819 Other persistent atrial fibrillation: Secondary | ICD-10-CM | POA: Insufficient documentation

## 2018-03-30 DIAGNOSIS — I10 Essential (primary) hypertension: Secondary | ICD-10-CM

## 2018-03-30 DIAGNOSIS — I4892 Unspecified atrial flutter: Secondary | ICD-10-CM | POA: Diagnosis not present

## 2018-03-30 DIAGNOSIS — I471 Supraventricular tachycardia: Secondary | ICD-10-CM | POA: Diagnosis not present

## 2018-03-31 LAB — BASIC METABOLIC PANEL
BUN / CREAT RATIO: 23 (ref 10–24)
BUN: 38 mg/dL — AB (ref 8–27)
CALCIUM: 8.9 mg/dL (ref 8.6–10.2)
CO2: 27 mmol/L (ref 20–29)
Chloride: 102 mmol/L (ref 96–106)
Creatinine, Ser: 1.67 mg/dL — ABNORMAL HIGH (ref 0.76–1.27)
GFR calc non Af Amer: 38 mL/min/{1.73_m2} — ABNORMAL LOW (ref >59)
GFR, EST AFRICAN AMERICAN: 44 mL/min/{1.73_m2} — AB (ref >59)
Glucose: 111 mg/dL — ABNORMAL HIGH (ref 65–99)
POTASSIUM: 3.7 mmol/L (ref 3.5–5.2)
Sodium: 144 mmol/L (ref 134–144)

## 2018-03-31 LAB — CBC
Hematocrit: 36.4 % — ABNORMAL LOW (ref 37.5–51.0)
Hemoglobin: 11.5 g/dL — ABNORMAL LOW (ref 13.0–17.7)
MCH: 24.2 pg — AB (ref 26.6–33.0)
MCHC: 31.6 g/dL (ref 31.5–35.7)
MCV: 77 fL — AB (ref 79–97)
Platelets: 252 10*3/uL (ref 150–450)
RBC: 4.76 x10E6/uL (ref 4.14–5.80)
RDW: 14.9 % (ref 11.6–15.4)
WBC: 7.4 10*3/uL (ref 3.4–10.8)

## 2018-04-01 ENCOUNTER — Encounter: Payer: Self-pay | Admitting: Internal Medicine

## 2018-04-01 ENCOUNTER — Ambulatory Visit (INDEPENDENT_AMBULATORY_CARE_PROVIDER_SITE_OTHER): Payer: PPO | Admitting: Internal Medicine

## 2018-04-01 ENCOUNTER — Ambulatory Visit (HOSPITAL_BASED_OUTPATIENT_CLINIC_OR_DEPARTMENT_OTHER)
Admission: RE | Admit: 2018-04-01 | Discharge: 2018-04-01 | Disposition: A | Discharge status: Home / Self Care | Payer: PPO | Source: Ambulatory Visit | Attending: Internal Medicine | Admitting: Internal Medicine

## 2018-04-01 VITALS — BP 122/70 | HR 57 | Temp 97.5°F | Resp 16 | Ht 68.0 in | Wt 215.5 lb

## 2018-04-01 DIAGNOSIS — Z0000 Encounter for general adult medical examination without abnormal findings: Secondary | ICD-10-CM | POA: Diagnosis not present

## 2018-04-01 DIAGNOSIS — M85851 Other specified disorders of bone density and structure, right thigh: Secondary | ICD-10-CM | POA: Diagnosis not present

## 2018-04-01 DIAGNOSIS — E1169 Type 2 diabetes mellitus with other specified complication: Secondary | ICD-10-CM | POA: Diagnosis not present

## 2018-04-01 DIAGNOSIS — N5089 Other specified disorders of the male genital organs: Secondary | ICD-10-CM | POA: Insufficient documentation

## 2018-04-01 DIAGNOSIS — M85852 Other specified disorders of bone density and structure, left thigh: Secondary | ICD-10-CM | POA: Diagnosis not present

## 2018-04-01 DIAGNOSIS — M8580 Other specified disorders of bone density and structure, unspecified site: Secondary | ICD-10-CM | POA: Diagnosis not present

## 2018-04-01 LAB — CBC WITH DIFFERENTIAL/PLATELET
BASOS ABS: 0 10*3/uL (ref 0.0–0.1)
BASOS PCT: 0.5 % (ref 0.0–3.0)
EOS ABS: 0.2 10*3/uL (ref 0.0–0.7)
Eosinophils Relative: 1.8 % (ref 0.0–5.0)
HCT: 39.7 % (ref 39.0–52.0)
Hemoglobin: 12.4 g/dL — ABNORMAL LOW (ref 13.0–17.0)
LYMPHS PCT: 21.3 % (ref 12.0–46.0)
Lymphs Abs: 2 10*3/uL (ref 0.7–4.0)
MCHC: 31.1 g/dL (ref 30.0–36.0)
MCV: 77.5 fl — ABNORMAL LOW (ref 78.0–100.0)
MONO ABS: 0.6 10*3/uL (ref 0.1–1.0)
Monocytes Relative: 6.5 % (ref 3.0–12.0)
Neutro Abs: 6.6 10*3/uL (ref 1.4–7.7)
Neutrophils Relative %: 69.9 % (ref 43.0–77.0)
Platelets: 267 10*3/uL (ref 150.0–400.0)
RBC: 5.13 Mil/uL (ref 4.22–5.81)
RDW: 16.8 % — AB (ref 11.5–15.5)
WBC: 9.4 10*3/uL (ref 4.0–10.5)

## 2018-04-01 LAB — LIPID PANEL
CHOL/HDL RATIO: 4
Cholesterol: 149 mg/dL (ref 0–200)
HDL: 35.9 mg/dL — ABNORMAL LOW (ref >39.00)
NONHDL: 113.52
Triglycerides: 203 mg/dL — ABNORMAL HIGH (ref 0.0–149.0)
VLDL: 40.6 mg/dL — AB (ref 0.0–40.0)

## 2018-04-01 LAB — FERRITIN: FERRITIN: 7.1 ng/mL — AB (ref 22.0–322.0)

## 2018-04-01 LAB — LDL CHOLESTEROL, DIRECT: Direct LDL: 70 mg/dL

## 2018-04-01 LAB — IRON: IRON: 46 ug/dL (ref 42–165)

## 2018-04-01 LAB — HEMOGLOBIN A1C: HEMOGLOBIN A1C: 6.6 % — AB (ref 4.6–6.5)

## 2018-04-01 LAB — MAGNESIUM: MAGNESIUM: 2.2 mg/dL (ref 1.5–2.5)

## 2018-04-01 NOTE — Patient Instructions (Addendum)
Please schedule Medicare Wellness visit with Glenard Haring.   GO TO THE LAB : Get the blood work     GO TO THE FRONT DESK Schedule your next appointment for a follow-up in 8 weeks   Continue weighting yourself daily  Schedule your bone density test on the first floor

## 2018-04-01 NOTE — Progress Notes (Signed)
Pre visit review using our clinic review tool, if applicable. No additional management support is needed unless otherwise documented below in the visit note. 

## 2018-04-01 NOTE — Assessment & Plan Note (Signed)
TD 09-2015; Pneumonia shot-- again declined ;s/p zostavax , shingrix discussed   Had a flu shot -[facility]: Cscope  03-2006, Bx adenomatous polyp (Dr Sharlett Iles) [place] again 12-2011--- no further scopes needed  -Elevated  PSA: See assessment and plan - has a  healthy diet, will not exercise (personal decision)

## 2018-04-01 NOTE — Assessment & Plan Note (Signed)
DM: Diet controlled, check A1c HTN: Controlled on Lotensin, Lasix, metoprolol, Procardia. Atrial tachycardia/flutter, saw cardiology 03/19/2018, he was felt to be slightly volume overloaded, Lasix dose increased, subsequently creatinine was 1.67, slightly above baseline.  Was recommended to return in 6 months.Echocardiogram was excellent on 03/31/2018. Was also prescribed Zaroxolyn 03/22/2018, took it one time, per the patient's own scales he has lost approximately 3 pounds, edema of the right leg has improved but not on the left.  I wonder if he could take 1 Zaroxolyn weekly.  Will message cardiology. Lower extremity edema: See above Osteopenia: Check a bone density test.  Patient is not active by choice, recommend vitamin D supplements BPH, ED, elevated PSA: Last visit with urology 03/25/2018.  He was felt to be stable, return to the office in 1 year Anemia: Last hemoglobin decreased, no GI symptoms.  Recheck a hemoglobin, iron and ferritin. GERD: Well controlled on PPIs.  Check a magnesium level CT chest-2019 discussed, elected no further  CTs RTC 8 weeks

## 2018-04-01 NOTE — Progress Notes (Signed)
Subjective:    Patient ID: Ronald [name], male    DOB: Feb 25, 1935, 84 y.o.   MRN: [id number]  DOS:  04/01/2018 Type of visit - description: CPX He reports gradual increase in DOE.  Saw cardiology, felt to be volume overloaded.  Note reviewed.   Wt Readings from Last 3 Encounters:  04/01/18 215 lb 8 oz (97.8 kg)  03/19/18 216 lb (98 kg)  11/18/17 212 lb (96.2 kg)     Review of Systems  Other than above, a 14 point review of systems is negative   Past Medical History:  Diagnosis Date  . BPH (benign prostatic hypertrophy)    f/u @ Biopsy, s/p bx 2009 apro (-)  . Calculus of kidney    s/p R percutaneus nephrostomy @ [facility]  . Cataract   . Colon polyp   . Complication of anesthesia    violent upon waking up  . Diabetes mellitus    pre diabetic  . Diverticulosis   . Edema leg    asymetric , L > R , [place] neg for DVT 09-2010  . GERD (gastroesophageal reflux disease)   . Hemorrhoids   . Hypercalcemia    parathyroidectomy 04/2008  . Hyperlipidemia   . Hypertension   . Iron deficiency anemia     Past Surgical History:  Procedure Laterality Date  . A-FLUTTER ABLATION N/A 08/12/2017   Procedure: A-FLUTTER ABLATION;  Surgeon: Deboraha Sprang, MD;  Location: [facility];  Service: Cardiovascular;  Laterality: N/A;  . APPENDECTOMY    . CATARACT EXTRACTION Right   . COLONOSCOPY  12/16/2011   Procedure: COLONOSCOPY;  Surgeon: Inda Castle, MD;  Location: [facility];  Service: Endoscopy;  Laterality: N/A;  . ESOPHAGOGASTRODUODENOSCOPY N/A 01/17/2013   Procedure: ESOPHAGOGASTRODUODENOSCOPY (EGD);  Surgeon: Inda Castle, MD;  Location: [facility];  Service: Endoscopy;  Laterality: N/A;  . EXTERNAL EAR SURGERY  01/2016   BCC removal  . EYE SURGERY  infant   "lazy eye"  . FLEXIBLE SIGMOIDOSCOPY  02/09/2012   Procedure: FLEXIBLE SIGMOIDOSCOPY;  Surgeon: Inda Castle, MD;  Location: [facility];  Service: Endoscopy;  Laterality: N/A;  . HEMORRHOID BANDING   02/09/2012   Procedure: HEMORRHOID BANDING;  Surgeon: Inda Castle, MD;  Location: [facility];  Service: Endoscopy;  Laterality: N/A;  . HOT HEMOSTASIS N/A 01/17/2013   Procedure: HOT HEMOSTASIS (ARGON PLASMA COAGULATION/BICAP);  Surgeon: Inda Castle, MD;  Location: [facility];  Service: Endoscopy;  Laterality: N/A;  . KIDNEY STONE SURGERY    . PARATHYROIDECTOMY  04/2008  . TONSILLECTOMY      Social History   Socioeconomic History  . Marital status: Widowed    Spouse name: Not on file  . Number of children: 3  . Years of education: Not on file  . Highest education level: Not on file  Occupational History  . Occupation: retired   Scientific laboratory technician  . Financial resource strain: Not on file  . Food insecurity:    Worry: Not on file    Inability: Not on file  . Transportation needs:    Medical: Not on file    Non-medical: Not on file  Tobacco Use  . Smoking status: Former Smoker    Years: 25.00    Types: Cigarettes    Last attempt to quit: 11/30/1981    Years since quitting: 36.3  . Smokeless tobacco: Never Used  Substance and Sexual Activity  . Alcohol use: Yes    Alcohol/week: 7.0 standard  drinks    Types: 7 Glasses of wine per week    Comment: 1 glass of wine or beer with dinner  . Drug use: No  . Sexual activity: Not on file  Lifestyle  . Physical activity:    Days per week: Not on file    Minutes per session: Not on file  . Stress: Not on file  Relationships  . Social connections:    Talks on phone: Not on file    Gets together: Not on file    Attends religious service: Not on file    Active member of club or organization: Not on file    Attends meetings of clubs or organizations: Not on file    Relationship status: Not on file  . Intimate partner violence:    Fear of current or ex partner: Not on file    Emotionally abused: Not on file    Physically abused: Not on file    Forced sexual activity: Not on file  Other Topics Concern  . Not on file  Social  History Narrative   Retired, single, has  a girlfriend , 3 children  (one lives in town)   Daughter has a copy of his living will     Family History  Problem Relation Age of Onset  . Heart attack Father 7  . Heart attack Mother   . Colon cancer Neg Hx   . Prostate cancer Neg Hx   . Diabetes Neg Hx      Allergies as of 04/01/2018      Reactions   Cucumber Extract Nausea And Vomiting   Sulfonamide Derivatives Other (See Comments)   "don't know reaction" per pt      Medication List       Accurate as of April 01, 2018  3:46 PM. Always use your most recent med list.        benazepril 20 MG tablet Commonly known as:  LOTENSIN Take 1 tablet (20 mg total) by mouth daily.   cholecalciferol 1000 units tablet Commonly known as:  VITAMIN D Take 1,000 Units by mouth daily.   ELIQUIS 5 MG Tabs tablet Generic drug:  apixaban TAKE 1 TABLET BY MOUTH TWICE DAILY   flecainide 50 MG tablet Commonly known as:  TAMBOCOR Take 1 tablet (50 mg total) every morning and take 2 tablets (100 mg total) every evening.   furosemide 40 MG tablet Commonly known as:  LASIX Take 1 tablet (40 mg total) by mouth daily.   metolazone 5 MG tablet Commonly known as:  ZAROXOLYN Take 5mg , one tablet, once for fluid management.   metoprolol succinate 50 MG 24 hr tablet Commonly known as:  TOPROL-XL Take 1 tablet (50 mg total) by mouth daily.   NIFEdipine 60 MG 24 hr tablet Commonly known as:  PROCARDIA XL/NIFEDICAL XL Take 1 tablet (60 mg total) by mouth daily.   omeprazole 20 MG capsule Commonly known as:  PRILOSEC TAKE 1 CAPSULE BY MOUTH ONCE DAILY   PRESERVISION AREDS 2 Caps Take 1 capsule by mouth daily.   Saw Palmetto 500 MG Caps Take 1 capsule by mouth daily.   simvastatin 40 MG tablet Commonly known as:  ZOCOR Take 1 tablet (40 mg total) by mouth at bedtime.           Objective:   Physical Exam BP 122/70 (BP Location: Left Arm, Patient Position: Sitting, Cuff Size:  Small)   Pulse (!) 57   Temp (!) 97.5 F (36.4 C) (Oral)  Resp 16   Ht 5\' 8"  (1.727 m)   Wt 215 lb 8 oz (97.8 kg)   SpO2 96%   BMI 32.77 kg/m  General: Well developed, NAD, BMI noted Neck: No  thyromegaly.  Good carotid pulses HEENT:  Normocephalic . Face symmetric, atraumatic Lungs:  CTA B Normal respiratory effort, no intercostal retractions, no accessory muscle use. Heart: RRR,  no murmur.  +/+++ Pitting edema, left leg is larger, chronic issue Abdomen:  Not distended, soft, non-tender. No rebound or rigidity.   Skin: Exposed areas without rash. Not pale. Not jaundice Neurologic:  alert & oriented X3.  Speech normal, gait appropriate for age and unassisted Strength symmetric and appropriate for age.  Psych: Cognition and judgment appear intact.  Cooperative with normal attention span and concentration.  Behavior appropriate. No anxious or depressed appearing.     Assessment     Assessment   DM HTN Hyperlipidemia Osteopenia T score -2.0 (2010), T score -1.6 (12-2014) LEG Edema,L>R , chronic, [place] neg DVT 2012 CV: Atrial tachycardia/flutter DX 02/2016 GU: --BPH  --ED --Elevated PSA, s/p multiple bx (last 2009?neg), last OV w/ urology 2014, last  PSA 2014 ~ 13, declines further eval as off 02-2016 Hypercalcemia, parathyroidectomy 2010 Renal stones, nephrostomy 2009 at [place]:  --Anemia: EGD 12-2012  (Bx: H pylory neg) [id number] gastric polyposis, Bx H Pylory +, was rec treatment Anemia felt to be d/t chronic bleed from  gastric polyp H Pylori + 11-2014, treated, f/u breath test (-)  --GERD --cscope 12-2011 >>> multiple hyperplastic  Polyps, tics  --12-2011 Banding internal hemorrhoids 4 --02-2012: Flex sig d/t discomfort  BCC , removed from L ear CT abd 06/2017 @ urology: RML 1.7 cm nodule, gallbladder stone, hepatic steatosis.  PLAN DM: Diet controlled, check A1c HTN: Controlled on Lotensin, Lasix, metoprolol, Procardia. Atrial  tachycardia/flutter, saw cardiology 03/19/2018, he was felt to be slightly volume overloaded, Lasix dose increased, subsequently creatinine was 1.67, slightly above baseline.  Was recommended to return in 6 months.Echocardiogram was excellent on 03/31/2018. Was also prescribed Zaroxolyn 03/22/2018, took it one time, per the patient's own scales he has lost approximately 3 pounds, edema of the right leg has improved but not on the left.  I wonder if he could take 1 Zaroxolyn weekly.  Will message cardiology. Lower extremity edema: See above Osteopenia: Check a bone density test.  Patient is not active by choice, recommend vitamin D supplements BPH, ED, elevated PSA: Last visit with urology 03/25/2018.  He was felt to be stable, return to the office in 1 year Anemia: Last hemoglobin decreased, no GI symptoms.  Recheck a hemoglobin, iron and ferritin. GERD: Well controlled on PPIs.  Check a magnesium level CT chest-2019 discussed, elected no further  CTs RTC 8 weeks

## 2018-04-05 ENCOUNTER — Encounter: Payer: Self-pay | Admitting: Internal Medicine

## 2018-04-05 MED ORDER — IRON 325 (65 FE) MG PO TABS: 325.0000 mg | ORAL_TABLET | Freq: Two times a day (BID) | ORAL | 6 refills | Status: DC

## 2018-04-05 NOTE — Addendum Note (Signed)
Addended byDamita Dunnings D on: 04/05/2018 04:41 PM   Modules accepted: Orders

## 2018-04-07 ENCOUNTER — Other Ambulatory Visit: Payer: Self-pay | Admitting: Internal Medicine

## 2018-04-07 ENCOUNTER — Encounter: Payer: Self-pay | Admitting: Internal Medicine

## 2018-04-07 MED ORDER — METOLAZONE 5 MG PO TABS: 5.0000 mg | ORAL_TABLET | ORAL | 0 refills | Status: DC

## 2018-04-09 ENCOUNTER — Telehealth: Payer: Self-pay | Admitting: Internal Medicine

## 2018-04-09 ENCOUNTER — Telehealth: Payer: Self-pay

## 2018-04-09 DIAGNOSIS — E876 Hypokalemia: Secondary | ICD-10-CM

## 2018-04-09 NOTE — Telephone Encounter (Signed)
Dr Caryl Comes has requested pt come by next week for a BNP. Pt has decided to have his BMP and BNP drawn at the same time. He will call his PCP to discuss potassium supplement.

## 2018-04-09 NOTE — Telephone Encounter (Signed)
Spoke with pt regarding his new metolazone prescription. Per Dr Caryl Comes, pt should follow up with his PCP to discuss K supplement and/or a repeat BMP since pt is planning on taking medication weekly. Pt has agreed to contact his PCP office and has no additional questions.

## 2018-04-09 NOTE — Telephone Encounter (Signed)
Copied from [place] [id number]. Topic: Quick Communication - See Telephone Encounter >> Apr 09, 2018  2:41 PM Vernona Rieger wrote: CRM for notification. See Telephone encounter for: 04/09/18.  Patient states that Dr Caryl Comes recommends he be put on a potassium pill. They think because he is on metolazone (ZAROXOLYN) 5 MG tablet he would need that along with this. Please Advise.

## 2018-04-09 NOTE — Addendum Note (Signed)
Addended by: Dollene Primrose on: 04/09/2018 02:52 PM   Modules accepted: Orders

## 2018-04-12 ENCOUNTER — Encounter: Payer: Self-pay | Admitting: Internal Medicine

## 2018-04-12 MED ORDER — POTASSIUM CHLORIDE ER 10 MEQ PO TBCR: 10.0000 meq | EXTENDED_RELEASE_TABLET | ORAL | 1 refills | Status: DC

## 2018-04-12 NOTE — Telephone Encounter (Signed)
Rx sent 

## 2018-04-12 NOTE — Telephone Encounter (Signed)
He will take Zaroxolyn 1 time weekly. Call KCl 10 mEq 1 p.o. weekly  #30 Only to be taken in conjunction with Zaroxolyn

## 2018-04-16 ENCOUNTER — Other Ambulatory Visit: Payer: PPO | Admitting: *Deleted

## 2018-04-16 DIAGNOSIS — E876 Hypokalemia: Secondary | ICD-10-CM | POA: Diagnosis not present

## 2018-04-19 LAB — BASIC METABOLIC PANEL
BUN/Creatinine Ratio: 19 (ref 10–24)
BUN: 33 mg/dL — AB (ref 8–27)
CHLORIDE: 98 mmol/L (ref 96–106)
CO2: 27 mmol/L (ref 20–29)
Calcium: 9.1 mg/dL (ref 8.6–10.2)
Creatinine, Ser: 1.7 mg/dL — ABNORMAL HIGH (ref 0.76–1.27)
GFR, EST AFRICAN AMERICAN: 43 mL/min/{1.73_m2} — AB (ref >59)
GFR, EST NON AFRICAN AMERICAN: 37 mL/min/{1.73_m2} — AB (ref >59)
Glucose: 127 mg/dL — ABNORMAL HIGH (ref 65–99)
Potassium: 3.6 mmol/L (ref 3.5–5.2)
Sodium: 142 mmol/L (ref 134–144)

## 2018-04-19 LAB — BRAIN NATRIURETIC PEPTIDE: BNP: 33.9 pg/mL (ref 0.0–100.0)

## 2018-04-21 ENCOUNTER — Encounter: Payer: Self-pay | Admitting: Internal Medicine

## 2018-05-28 ENCOUNTER — Encounter: Payer: Self-pay | Admitting: Internal Medicine

## 2018-05-28 ENCOUNTER — Ambulatory Visit (INDEPENDENT_AMBULATORY_CARE_PROVIDER_SITE_OTHER): Payer: PPO | Admitting: Internal Medicine

## 2018-05-28 ENCOUNTER — Other Ambulatory Visit: Payer: Self-pay

## 2018-05-28 VITALS — BP 126/62 | HR 65 | Temp 98.1°F | Resp 16 | Ht 68.0 in | Wt 213.1 lb

## 2018-05-28 DIAGNOSIS — D649 Anemia, unspecified: Secondary | ICD-10-CM

## 2018-05-28 DIAGNOSIS — I10 Essential (primary) hypertension: Secondary | ICD-10-CM

## 2018-05-28 DIAGNOSIS — E1169 Type 2 diabetes mellitus with other specified complication: Secondary | ICD-10-CM

## 2018-05-28 LAB — COMPREHENSIVE METABOLIC PANEL
ALT: 14 U/L (ref 0–53)
AST: 11 U/L (ref 0–37)
Albumin: 3.6 g/dL (ref 3.5–5.2)
Alkaline Phosphatase: 65 U/L (ref 39–117)
BUN: 38 mg/dL — AB (ref 6–23)
CHLORIDE: 98 meq/L (ref 96–112)
CO2: 33 mEq/L — ABNORMAL HIGH (ref 19–32)
Calcium: 8.9 mg/dL (ref 8.4–10.5)
Creatinine, Ser: 1.77 mg/dL — ABNORMAL HIGH (ref 0.40–1.50)
GFR: 37.04 mL/min — ABNORMAL LOW (ref >60.00)
GLUCOSE: 103 mg/dL — AB (ref 70–99)
POTASSIUM: 3.9 meq/L (ref 3.5–5.1)
SODIUM: 140 meq/L (ref 135–145)
Total Bilirubin: 0.4 mg/dL (ref 0.2–1.2)
Total Protein: 6.2 g/dL (ref 6.0–8.3)

## 2018-05-28 LAB — CBC WITH DIFFERENTIAL/PLATELET
BASOS PCT: 0.6 % (ref 0.0–3.0)
Basophils Absolute: 0.1 10*3/uL (ref 0.0–0.1)
EOS PCT: 1.8 % (ref 0.0–5.0)
Eosinophils Absolute: 0.2 10*3/uL (ref 0.0–0.7)
HCT: 39.5 % (ref 39.0–52.0)
Hemoglobin: 12.7 g/dL — ABNORMAL LOW (ref 13.0–17.0)
LYMPHS ABS: 2.3 10*3/uL (ref 0.7–4.0)
Lymphocytes Relative: 23.1 % (ref 12.0–46.0)
MCHC: 32.3 g/dL (ref 30.0–36.0)
MCV: 76.8 fl — ABNORMAL LOW (ref 78.0–100.0)
MONO ABS: 1 10*3/uL (ref 0.1–1.0)
Monocytes Relative: 9.9 % (ref 3.0–12.0)
NEUTROS PCT: 64.6 % (ref 43.0–77.0)
Neutro Abs: 6.4 10*3/uL (ref 1.4–7.7)
PLATELETS: 278 10*3/uL (ref 150.0–400.0)
RBC: 5.14 Mil/uL (ref 4.22–5.81)
RDW: 19.9 % — AB (ref 11.5–15.5)
WBC: 9.9 10*3/uL (ref 4.0–10.5)

## 2018-05-28 MED ORDER — METOLAZONE 5 MG PO TABS: 5.0000 mg | ORAL_TABLET | ORAL | 0 refills | Status: DC

## 2018-05-28 NOTE — Progress Notes (Signed)
Pre visit review using our clinic review tool, if applicable. No additional management support is needed unless otherwise documented below in the visit note. 

## 2018-05-28 NOTE — Assessment & Plan Note (Signed)
DM: Diet controlled, last A1c satisfactory HTN: Continue Lotensin, Lasix, potassium, metoprolol.  He also takes Zaroxolyn once a week for edema control.  We will continue with that.  Rx sent.  Check a CMP. Atrial tachycardia/flutter: Anticoagulated on Eliquis Lower extremity edema: Seems well controlled with current regimen.  See comments under HTN Osteopenia: T score -1.3 03-2018. Anemia: Found to have anemia, iron deficiency, intolerant to FeSO4, will try iron polysaccharide.  Refer to GI.  He has a history of H. pylori, gastric polyp etc. :  should we attempt a endoscopy?  Pt has no gross blood in the stools or GI symptoms other than iron s/e. RTC 3 to 4 months.

## 2018-05-28 NOTE — Progress Notes (Signed)
Subjective:    Patient ID: Ronald [name], male    DOB: July 03, 1934, 84 y.o.   MRN: [id number]  DOS:  05/28/2018 Type of visit - description: rov He is doing okay.  Good medical compliance. LE  swelling is at baseline. No ambulatory BPs. He was prescribed iron for anemia, took it w/ food but is intolerant.  Had GI side effects. Bone density test was done, results reviewed with the patient.  Wt Readings from Last 3 Encounters:  05/28/18 213 lb 2 oz (96.7 kg)  04/01/18 215 lb 8 oz (97.8 kg)  03/19/18 216 lb (98 kg)     Review of Systems  Denies chest pain no difficulty breathing.  No palpitation No nausea, vomiting, diarrhea.  No bloody stools.  No abdominal pain  Past Medical History:  Diagnosis Date  . BPH (benign prostatic hypertrophy)    f/u @ Biopsy, s/p bx 2009 apro (-)  . Calculus of kidney    s/p R percutaneus nephrostomy @ [facility]  . Cataract   . Colon polyp   . Complication of anesthesia    violent upon waking up  . Diabetes mellitus    pre diabetic  . Diverticulosis   . Edema leg    asymetric , L > R , [place] neg for DVT 09-2010  . GERD (gastroesophageal reflux disease)   . Hemorrhoids   . Hypercalcemia    parathyroidectomy 04/2008  . Hyperlipidemia   . Hypertension   . Iron deficiency anemia     Past Surgical History:  Procedure Laterality Date  . A-FLUTTER ABLATION N/A 08/12/2017   Procedure: A-FLUTTER ABLATION;  Surgeon: Deboraha Sprang, MD;  Location: [facility];  Service: Cardiovascular;  Laterality: N/A;  . APPENDECTOMY    . CATARACT EXTRACTION Right   . COLONOSCOPY  12/16/2011   Procedure: COLONOSCOPY;  Surgeon: Inda Castle, MD;  Location: [facility];  Service: Endoscopy;  Laterality: N/A;  . ESOPHAGOGASTRODUODENOSCOPY N/A 01/17/2013   Procedure: ESOPHAGOGASTRODUODENOSCOPY (EGD);  Surgeon: Inda Castle, MD;  Location: [facility];  Service: Endoscopy;  Laterality: N/A;  . EXTERNAL EAR SURGERY  01/2016   BCC removal  . EYE SURGERY   infant   "lazy eye"  . FLEXIBLE SIGMOIDOSCOPY  02/09/2012   Procedure: FLEXIBLE SIGMOIDOSCOPY;  Surgeon: Inda Castle, MD;  Location: [facility];  Service: Endoscopy;  Laterality: N/A;  . HEMORRHOID BANDING  02/09/2012   Procedure: HEMORRHOID BANDING;  Surgeon: Inda Castle, MD;  Location: [facility];  Service: Endoscopy;  Laterality: N/A;  . HOT HEMOSTASIS N/A 01/17/2013   Procedure: HOT HEMOSTASIS (ARGON PLASMA COAGULATION/BICAP);  Surgeon: Inda Castle, MD;  Location: [facility];  Service: Endoscopy;  Laterality: N/A;  . KIDNEY STONE SURGERY    . PARATHYROIDECTOMY  04/2008  . TONSILLECTOMY      Social History   Socioeconomic History  . Marital status: Widowed    Spouse name: Not on file  . Number of children: 3  . Years of education: Not on file  . Highest education level: Not on file  Occupational History  . Occupation: retired   Scientific laboratory technician  . Financial resource strain: Not on file  . Food insecurity:    Worry: Not on file    Inability: Not on file  . Transportation needs:    Medical: Not on file    Non-medical: Not on file  Tobacco Use  . Smoking status: Former Smoker    Years: 25.00    Types:  Cigarettes    Last attempt to quit: 11/30/1981    Years since quitting: 36.5  . Smokeless tobacco: Never Used  Substance and Sexual Activity  . Alcohol use: Yes    Alcohol/week: 7.0 standard drinks    Types: 7 Glasses of wine per week    Comment: 1 glass of wine or beer with dinner  . Drug use: No  . Sexual activity: Not on file  Lifestyle  . Physical activity:    Days per week: Not on file    Minutes per session: Not on file  . Stress: Not on file  Relationships  . Social connections:    Talks on phone: Not on file    Gets together: Not on file    Attends religious service: Not on file    Active member of club or organization: Not on file    Attends meetings of clubs or organizations: Not on file    Relationship status: Not on file  . Intimate  partner violence:    Fear of current or ex partner: Not on file    Emotionally abused: Not on file    Physically abused: Not on file    Forced sexual activity: Not on file  Other Topics Concern  . Not on file  Social History Narrative   Retired, single, has  a girlfriend , 3 children  (one lives in town)   Daughter has a copy of his living will      Allergies as of 05/28/2018      Reactions   Cucumber Extract Nausea And Vomiting   Sulfonamide Derivatives Other (See Comments)   "don't know reaction" per pt      Medication List       Accurate as of May 28, 2018 10:13 AM. Always use your most recent med list.        benazepril 20 MG tablet Commonly known as:  LOTENSIN Take 1 tablet (20 mg total) by mouth daily.   cholecalciferol 1000 units tablet Commonly known as:  VITAMIN D Take 1,000 Units by mouth daily.   Eliquis 5 MG Tabs tablet Generic drug:  apixaban TAKE 1 TABLET BY MOUTH TWICE DAILY   flecainide 50 MG tablet Commonly known as:  TAMBOCOR Take 1 tablet (50 mg total) every morning and take 2 tablets (100 mg total) every evening.   furosemide 40 MG tablet Commonly known as:  LASIX Take 1 tablet (40 mg total) by mouth daily.   Iron 325 (65 Fe) MG Tabs Take 1 tablet (325 mg total) by mouth 2 (two) times daily.   metolazone 5 MG tablet Commonly known as:  ZAROXOLYN Take 1 tablet (5 mg total) by mouth once a week. Take 1 tablet weekly as needed for swelling   metoprolol succinate 50 MG 24 hr tablet Commonly known as:  TOPROL-XL Take 1 tablet (50 mg total) by mouth daily.   NIFEdipine 60 MG 24 hr tablet Commonly known as:  PROCARDIA XL/NIFEDICAL XL Take 1 tablet (60 mg total) by mouth daily.   omeprazole 20 MG capsule Commonly known as:  PRILOSEC TAKE 1 CAPSULE BY MOUTH ONCE DAILY   potassium chloride 10 MEQ tablet Commonly known as:  K-DUR Take 1 tablet (10 mEq total) by mouth once a week. To be taken only in conjunction with Zaroxolyn    PreserVision AREDS 2 Caps Take 1 capsule by mouth daily.   Saw Palmetto 500 MG Caps Take 1 capsule by mouth daily.   simvastatin 40 MG tablet  Commonly known as:  ZOCOR Take 1 tablet (40 mg total) by mouth at bedtime.           Objective:   Physical Exam BP 126/62 (BP Location: Left Arm, Patient Position: Sitting, Cuff Size: Small)   Pulse 65   Temp 98.1 F (36.7 C) (Oral)   Resp 16   Ht 5\' 8"  (1.727 m)   Wt 213 lb 2 oz (96.7 kg)   SpO2 98%   BMI 32.41 kg/m  General: Well developed, NAD, BMI noted HEENT:  Normocephalic . Face symmetric, atraumatic Lungs:  CTA B Normal respiratory effort, no intercostal retractions, no accessory muscle use. Heart: RRR,  no murmur.  +/+++ Pitting edema, left leg is larger, chronic issue Skin: Exposed areas without rash. Not pale. Not jaundice Neurologic:  alert & oriented X3.  Speech normal, gait appropriate for age and unassisted Strength symmetric and appropriate for age.  Psych: Cognition and judgment appear intact.  Cooperative with normal attention span and concentration.  Behavior appropriate. No anxious or depressed appearing.     Assessment     Assessment   DM HTN Hyperlipidemia Osteopenia T score -2.0 (2010), T score -1.6 (12-2014); T score -1.3 (03-2018). LEG Edema,L>R , chronic, [place] neg DVT 2012 CV: Atrial tachycardia/flutter DX 02/2016 GU: --BPH  --ED --Elevated PSA, s/p multiple bx (last 2009?neg), last OV w/ urology 2014, last  PSA 2014 ~ 13, declines further eval as off 02-2016 Hypercalcemia, parathyroidectomy 2010 Renal stones, nephrostomy 2009 at [place]:  --Anemia: EGD 12-2012  (Bx: H pylory neg) [id number] gastric polyposis, Bx H Pylory +, was rec treatment Anemia felt to be d/t chronic bleed from  gastric polyp H Pylori + 11-2014, treated, f/u breath test (-)  --GERD --cscope 12-2011 >>> multiple hyperplastic  Polyps, tics  --12-2011 Banding internal hemorrhoids 4 --02-2012: Flex sig d/t  discomfort  BCC , removed from L ear CT abd 06/2017 @ urology: RML 1.7 cm nodule, gallbladder stone, hepatic steatosis.  PLAN DM: Diet controlled, last A1c satisfactory HTN: Continue Lotensin, Lasix, potassium, metoprolol.  He also takes Zaroxolyn once a week for edema control.  We will continue with that.  Rx sent.  Check a CMP. Atrial tachycardia/flutter: Anticoagulated on Eliquis Lower extremity edema: Seems well controlled with current regimen.  See comments under HTN Osteopenia: T score -1.3 03-2018. Anemia: Found to have anemia, iron deficiency, intolerant to FeSO4, will try iron polysaccharide.  Refer to GI.  He has a history of H. pylori, gastric polyp etc. :  should we attempt a endoscopy?  Pt has no gross blood in the stools or GI symptoms other than iron s/e. RTC 3 to 4 months.

## 2018-05-28 NOTE — Patient Instructions (Signed)
GO TO THE LAB : Get the blood work     GO TO THE FRONT DESK Schedule your next appointment   For a check up in 3-4  Months   Take OTC "iron polysaccharide" (Niferex, 60 mg, 150 mg) Try at least 150 a day with food

## 2018-05-30 ENCOUNTER — Other Ambulatory Visit: Payer: Self-pay | Admitting: Internal Medicine

## 2018-05-30 DIAGNOSIS — I10 Essential (primary) hypertension: Secondary | ICD-10-CM

## 2018-06-02 ENCOUNTER — Other Ambulatory Visit: Payer: Self-pay

## 2018-06-02 ENCOUNTER — Telehealth: Payer: PPO | Admitting: Gastroenterology

## 2018-06-02 DIAGNOSIS — D509 Iron deficiency anemia, unspecified: Secondary | ICD-10-CM

## 2018-06-02 NOTE — Progress Notes (Addendum)
Referring Provider: Colon Branch, MD Primary Care Physician:  Colon Branch, MD  Tele-visit due to COVID-19 pandemic Patient requested visit virtually, consented to the encounter by telephone WebEx was attempted but we were unable to connect Patient seems awareoflimitations, risks, security and privacy concerns of performing an evaluation and management service by telephone and the risk andavailability of in person appointments Contact made at:11:15 06/02/2018 Patient verified by name and date of birth Location of patient: Home Location provider:[facility] Names of persons participating:Me, patient Time spent on call:25 minutes  Reason for Consultation:  Anemia   IMPRESSION:  Iron deficiency anemia without overt or occult GI blood    - 09/17/17: hgb 14.0     - 03/30/18: hgb 11.5    - 04/01/18: iron 46, ferritin 7.1    - 05/28/18: hgb 12.7, MCV 76.8, RDW 19 History of anemia attributed to gastric hyperplastic polyps 2014 Atrial tachycardia anticoagulated on Eliquis H pylori positive on EGD 01/27/13    - treated with Prevpac    - breath test negative 2016 History of colon polyps    - tubular adenoma and hyperplastic polyp 2008    - hyperplastic polyps 2014 History of bleeding internal hemorrhoids s/p banding 2014  Suspected bleeding from gastric polyps in the setting of Eliquis. EGD recommended.  Patient refuses all further colonoscopy and notes that he will continue to refuse colonoscopy even if his EGD is non-diagnostic.   I have recommended holding Eliquis for 2 days before endoscopy.  I discussed with the patient that there is a low, but real, risk of a cardiovascular event such as heart attack, stroke, or embolism/thrombosis while off Eliquis. Will communicate by phone or EMR with patient's prescribing provider to confirm that holding the Eliquis is appropriate at this time.   The patient would like to defer his EGD until Covid19 concerns have resolved. He does not  feel it's worth the risk to proceed at this time.    PLAN: EGD at the hospital with Dr. Rush Landmark after an Eliquis washout if approved by Dr. Caryl Comes Hemoglobin follow-up per Dr. Larose Kells Continue with iron supplements as prescribed by Dr. Larose Kells  I consented the patient at the bedside today discussing the risks, benefits, and alternatives to endoscopic evaluation. In particular, we discussed the risks that include, but are not limited to, reaction to medication, cardiopulmonary compromise, bleeding requiring blood transfusion, aspiration resulting in pneumonia, perforation requiring surgery, lack of diagnosis, severe illness requiring hospitalization, and even death. We reviewed the risk of missed lesion including polyps or even cancer. The patient acknowledges these risks and asks that we proceed.  I reviewed the patient's care and prior procedures with Dr. Rush Landmark who agrees to perform the procedure at the hospital.   25 minutes spent with the patient today. Greater than 50% was spent in counseling and coordination of care with the patient. The AVS will be mailed to the patient.    HPI: Ronald [name] is a 84 y.o. male referred by Dr. Larose Kells for further evaluation of anemia. The history is obtained through the patient and review of his electronic health record.  He has atrial tachycardia on Eliquis, diet controlled diabetes, hypertension, and osteopenia.  He was found to have iron deficiency anemia.  He has been intolerant to iron supplements.  He started iron polysaccharide yesterday.  Labs 09/17/17: hgb 14.0  Labs 03/30/18: hgb 11.5 Labs 04/01/18: iron 46, ferritin 7.1 Labs 05/28/18: hgb 12.7, MCV 76.8, RDW 19  No symptoms associated  with the anemia other than some vague weakness that he attributes to being 83. Decline in appetite recently.   No fatigue, weakness, headache, irritability, exercise intolerance, exertional dyspnea, vertigo, or angina pectoris.  No pica.  No beeturia.  No hearing loss.     No overt GI blood loss. No melena, hematochezia, bright red blood per rectum. No epistaxis, hemoptysis, or hematuria. No identified exacerbating or relieving features.  Did not tolerate initial iron. Switched to new formula yesterday.   Girlfriend is a Marine scientist.  He was previously followed by Dr. Deatra Ina for anemia:  EGD October 2014: multiple polyps 5-25mm, source of anemia, H pylori negative  EGD November 2014: multiple bleeding hyperpastic gastric polyps, resection with endoloop complicated by arterial bleed, treated with epi and clip, H pylori positive  H Pylori treated with Prevpac 2014 H pylori breath test negative 2016  Colonoscopy 2008: one tubular adenoma, one hyperplastic polyp Colonoscopy 2013: 4 hyperplastic polyps, diverticulosis,  hemorrhoidal banding Flex sigmoidoscopy December 2013 due to ongoing discomfort: bands seen  CT Chest:    Past Medical History:  Diagnosis Date  . BPH (benign prostatic hypertrophy)    f/u @ Biopsy, s/p bx 2009 apro (-)  . Calculus of kidney    s/p R percutaneus nephrostomy @ [facility]  . Cataract   . Colon polyp   . Complication of anesthesia    violent upon waking up  . Diabetes mellitus    pre diabetic  . Diverticulosis   . Edema leg    asymetric , L > R , [place] neg for DVT 09-2010  . GERD (gastroesophageal reflux disease)   . Hemorrhoids   . Hypercalcemia    parathyroidectomy 04/2008  . Hyperlipidemia   . Hypertension   . Iron deficiency anemia     Past Surgical History:  Procedure Laterality Date  . A-FLUTTER ABLATION N/A 08/12/2017   Procedure: A-FLUTTER ABLATION;  Surgeon: Deboraha Sprang, MD;  Location: [facility];  Service: Cardiovascular;  Laterality: N/A;  . APPENDECTOMY    . CATARACT EXTRACTION Right   . COLONOSCOPY  12/16/2011   Procedure: COLONOSCOPY;  Surgeon: Inda Castle, MD;  Location: [facility];  Service: Endoscopy;  Laterality: N/A;  . ESOPHAGOGASTRODUODENOSCOPY N/A 01/17/2013   Procedure:  ESOPHAGOGASTRODUODENOSCOPY (EGD);  Surgeon: Inda Castle, MD;  Location: [facility];  Service: Endoscopy;  Laterality: N/A;  . EXTERNAL EAR SURGERY  01/2016   BCC removal  . EYE SURGERY  infant   "lazy eye"  . FLEXIBLE SIGMOIDOSCOPY  02/09/2012   Procedure: FLEXIBLE SIGMOIDOSCOPY;  Surgeon: Inda Castle, MD;  Location: [facility];  Service: Endoscopy;  Laterality: N/A;  . HEMORRHOID BANDING  02/09/2012   Procedure: HEMORRHOID BANDING;  Surgeon: Inda Castle, MD;  Location: [facility];  Service: Endoscopy;  Laterality: N/A;  . HOT HEMOSTASIS N/A 01/17/2013   Procedure: HOT HEMOSTASIS (ARGON PLASMA COAGULATION/BICAP);  Surgeon: Inda Castle, MD;  Location: [facility];  Service: Endoscopy;  Laterality: N/A;  . KIDNEY STONE SURGERY    . PARATHYROIDECTOMY  04/2008  . TONSILLECTOMY      Current Outpatient Medications  Medication Sig Dispense Refill  . benazepril (LOTENSIN) 20 MG tablet Take 1 tablet (20 mg total) by mouth daily. 90 tablet 3  . cholecalciferol (VITAMIN D) 1000 UNITS tablet Take 1,000 Units by mouth daily.    Marland Kitchen ELIQUIS 5 MG TABS tablet TAKE 1 TABLET BY MOUTH TWICE DAILY 180 tablet 3  . flecainide (TAMBOCOR) 50 MG tablet Take  1 tablet (50 mg total) every morning and take 2 tablets (100 mg total) every evening. 270 tablet 2  . furosemide (LASIX) 40 MG tablet Take 1 tablet (40 mg total) by mouth daily. 90 tablet 3  . metolazone (ZAROXOLYN) 5 MG tablet Take 1 tablet (5 mg total) by mouth once a week. 30 tablet 0  . metoprolol succinate (TOPROL-XL) 50 MG 24 hr tablet Take 1 tablet (50 mg total) by mouth daily. 90 tablet 3  . Multiple Vitamins-Minerals (PRESERVISION AREDS 2) CAPS Take 1 capsule by mouth daily.    Marland Kitchen NIFEdipine (PROCARDIA XL/NIFEDICAL XL) 60 MG 24 hr tablet Take 1 tablet (60 mg total) by mouth daily. 90 tablet 1  . omeprazole (PRILOSEC) 20 MG capsule TAKE 1 CAPSULE BY MOUTH ONCE DAILY 90 capsule 3  . potassium chloride (K-DUR) 10 MEQ tablet TAKE 1 TABLET  BY MOUTH ONCE A WEEK .  TO  BE  TAKEN  ONLY  IN  CONJUNCTION  WITH  ZAROXOLYN 4 tablet 3  . Saw Palmetto 500 MG CAPS Take 1 capsule by mouth daily.    . simvastatin (ZOCOR) 40 MG tablet Take 1 tablet (40 mg total) by mouth at bedtime. 90 tablet 3   No current facility-administered medications for this visit.     Allergies as of 06/02/2018 - Review Complete 05/28/2018  Allergen Reaction Noted  . Cucumber extract Nausea And Vomiting 10/06/2011  . Sulfonamide derivatives Other (See Comments) 02/09/2007    Family History  Problem Relation Age of Onset  . Heart attack Father 37  . Heart attack Mother   . Colon cancer Neg Hx   . Prostate cancer Neg Hx   . Diabetes Neg Hx     Social History   Socioeconomic History  . Marital status: Widowed    Spouse name: Not on file  . Number of children: 3  . Years of education: Not on file  . Highest education level: Not on file  Occupational History  . Occupation: retired   Scientific laboratory technician  . Financial resource strain: Not on file  . Food insecurity:    Worry: Not on file    Inability: Not on file  . Transportation needs:    Medical: Not on file    Non-medical: Not on file  Tobacco Use  . Smoking status: Former Smoker    Years: 25.00    Types: Cigarettes    Last attempt to quit: 11/30/1981    Years since quitting: 36.5  . Smokeless tobacco: Never Used  Substance and Sexual Activity  . Alcohol use: Yes    Alcohol/week: 7.0 standard drinks    Types: 7 Glasses of wine per week    Comment: 1 glass of wine or beer with dinner  . Drug use: No  . Sexual activity: Not on file  Lifestyle  . Physical activity:    Days per week: Not on file    Minutes per session: Not on file  . Stress: Not on file  Relationships  . Social connections:    Talks on phone: Not on file    Gets together: Not on file    Attends religious service: Not on file    Active member of club or organization: Not on file    Attends meetings of clubs or organizations:  Not on file    Relationship status: Not on file  . Intimate partner violence:    Fear of current or ex partner: Not on file    Emotionally abused:  Not on file    Physically abused: Not on file    Forced sexual activity: Not on file  Other Topics Concern  . Not on file  Social History Narrative   Retired, single, has  a girlfriend , 3 children  (one lives in town)   Daughter has a copy of his living will    Review of Systems: 12 system ROS is negative except as noted above.   Physical Exam: General: Pleasant. No obvious distress. Cognition intact. Exam limited in the setting of [place] visit.   Timarion Agcaoili L. Tarri Glenn, MD, MPH [facility] 06/02/2018, 11:09 AM

## 2018-06-02 NOTE — Patient Instructions (Signed)
Hemoglobin follow-up per Dr. Larose Kells Continue with iron supplements as prescribed by Dr. Larose Kells EGD after an Eliquis washout if approved by Dr. Caryl Comes

## 2018-06-06 ENCOUNTER — Other Ambulatory Visit: Payer: Self-pay | Admitting: Internal Medicine

## 2018-06-07 MED ORDER — APIXABAN 2.5 MG PO TABS: 2.5000 mg | ORAL_TABLET | Freq: Two times a day (BID) | ORAL | 2 refills | Status: DC

## 2018-06-07 NOTE — Telephone Encounter (Signed)
-----   Message from Deboraha Sprang, MD sent at 06/07/2018  2:40 PM EDT ----- Regarding: RE: Eliquis dose Thsk   and you are correct steve ----- Message ----- From: Marcos Eke, RN Sent: 06/07/2018  11:48 AM EDT To: Deboraha Sprang, MD, Dollene Primrose, RN Subject: Eliquis dose                                   Dr. Caryl Comes, Pt is currently on Eliquis 5mg  and has requested a refill. The pt is 84 yrs old, wt-96.7kg, Crea-1.77 on 05/28/2018; therefore pt is on incorrect dose of Eliquis as he need to be changed to Eliquis 2.5mg  due to dosing criteria. Please advise and thank you.  Derrel Nip, RN, BSN

## 2018-06-07 NOTE — Telephone Encounter (Signed)
Called pt and made pt aware that the dose needs to change to Eliquis 2.5mg  BID. Reason was explained to the pt and he verbalized understanding. Advised that if he still has any more of the 5mg  tabs left to split them in half and then use the 2.5mg  tabs once he picks them up. He is aware to take 2.5mg  BID.

## 2018-06-07 NOTE — Telephone Encounter (Signed)
Eliquis 5mg  refill request received; pt is 84 yrs old, wt-96.7kg, Crea-1.77 on 05/28/2018, last seen by Dr. Caryl Comes on 03/19/2018; therefore pt is on incorrect dose of Eliquis. Note sent to Dr. Caryl Comes regarding this to change dose.

## 2018-06-14 ENCOUNTER — Telehealth: Payer: Self-pay

## 2018-06-14 NOTE — Telephone Encounter (Signed)
-----   Message from Irving Copas., MD sent at 06/03/2018  2:38 PM EDT ----- Regarding: RE: EGD Impression No problem. Thanks.GM ----- Message ----- From: Thornton Park, MD Sent: 06/03/2018  10:27 AM EDT To: Timothy Lasso, RN, Desiree Royston Bake, CMA, # Subject: RE: EGD Impression                             Gabe,  Many thanks for your assistance with this patient and his complicated endoscopic history.   Joelene Millin ----- Message ----- From: Irving Copas., MD Sent: 06/03/2018   9:51 AM EDT To: Timothy Lasso, RN, Thornton Park, MD Subject: EGD Impression                                 Kimberly,I reviewed the endoscopy reports.  I be happy to be of assistance for the patient and we can plan it as a 90-minute case so that if we need to remove multiple polyps that we can go ahead and do that at that time.Up with Jonavin Seder on here (1 of the nurses is covering for her) that she can be put on my list approximately 6 to 8 weeks for a follow-up EGD to be scheduled in the hospital.Thank you.GM ----- Message ----- From: Thornton Park, MD Sent: 06/02/2018  11:08 PM EDT To: Irving Copas., MD  Gabe, I saw this patient today for unexplained iron deficiency anemia. However, he has had extensive bleeding from gastric hyperplastic polyps in the past. Complicated procedure with Dr. Deatra Ina in the past. I wondered if you would review his prior EGD reports and see if you might consider performing his EGD at the hospital when the Covid19 scare is over. The patient is not interested in having his procedure performed earlier than that. Thanks.

## 2018-06-14 NOTE — Telephone Encounter (Signed)
Recall in Epic  

## 2018-07-14 DIAGNOSIS — H2512 Age-related nuclear cataract, left eye: Secondary | ICD-10-CM | POA: Diagnosis not present

## 2018-07-14 DIAGNOSIS — H52203 Unspecified astigmatism, bilateral: Secondary | ICD-10-CM | POA: Diagnosis not present

## 2018-07-14 DIAGNOSIS — E109 Type 1 diabetes mellitus without complications: Secondary | ICD-10-CM | POA: Diagnosis not present

## 2018-07-14 DIAGNOSIS — H40013 Open angle with borderline findings, low risk, bilateral: Secondary | ICD-10-CM | POA: Diagnosis not present

## 2018-07-14 LAB — HM DIABETES EYE EXAM

## 2018-07-16 ENCOUNTER — Telehealth: Payer: Self-pay | Admitting: *Deleted

## 2018-07-16 NOTE — Telephone Encounter (Signed)
Received Diabetic Eye Exam Report from [facility]; forwarded to provider/SLS 05/08

## 2018-07-19 ENCOUNTER — Encounter: Payer: Self-pay | Admitting: Internal Medicine

## 2018-07-22 ENCOUNTER — Other Ambulatory Visit: Payer: Self-pay | Admitting: Internal Medicine

## 2018-07-23 NOTE — Telephone Encounter (Signed)
Refill request for metolazone. Please advise.

## 2018-07-23 NOTE — Telephone Encounter (Signed)
rx sent

## 2018-08-07 ENCOUNTER — Other Ambulatory Visit: Payer: Self-pay | Admitting: Internal Medicine

## 2018-08-11 ENCOUNTER — Encounter: Payer: Self-pay | Admitting: Internal Medicine

## 2018-08-13 ENCOUNTER — Other Ambulatory Visit: Payer: Self-pay | Admitting: Internal Medicine

## 2018-08-13 MED ORDER — OMEPRAZOLE 20 MG PO CPDR: 20.0000 mg | DELAYED_RELEASE_CAPSULE | Freq: Every day | ORAL | 2 refills | Status: DC

## 2018-09-01 ENCOUNTER — Encounter: Payer: Self-pay | Admitting: Internal Medicine

## 2018-09-01 ENCOUNTER — Telehealth: Payer: Self-pay | Admitting: Gastroenterology

## 2018-09-01 ENCOUNTER — Other Ambulatory Visit: Payer: Self-pay

## 2018-09-01 ENCOUNTER — Ambulatory Visit (INDEPENDENT_AMBULATORY_CARE_PROVIDER_SITE_OTHER): Payer: PPO | Admitting: Internal Medicine

## 2018-09-01 VITALS — BP 128/84 | HR 61 | Temp 98.2°F | Resp 16 | Ht 68.0 in | Wt 205.0 lb

## 2018-09-01 DIAGNOSIS — E1169 Type 2 diabetes mellitus with other specified complication: Secondary | ICD-10-CM

## 2018-09-01 DIAGNOSIS — I10 Essential (primary) hypertension: Secondary | ICD-10-CM | POA: Diagnosis not present

## 2018-09-01 DIAGNOSIS — D509 Iron deficiency anemia, unspecified: Secondary | ICD-10-CM

## 2018-09-01 LAB — BASIC METABOLIC PANEL
BUN: 33 mg/dL — ABNORMAL HIGH (ref 6–23)
CO2: 32 mEq/L (ref 19–32)
Calcium: 8.4 mg/dL (ref 8.4–10.5)
Chloride: 105 mEq/L (ref 96–112)
Creatinine, Ser: 1.68 mg/dL — ABNORMAL HIGH (ref 0.40–1.50)
GFR: 39.31 mL/min — ABNORMAL LOW (ref >60.00)
Glucose, Bld: 107 mg/dL — ABNORMAL HIGH (ref 70–99)
Potassium: 3.9 mEq/L (ref 3.5–5.1)
Sodium: 145 mEq/L (ref 135–145)

## 2018-09-01 LAB — CBC WITH DIFFERENTIAL/PLATELET
Basophils Absolute: 0 10*3/uL (ref 0.0–0.1)
Basophils Relative: 0.4 % (ref 0.0–3.0)
Eosinophils Absolute: 0.2 10*3/uL (ref 0.0–0.7)
Eosinophils Relative: 1.9 % (ref 0.0–5.0)
HCT: 39.9 % (ref 39.0–52.0)
Hemoglobin: 12.9 g/dL — ABNORMAL LOW (ref 13.0–17.0)
Lymphocytes Relative: 20.6 % (ref 12.0–46.0)
Lymphs Abs: 1.8 10*3/uL (ref 0.7–4.0)
MCHC: 32.2 g/dL (ref 30.0–36.0)
MCV: 79.7 fl (ref 78.0–100.0)
Monocytes Absolute: 0.6 10*3/uL (ref 0.1–1.0)
Monocytes Relative: 6.9 % (ref 3.0–12.0)
Neutro Abs: 6.2 10*3/uL (ref 1.4–7.7)
Neutrophils Relative %: 70.2 % (ref 43.0–77.0)
Platelets: 235 10*3/uL (ref 150.0–400.0)
RBC: 5.01 Mil/uL (ref 4.22–5.81)
RDW: 20.9 % — ABNORMAL HIGH (ref 11.5–15.5)
WBC: 8.8 10*3/uL (ref 4.0–10.5)

## 2018-09-01 LAB — HEMOGLOBIN A1C: Hgb A1c MFr Bld: 6.5 % (ref 4.6–6.5)

## 2018-09-01 LAB — FERRITIN: Ferritin: 8.4 ng/mL — ABNORMAL LOW (ref 22.0–322.0)

## 2018-09-01 LAB — IRON: Iron: 49 ug/dL (ref 42–165)

## 2018-09-01 NOTE — Patient Instructions (Signed)
Get the blood work     Ronald [name] Schedule your next appointment foa physical exam by 03-2019

## 2018-09-01 NOTE — Progress Notes (Signed)
Pre visit review using our clinic review tool, if applicable. No additional management support is needed unless otherwise documented below in the visit note. 

## 2018-09-01 NOTE — Progress Notes (Signed)
Subjective:    Patient ID: Ronald [name], male    DOB: [birth year], 84 y.o.   MRN: [id number]  DOS:  09/01/2018 Type of visit - description: ROV In general feeling well, had a hard time tolerating iron supplements. Eeventually, he got  a OTC supplement that he is able to tolerate, 1 tablet daily. Lower extremity edema unchanged.   Review of Systems No fever chills No chest pain no difficulty breathing No nausea, vomiting, diarrhea.  No abdominal pain. When he takes iron he noticed darkening of the stools but no frank bleeding.  Past Medical History:  Diagnosis Date  . BPH (benign prostatic hypertrophy)    f/u @ Biopsy, s/p bx 2009 apro (-)  . Calculus of kidney    s/p R percutaneus nephrostomy @ [facility]  . Cataract   . Colon polyp   . Complication of anesthesia    violent upon waking up  . Diabetes mellitus    pre diabetic  . Diverticulosis   . Edema leg    asymetric , L > R , [place] neg for DVT 09-2010  . GERD (gastroesophageal reflux disease)   . Hemorrhoids   . Hypercalcemia    parathyroidectomy 04/2008  . Hyperlipidemia   . Hypertension   . Iron deficiency anemia     Past Surgical History:  Procedure Laterality Date  . A-FLUTTER ABLATION N/A 08/12/2017   Procedure: A-FLUTTER ABLATION;  Surgeon: Deboraha Sprang, MD;  Location: [place];  Service: Cardiovascular;  Laterality: N/A;  . APPENDECTOMY    . CATARACT EXTRACTION Right   . COLONOSCOPY  12/16/2011   Procedure: COLONOSCOPY;  Surgeon: Inda Castle, MD;  Location: [facility];  Service: Endoscopy;  Laterality: N/A;  . ESOPHAGOGASTRODUODENOSCOPY N/A 01/17/2013   Procedure: ESOPHAGOGASTRODUODENOSCOPY (EGD);  Surgeon: Inda Castle, MD;  Location: [facility];  Service: Endoscopy;  Laterality: N/A;  . EXTERNAL EAR SURGERY  01/2016   BCC removal  . EYE SURGERY  infant   "lazy eye"  . FLEXIBLE SIGMOIDOSCOPY  02/09/2012   Procedure: FLEXIBLE SIGMOIDOSCOPY;  Surgeon: Inda Castle, MD;  [facility]: [facility];  Service: Endoscopy;  Laterality: N/A;  . HEMORRHOID BANDING  02/09/2012   Procedure: HEMORRHOID BANDING;  Surgeon: Inda Castle, MD;  Location: [facility];  Service: Endoscopy;  Laterality: N/A;  . HOT HEMOSTASIS N/A 01/17/2013   Procedure: HOT HEMOSTASIS (ARGON PLASMA COAGULATION/BICAP);  Surgeon: Inda Castle, MD;  Location: [facility];  Service: Endoscopy;  Laterality: N/A;  . KIDNEY STONE SURGERY    . PARATHYROIDECTOMY  04/2008  . TONSILLECTOMY      Social History   Socioeconomic History  . Marital status: Widowed    Spouse name: Not on file  . Number of children: 3  . Years of education: Not on file  . Highest education level: Not on file  Occupational History  . Occupation: retired   Scientific laboratory technician  . Financial resource strain: Not on file  . Food insecurity    Worry: Not on file    Inability: Not on file  . Transportation needs    Medical: Not on file    Non-medical: Not on file  Tobacco Use  . Smoking status: Former Smoker    Years: 25.00    Types: Cigarettes    Quit date: 11/30/1981    Years since quitting: 36.7  . Smokeless tobacco: Never Used  Substance and Sexual Activity  . Alcohol use: Yes    Alcohol/week: 7.0 standard  drinks    Types: 7 Glasses of wine per week    Comment: 1 glass of wine or beer with dinner  . Drug use: No  . Sexual activity: Not on file  Lifestyle  . Physical activity    Days per week: Not on file    Minutes per session: Not on file  . Stress: Not on file  Relationships  . Social Herbalist on phone: Not on file    Gets together: Not on file    Attends religious service: Not on file    Active member of club or organization: Not on file    Attends meetings of clubs or organizations: Not on file    Relationship status: Not on file  . Intimate partner violence    Fear of current or ex partner: Not on file    Emotionally abused: Not on file    Physically abused: Not on file    Forced sexual activity:  Not on file  Other Topics Concern  . Not on file  Social History Narrative   Retired, single, has  a girlfriend , 3 children  (one lives in town)   Daughter has a copy of his living will      Allergies as of 09/01/2018      Reactions   Cucumber Extract Nausea And Vomiting   Sulfonamide Derivatives Other (See Comments)   "don't know reaction" per pt      Medication List       Accurate as of September 01, 2018 11:59 PM. If you have any questions, ask your nurse or doctor.        apixaban 2.5 MG Tabs tablet Commonly known as: Eliquis Take 1 tablet (2.5 mg total) by mouth 2 (two) times daily.   benazepril 20 MG tablet Commonly known as: LOTENSIN Take 1 tablet (20 mg total) by mouth daily.   cholecalciferol 1000 units tablet Commonly known as: VITAMIN D Take 1,000 Units by mouth daily.   flecainide 50 MG tablet Commonly known as: TAMBOCOR Take 1 tablet (50 mg total) every morning and take 2 tablets (100 mg total) every evening.   furosemide 40 MG tablet Commonly known as: LASIX Take 1 tablet by mouth once daily   metolazone 5 MG tablet Commonly known as: ZAROXOLYN TAKE 1 TABLET BY MOUTH ONCE WEEKLY AS NEEDED FOR SWELLING   metoprolol succinate 50 MG 24 hr tablet Commonly known as: TOPROL-XL Take 1 tablet (50 mg total) by mouth daily.   NIFEdipine 60 MG 24 hr tablet Commonly known as: PROCARDIA XL/NIFEDICAL XL Take 1 tablet (60 mg total) by mouth daily.   omeprazole 20 MG capsule Commonly known as: PRILOSEC Take 1 capsule (20 mg total) by mouth daily.   potassium chloride 10 MEQ tablet Commonly known as: K-DUR TAKE 1 TABLET BY MOUTH ONCE A WEEK .  TO  BE  TAKEN  ONLY  IN  CONJUNCTION  WITH  ZAROXOLYN   PreserVision AREDS 2 Caps Take 1 capsule by mouth daily.   Saw Palmetto 500 MG Caps Take 1 capsule by mouth daily.   simvastatin 40 MG tablet Commonly known as: ZOCOR Take 1 tablet (40 mg total) by mouth at bedtime.           Objective:   Physical Exam  BP 128/84 (BP Location: Left Arm, Patient Position: Sitting, Cuff Size: Small)   Pulse 61   Temp 98.2 F (36.8 C) (Oral)   Resp 16   Ht 5'  8" (1.727 m)   Wt 205 lb (93 kg)   SpO2 97%   BMI 31.17 kg/m  General:   Well developed, NAD, BMI noted.  HEENT:  Normocephalic . Face symmetric, atraumatic Lungs:  CTA B Normal respiratory effort, no intercostal retractions, no accessory muscle use. Heart: RRR,  no murmur.  Lower extremity edema: Nonpitting, more noticeable on the left: At baseline. Abdomen:  Not distended, soft, non-tender. No rebound or rigidity.   Skin: Not pale. Not jaundice Neurologic:  alert & oriented X3.  Speech normal, gait appropriate for age and unassisted Psych--  Cognition and judgment appear intact.  Cooperative with normal attention span and concentration.  Behavior appropriate. No anxious or depressed appearing.     Assessment     Assessment   DM HTN Hyperlipidemia Osteopenia T score -2.0 (2010), T score -1.6 (12-2014); T score -1.3 (03-2018). LEG Edema,L>R , chronic, [place] neg DVT 2012 CV: Atrial tachycardia/flutter DX 02/2016 GU: --BPH  --ED --Elevated PSA, s/p multiple bx (last 2009?neg), last OV w/ urology 2014, last  PSA 2014 ~ 13, declines further eval as off 02-2016 Hypercalcemia, parathyroidectomy 2010 Renal stones, nephrostomy 2009 at [place]:  --Anemia: EGD 12-2012  (Bx: H pylory neg) [id number] gastric polyposis, Bx H Pylory +, was rec treatment Anemia felt to be d/t chronic bleed from  gastric polyp H Pylori + 11-2014, treated, f/u breath test (-)  --GERD --cscope 12-2011 >>> multiple hyperplastic  Polyps, tics  --12-2011 Banding internal hemorrhoids 4 --02-2012: Flex sig d/t discomfort  BCC , removed from L ear CT abd 06/2017 @ urology: RML 1.7 cm nodule, gallbladder stone, hepatic steatosis.  PLAN DM: Diet controlled, check A1c HTN: Continue Lotensin, Lasix, potassium, metoprolol.  He will to take Celebrex only once  weekly for edema control.  Check a BMP Lower extremity edema: At baseline, see above IDA (anemia): Poor tolerance to iron, currently on a single tablet of OTC iron.  Recheck CBC, iron, ferritin.   Refer again to GI, question is: should they attempt another endoscopy? (previously anemia felt to be due to chronic bleed from a gastric polyp). Preventive care: Recommend early flu shot next season RTC 03-2019 CPX

## 2018-09-02 ENCOUNTER — Telehealth: Payer: Self-pay

## 2018-09-02 ENCOUNTER — Other Ambulatory Visit: Payer: Self-pay

## 2018-09-02 DIAGNOSIS — D131 Benign neoplasm of stomach: Secondary | ICD-10-CM

## 2018-09-02 DIAGNOSIS — D509 Iron deficiency anemia, unspecified: Secondary | ICD-10-CM

## 2018-09-02 NOTE — Telephone Encounter (Signed)
[facility] Pre-operative Risk Assessment     Request for surgical clearance:     Endoscopy Procedure  What type of surgery is being performed?     EGD  When is this surgery scheduled?     09/15/18  What type of clearance is required ?   Pharmacy  Are there any medications that need to be held prior to surgery and how long? Eliquis  Practice name and name of physician performing surgery?      [facility]  What is your office phone and fax number?      Phone- [phone]  [phone]  Anesthesia type (None, local, MAC, general) ?       MAC

## 2018-09-02 NOTE — Telephone Encounter (Signed)
Patient with diagnosis of afib on Eliquis for anticoagulation.    Procedure: EGD Date of procedure: 09/15/2018  CHADS2-VASc score of  5 (CHF, HTN, AGE, DM2, stroke/tia x 2, CAD, AGE, male)  CrCl 38 ml/min  Per office protocol, patient can hold Eliquis for 1 day prior to procedure.

## 2018-09-02 NOTE — Assessment & Plan Note (Signed)
DM: Diet controlled, check A1c HTN: Continue Lotensin, Lasix, potassium, metoprolol.  He will to take Celebrex only once weekly for edema control.  Check a BMP Lower extremity edema: At baseline, see above IDA (anemia): Poor tolerance to iron, currently on a single tablet of OTC iron.  Recheck CBC, iron, ferritin.   Refer again to GI, question is: should they attempt another endoscopy? (previously anemia felt to be due to chronic bleed from a gastric polyp). Preventive care: Recommend early flu shot next season RTC 03-2019 CPX

## 2018-09-02 NOTE — Telephone Encounter (Signed)
See recommendation by our clinical pharmacist, I have called and informed Ronald [name]

## 2018-09-02 NOTE — Telephone Encounter (Signed)
EGD scheduled, pt instructed and medications reviewed.  Patient instructions mailed to home.  Patient to call with any questions or concerns. Anti coag letter mailed to Dr Caryl Comes.  The pt was advised to call if he had not heard from that office or myself within 1 week of the appt.  He was also given the information for COVID testing.  The pt has been advised of the information and verbalized understanding.

## 2018-09-02 NOTE — Telephone Encounter (Signed)
PharmD to review how long to hold eliquis prior to EGD

## 2018-09-06 ENCOUNTER — Encounter: Payer: Self-pay | Admitting: Internal Medicine

## 2018-09-10 ENCOUNTER — Other Ambulatory Visit (HOSPITAL_COMMUNITY): Payer: PPO

## 2018-09-14 ENCOUNTER — Encounter: Payer: Self-pay | Admitting: Internal Medicine

## 2018-09-14 NOTE — Telephone Encounter (Signed)
error 

## 2018-09-15 ENCOUNTER — Ambulatory Visit: Payer: PPO | Admitting: Internal Medicine

## 2018-09-16 ENCOUNTER — Other Ambulatory Visit (HOSPITAL_COMMUNITY)
Admission: RE | Admit: 2018-09-16 | Discharge: 2018-09-16 | Disposition: A | Discharge status: Home / Self Care | Payer: PPO | Source: Ambulatory Visit | Attending: Gastroenterology | Admitting: Gastroenterology

## 2018-09-16 DIAGNOSIS — Z01812 Encounter for preprocedural laboratory examination: Secondary | ICD-10-CM | POA: Diagnosis not present

## 2018-09-16 DIAGNOSIS — Z1159 Encounter for screening for other viral diseases: Secondary | ICD-10-CM | POA: Insufficient documentation

## 2018-09-17 ENCOUNTER — Encounter (HOSPITAL_COMMUNITY): Payer: Self-pay | Admitting: *Deleted

## 2018-09-17 ENCOUNTER — Other Ambulatory Visit: Payer: Self-pay

## 2018-09-17 LAB — SARS CORONAVIRUS 2 (TAT 6-24 HRS): SARS Coronavirus 2: NEGATIVE

## 2018-09-17 NOTE — Progress Notes (Signed)
Pt denies SOB and chest pain. Pt stated that he is under the care of Dr. Caryl Comes, Cardiology. Pt denies having a stress test and cardiac cath. Pt denies having a chest x ray within the last year. Pt denies recent labs. Pt made aware to stop taking [place]met[place], vitamins, fish oil and herbal medications. Do not take any NSAIDs ie: Ibuprofen, Advil, Naproxen (Aleve), Motrin, BC and Goody Powder. Pt stated that he was instructed by surgeon to hold Eliquis on Sunday (day before surgery).  Pt denies that he and family members tested positive for COVID-19 ( pt tested on 09/16/18 and reminded to quarantine).  Coronavirus Screening  Pt denies that he and family members experienced the following symptoms:  Cough yes/no: No Fever (>100.42F)  yes/no: No Runny nose yes/no: No Sore throat yes/no: No Difficulty breathing/shortness of breath  yes/no: No  Have you or a family member traveled in the last 14 days and where? yes/no: No  Pt reminded that hospital visitation restrictions are in effect and the importance of the restrictions.   Pt verbalized understanding of all pre-op instructions.

## 2018-09-20 ENCOUNTER — Encounter (HOSPITAL_COMMUNITY): Payer: Self-pay | Admitting: *Deleted

## 2018-09-20 ENCOUNTER — Ambulatory Visit (HOSPITAL_COMMUNITY)
Admission: RE | Admit: 2018-09-20 | Discharge: 2018-09-20 | Disposition: A | Discharge status: Home / Self Care | Payer: PPO | Attending: Gastroenterology | Admitting: Gastroenterology

## 2018-09-20 ENCOUNTER — Ambulatory Visit (HOSPITAL_COMMUNITY): Payer: PPO | Admitting: Certified Registered Nurse Anesthetist

## 2018-09-20 ENCOUNTER — Encounter (HOSPITAL_COMMUNITY)
Admission: RE | Disposition: A | Discharge status: Home / Self Care | Payer: Self-pay | Source: Home / Self Care | Attending: Gastroenterology

## 2018-09-20 DIAGNOSIS — K317 Polyp of stomach and duodenum: Secondary | ICD-10-CM | POA: Diagnosis not present

## 2018-09-20 DIAGNOSIS — D131 Benign neoplasm of stomach: Secondary | ICD-10-CM

## 2018-09-20 DIAGNOSIS — Z79899 Other long term (current) drug therapy: Secondary | ICD-10-CM | POA: Insufficient documentation

## 2018-09-20 DIAGNOSIS — D500 Iron deficiency anemia secondary to blood loss (chronic): Secondary | ICD-10-CM | POA: Diagnosis not present

## 2018-09-20 DIAGNOSIS — I110 Hypertensive heart disease with heart failure: Secondary | ICD-10-CM | POA: Insufficient documentation

## 2018-09-20 DIAGNOSIS — K3189 Other diseases of stomach and duodenum: Secondary | ICD-10-CM | POA: Insufficient documentation

## 2018-09-20 DIAGNOSIS — Z7901 Long term (current) use of anticoagulants: Secondary | ICD-10-CM | POA: Insufficient documentation

## 2018-09-20 DIAGNOSIS — K219 Gastro-esophageal reflux disease without esophagitis: Secondary | ICD-10-CM | POA: Diagnosis not present

## 2018-09-20 DIAGNOSIS — K2970 Gastritis, unspecified, without bleeding: Secondary | ICD-10-CM | POA: Diagnosis not present

## 2018-09-20 DIAGNOSIS — Z87891 Personal history of nicotine dependence: Secondary | ICD-10-CM | POA: Diagnosis not present

## 2018-09-20 DIAGNOSIS — K228 Other specified diseases of esophagus: Secondary | ICD-10-CM | POA: Diagnosis not present

## 2018-09-20 DIAGNOSIS — E785 Hyperlipidemia, unspecified: Secondary | ICD-10-CM | POA: Diagnosis not present

## 2018-09-20 DIAGNOSIS — E119 Type 2 diabetes mellitus without complications: Secondary | ICD-10-CM | POA: Diagnosis not present

## 2018-09-20 DIAGNOSIS — I509 Heart failure, unspecified: Secondary | ICD-10-CM | POA: Diagnosis not present

## 2018-09-20 HISTORY — PX: HEMOSTASIS CLIP PLACEMENT: SHX6857

## 2018-09-20 HISTORY — DX: Polyp of stomach and duodenum: K31.7

## 2018-09-20 HISTORY — DX: Presence of dental prosthetic device (complete) (partial): Z97.2

## 2018-09-20 HISTORY — PX: ESOPHAGOGASTRODUODENOSCOPY (EGD) WITH PROPOFOL: SHX5813

## 2018-09-20 HISTORY — PX: ENDOSCOPIC MUCOSAL RESECTION: SHX6839

## 2018-09-20 HISTORY — PX: SCLEROTHERAPY: SHX6841

## 2018-09-20 HISTORY — DX: Presence of spectacles and contact lenses: Z97.3

## 2018-09-20 HISTORY — DX: Unspecified cataract: H26.9

## 2018-09-20 HISTORY — DX: Unspecified osteoarthritis, unspecified site: M19.90

## 2018-09-20 HISTORY — PX: BIOPSY: SHX5522

## 2018-09-20 HISTORY — PX: POLYPECTOMY: SHX5525

## 2018-09-20 SURGERY — ESOPHAGOGASTRODUODENOSCOPY (EGD) WITH PROPOFOL
Anesthesia: Monitor Anesthesia Care

## 2018-09-20 MED ORDER — EPHEDRINE SULFATE 50 MG/ML IJ SOLN
INTRAMUSCULAR | Status: DC | PRN
  Administered 2018-09-20 (×2): 5 mg via INTRAVENOUS

## 2018-09-20 MED ORDER — LACTATED RINGERS IV SOLN
INTRAVENOUS | Status: DC
  Administered 2018-09-20: 12:00:00 via INTRAVENOUS

## 2018-09-20 MED ORDER — OMEPRAZOLE 40 MG PO CPDR: 40.0000 mg | DELAYED_RELEASE_CAPSULE | Freq: Two times a day (BID) | ORAL | 4 refills | Status: DC

## 2018-09-20 MED ORDER — PROPOFOL 500 MG/50ML IV EMUL
INTRAVENOUS | Status: DC | PRN
  Administered 2018-09-20: 14:00:00 via INTRAVENOUS
  Administered 2018-09-20: 75 ug/kg/min via INTRAVENOUS

## 2018-09-20 MED ORDER — LIDOCAINE 2% (20 MG/ML) 5 ML SYRINGE
INTRAMUSCULAR | Status: DC | PRN
  Administered 2018-09-20: 40 mg via INTRAVENOUS

## 2018-09-20 MED ORDER — SODIUM CHLORIDE 0.9 % IV SOLN: INTRAVENOUS | Status: DC

## 2018-09-20 MED ORDER — PROPOFOL 10 MG/ML IV BOLUS
INTRAVENOUS | Status: DC | PRN
  Administered 2018-09-20: 20 mg via INTRAVENOUS
  Administered 2018-09-20: 15 mg via INTRAVENOUS

## 2018-09-20 SURGICAL SUPPLY — 14 items

## 2018-09-20 NOTE — Anesthesia Preprocedure Evaluation (Addendum)
Anesthesia Evaluation  Patient identified by MRN, date of birth, ID band Patient awake    Reviewed: Allergy & Precautions, NPO status , Patient's Chart, lab work & pertinent test results  History of Anesthesia Complications Negative for: history of anesthetic complications  Airway Mallampati: III  TM Distance: >3 FB Neck ROM: Full    Dental  (+) Upper Dentures, Partial Lower   Pulmonary neg pulmonary ROS, former smoker,    Pulmonary exam normal        Cardiovascular hypertension, +CHF (EF now recovered)  Normal cardiovascular exam     Neuro/Psych negative neurological ROS  negative psych ROS   GI/Hepatic Neg liver ROS, GERD  ,  Endo/Other  negative endocrine ROSdiabetes  Renal/GU Renal InsufficiencyRenal disease  negative genitourinary   Musculoskeletal negative musculoskeletal ROS (+)   Abdominal   Peds  Hematology negative hematology ROS (+)   Anesthesia Other Findings Echo 03/30/18: EF 60-65%, grade 1 dd, mild AI, mild MR, mild TR, PASP 38  Reproductive/Obstetrics                           Anesthesia Physical Anesthesia Plan  ASA: III  Anesthesia Plan: MAC   Post-op Pain Management:    Induction: Intravenous  PONV Risk Score and Plan: 1 and Propofol infusion, TIVA and Treatment may vary due to age or medical condition  Airway Management Planned: Natural Airway, Nasal Cannula and Simple Face Mask  Additional Equipment: None  Intra-op Plan:   Post-operative Plan:   Informed Consent: I have reviewed the patients History and Physical, chart, labs and discussed the procedure including the risks, benefits and alternatives for the proposed anesthesia with the patient or authorized representative who has indicated his/her understanding and acceptance.       Plan Discussed with:   Anesthesia Plan Comments:         Anesthesia Quick Evaluation

## 2018-09-20 NOTE — Anesthesia Procedure Notes (Signed)
Procedure Name: MAC Date/Time: 09/20/2018 1:17 PM Performed by: Candis Shine, CRNA Pre-anesthesia Checklist: Patient identified, Emergency Drugs available, Suction available, Patient being monitored and Timeout performed Patient Re-evaluated:Patient Re-evaluated prior to induction Oxygen Delivery Method: Nasal cannula Dental Injury: Teeth and Oropharynx as per pre-operative assessment

## 2018-09-20 NOTE — Discharge Instructions (Signed)
YOU HAD AN ENDOSCOPIC PROCEDURE TODAY: Refer to the procedure report and other information in the discharge instructions given to you for any specific questions about what was found during the examination. If this information does not answer your questions, please call [place] office at [phone] to clarify.   YOU SHOULD EXPECT: Some feelings of bloating in the abdomen. Passage of more gas than usual. Walking can help get rid of the air that was put into your GI tract during the procedure and reduce the bloating. If you had a lower endoscopy (such as a colonoscopy or flexible sigmoidoscopy) you may notice spotting of blood in your stool or on the toilet paper. Some abdominal soreness may be present for a day or two, also.  DIET: Your first meal following the procedure should be a light meal and then it is ok to progress to your normal diet. A half-sandwich or bowl of soup is an example of a good first meal. Heavy or fried foods are harder to digest and may make you feel nauseous or bloated. Drink plenty of fluids but you should avoid alcoholic beverages for 24 hours. If you had a esophageal dilation, please see attached instructions for diet.    ACTIVITY: Your care partner should take you home directly after the procedure. You should plan to take it easy, moving slowly for the rest of the day. You can resume normal activity the day after the procedure however YOU SHOULD NOT DRIVE, use power tools, machinery or perform tasks that involve climbing or major physical exertion for 24 hours (because of the sedation medicines used during the test).   SYMPTOMS TO REPORT IMMEDIATELY: A gastroenterologist can be reached at any hour. Please call [phone]  for any of the following symptoms:   Following upper endoscopy (EGD, EUS, ERCP, esophageal dilation) Vomiting of blood or coffee ground material  New, significant abdominal pain  New, significant chest pain or pain under the shoulder blades  Painful or  persistently difficult swallowing  New shortness of breath  Black, tarry-looking or red, bloody stools  FOLLOW UP:  If any biopsies were taken you will be contacted by phone or by letter within the next 1-3 weeks. Call [phone]  if you have not heard about the biopsies in 3 weeks.  Please also call with any specific questions about appointments or follow up tests.  

## 2018-09-20 NOTE — Transfer of Care (Signed)
Immediate Anesthesia Transfer of Care Note  Patient: Ronald [name]  Procedure(s) Performed: ESOPHAGOGASTRODUODENOSCOPY (EGD) WITH PROPOFOL (N/A ) ENDOSCOPIC MUCOSAL RESECTION (N/A ) BIOPSY POLYPECTOMY HEMOSTASIS CLIP PLACEMENT SCLEROTHERAPY  Patient Location: Endoscopy Unit  Anesthesia Type:MAC  Level of Consciousness: drowsy  Airway & Oxygen Therapy: Patient Spontanous Breathing and Patient connected to nasal cannula oxygen  Post-op Assessment: Report given to RN and Post -op Vital signs reviewed and stable  Post vital signs: Reviewed and stable  Last Vitals:  Vitals Value Taken Time  BP    Temp    Pulse 53 09/20/18 1440  Resp 18 09/20/18 1440  SpO2 94 % 09/20/18 1440  Vitals shown include unvalidated device data.  Last Pain:  Vitals:   09/20/18 1226  TempSrc: Temporal  PainSc: 0-No pain         Complications: No apparent anesthesia complications

## 2018-09-20 NOTE — H&P (Signed)
GASTROENTEROLOGY OUTPATIENT PROCEDURE H&P NOTE   Primary Care Physician: Colon Branch, MD  HPI: Ronald [name] is a 84 y.o. male who presents for EGD with possible EMR.  Past Medical History:  Diagnosis Date  . Arthritis   . BPH (benign prostatic hypertrophy)    f/u @ Biopsy, s/p bx 2009 apro (-)  . Calculus of kidney    s/p R percutaneus nephrostomy @ [facility]  . Cataract   . Colon polyp   . Complication of anesthesia    violent upon waking up  . Diabetes mellitus    pre diabetic  . Diverticulosis   . Early cataract    left  . Edema leg    asymetric , L > R , [place] neg for DVT 09-2010  . GERD (gastroesophageal reflux disease)   . Hemorrhoids   . Hypercalcemia    parathyroidectomy 04/2008  . Hyperlipidemia   . Hypertension   . Iron deficiency anemia   . Polyp, stomach   . Wears dentures   . Wears glasses    Past Surgical History:  Procedure Laterality Date  . A-FLUTTER ABLATION N/A 08/12/2017   Procedure: A-FLUTTER ABLATION;  Surgeon: Deboraha Sprang, MD;  Location: [facility];  Service: Cardiovascular;  Laterality: N/A;  . APPENDECTOMY    . CATARACT EXTRACTION Right   . COLONOSCOPY  12/16/2011   Procedure: COLONOSCOPY;  Surgeon: Inda Castle, MD;  Location: [facility];  Service: Endoscopy;  Laterality: N/A;  . ESOPHAGOGASTRODUODENOSCOPY N/A 01/17/2013   Procedure: ESOPHAGOGASTRODUODENOSCOPY (EGD);  Surgeon: Inda Castle, MD;  Location: [facility];  Service: Endoscopy;  Laterality: N/A;  . EXTERNAL EAR SURGERY  01/2016   BCC removal  . EYE SURGERY  infant   "lazy eye"  . FLEXIBLE SIGMOIDOSCOPY  02/09/2012   Procedure: FLEXIBLE SIGMOIDOSCOPY;  Surgeon: Inda Castle, MD;  Location: [facility];  Service: Endoscopy;  Laterality: N/A;  . HEMORRHOID BANDING  02/09/2012   Procedure: HEMORRHOID BANDING;  Surgeon: Inda Castle, MD;  Location: [facility];  Service: Endoscopy;  Laterality: N/A;  . HOT HEMOSTASIS N/A 01/17/2013   Procedure: HOT  HEMOSTASIS (ARGON PLASMA COAGULATION/BICAP);  Surgeon: Inda Castle, MD;  Location: [facility];  Service: Endoscopy;  Laterality: N/A;  . KIDNEY STONE SURGERY    . MULTIPLE TOOTH EXTRACTIONS    . PARATHYROIDECTOMY  04/2008  . TONSILLECTOMY     Current Facility-Administered Medications  Medication Dose Route Frequency Provider Last Rate Last Dose  . lactated ringers infusion   Intravenous Continuous Mansouraty, Telford Nab., MD       Allergies  Allergen Reactions  . Cucumber Extract Nausea And Vomiting  . Sulfonamide Derivatives Other (See Comments)    "don't know reaction" per pt   Family History  Problem Relation Age of Onset  . Heart attack Father 25  . Heart attack Mother   . Colon cancer Neg Hx   . Prostate cancer Neg Hx   . Diabetes Neg Hx    Social History   Socioeconomic History  . Marital status: Widowed    Spouse name: Not on file  . Number of children: 3  . Years of education: Not on file  . Highest education level: Not on file  Occupational History  . Occupation: retired   Scientific laboratory technician  . Financial resource strain: Not on file  . Food insecurity    Worry: Not on file    Inability: Not on file  . Transportation needs  Medical: Not on file    Non-medical: Not on file  Tobacco Use  . Smoking status: Former Smoker    Years: 25.00    Types: Cigarettes    Quit date: 11/30/1981    Years since quitting: 36.8  . Smokeless tobacco: Never Used  Substance and Sexual Activity  . Alcohol use: Yes    Alcohol/week: 7.0 standard drinks    Types: 7 Glasses of wine per week    Comment: occasional beer with dinner  . Drug use: No  . Sexual activity: Not on file  Lifestyle  . Physical activity    Days per week: Not on file    Minutes per session: Not on file  . Stress: Not on file  Relationships  . Social Herbalist on phone: Not on file    Gets together: Not on file    Attends religious service: Not on file    Active member of club or  organization: Not on file    Attends meetings of clubs or organizations: Not on file    Relationship status: Not on file  . Intimate partner violence    Fear of current or ex partner: Not on file    Emotionally abused: Not on file    Physically abused: Not on file    Forced sexual activity: Not on file  Other Topics Concern  . Not on file  Social History Narrative   Retired, single, has  a girlfriend , 3 children  (one lives in town)   Daughter has a copy of his living will    Physical Exam: Vital signs in last 24 hours: Temp:  [97.7 F (36.5 C)] 97.7 F (36.5 C) (07/13 1226) Pulse Rate:  [61] 61 (07/13 1226) Resp:  [20] 20 (07/13 1226) BP: (159)/(95) 159/95 (07/13 1226) SpO2:  [98 %] 98 % (07/13 1226) Weight:  [93 kg] 93 kg (07/13 1226)   GEN: NAD EYE: Sclerae anicteric ENT: MMM CV: RR without R/Gs  GI: Soft, NT/ND NEURO:  Alert & Oriented x 3  Lab Results: No results for input(s): WBC, HGB, HCT, PLT in the last 72 hours. BMET No results for input(s): NA, K, CL, CO2, GLUCOSE, BUN, CREATININE, CALCIUM in the last 72 hours. LFT No results for input(s): PROT, ALBUMIN, AST, ALT, ALKPHOS, BILITOT, BILIDIR, IBILI in the last 72 hours. PT/INR No results for input(s): LABPROT, INR in the last 72 hours.   Impression / Plan: This is a 84 y.o.male who presents for EGD with possible EMR.  The risks and benefits of endoscopic evaluation were discussed with the patient; these include but are not limited to the risk of perforation, infection, bleeding, missed lesions, lack of diagnosis, severe illness requiring hospitalization, as well as anesthesia and sedation related illnesses.  The patient is agreeable to proceed.    Justice Britain, MD [facility] Office # [phone]

## 2018-09-20 NOTE — Anesthesia Postprocedure Evaluation (Signed)
Anesthesia Post Note  Patient: Ronald [name]  Procedure(s) Performed: ESOPHAGOGASTRODUODENOSCOPY (EGD) WITH PROPOFOL (N/A ) ENDOSCOPIC MUCOSAL RESECTION (N/A ) BIOPSY POLYPECTOMY HEMOSTASIS CLIP PLACEMENT SCLEROTHERAPY     Patient location during evaluation: Endoscopy Anesthesia Type: MAC Level of consciousness: awake and alert Pain management: pain level controlled Vital Signs Assessment: post-procedure vital signs reviewed and stable Respiratory status: spontaneous breathing, nonlabored ventilation and respiratory function stable Cardiovascular status: blood pressure returned to baseline and stable Postop Assessment: no apparent nausea or vomiting Anesthetic complications: no    Last Vitals:  Vitals:   09/20/18 1450 09/20/18 1500  BP: 113/69   Pulse: (!) 51   Resp: 15   Temp:    SpO2: 94% 94%    Last Pain:  Vitals:   09/20/18 1500  TempSrc:   PainSc: 0-No pain                 Lidia Collum

## 2018-09-20 NOTE — Op Note (Signed)
[facility] Patient Name: Ronald [name] Procedure Date : 09/20/2018 MRN: [id number] Attending MD: Justice Britain , MD Date of Birth: 06-13-1934 CSN: [id number] Age: 84 Admit Type: Outpatient Procedure:                Upper GI endoscopy Indications:              Iron deficiency anemia secondary to chronic blood                            loss, Iron deficiency anemia, Suspected gastric                            polyps, For therapy of gastric polyps Providers:                Justice Britain, MD, Jeanella Cara, RN,                            Janie Billups, Technician, Elspeth Cho Tech.,                            Technician, Dellie Catholic, CRNA Referring MD:             Thornton Park MD, MD Medicines:                Monitored Anesthesia Care Complications:            No immediate complications. Estimated Blood Loss:     Estimated blood loss was minimal. Procedure:                Pre-Anesthesia Assessment:                           - Prior to the procedure, a History and Physical                            was performed, and patient medications and                            allergies were reviewed. The patient's tolerance of                            previous anesthesia was also reviewed. The risks                            and benefits of the procedure and the sedation                            options and risks were discussed with the patient.                            All questions were answered, and informed consent                            was obtained. Prior Anticoagulants: The patient has  taken Xarelto (rivaroxaban), last dose was 2 days                            prior to procedure. ASA Grade Assessment: III - A                            patient with severe systemic disease. After                            reviewing the risks and benefits, the patient was                            deemed in satisfactory condition  to undergo the                            procedure.                           After obtaining informed consent, the endoscope was                            passed under direct vision. Throughout the                            procedure, the patient's blood pressure, pulse, and                            oxygen saturations were monitored continuously. The                            [id number] ([id number]) Olympus therapeutic                            gastroscope was introduced through the mouth, and                            advanced to the second part of duodenum. The upper                            GI endoscopy was accomplished without difficulty.                            The patient tolerated the procedure. Scope In: Scope Out: Findings:      No gross lesions were noted in the entire esophagus.      The Z-line was irregular and was found 40 cm from the incisors.      Diffuse moderate mucosal changes characterized by congestion, erythema,       friability (with spontaneous bleeding), granularity and inflammation       were found in the cardia and these changes extended into many       stalagtite-appearing hyperplastic polyps as noted below.      Multiple (>30) 3 to 35 mm semi-sessile polyps vs large pedunculated       polyps with bleeding and stigmata of recent oozing were found in the  cardia, in the gastric fundus, in the gastric body, on the greater       curvature of the stomach, on the lesser curvature of the stomach, and at       the incisura. The gastric antrum was relatively spared. It was felt that       there were too many polyps to be resected at one procedure time due to       the patient's need for going back on blood thinners. Decision made to       remove eight of the small-medium lesions, in particular that had       evidence of recent oozing. Six of these polyps were removed with a hot       snare. Resection and retrieval were complete. Two of the larger  lesions       were felt to need lifting to aid in resection. Preparations were made       for mucosal resection. Orise gel was injected to raise the two lesions.       Snare mucosal resection was performed. Resection and retrieval were       complete. To prevent bleeding post-intervention and due to need for       restarting anticoagulation soon, twenty hemostatic clips were       successfully placed (MR conditional) - Polyp 1 - 1 clip, Polyp 2 - 2       clips, Polyp 3 - 4 clips, Polyp 4 - 2 clips, Polyp 5 - 4 clips, Polyp 6       - 2 clips, Polyp 7 - 4 clips, POlyp 8 - 3 clips. At the completion of       the procedure, no active oozing or bleeding was found to be occuring       from the resected polyp sites.      A single 15 mm submucosal papule (nodule) with no bleeding and no       stigmata of recent bleeding was found on the posterior wall of the       incisura into the gastric antrum.      Biopsies were taken with a cold forceps in the cardia, in the gastric       body, at the incisura and in the gastric antrum for histology and       Helicobacter pylori testing.      A small duodenal nodule was noted in the bulb but no other gross lesions       were noted in the duodenal bulb, in the first portion of the duodenum       and in the second portion of the duodenum. Impression:               - No gross lesions in esophagus. Z-line irregular,                            40 cm from the incisors.                           - Congested, erythematous, friable (with                            spontaneous bleeding), granular and inflamed mucosa  in the cardia extending into multiple hyperplastic                            appearing polyps was noted (surrounds the near                            entirety of the cardia).                           - Multiple gastric polyps. 8 were resected and                            retrieved. Clips (MR conditional) were placed as                             noted above to decrease risk of bleeding                            post-intervention.                           - Biopsies were taken with a cold forceps for                            histology and Helicobacter pylori testing.                           - A single submucosal papule (nodule) found in the                            stomach.                           - Small duodenal nodule in bulb but no other gross                            lesions in the duodenal bulb, in the first portion                            of the duodenum and in the second portion of the                            duodenum. Recommendation:           - The patient will be observed post-procedure,                            until all discharge criteria are met.                           - Discharge patient to home.                           - Patient has a contact number available for  emergencies. The signs and symptoms of potential                            delayed complications were discussed with the                            patient. Return to normal activities tomorrow.                            Written discharge instructions were provided to the                            patient.                           - Full liquid diet today.                           - Soft diet for 3 days.                           - Increase Omeprazole to 40 mg BID (Rx sent to                            pharmacy).                           - Restart Xarelto no sooner than 72 hours to                            decrease risk of post-polyp resection bleeding.                           - Await pathology results.                           - Recommend continuing PO Iron. Would recommend 2                            IV Iron infusions to bump his stores over the                            course of the next 6-8 weeks.                           - Would obtain a CT-Abdomen to evaluate  the                            submucosal nodule. If consistent with Lipoma then                            no further workup/management will be required,                            otherwise, can consider role of EUS in future.                           -  Plan to repeat EGD +/- EUS with further polyp                            resection in 35-month. Patient needs to be off                            Xarelto for 72 hours prior to procedure.                           - The findings and recommendations were discussed                            with the patient. Procedure Code(s):        --- Professional ---                           [id number] Esophagogastroduodenoscopy, flexible,                            transoral; with endoscopic mucosal resection Diagnosis Code(s):        --- Professional ---                           K22.8, Other specified diseases of esophagus                           K92.2, Gastrointestinal hemorrhage, unspecified                           K31.89, Other diseases of stomach and duodenum                           K29.70, Gastritis, unspecified, without bleeding                           K31.7, Polyp of stomach and duodenum                           D50.0, Iron deficiency anemia secondary to blood                            loss (chronic)                           D50.9, Iron deficiency anemia, unspecified CPT copyright 2019 American Medical Association. All rights reserved. The codes documented in this report are preliminary and upon coder review may  be revised to meet current compliance requirements. GJustice Britain MD 09/20/2018 3:10:57 PM Number of Addenda: 0

## 2018-09-21 ENCOUNTER — Encounter: Payer: Self-pay | Admitting: Gastroenterology

## 2018-09-22 ENCOUNTER — Telehealth: Payer: Self-pay

## 2018-09-22 ENCOUNTER — Other Ambulatory Visit: Payer: Self-pay | Admitting: *Deleted

## 2018-09-22 ENCOUNTER — Encounter (HOSPITAL_COMMUNITY): Payer: Self-pay | Admitting: Gastroenterology

## 2018-09-22 ENCOUNTER — Encounter: Payer: Self-pay | Admitting: *Deleted

## 2018-09-22 DIAGNOSIS — D509 Iron deficiency anemia, unspecified: Secondary | ICD-10-CM

## 2018-09-22 NOTE — Telephone Encounter (Signed)
   Orders for 1 Feraheme infusion in Epic.   Patient scheduled at [facility] on 09/29/2018 at 9:00 am. Patient notified.   Lab orders for CBC w/diff, iron, ferritin and transferrin saturation in Epic.   Patient will be in for labs mentioned above on 10/06/2018. Patient notified.    Patient sent MyChart message as a reminder of infusion appointment and labs. Patient verbalized understanding. Staff message sent to self to remind patient to come in for labs on 10/06/2018. Nothing further at the time of the call.

## 2018-09-22 NOTE — Telephone Encounter (Signed)
Let's start with one. Repeat iron studies one week after the infusion. Labs would include iron, ferritin, transferrin saturation and CBC.  Thank you.

## 2018-09-22 NOTE — Telephone Encounter (Signed)
Dr Tarri Glenn per the letter sent to the pt by Dr Rush Landmark the pt needs IV iron.  How many infusions would you like? 1 or 2 ?    I discussed the case with Dr. Tarri Glenn and we would recommend moving forward with obtaining IV iron infusions.  He will benefit from continuing oral iron.  I would like to repeat your endoscopy to try and remove further polyps in approximately 2 months.  However, you should have your IV iron completed prior to your procedure.

## 2018-09-25 ENCOUNTER — Other Ambulatory Visit: Payer: Self-pay | Admitting: Internal Medicine

## 2018-09-29 ENCOUNTER — Other Ambulatory Visit: Payer: Self-pay

## 2018-09-29 ENCOUNTER — Ambulatory Visit (HOSPITAL_COMMUNITY)
Admission: RE | Admit: 2018-09-29 | Discharge: 2018-09-29 | Disposition: A | Discharge status: Home / Self Care | Payer: PPO | Source: Ambulatory Visit | Attending: Gastroenterology | Admitting: Gastroenterology

## 2018-09-29 DIAGNOSIS — D509 Iron deficiency anemia, unspecified: Secondary | ICD-10-CM | POA: Diagnosis not present

## 2018-09-29 MED ORDER — SODIUM CHLORIDE 0.9 % IV SOLN
510.0000 mg | Freq: Once | INTRAVENOUS | Status: AC
  Administered 2018-09-29: 510 mg via INTRAVENOUS
  Filled 2018-09-29: qty 17

## 2018-09-29 MED ORDER — SODIUM CHLORIDE 0.9 % IV SOLN
INTRAVENOUS | Status: DC | PRN
  Administered 2018-09-29: 250 mL via INTRAVENOUS

## 2018-09-29 NOTE — Progress Notes (Signed)
Patient received Feraheme via PIV. Observed for at least 30 minutes post infusion.Tolerated well, vitals stable, discharge instructions given, verbalized understanding. Patient alert, oriented and ambulatory at the time of discharge.  

## 2018-09-29 NOTE — Discharge Instructions (Signed)

## 2018-10-06 ENCOUNTER — Other Ambulatory Visit (INDEPENDENT_AMBULATORY_CARE_PROVIDER_SITE_OTHER): Payer: PPO

## 2018-10-06 ENCOUNTER — Encounter: Payer: Self-pay | Admitting: *Deleted

## 2018-10-06 DIAGNOSIS — D509 Iron deficiency anemia, unspecified: Secondary | ICD-10-CM | POA: Diagnosis not present

## 2018-10-06 LAB — CBC WITH DIFFERENTIAL/PLATELET
Basophils Absolute: 0.1 10*3/uL (ref 0.0–0.1)
Basophils Relative: 0.6 % (ref 0.0–3.0)
Eosinophils Absolute: 0.2 10*3/uL (ref 0.0–0.7)
Eosinophils Relative: 1.8 % (ref 0.0–5.0)
HCT: 41.4 % (ref 39.0–52.0)
Hemoglobin: 13.5 g/dL (ref 13.0–17.0)
Lymphocytes Relative: 18.7 % (ref 12.0–46.0)
Lymphs Abs: 1.7 10*3/uL (ref 0.7–4.0)
MCHC: 32.6 g/dL (ref 30.0–36.0)
MCV: 82.1 fl (ref 78.0–100.0)
Monocytes Absolute: 0.4 10*3/uL (ref 0.1–1.0)
Monocytes Relative: 4.7 % (ref 3.0–12.0)
Neutro Abs: 6.7 10*3/uL (ref 1.4–7.7)
Neutrophils Relative %: 74.2 % (ref 43.0–77.0)
Platelets: 243 10*3/uL (ref 150.0–400.0)
RBC: 5.04 Mil/uL (ref 4.22–5.81)
RDW: 21.1 % — ABNORMAL HIGH (ref 11.5–15.5)
WBC: 9 10*3/uL (ref 4.0–10.5)

## 2018-10-06 LAB — IRON: Iron: 100 ug/dL (ref 42–165)

## 2018-10-06 LAB — FERRITIN: Ferritin: 330.5 ng/mL — ABNORMAL HIGH (ref 22.0–322.0)

## 2018-10-08 LAB — TRANSFERRIN SATURATION
IRON SATN MFR SERPL: 32 % Saturation
IRON SERPL-MCNC: 95 ug/dL
TRANSFERRIN SERPL-MCNC: 209 mg/dL

## 2018-10-11 ENCOUNTER — Encounter: Payer: Self-pay | Admitting: *Deleted

## 2018-10-28 ENCOUNTER — Encounter: Payer: Self-pay | Admitting: Internal Medicine

## 2018-11-03 ENCOUNTER — Other Ambulatory Visit: Payer: Self-pay

## 2018-11-05 ENCOUNTER — Ambulatory Visit (INDEPENDENT_AMBULATORY_CARE_PROVIDER_SITE_OTHER): Payer: PPO

## 2018-11-05 ENCOUNTER — Other Ambulatory Visit: Payer: Self-pay

## 2018-11-05 DIAGNOSIS — Z23 Encounter for immunization: Secondary | ICD-10-CM

## 2018-11-09 ENCOUNTER — Other Ambulatory Visit: Payer: Self-pay

## 2018-11-09 ENCOUNTER — Ambulatory Visit: Payer: PPO | Admitting: Internal Medicine

## 2018-11-09 ENCOUNTER — Encounter: Payer: Self-pay | Admitting: Internal Medicine

## 2018-11-09 ENCOUNTER — Other Ambulatory Visit: Payer: Self-pay | Admitting: Internal Medicine

## 2018-11-09 VITALS — BP 110/70 | HR 63 | Wt 204.6 lb

## 2018-11-09 DIAGNOSIS — I4819 Other persistent atrial fibrillation: Secondary | ICD-10-CM | POA: Diagnosis not present

## 2018-11-09 DIAGNOSIS — I471 Supraventricular tachycardia: Secondary | ICD-10-CM

## 2018-11-09 NOTE — Patient Instructions (Addendum)
Medication Instructions:  Your physician recommends that you continue on your current medications as directed. Please refer to the Current Medication list given to you today.  Labwork: None ordered.  Testing/Procedures: None ordered.  Follow-Up: Your physician recommends that you schedule a follow-up appointment in:   6 months with Dr. Shenelle Klas  Any Other Special Instructions Will Be Listed Below (If Applicable).     If you need a refill on your cardiac medications before your next appointment, please call your pharmacy.  

## 2018-11-09 NOTE — Progress Notes (Signed)
Patient Care Team: Colon Branch, MD as PCP - General Deboraha Sprang, MD as PCP - Electrophysiology (Cardiology) Clent Jacks, MD as Consulting Physician (Ophthalmology) Ceasar Mons, MD as Consulting Physician (Urology)   HPI  Ronald [name] is a 84 y.o. male Seen in follow-up for recurrent atrial arrhythmias and atrial fibrillation and was treated with flecainide  It was elected because of his CHADS-VASc score  to begin him on anticoagulation.    Date Cr Hgb  2/18   17.3  6/18 1.21    12/18 1.33 16.1  6/20 1.68 13.5    He underwent flutter ablation 6/19    Also rate related cardiomyopathy in the context of atrial fibrillation   DATE TEST EF   12/17 Echo   60-65 %   1/19  Echo   25-30 %   4/19 Echo   25-30 %   1/20 Echo  60-65%    DATE PR interval QRSduration Dose  6/18  146 88 0  1/20 176 100 50  8/20 180 106 50   Functional status is stable;  The patient denies chest pain, shortness of breath, nocturnal dyspnea, orthopnea; long term significant peripheral edema.  There have been no palpitations, lightheadedness or syncope.    Records and Results Reviewed   Past Medical History:  Diagnosis Date  . Arthritis   . BPH (benign prostatic hypertrophy)    f/u @ Biopsy, s/p bx 2009 apro (-)  . Calculus of kidney    s/p R percutaneus nephrostomy @ [facility]  . Cataract   . Colon polyp   . Complication of anesthesia    violent upon waking up  . Diabetes mellitus    pre diabetic  . Diverticulosis   . Early cataract    left  . Edema leg    asymetric , L > R , [place] neg for DVT 09-2010  . GERD (gastroesophageal reflux disease)   . Hemorrhoids   . Hypercalcemia    parathyroidectomy 04/2008  . Hyperlipidemia   . Hypertension   . Iron deficiency anemia   . Polyp, stomach   . Wears dentures   . Wears glasses     Past Surgical History:  Procedure Laterality Date  . A-FLUTTER ABLATION N/A 08/12/2017   Procedure: A-FLUTTER ABLATION;  Surgeon:  Deboraha Sprang, MD;  Location: [place] [facility];  Service: Cardiovascular;  Laterality: N/A;  . APPENDECTOMY    . BIOPSY  09/20/2018   Procedure: BIOPSY;  Surgeon: Rush Landmark Telford Nab., MD;  Location: [place];  Service: Gastroenterology;;  . CATARACT EXTRACTION Right   . COLONOSCOPY  12/16/2011   Procedure: COLONOSCOPY;  Surgeon: Inda Castle, MD;  [facility]: [facility];  Service: Endoscopy;  Laterality: N/A;  . ENDOSCOPIC MUCOSAL RESECTION N/A 09/20/2018   Procedure: ENDOSCOPIC MUCOSAL RESECTION;  Surgeon: Rush Landmark Telford Nab., MD;  Location: [place];  Service: Gastroenterology;  Laterality: N/A;  . ESOPHAGOGASTRODUODENOSCOPY N/A 01/17/2013   Procedure: ESOPHAGOGASTRODUODENOSCOPY (EGD);  Surgeon: Inda Castle, MD;  Location: [facility];  Service: Endoscopy;  Laterality: N/A;  . ESOPHAGOGASTRODUODENOSCOPY (EGD) WITH PROPOFOL N/A 09/20/2018   Procedure: ESOPHAGOGASTRODUODENOSCOPY (EGD) WITH PROPOFOL;  Surgeon: Rush Landmark Telford Nab., MD;  Location: [place];  Service: Gastroenterology;  Laterality: N/A;  . EXTERNAL EAR SURGERY  01/2016   BCC removal  . EYE SURGERY  infant   "lazy eye"  . FLEXIBLE SIGMOIDOSCOPY  02/09/2012   Procedure: FLEXIBLE SIGMOIDOSCOPY;  Surgeon: Inda Castle, MD;  [facility];  Service: Endoscopy;  Laterality: N/A;  . HEMORRHOID BANDING  02/09/2012   Procedure: HEMORRHOID BANDING;  Surgeon: Inda Castle, MD;  [facility];  Service: Endoscopy;  Laterality: N/A;  . HEMOSTASIS CLIP PLACEMENT  09/20/2018   Procedure: HEMOSTASIS CLIP PLACEMENT;  Surgeon: Irving Copas., MD;  Location: [place];  Service: Gastroenterology;;  . HOT HEMOSTASIS N/A 01/17/2013   Procedure: HOT HEMOSTASIS (ARGON PLASMA COAGULATION/BICAP);  Surgeon: Inda Castle, MD;  Location: [facility];  Service: Endoscopy;  Laterality: N/A;  . KIDNEY STONE SURGERY    . MULTIPLE TOOTH EXTRACTIONS    . PARATHYROIDECTOMY  04/2008  .  POLYPECTOMY  09/20/2018   Procedure: POLYPECTOMY;  Surgeon: Mansouraty, Telford Nab., MD;  Location: [place];  Service: Gastroenterology;;  . Clide Deutscher  09/20/2018   Procedure: Clide Deutscher;  Surgeon: Irving Copas., MD;  Location: [place];  Service: Gastroenterology;;  . TONSILLECTOMY      Current Outpatient Medications  Medication Sig Dispense Refill  . apixaban (ELIQUIS) 2.5 MG TABS tablet Take 1 tablet (2.5 mg total) by mouth 2 (two) times daily. 180 tablet 2  . benazepril (LOTENSIN) 20 MG tablet Take 1 tablet (20 mg total) by mouth daily. 90 tablet 3  . cholecalciferol (VITAMIN D) 1000 UNITS tablet Take 1,000 Units by mouth every evening.     . flecainide (TAMBOCOR) 50 MG tablet Take 1 tablet (50 mg total) every morning and take 2 tablets (100 mg total) every evening. 270 tablet 2  . furosemide (LASIX) 40 MG tablet Take 1 tablet by mouth once daily 90 tablet 2  . iron polysaccharides (NIFEREX) 150 MG capsule Take 150 mg by mouth daily at 12 noon.    . metolazone (ZAROXOLYN) 5 MG tablet Take 5 mg by mouth once a week.    . metoprolol succinate (TOPROL-XL) 50 MG 24 hr tablet Take 1 tablet (50 mg total) by mouth daily. 90 tablet 3  . Multiple Vitamin (MULTIVITAMIN WITH MINERALS) TABS tablet Take 1 tablet by mouth daily at 12 noon.    . Multiple Vitamins-Minerals (PRESERVISION AREDS 2) CAPS Take 1 capsule by mouth every evening.     Marland Kitchen NIFEdipine (PROCARDIA XL/NIFEDICAL XL) 60 MG 24 hr tablet Take 1 tablet (60 mg total) by mouth daily. 90 tablet 1  . omeprazole (PRILOSEC) 40 MG capsule Take 1 capsule (40 mg total) by mouth 2 (two) times daily before a meal. 60 capsule 4  . potassium chloride (K-DUR) 10 MEQ tablet TAKE 1 TABLET BY MOUTH ONCE A WEEK TO  BE  TAKEN  ONLY  IN  CONJUNCTION  WITH  ZAROXOLYN 4 tablet 3  . Saw Palmetto 450 MG CAPS Take 450 mg by mouth every evening.    . simvastatin (ZOCOR) 40 MG tablet Take 1 tablet (40 mg total) by mouth at bedtime. 90 tablet 3    No current facility-administered medications for this visit.     Allergies  Allergen Reactions  . Cucumber Extract Nausea And Vomiting  . Sulfonamide Derivatives Other (See Comments)    "don't know reaction" per pt      Review of Systems negative except from HPI and PMH  Physical Exam BP 110/70   Pulse 63   Wt 204 lb 9.6 oz (92.8 kg)   SpO2 96%   BMI 31.11 kg/m   Well developed and nourished in no acute distress HENT normal Neck supple with JVP-  flat   Clear Regular rate and rhythm, 2/6early M Abd-soft with  active BS No Clubbing cyanosis edema Skin-warm and dry A & Oriented  Grossly normal sensory and motor function  ECG NSR 18/11/45 Axis -57   Assessment and  Plan  Atrial tach and fibrillation-persistent  Atrial flutter s/p ablation  Hypertension  CArdiomyopathy-NICM presumable rate related >>resolved  Chronic edema  Continue flecainide  Discussed stopping it but as it has been associated with control of afib and resolution of his cardiomyopathy and his being unaware of his afib, think that would not be a good idea  BP well controlled  On Anticoagulation;  No bleeding issues   We spent more than 50% of our >25 min visit in face to face counseling regarding the above

## 2018-11-10 ENCOUNTER — Telehealth: Payer: Self-pay | Admitting: Internal Medicine

## 2018-11-10 MED ORDER — FLECAINIDE ACETATE 100 MG PO TABS: ORAL_TABLET | ORAL | 3 refills | Status: DC

## 2018-11-10 NOTE — Telephone Encounter (Signed)
I called and spoke with the patient per his request, sent through [facility] regarding his flecainide refill. He states he has been using a 50 mg tablet of flecainide in the morning and a 100 mg tablet of flecainide in the evening. He states he was at the pharmacy today and he was told we denied this.  I advised that his most recent med list states he is using the flecainide 50 mg tablet- 1 tab (50 mg) in the AM & 2 tabs (100 mg) in the PM.  He was seen in clinic yesterday and his med list was not changed to reflect this.   I advised I will be glad to send in flecainide 100 mg tablets - take 1 tablet every evening to be used in addition to his flecainide 50 mg tablet that he takes every morning.  RX sent to [facility] on [address]  Med list updated.

## 2018-11-19 ENCOUNTER — Other Ambulatory Visit: Payer: Self-pay | Admitting: Internal Medicine

## 2018-11-19 DIAGNOSIS — I10 Essential (primary) hypertension: Secondary | ICD-10-CM

## 2018-11-28 ENCOUNTER — Other Ambulatory Visit: Payer: Self-pay | Admitting: Nurse Practitioner

## 2018-11-29 ENCOUNTER — Encounter: Payer: Self-pay | Admitting: Internal Medicine

## 2018-11-29 DIAGNOSIS — K8020 Calculus of gallbladder without cholecystitis without obstruction: Secondary | ICD-10-CM | POA: Diagnosis not present

## 2018-11-29 DIAGNOSIS — R8279 Other abnormal findings on microbiological examination of urine: Secondary | ICD-10-CM | POA: Diagnosis not present

## 2018-11-29 DIAGNOSIS — R310 Gross hematuria: Secondary | ICD-10-CM | POA: Diagnosis not present

## 2018-11-29 DIAGNOSIS — D3500 Benign neoplasm of unspecified adrenal gland: Secondary | ICD-10-CM | POA: Diagnosis not present

## 2018-11-29 DIAGNOSIS — N200 Calculus of kidney: Secondary | ICD-10-CM | POA: Diagnosis not present

## 2018-11-30 MED ORDER — METOPROLOL SUCCINATE ER 50 MG PO TB24: 50.0000 mg | ORAL_TABLET | Freq: Every day | ORAL | 3 refills | Status: DC

## 2018-12-05 ENCOUNTER — Other Ambulatory Visit: Payer: Self-pay | Admitting: Nurse Practitioner

## 2018-12-09 ENCOUNTER — Other Ambulatory Visit: Payer: Self-pay | Admitting: Internal Medicine

## 2018-12-09 ENCOUNTER — Encounter: Payer: Self-pay | Admitting: Internal Medicine

## 2018-12-09 MED ORDER — FLECAINIDE ACETATE 50 MG PO TABS: ORAL_TABLET | ORAL | 3 refills | Status: DC

## 2018-12-09 NOTE — Telephone Encounter (Signed)
Pt's medication was sent to pt's pharmacy as requested. Confirmation received.  °

## 2018-12-10 ENCOUNTER — Other Ambulatory Visit: Payer: Self-pay

## 2018-12-13 ENCOUNTER — Encounter: Payer: Self-pay | Admitting: Internal Medicine

## 2018-12-13 ENCOUNTER — Other Ambulatory Visit: Payer: Self-pay

## 2018-12-13 ENCOUNTER — Ambulatory Visit (INDEPENDENT_AMBULATORY_CARE_PROVIDER_SITE_OTHER): Payer: PPO | Admitting: Internal Medicine

## 2018-12-13 VITALS — BP 115/70 | HR 70 | Temp 97.3°F | Resp 16 | Ht 68.0 in | Wt 201.2 lb

## 2018-12-13 DIAGNOSIS — M545 Low back pain, unspecified: Secondary | ICD-10-CM

## 2018-12-13 DIAGNOSIS — I4891 Unspecified atrial fibrillation: Secondary | ICD-10-CM | POA: Diagnosis not present

## 2018-12-13 DIAGNOSIS — D509 Iron deficiency anemia, unspecified: Secondary | ICD-10-CM

## 2018-12-13 NOTE — Progress Notes (Signed)
Pre visit review using our clinic review tool, if applicable. No additional management support is needed unless otherwise documented below in the visit note. 

## 2018-12-13 NOTE — Progress Notes (Signed)
Subjective:    Patient ID: Ronald [name], male    DOB: 1934/06/09, 84 y.o.   MRN: [id number]  DOS:  12/13/2018 Type of visit - description: Acute Few weeks ago, he developed a right paraspinal pain, suspected it was from his kidney, so he  went to see his urologist, CT was done, did not show any etiology that could account for his pain. Since then, pain has decreased. It is milder, when it comes it lasts few hours. Increased with bending, decreased with Tylenol. Eating has no effect on the pain.  Also chart is reviewed, notes from cardiology and GI.  Review of Systems  No fever chills No nausea, vomiting. Appetite is somewhat decreased Denies any rash at the right flank area.  Past Medical History:  Diagnosis Date  . Arthritis   . BPH (benign prostatic hypertrophy)    f/u @ Biopsy, s/p bx 2009 apro (-)  . Calculus of kidney    s/p R percutaneus nephrostomy @ [facility]  . Cataract   . Colon polyp   . Complication of anesthesia    violent upon waking up  . Diabetes mellitus    pre diabetic  . Diverticulosis   . Early cataract    left  . Edema leg    asymetric , L > R , [place] neg for DVT 09-2010  . GERD (gastroesophageal reflux disease)   . Hemorrhoids   . Hypercalcemia    parathyroidectomy 04/2008  . Hyperlipidemia   . Hypertension   . Iron deficiency anemia   . Polyp, stomach   . Wears dentures   . Wears glasses     Past Surgical History:  Procedure Laterality Date  . A-FLUTTER ABLATION N/A 08/12/2017   Procedure: A-FLUTTER ABLATION;  Surgeon: Deboraha Sprang, MD;  Location: [place] [facility];  Service: Cardiovascular;  Laterality: N/A;  . APPENDECTOMY    . BIOPSY  09/20/2018   Procedure: BIOPSY;  Surgeon: Rush Landmark Telford Nab., MD;  Location: [place];  Service: Gastroenterology;;  . CATARACT EXTRACTION Right   . COLONOSCOPY  12/16/2011   Procedure: COLONOSCOPY;  Surgeon: Inda Castle, MD;  [facility];  Service: Endoscopy;  Laterality: N/A;   . ENDOSCOPIC MUCOSAL RESECTION N/A 09/20/2018   Procedure: ENDOSCOPIC MUCOSAL RESECTION;  Surgeon: Rush Landmark Telford Nab., MD;  Location: [place];  Service: Gastroenterology;  Laterality: N/A;  . ESOPHAGOGASTRODUODENOSCOPY N/A 01/17/2013   Procedure: ESOPHAGOGASTRODUODENOSCOPY (EGD);  Surgeon: Inda Castle, MD;  Location: [facility];  Service: Endoscopy;  Laterality: N/A;  . ESOPHAGOGASTRODUODENOSCOPY (EGD) WITH PROPOFOL N/A 09/20/2018   Procedure: ESOPHAGOGASTRODUODENOSCOPY (EGD) WITH PROPOFOL;  Surgeon: Rush Landmark Telford Nab., MD;  Location: [place];  Service: Gastroenterology;  Laterality: N/A;  . EXTERNAL EAR SURGERY  01/2016   BCC removal  . EYE SURGERY  infant   "lazy eye"  . FLEXIBLE SIGMOIDOSCOPY  02/09/2012   Procedure: FLEXIBLE SIGMOIDOSCOPY;  Surgeon: Inda Castle, MD;  [facility];  Service: Endoscopy;  Laterality: N/A;  . HEMORRHOID BANDING  02/09/2012   Procedure: HEMORRHOID BANDING;  Surgeon: Inda Castle, MD;  [facility];  Service: Endoscopy;  Laterality: N/A;  . HEMOSTASIS CLIP PLACEMENT  09/20/2018   Procedure: HEMOSTASIS CLIP PLACEMENT;  Surgeon: Irving Copas., MD;  Location: [place];  Service: Gastroenterology;;  . HOT HEMOSTASIS N/A 01/17/2013   Procedure: HOT HEMOSTASIS (ARGON PLASMA COAGULATION/BICAP);  Surgeon: Inda Castle, MD;  Location: [facility];  Service: Endoscopy;  Laterality: N/A;  . KIDNEY STONE  SURGERY    . MULTIPLE TOOTH EXTRACTIONS    . PARATHYROIDECTOMY  04/2008  . POLYPECTOMY  09/20/2018   Procedure: POLYPECTOMY;  Surgeon: Mansouraty, Telford Nab., MD;  Location: [place];  Service: Gastroenterology;;  . Clide Deutscher  09/20/2018   Procedure: Clide Deutscher;  Surgeon: Irving Copas., MD;  Location: [place];  Service: Gastroenterology;;  . TONSILLECTOMY      Social History   Socioeconomic History  . Marital status: Widowed    Spouse name: Not on file  . Number of children:  3  . Years of education: Not on file  . Highest education level: Not on file  Occupational History  . Occupation: retired   Scientific laboratory technician  . Financial resource strain: Not on file  . Food insecurity    Worry: Not on file    Inability: Not on file  . Transportation needs    Medical: Not on file    Non-medical: Not on file  Tobacco Use  . Smoking status: Former Smoker    Years: 25.00    Types: Cigarettes    Quit date: 11/30/1981    Years since quitting: 37.0  . Smokeless tobacco: Never Used  Substance and Sexual Activity  . Alcohol use: Yes    Alcohol/week: 7.0 standard drinks    Types: 7 Glasses of wine per week    Comment: occasional beer with dinner  . Drug use: No  . Sexual activity: Not on file  Lifestyle  . Physical activity    Days per week: Not on file    Minutes per session: Not on file  . Stress: Not on file  Relationships  . Social Herbalist on phone: Not on file    Gets together: Not on file    Attends religious service: Not on file    Active member of club or organization: Not on file    Attends meetings of clubs or organizations: Not on file    Relationship status: Not on file  . Intimate partner violence    Fear of current or ex partner: Not on file    Emotionally abused: Not on file    Physically abused: Not on file    Forced sexual activity: Not on file  Other Topics Concern  . Not on file  Social History Narrative   Retired, single, has  a girlfriend , 3 children  (one lives in town)   Daughter has a copy of his living will      Allergies as of 12/13/2018      Reactions   Cucumber Extract Nausea And Vomiting   Sulfonamide Derivatives Other (See Comments)   "don't know reaction" per pt      Medication List       Accurate as of December 13, 2018  1:54 PM. If you have any questions, ask your nurse or doctor.        apixaban 2.5 MG Tabs tablet Commonly known as: Eliquis Take 1 tablet (2.5 mg total) by mouth 2 (two) times daily.    benazepril 20 MG tablet Commonly known as: LOTENSIN Take 1 tablet (20 mg total) by mouth daily.   cholecalciferol 1000 units tablet Commonly known as: VITAMIN D Take 1,000 Units by mouth every evening.   flecainide 100 MG tablet Commonly known as: TAMBOCOR Take 1 tablet (100 mg) by mouth once daily in the evening   flecainide 50 MG tablet Commonly known as: TAMBOCOR Take 1 tablet (50 mg) by mouth once daily in  the morning   furosemide 40 MG tablet Commonly known as: LASIX Take 1 tablet by mouth once daily   iron polysaccharides 150 MG capsule Commonly known as: NIFEREX Take 150 mg by mouth daily at 12 noon.   metolazone 5 MG tablet Commonly known as: ZAROXOLYN Take 5 mg by mouth once a week.   metoprolol succinate 50 MG 24 hr tablet Commonly known as: TOPROL-XL Take 1 tablet (50 mg total) by mouth daily.   multivitamin with minerals Tabs tablet Take 1 tablet by mouth daily at 12 noon.   NIFEdipine 60 MG 24 hr tablet Commonly known as: PROCARDIA XL/NIFEDICAL XL Take 1 tablet (60 mg total) by mouth daily.   omeprazole 40 MG capsule Commonly known as: PriLOSEC Take 1 capsule (40 mg total) by mouth 2 (two) times daily before a meal.   potassium chloride 10 MEQ tablet Commonly known as: KLOR-CON TAKE 1 TABLET BY MOUTH ONCE A WEEK TO  BE  TAKEN  ONLY  IN  CONJUNCTION  WITH  ZAROXOLYN   PreserVision AREDS 2 Caps Take 1 capsule by mouth every evening.   Saw Palmetto 450 MG Caps Take 450 mg by mouth every evening.   simvastatin 40 MG tablet Commonly known as: ZOCOR Take 1 tablet (40 mg total) by mouth at bedtime.           Objective:   Physical Exam BP 115/70 (BP Location: Left Arm, Patient Position: Sitting, Cuff Size: Small)   Pulse 70   Temp (!) 97.3 F (36.3 C) (Temporal)   Resp 16   Ht 5\' 8"  (1.727 m)   Wt 201 lb 4 oz (91.3 kg)   SpO2 97%   BMI 30.60 kg/m  General:   Well developed, NAD, BMI noted.  HEENT:  Normocephalic . Face symmetric,  atraumatic Lungs:  CTA B Normal respiratory effort, no intercostal retractions, no accessory muscle use. Heart: RRR,  no murmur.  no pretibial edema bilaterally  Abdomen:  Not distended, soft, non-tender. No rebound or rigidity. MSK: Slightly TTP at the mid thoracic paraspinal area.    Skin: Not pale. Not jaundice Neurologic:  alert & oriented X3.  Speech normal, gait appropriate for age and unassisted Psych--  Cognition and judgment appear intact.  Cooperative with normal attention span and concentration.  Behavior appropriate. No anxious or depressed appearing.     Assessment     Assessment   DM HTN Hyperlipidemia Osteopenia T score -2.0 (2010), T score -1.6 (12-2014); T score -1.3 (03-2018). LEG Edema,L>R , chronic, [place] neg DVT 2012 CV: Atrial tachycardia/flutter DX 02/2016 GU: --BPH  --ED --Elevated PSA, s/p multiple bx (last 2009?neg), last OV w/ urology 2014, last  PSA 2014 ~ 13, declines further eval as off 02-2016 Hypercalcemia, parathyroidectomy 2010 Renal stones, nephrostomy 2009 at [facility]:  --Anemia: EGD 12-2012  (Bx: H pylory neg) [id number] gastric polyposis, Bx H Pylory +, was rec treatment Anemia felt to be d/t chronic bleed from  gastric polyp H Pylori + 11-2014, treated, f/u breath test (-)  --GERD --cscope 12-2011 >>> multiple hyperplastic  Polyps, tics  --12-2011 Banding internal hemorrhoids 4 --02-2012: Flex sig d/t discomfort  BCC , removed from L ear CT abd 06/2017 @ urology: RML 1.7 cm nodule, gallbladder stone, hepatic steatosis.  PLAN Mid back pain: Recent CT at urology show it right renal stones, cholelithiasis, stable right lower lobe nodule, bony structures without lytic lesions.  Nothing to account for his pain.   Suspect pain is MSK related, he  is already better, we agreed on rest, Tylenol, heating pad and call if not better. Edema: Chronic issue, weekly Zaroxolyn not helping, recommend to stop it. Atrial fibrillation: Saw  cardiology 11/09/2018, felt to be stable Iron deficiency anemia:  Saw GI, had a EGD 09-2018: Multiple polyps noted, congested erythematous membranes.  See full report.  Pathology showed hyperplastic gastric polyp, no H. Pylori. No malignancy. Last CBC showed improved hemoglobin.  Reassess on RTC RTC already scheduled 03-2019  Today, I spent more than 25  min with the patient: >50% of the time counseling regards mid back pain, reviewing and explaining the patient the findings of the most recent CT.  We also reviewed issues related to iron deficiency anemia.

## 2018-12-13 NOTE — Patient Instructions (Addendum)
  Tylenol  500 mg OTC 2 tabs a day every 8 hours as needed for pain  Heating pad  Call if not back to normal in 2-3 weeks

## 2018-12-15 NOTE — Assessment & Plan Note (Signed)
Mid back pain: Recent CT at urology show it right renal stones, cholelithiasis, stable right lower lobe nodule, bony structures without lytic lesions.  Nothing to account for his pain.   Suspect pain is MSK related, he is already better, we agreed on rest, Tylenol, heating pad and call if not better. Edema: Chronic issue, weekly Zaroxolyn not helping, recommend to stop it. Atrial fibrillation: Saw cardiology 11/09/2018, felt to be stable Iron deficiency anemia:  Saw GI, had a EGD 09-2018: Multiple polyps noted, congested erythematous membranes.  See full report.  Pathology showed hyperplastic gastric polyp, no H. Pylori. No malignancy. Last CBC showed improved hemoglobin.  Reassess on RTC RTC already scheduled 03-2019

## 2018-12-22 ENCOUNTER — Encounter: Payer: Self-pay | Admitting: Internal Medicine

## 2018-12-24 ENCOUNTER — Other Ambulatory Visit: Payer: Self-pay | Admitting: *Deleted

## 2018-12-24 ENCOUNTER — Telehealth: Payer: Self-pay | Admitting: *Deleted

## 2018-12-24 DIAGNOSIS — D509 Iron deficiency anemia, unspecified: Secondary | ICD-10-CM

## 2018-12-24 DIAGNOSIS — K3189 Other diseases of stomach and duodenum: Secondary | ICD-10-CM

## 2018-12-24 NOTE — Telephone Encounter (Signed)
-----   Message from Thornton Park, MD sent at 12/24/2018 12:07 PM EDT ----- Regarding: FW: repeat EGD Dr. Caro Laroche, Thank you for the follow-up.  Taejah Ohalloran, please order a CT-Abdomen with contrast to evaluate the gastric submucosal nodule (as recommended by Dr. Rush Landmark at the time of his procedure in July). Will address need for EGD +/- EUS after reviewing the CT scan results. I would be happy to see the patient in follow-up first if he would like to discuss these recommendations with me.   Thanks.  KLB ----- Message ----- From: Colon Branch, MD Sent: 12/23/2018   4:44 PM EDT To: Thornton Park, MD Subject: repeat EGD                                     Dr  Tarri Glenn: The patient asked me about his last endoscopy (09/2018), the report calls for a repeat EGD and a CT.  Just bringing this to your attention. Thank youJP

## 2018-12-24 NOTE — Telephone Encounter (Signed)
-----   Message from Thornton Park, MD sent at 12/24/2018 12:07 PM EDT ----- Regarding: FW: repeat EGD Dr. Caro Laroche, Thank you for the follow-up.  Deziray Nabi, please order a CT-Abdomen with contrast to evaluate the gastric submucosal nodule (as recommended by Dr. Rush Landmark at the time of his procedure in July). Will address need for EGD +/- EUS after reviewing the CT scan results. I would be happy to see the patient in follow-up first if he would like to discuss these recommendations with me.   Thanks.  KLB ----- Message ----- From: Colon Branch, MD Sent: 12/23/2018   4:44 PM EDT To: Thornton Park, MD Subject: repeat EGD                                     Dr  Tarri Glenn: The patient asked me about his last endoscopy (09/2018), the report calls for a repeat EGD and a CT.  Just bringing this to your attention. Thank youJP

## 2018-12-24 NOTE — Telephone Encounter (Signed)
Dr. Rush Landmark,   I attempted to schedule this patient for a CT-Abd but when I called to schedule the patient at radiology scheduling, they resisted scheduling the CT abd only. I wanted to CONFIRM with you, the physician that you indeed wanted at CT Abdomen ONLY and NOT a CT abd/pelvis. Please advise.     Nursing notes: Spoke to the patient, notified him of CT on 10/22 at 12:00 pm. He will come in for labs on 10/21 (creatinine/BUN) and pick up contrast which has been placed at the reception desk.

## 2018-12-25 NOTE — Telephone Encounter (Signed)
Brittani, I see that the patient underwent a noncontrasted CT abdomen/pelvis as a CT urogram in September (notation in the chart as a scan note). No significant evidence of a lesion was noted in the distal stomach/antrum - I think it is okay to hold on CT scan imaging for now and proceed with an EUS to evaluate the potential submucosal lesion. Let us proceed with a follow-up EGD/EUS with EMR in the coming weeks. [name], please work with Francetta Found to find an appropriate spot for an EGD/EUS 90-minute slot. Also want to repeat a CBC/iron/TIBC/ferritin/INR a couple of weeks before my procedure. Anticoagulation should be held as per prior procedure. Thanks. GM

## 2018-12-27 ENCOUNTER — Encounter: Payer: Self-pay | Admitting: *Deleted

## 2018-12-27 NOTE — Telephone Encounter (Signed)
CT scan cancelled per Dr. Rush Landmark. Patient notified. [name] notified to schedule procedures as recommended by Dr. Rush Landmark.

## 2018-12-28 ENCOUNTER — Other Ambulatory Visit: Payer: Self-pay

## 2018-12-28 ENCOUNTER — Telehealth: Payer: Self-pay

## 2018-12-28 DIAGNOSIS — D509 Iron deficiency anemia, unspecified: Secondary | ICD-10-CM

## 2018-12-28 DIAGNOSIS — K3189 Other diseases of stomach and duodenum: Secondary | ICD-10-CM

## 2018-12-28 NOTE — Telephone Encounter (Signed)
Ramond Dial, RPH 57 minutes ago (11:33 AM)     Patient with diagnosis of afib on Eliquis for anticoagulation.    Procedure: EUS/EGD Date of procedure: 01/31/2019  CHADS2-VASc score of  4 (HTN, AGE, DM2,AGE)  CrCl 37 ml/min  Per office protocol, patient can hold Eliquis for 1-2 days prior to procedure.

## 2018-12-28 NOTE — Telephone Encounter (Signed)
[facility] Pre-operative Risk Assessment     Request for surgical clearance:     Endoscopy Procedure  What type of surgery is being performed?     EUS/EGD  When is this surgery scheduled?     11/23  What type of clearance is required ?   Pharmacy  Are there any medications that need to be held prior to surgery and how long? Eliquis   Practice name and name of physician performing surgery?      [facility]  What is your office phone and fax number?      Phone- [phone]  [phone]  Anesthesia type (None, local, MAC, general) ?       MAC

## 2018-12-28 NOTE — Telephone Encounter (Signed)
EUS scheduled, pt instructed and medications reviewed.  Patient instructions mailed to home.  Patient to call with any questions or concerns. Covid testing instructions also discussed and understanding verbalized.   The pt will have labs about 2 weeks prior to the procedure and order in Epic. I have also sent a letter to Dr Caryl Comes regarding the Eliquis hold.

## 2018-12-28 NOTE — Telephone Encounter (Signed)
Patient with diagnosis of afib on Eliquis for anticoagulation.    Procedure: EUS/EGD Date of procedure: 01/31/2019  CHADS2-VASc score of  4 (HTN, AGE, DM2,AGE)  CrCl 37 ml/min  Per office protocol, patient can hold Eliquis for 1-2 days prior to procedure.

## 2018-12-28 NOTE — Telephone Encounter (Signed)
The pt has been advised to hold Eliquis 2 days prior to the procedure The pt has been advised of the information and verbalized understanding.

## 2018-12-28 NOTE — Telephone Encounter (Signed)
   Primary Cardiologist: Dr. Jan Fireman  [place] 84 year old male with prior medical history of essential hypertension, atrial flutter, GERD, benign neoplasm of stomach, DM 2, hyperparathyroidism, calculus of kidney, hyperlipidemia, ED, elevated PSA, colonic polyps, and lung nodule.  Chart reviewed as part of pre-operative protocol coverage. Given past medical history and time since last visit, based on ACC/AHA guidelines, Ronald [name] would be at acceptable risk for the planned procedure without further cardiovascular testing.   His RCRI is class II risk 0.9% of a major cardiac event.   CHADS2-VASc score of  4 (HTN, AGE, DM2,AGE),CrCl 37 ml/min  Per office protocol, patient can hold Eliquis for 1-2 days prior to procedure.    I will route this recommendation to the requesting party via Epic fax function and remove from pre-op pool.  Please call with questions.  Ronald Ng. Angalena Cousineau NP-C 12/28/2018, 11:40 AM

## 2018-12-30 ENCOUNTER — Ambulatory Visit (HOSPITAL_COMMUNITY): Payer: PPO

## 2019-01-17 ENCOUNTER — Other Ambulatory Visit (INDEPENDENT_AMBULATORY_CARE_PROVIDER_SITE_OTHER): Payer: PPO

## 2019-01-17 DIAGNOSIS — K3189 Other diseases of stomach and duodenum: Secondary | ICD-10-CM | POA: Diagnosis not present

## 2019-01-17 DIAGNOSIS — D509 Iron deficiency anemia, unspecified: Secondary | ICD-10-CM | POA: Diagnosis not present

## 2019-01-17 LAB — CBC WITH DIFFERENTIAL/PLATELET
Basophils Absolute: 0 10*3/uL (ref 0.0–0.1)
Basophils Relative: 0.6 % (ref 0.0–3.0)
Eosinophils Absolute: 0.1 10*3/uL (ref 0.0–0.7)
Eosinophils Relative: 1.7 % (ref 0.0–5.0)
HCT: 44.7 % (ref 39.0–52.0)
Hemoglobin: 15.1 g/dL (ref 13.0–17.0)
Lymphocytes Relative: 24.8 % (ref 12.0–46.0)
Lymphs Abs: 1.8 10*3/uL (ref 0.7–4.0)
MCHC: 33.8 g/dL (ref 30.0–36.0)
MCV: 91.8 fl (ref 78.0–100.0)
Monocytes Absolute: 0.5 10*3/uL (ref 0.1–1.0)
Monocytes Relative: 7.5 % (ref 3.0–12.0)
Neutro Abs: 4.7 10*3/uL (ref 1.4–7.7)
Neutrophils Relative %: 65.4 % (ref 43.0–77.0)
Platelets: 186 10*3/uL (ref 150.0–400.0)
RBC: 4.87 Mil/uL (ref 4.22–5.81)
RDW: 15.1 % (ref 11.5–15.5)
WBC: 7.2 10*3/uL (ref 4.0–10.5)

## 2019-01-17 LAB — IBC PANEL
Iron: 81 ug/dL (ref 42–165)
Saturation Ratios: 27.2 % (ref 20.0–50.0)
Transferrin: 213 mg/dL (ref 212.0–360.0)

## 2019-01-17 LAB — FERRITIN: Ferritin: 27.3 ng/mL (ref 22.0–322.0)

## 2019-01-17 LAB — PROTIME-INR
INR: 1.2 ratio — ABNORMAL HIGH (ref 0.8–1.0)
Prothrombin Time: 14 s — ABNORMAL HIGH (ref 9.6–13.1)

## 2019-01-25 ENCOUNTER — Encounter: Payer: Self-pay | Admitting: Internal Medicine

## 2019-01-27 ENCOUNTER — Telehealth: Payer: Self-pay | Admitting: *Deleted

## 2019-01-27 ENCOUNTER — Other Ambulatory Visit (HOSPITAL_COMMUNITY)
Admission: RE | Admit: 2019-01-27 | Discharge: 2019-01-27 | Disposition: A | Discharge status: Home / Self Care | Payer: PPO | Source: Ambulatory Visit | Attending: Gastroenterology | Admitting: Gastroenterology

## 2019-01-27 ENCOUNTER — Other Ambulatory Visit: Payer: Self-pay

## 2019-01-27 DIAGNOSIS — Z20828 Contact with and (suspected) exposure to other viral communicable diseases: Secondary | ICD-10-CM | POA: Insufficient documentation

## 2019-01-27 DIAGNOSIS — Z01812 Encounter for preprocedural laboratory examination: Secondary | ICD-10-CM | POA: Insufficient documentation

## 2019-01-27 NOTE — Telephone Encounter (Signed)
Spoke with the patient who appeared to be confused concerning having more labs drawn today. The patient had labs per [place] on 11/9 (approx 2 weeks prior to procedure). The patient was told to go over to [facility] for his COVID screening. Patient verbalized understanding.

## 2019-01-28 ENCOUNTER — Encounter (HOSPITAL_COMMUNITY): Payer: Self-pay | Admitting: *Deleted

## 2019-01-28 ENCOUNTER — Other Ambulatory Visit: Payer: Self-pay

## 2019-01-28 LAB — NOVEL CORONAVIRUS, NAA (HOSP ORDER, SEND-OUT TO REF LAB; TAT 18-24 HRS): SARS-CoV-2, NAA: NOT DETECTED

## 2019-01-28 NOTE — Progress Notes (Signed)
Spoke with pt for pre-op call. Pt has hx of Afib and A-flutter. Sees Dr. Caryl Comes. States he had an ablation and has not had any re-occurrences. Pt is on Eliquis, was told to hold it 2 days prior to surgery. Last dose is today, 01/28/19. Pt has cardiac clearance noted in Epic on 01/27/19.   EKG -  11/09/18 Echo - 03/30/18  Last A1C was 6.5 on 09/01/18.   Instructed pt to hold Multivitamins and Herbal medication as of today prior to surgery.  Pt had his Covid test done 01/27/19. Result is pending. Pt states he has been in quarantine since the test was done and that he needs to stay in quarantine until Monday.  Pt informed of visitation policy and voiced understanding.

## 2019-01-31 ENCOUNTER — Ambulatory Visit (HOSPITAL_COMMUNITY)
Admission: RE | Admit: 2019-01-31 | Discharge: 2019-01-31 | Disposition: A | Discharge status: Home / Self Care | Payer: PPO | Attending: Gastroenterology | Admitting: Gastroenterology

## 2019-01-31 ENCOUNTER — Other Ambulatory Visit: Payer: Self-pay

## 2019-01-31 ENCOUNTER — Telehealth: Payer: Self-pay

## 2019-01-31 ENCOUNTER — Encounter (HOSPITAL_COMMUNITY): Payer: Self-pay | Admitting: Certified Registered Nurse Anesthetist

## 2019-01-31 ENCOUNTER — Encounter (HOSPITAL_COMMUNITY)
Admission: RE | Disposition: A | Discharge status: Home / Self Care | Payer: Self-pay | Source: Home / Self Care | Attending: Gastroenterology

## 2019-01-31 ENCOUNTER — Ambulatory Visit (HOSPITAL_COMMUNITY): Payer: PPO | Admitting: Certified Registered Nurse Anesthetist

## 2019-01-31 DIAGNOSIS — M19049 Primary osteoarthritis, unspecified hand: Secondary | ICD-10-CM | POA: Insufficient documentation

## 2019-01-31 DIAGNOSIS — Z882 Allergy status to sulfonamides status: Secondary | ICD-10-CM | POA: Diagnosis not present

## 2019-01-31 DIAGNOSIS — I4891 Unspecified atrial fibrillation: Secondary | ICD-10-CM | POA: Insufficient documentation

## 2019-01-31 DIAGNOSIS — E119 Type 2 diabetes mellitus without complications: Secondary | ICD-10-CM | POA: Insufficient documentation

## 2019-01-31 DIAGNOSIS — Z87891 Personal history of nicotine dependence: Secondary | ICD-10-CM | POA: Insufficient documentation

## 2019-01-31 DIAGNOSIS — D509 Iron deficiency anemia, unspecified: Secondary | ICD-10-CM

## 2019-01-31 DIAGNOSIS — I10 Essential (primary) hypertension: Secondary | ICD-10-CM | POA: Diagnosis not present

## 2019-01-31 DIAGNOSIS — K317 Polyp of stomach and duodenum: Secondary | ICD-10-CM | POA: Diagnosis not present

## 2019-01-31 DIAGNOSIS — Z885 Allergy status to narcotic agent status: Secondary | ICD-10-CM | POA: Diagnosis not present

## 2019-01-31 DIAGNOSIS — K3189 Other diseases of stomach and duodenum: Secondary | ICD-10-CM

## 2019-01-31 DIAGNOSIS — K219 Gastro-esophageal reflux disease without esophagitis: Secondary | ICD-10-CM | POA: Diagnosis not present

## 2019-01-31 DIAGNOSIS — E785 Hyperlipidemia, unspecified: Secondary | ICD-10-CM | POA: Insufficient documentation

## 2019-01-31 HISTORY — PX: HEMOSTASIS CLIP PLACEMENT: SHX6857

## 2019-01-31 HISTORY — DX: Personal history of urinary calculi: Z87.442

## 2019-01-31 HISTORY — DX: Prediabetes: R73.03

## 2019-01-31 HISTORY — PX: FINE NEEDLE ASPIRATION: SHX5430

## 2019-01-31 HISTORY — PX: EUS: SHX5427

## 2019-01-31 HISTORY — PX: POLYPECTOMY: SHX5525

## 2019-01-31 HISTORY — DX: Cardiac arrhythmia, unspecified: I49.9

## 2019-01-31 HISTORY — PX: SCLEROTHERAPY: SHX6841

## 2019-01-31 HISTORY — PX: ESOPHAGOGASTRODUODENOSCOPY (EGD) WITH PROPOFOL: SHX5813

## 2019-01-31 HISTORY — PX: ENDOSCOPIC MUCOSAL RESECTION: SHX6839

## 2019-01-31 SURGERY — UPPER ENDOSCOPIC ULTRASOUND (EUS) RADIAL
Anesthesia: Monitor Anesthesia Care

## 2019-01-31 MED ORDER — LACTATED RINGERS IV SOLN
INTRAVENOUS | Status: AC | PRN
  Administered 2019-01-31: 1000 mL via INTRAVENOUS

## 2019-01-31 MED ORDER — EPINEPHRINE 1 MG/10ML IJ SOSY
PREFILLED_SYRINGE | INTRAMUSCULAR | Status: AC
  Filled 2019-01-31: qty 10

## 2019-01-31 MED ORDER — METOCLOPRAMIDE HCL 5 MG/ML IJ SOLN: 5.0000 mg | Freq: Once | INTRAMUSCULAR | Status: DC

## 2019-01-31 MED ORDER — EPHEDRINE SULFATE 50 MG/ML IJ SOLN
INTRAMUSCULAR | Status: DC | PRN
  Administered 2019-01-31: 10 mg via INTRAVENOUS

## 2019-01-31 MED ORDER — SODIUM CHLORIDE 0.9 % IV SOLN: INTRAVENOUS | Status: DC

## 2019-01-31 MED ORDER — PROPOFOL 10 MG/ML IV BOLUS
INTRAVENOUS | Status: DC | PRN
  Administered 2019-01-31: 10 mg via INTRAVENOUS
  Administered 2019-01-31: 20 mg via INTRAVENOUS
  Administered 2019-01-31: 15 mg via INTRAVENOUS

## 2019-01-31 MED ORDER — SODIUM CHLORIDE (PF) 0.9 % IJ SOLN
PREFILLED_SYRINGE | INTRAMUSCULAR | Status: DC | PRN
  Administered 2019-01-31: 3.5 mL

## 2019-01-31 MED ORDER — PROPOFOL 500 MG/50ML IV EMUL
INTRAVENOUS | Status: DC | PRN
  Administered 2019-01-31: 75 ug/kg/min via INTRAVENOUS

## 2019-01-31 SURGICAL SUPPLY — 15 items

## 2019-01-31 NOTE — Anesthesia Preprocedure Evaluation (Addendum)
Anesthesia Evaluation  Patient identified by MRN, date of birth, ID band Patient awake    Reviewed: Allergy & Precautions, NPO status , Patient's Chart, lab work & pertinent test results, reviewed documented beta blocker date and time   Airway Mallampati: II  TM Distance: >3 FB Neck ROM: Full    Dental no notable dental hx. (+) Edentulous Upper, Partial Lower   Pulmonary neg pulmonary ROS, former smoker,    Pulmonary exam normal breath sounds clear to auscultation       Cardiovascular hypertension, Pt. on medications and Pt. on home beta blockers Normal cardiovascular exam+ dysrhythmias Atrial Fibrillation  Rhythm:Regular Rate:Normal  TTE 03/2018 - Left ventricle: The cavity size was normal. Systolic function was normal. The estimated ejection fraction was in the range of 60% to 65%. Wall motion was normal; there were no regional wall motion abnormalities. Doppler parameters are consistent with abnormal left ventricular relaxation (grade 1 diastolic dysfunction). Doppler parameters are consistent with indeterminate ventricular filling pressure. - Aortic valve: Transvalvular velocity was within the normal range. There was no stenosis. There was mild regurgitation. Regurgitation pressure half-time: 774 ms. - Mitral valve: Transvalvular velocity was within the normal range.There was no evidence for stenosis. There was mild regurgitation. - Left atrium: The atrium was severely dilated. - Right ventricle: The cavity size was normal. Wall thickness was normal. Systolic function was normal. - Right atrium: The atrium was mildly dilated. - Atrial septum: No defect or patent foramen ovale was identified by color flow Doppler. - Tricuspid valve: There was mild regurgitation. - Pulmonary arteries: Systolic pressure was within the normal range. PA peak pressure: 38 mm Hg (S).   Neuro/Psych negative neurological ROS  negative psych ROS    GI/Hepatic Neg liver ROS, GERD  Medicated and Controlled,  Endo/Other  diabetes  Renal/GU negative Renal ROS  negative genitourinary   Musculoskeletal negative musculoskeletal ROS (+)   Abdominal   Peds  Hematology  (+) Blood dyscrasia (on eliquis), ,   Anesthesia Other Findings   Reproductive/Obstetrics                           Anesthesia Physical Anesthesia Plan  ASA: III  Anesthesia Plan: MAC   Post-op Pain Management:    Induction: Intravenous  PONV Risk Score and Plan: Propofol infusion and Treatment may vary due to age or medical condition  Airway Management Planned: Natural Airway  Additional Equipment:   Intra-op Plan:   Post-operative Plan:   Informed Consent: I have reviewed the patients History and Physical, chart, labs and discussed the procedure including the risks, benefits and alternatives for the proposed anesthesia with the patient or authorized representative who has indicated his/her understanding and acceptance.     Dental advisory given  Plan Discussed with: CRNA  Anesthesia Plan Comments:         Anesthesia Quick Evaluation

## 2019-01-31 NOTE — Telephone Encounter (Signed)
-----   Message from Irving Copas., MD sent at 01/31/2019  4:34 PM EST ----- KB, Patient is still having oozing from multiple polyps. But since his iron indices it was good I just worked on a few of them today. He also had the previously noted submucosal nodule which was a greater than 2 cm so I went ahead and sampled it via FNB. I will update you with the final pathology. Shakeyla Giebler, please set up a set of labs for 4 to 6 weeks from now including a CBC/iron/TIBC/ferritin improved under Dr. Tarri Glenn name. We will find out and decide on final follow-up after pathology returns. Thanks. GM

## 2019-01-31 NOTE — Op Note (Signed)
[facility] Patient Name: Ronald [name] Procedure Date : 01/31/2019 MRN: [id number] Attending MD: Justice Britain , MD Date of Birth: 03-15-1934 CSN: [id number] Age: 84 Admit Type: Outpatient Procedure:                Upper EUS Indications:              Gastric deformity on endoscopy/Subepithelial tumor                            versus extrinsic compression, Follow-up of gastric                            polyps, For therapy of gastric polyps Providers:                Justice Britain, MD, Vista Lawman, RN, Elspeth Cho Tech., Technician, Virgilio Belling. Huel Cote, CRNA Referring MD:             Thornton Park MD, MD, Kathlene November, MD Medicines:                Monitored Anesthesia Care Complications:            No immediate complications. Estimated Blood Loss:     Estimated blood loss was minimal. Procedure:                Pre-Anesthesia Assessment:                           - Prior to the procedure, a History and Physical                            was performed, and patient medications and                            allergies were reviewed. The patient's tolerance of                            previous anesthesia was also reviewed. The risks                            and benefits of the procedure and the sedation                            options and risks were discussed with the patient.                            All questions were answered, and informed consent                            was obtained. Prior Anticoagulants: The patient has                            taken Eliquis (apixaban), last dose was 3 days  prior to procedure. ASA Grade Assessment: III - A                            patient with severe systemic disease. After                            reviewing the risks and benefits, the patient was                            deemed in satisfactory condition to undergo the   procedure.                           After obtaining informed consent, the endoscope was                            passed under direct vision. Throughout the                            procedure, the patient's blood pressure, pulse, and                            oxygen saturations were monitored continuously. The                            [id number]-[id number] ([id number]) Olympus therapeutic                            gastroscope was introduced through the mouth, and                            advanced to the second part of duodenum. The                            [id number] ([id number]) Olympus Radial EUS scope was                            introduced through the mouth, and advanced to the                            stomach for ultrasound examination. The upper EUS                            was accomplished without difficulty. The patient                            tolerated the procedure. Scope In: Scope Out: Findings:      ENDOSCOPIC FINDING: :      No gross lesions were noted in the entire esophagus.      Multiple 5 to 30 mm pedunculated, semi-pedunculated, and sessile polyps       with some showing active oozing and others with stigmata of recent       bleeding and others without evidence of oozing were found in the entire       examined stomach. Preparations were  made for mucosal resection of three       lesions that were actively oozing. They were on the anterior wall. A       1:10,000 solution of epinephrine was injected to raise lesion 1. Snare       mucosal resection was performed. Resection and retrieval were complete.       To prevent bleeding after mucosal resection, two hemostatic clips were       successfully placed (MR conditional). There was no bleeding at the end       of the procedure. Preparations were made for mucosal resection. A       1:10,000 solution of epinephrine was injected to raise lesion 2. Snare       mucosal resection was performed. Resection and retrieval were  complete.       To prevent bleeding after mucosal resection, two hemostatic clips were       successfully placed (MR conditional). There was no bleeding at the end       of the procedure. Lesion 3, a polyp was removed with a hot snare.       Resection and retrieval were complete. To prevent bleeding after the       polypectomy, one hemostatic clip was successfully placed (MR       conditional). There was no bleeding during, or at the end, of the       procedure.      A single 18 mm submucosal papule (nodule) with no bleeding and no       stigmata of recent bleeding was found on the posterior wall of the       gastric antrum - this was previously tunnel biopsied and negative.      Multiple previously placed endoclips were found in the gastric body.      A single 5 mm sessile polyp with no bleeding was found in the duodenal       bulb. The polyp was removed with a cold snare. Resection and retrieval       were complete. To prevent bleeding after the polypectomy, one hemostatic       clip was successfully placed (MR conditional). There was no bleeding at       the end of the procedure.      No other gross lesions were noted in the entire examined duodenum up to       D2.      ENDOSONOGRAPHIC FINDING: :      An oval intramural (subepithelial) lesion was found in the antrum of the       stomach. The lesion was hypoechoic. Sonographically, the lesion appeared       to originate from the muscularis propria (Layer 4). The lesion measured       22 mm (in maximum thickness). The lesion also measured 9 mm in diameter.       The outer endosonographic borders were well defined. Fine needle biopsy       was performed. Color Doppler imaging was utilized prior to needle       puncture to confirm a lack of significant vascular structures within the       needle path. Three passes were made with the 22 gauge Acquire ultrasound       core biopsy needle using a transgastric approach. A visible core of        tissue was obtained. A preliminary cytologic examination was performed.       Final  cytology results are pending.      Diffuse wall thickening was visualized endosonographically in the body       of the stomach. This appeared to primarily be due to thickening within       the luminal interface/superficial mucosa (Layer 1) and deep mucosa       (Layer 2). The thickness of the abnormal layers 27 mm.      Multiple stones were visualized endosonographically in the gallbladder.       The stones were round. They were hyperechoic and characterized by       shadowing.      The celiac region was visualized. Impression:               EGD Impression:                           - No gross lesions in esophagus.                           - Multiple gastric polyps. 3 were resected and                            retrieved (2 via Mucosal resection & 1 via Hot                            snare polypectomy). Clips (MR conditional) were                            placed.                           - A single submucosal papule (nodule) found in the                            stomach.                           - Endoclips were found in the stomach (previously                            placed).                           - A single duodenal polyp. Resected and retrieved.                            Clip (MR conditional) was placed.                           EUS Impression:                           - An intramural (subepithelial) lesion was found in                            the antrum of the stomach. The lesion appeared to  originate from within the muscularis propria (Layer                            4). Tissue was obtained from this exam, and results                            are pending. 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 Fine needle biopsy performed.                           - Wall thickening was seen in the body of the                            stomach. The thickening appeared to be primarily                            within the luminal interface/superficial mucosa                            (Layer 1) and deep mucosa (Layer 2).                           - Multiple stones were visualized                            endosonographically in the gallbladder. Recommendation:           - The patient will be observed post-procedure,                            until all discharge criteria are met.                           - Discharge patient to home.                           - Patient has a contact number available for                            emergencies. The signs and symptoms of potential                            delayed complications were discussed with the                            patient. Return to normal activities tomorrow.  Written discharge instructions were provided to the                            patient.                           - Full liquid diet for 24 hours. Then can advance                            tomorrow afternoon/evening into a soft diet x                            72-hours then may increase back to normal diet.                           - Continue PPI twice daily for now.                           - Continue Oral Iron.                           - Recheck Hgb/Hct & Iron indicies in 4-6 weeks to                            ensure stability.                           - May restart Eliquis in 72 hours to decrease risk                            of post-intervention bleeding as much as possible.                           - Monitor for signs/symptoms of bleeding,                            perforation, and infection. If issues please call                            our number to get further assistance as needed.                            - Consider repeat EGD in 4-6 months based on Iron                            indices.                           - Await cytology results and await path results.                           - If evidence of a GIST will consider referral to  Oncology if just a Spindle cell lesion then                            consider repeat EUS in 6-12 months.                           - The findings and recommendations were discussed                            with the patient.                           - The findings and recommendations were discussed                            with the designated responsible adult. Procedure Code(s):        --- Professional ---                           [phone], Esophagogastroduodenoscopy, flexible,                            transoral; with endoscopic mucosal resection                           [phone], Esophagogastroduodenoscopy, flexible,                            transoral; with transendoscopic ultrasound-guided                            intramural or transmural fine needle                            aspiration/biopsy(s), (includes endoscopic                            ultrasound examination limited to the esophagus,                            stomach or duodenum, and adjacent structures) Diagnosis Code(s):        --- Professional ---                           K31.7, Polyp of stomach and duodenum                           K31.89, Other diseases of stomach and duodenum CPT copyright 2019 [employer]. All rights reserved. The codes documented in this report are preliminary and upon coder review may  be revised to meet current compliance requirements. Justice Britain, MD 01/31/2019 4:09:50 PM Number of Addenda: 0

## 2019-01-31 NOTE — H&P (Signed)
GASTROENTEROLOGY PROCEDURE H&P NOTE   Primary Care Physician: Colon Branch, MD  HPI: Ronald [name] is a 84 y.o. male who presents for EGD/EUS with EMR.  Past Medical History:  Diagnosis Date  . Arthritis    one finger  . BPH (benign prostatic hypertrophy)    f/u @ Biopsy, s/p bx 2009 apro (-)  . Cataract   . Colon polyp   . Complication of anesthesia    violent upon waking up (only 1 time)   . Diverticulosis   . Dysrhythmia    A-flutter/Afib  . Early cataract    left  . Edema leg    asymetric , L > R , [place] neg for DVT 09-2010  . GERD (gastroesophageal reflux disease)   . Hemorrhoids   . History of kidney stones   . Hypercalcemia    parathyroidectomy 04/2008  . Hyperlipidemia   . Hypertension   . Iron deficiency anemia   . Polyp, stomach   . Pre-diabetes   . Wears dentures   . Wears glasses    Past Surgical History:  Procedure Laterality Date  . A-FLUTTER ABLATION N/A 08/12/2017   Procedure: A-FLUTTER ABLATION;  Surgeon: Deboraha Sprang, MD;  Location: [facility];  Service: Cardiovascular;  Laterality: N/A;  . APPENDECTOMY    . BIOPSY  09/20/2018   Procedure: BIOPSY;  Surgeon: Rush Landmark Telford Nab., MD;  Location: [facility];  Service: Gastroenterology;;  . CATARACT EXTRACTION Right   . COLONOSCOPY  12/16/2011   Procedure: COLONOSCOPY;  Surgeon: Inda Castle, MD;  Location: [facility];  Service: Endoscopy;  Laterality: N/A;  . ENDOSCOPIC MUCOSAL RESECTION N/A 09/20/2018   Procedure: ENDOSCOPIC MUCOSAL RESECTION;  Surgeon: Rush Landmark Telford Nab., MD;  Location: [facility];  Service: Gastroenterology;  Laterality: N/A;  . ESOPHAGOGASTRODUODENOSCOPY N/A 01/17/2013   Procedure: ESOPHAGOGASTRODUODENOSCOPY (EGD);  Surgeon: Inda Castle, MD;  Location: [facility];  Service: Endoscopy;  Laterality: N/A;  . ESOPHAGOGASTRODUODENOSCOPY (EGD) WITH PROPOFOL N/A 09/20/2018   Procedure: ESOPHAGOGASTRODUODENOSCOPY (EGD) WITH PROPOFOL;  Surgeon: Rush Landmark  Telford Nab., MD;  Location: [facility];  Service: Gastroenterology;  Laterality: N/A;  . EXTERNAL EAR SURGERY  01/2016   BCC removal  . EYE SURGERY  infant   "lazy eye"  . FLEXIBLE SIGMOIDOSCOPY  02/09/2012   Procedure: FLEXIBLE SIGMOIDOSCOPY;  Surgeon: Inda Castle, MD;  Location: [facility];  Service: Endoscopy;  Laterality: N/A;  . HEMORRHOID BANDING  02/09/2012   Procedure: HEMORRHOID BANDING;  Surgeon: Inda Castle, MD;  Location: [facility];  Service: Endoscopy;  Laterality: N/A;  . HEMOSTASIS CLIP PLACEMENT  09/20/2018   Procedure: HEMOSTASIS CLIP PLACEMENT;  Surgeon: Irving Copas., MD;  Location: [facility];  Service: Gastroenterology;;  . HOT HEMOSTASIS N/A 01/17/2013   Procedure: HOT HEMOSTASIS (ARGON PLASMA COAGULATION/BICAP);  Surgeon: Inda Castle, MD;  Location: [facility];  Service: Endoscopy;  Laterality: N/A;  . KIDNEY STONE SURGERY    . MULTIPLE TOOTH EXTRACTIONS    . PARATHYROIDECTOMY  04/2008  . POLYPECTOMY  09/20/2018   Procedure: POLYPECTOMY;  Surgeon: Mansouraty, Telford Nab., MD;  Location: [facility];  Service: Gastroenterology;;  . Clide Deutscher  09/20/2018   Procedure: Clide Deutscher;  Surgeon: Irving Copas., MD;  Location: [facility];  Service: Gastroenterology;;  . TONSILLECTOMY     Current Facility-Administered Medications  Medication Dose Route Frequency Provider Last Rate Last Dose  . lactated ringers infusion    Continuous PRN Mansouraty, Telford Nab., MD   1,000 mL at 01/31/19  1238   Allergies  Allergen Reactions  . Cucumber Extract Nausea And Vomiting  . Sulfonamide Derivatives Other (See Comments)    "don't know reaction" per pt   Family History  Problem Relation Age of Onset  . Heart attack Father 87  . Heart attack Mother   . Colon cancer Neg Hx   . Prostate cancer Neg Hx   . Diabetes Neg Hx    Social History   Socioeconomic History  . Marital status: Widowed    Spouse name: Not on file  . Number of  children: 3  . Years of education: Not on file  . Highest education level: Not on file  Occupational History  . Occupation: retired   Scientific laboratory technician  . Financial resource strain: Not on file  . Food insecurity    Worry: Not on file    Inability: Not on file  . Transportation needs    Medical: Not on file    Non-medical: Not on file  Tobacco Use  . Smoking status: Former Smoker    Years: 25.00    Types: Cigarettes    Quit date: 11/30/1981    Years since quitting: 37.1  . Smokeless tobacco: Never Used  Substance and Sexual Activity  . Alcohol use: Yes    Alcohol/week: 7.0 standard drinks    Types: 7 Glasses of wine per week    Comment: occasional beer with dinner  . Drug use: No  . Sexual activity: Not on file  Lifestyle  . Physical activity    Days per week: Not on file    Minutes per session: Not on file  . Stress: Not on file  Relationships  . Social Herbalist on phone: Not on file    Gets together: Not on file    Attends religious service: Not on file    Active member of club or organization: Not on file    Attends meetings of clubs or organizations: Not on file    Relationship status: Not on file  . Intimate partner violence    Fear of current or ex partner: Not on file    Emotionally abused: Not on file    Physically abused: Not on file    Forced sexual activity: Not on file  Other Topics Concern  . Not on file  Social History Narrative   Retired, single, has  a girlfriend , 3 children  (one lives in town)   Daughter has a copy of his living will    Physical Exam: Vital signs in last 24 hours: Resp:  [21] 21 (11/23 1234) BP: (139)/(83) 139/83 (11/23 1234) SpO2:  [96 %] 96 % (11/23 1234) Weight:  [90.7 kg] 90.7 kg (11/23 1234)   GEN: NAD EYE: Sclerae anicteric ENT: MMM CV: Non-tachycardic GI: Soft, NT/ND NEURO:  Alert & Oriented x 3  Lab Results: No results for input(s): WBC, HGB, HCT, PLT in the last 72 hours. BMET No results for  input(s): NA, K, CL, CO2, GLUCOSE, BUN, CREATININE, CALCIUM in the last 72 hours. LFT No results for input(s): PROT, ALBUMIN, AST, ALT, ALKPHOS, BILITOT, BILIDIR, IBILI in the last 72 hours. PT/INR No results for input(s): LABPROT, INR in the last 72 hours.   Impression / Plan: This is a 84 y.o.male who presents for EGD/EUS with EMR.  The risks and benefits of endoscopic evaluation were discussed with the patient; these include but are not limited to the risk of perforation, infection, bleeding, missed lesions, lack of  diagnosis, severe illness requiring hospitalization, as well as anesthesia and sedation related illnesses.  The patient is agreeable to proceed.    Justice Britain, MD [facility] Office # [phone]

## 2019-01-31 NOTE — Anesthesia Postprocedure Evaluation (Signed)
Anesthesia Post Note  Patient: Ronald [name]  Procedure(s) Performed: UPPER ENDOSCOPIC ULTRASOUND (EUS) RADIAL (N/A ) ESOPHAGOGASTRODUODENOSCOPY (EGD) WITH PROPOFOL (N/A ) FINE NEEDLE ASPIRATION (FNA) LINEAR POLYPECTOMY HEMOSTASIS CLIP PLACEMENT SCLEROTHERAPY FOREIGN BODY REMOVAL     Patient location during evaluation: Endoscopy Anesthesia Type: MAC Level of consciousness: awake and alert Pain management: pain level controlled Vital Signs Assessment: post-procedure vital signs reviewed and stable Respiratory status: spontaneous breathing, nonlabored ventilation and respiratory function stable Cardiovascular status: stable and blood pressure returned to baseline Postop Assessment: no apparent nausea or vomiting Anesthetic complications: no    Last Vitals:  Vitals:   01/31/19 1607 01/31/19 1617  BP: 123/62 130/69  Pulse: (!) 57 (!) 58  Resp: 18 (!) 22  SpO2: 93% 93%    Last Pain:  Vitals:   01/31/19 1548  TempSrc: Temporal  PainSc: 0-No pain                 Franca Stakes,W. EDMOND

## 2019-01-31 NOTE — Anesthesia Procedure Notes (Signed)
Procedure Name: MAC Date/Time: 01/31/2019 2:23 PM Performed by: Mariea Clonts, CRNA Pre-anesthesia Checklist: Patient identified, Emergency Drugs available, Suction available, Patient being monitored and Timeout performed Patient Re-evaluated:Patient Re-evaluated prior to induction Oxygen Delivery Method: Simple face mask and Nasal cannula

## 2019-01-31 NOTE — Discharge Instructions (Signed)
YOU HAD AN ENDOSCOPIC PROCEDURE TODAY: Refer to the procedure report and other information in the discharge instructions given to you for any specific questions about what was found during the examination. If this information does not answer your questions, please call [place] office at [phone] to clarify.   YOU SHOULD EXPECT: Some feelings of bloating in the abdomen. Passage of more gas than usual. Walking can help get rid of the air that was put into your GI tract during the procedure and reduce the bloating. If you had a lower endoscopy (such as a colonoscopy or flexible sigmoidoscopy) you may notice spotting of blood in your stool or on the toilet paper. Some abdominal soreness may be present for a day or two, also.  DIET: Your first meal following the procedure should be a light meal and then it is ok to progress to your normal diet. A half-sandwich or bowl of soup is an example of a good first meal. Heavy or fried foods are harder to digest and may make you feel nauseous or bloated. Drink plenty of fluids but you should avoid alcoholic beverages for 24 hours. If you had a esophageal dilation, please see attached instructions for diet.    ACTIVITY: Your care partner should take you home directly after the procedure. You should plan to take it easy, moving slowly for the rest of the day. You can resume normal activity the day after the procedure however YOU SHOULD NOT DRIVE, use power tools, machinery or perform tasks that involve climbing or major physical exertion for 24 hours (because of the sedation medicines used during the test).   SYMPTOMS TO REPORT IMMEDIATELY: A gastroenterologist can be reached at any hour. Please call [phone]  for any of the following symptoms:  Following lower endoscopy (colonoscopy, flexible sigmoidoscopy) Excessive amounts of blood in the stool  Significant tenderness, worsening of abdominal pains  Swelling of the abdomen that is new, acute  Fever of 100 or  higher  Following upper endoscopy (EGD, EUS, ERCP, esophageal dilation) Vomiting of blood or coffee ground material  New, significant abdominal pain  New, significant chest pain or pain under the shoulder blades  Painful or persistently difficult swallowing  New shortness of breath  Black, tarry-looking or red, bloody stools  FOLLOW UP:  If any biopsies were taken you will be contacted by phone or by letter within the next 1-3 weeks. Call [phone]  if you have not heard about the biopsies in 3 weeks.  Please also call with any specific questions about appointments or follow up tests.   Return to normal activities tomorrow. Written discharge instructions were provided to the patient. - Full liquid diet for 24 hours. Then can advance tomorrow afternoon/evening into a soft diet x 72-hours then may increase back to normal diet. - Continue PPI twice daily for now. - Continue Oral Iron. - Recheck Hgb/Hct & Iron indicies in 4-6 weeks to ensure stability. - May restart Eliquis in 72 hours to decrease risk of post-intervention bleeding as much as possible

## 2019-01-31 NOTE — Transfer of Care (Signed)
Immediate Anesthesia Transfer of Care Note  Patient: Ronald [name]  Procedure(s) Performed: UPPER ENDOSCOPIC ULTRASOUND (EUS) RADIAL (N/A ) ESOPHAGOGASTRODUODENOSCOPY (EGD) WITH PROPOFOL (N/A ) FINE NEEDLE ASPIRATION (FNA) LINEAR POLYPECTOMY HEMOSTASIS CLIP PLACEMENT SCLEROTHERAPY FOREIGN BODY REMOVAL  Patient Location: Endoscopy Unit  Anesthesia Type:MAC  Level of Consciousness: awake, alert  and oriented  Airway & Oxygen Therapy: Patient Spontanous Breathing  Post-op Assessment: Report given to RN, Post -op Vital signs reviewed and stable and Patient moving all extremities X 4  Post vital signs: Reviewed and stable  Last Vitals:  Vitals Value Taken Time  BP 106/53 01/31/19 1548  Temp    Pulse 60 01/31/19 1549  Resp 15 01/31/19 1549  SpO2 92 % 01/31/19 1549  Vitals shown include unvalidated device data.  Last Pain:  Vitals:   01/31/19 1548  TempSrc: Temporal  PainSc: 0-No pain         Complications: No apparent anesthesia complications

## 2019-01-31 NOTE — Telephone Encounter (Signed)
Labs in [place] and pt notified to have labs in 4-6 weeks.

## 2019-02-01 ENCOUNTER — Encounter: Payer: Self-pay | Admitting: Gastroenterology

## 2019-02-01 LAB — SURGICAL PATHOLOGY

## 2019-02-02 LAB — CYTOLOGY - NON PAP

## 2019-02-05 ENCOUNTER — Other Ambulatory Visit: Payer: Self-pay | Admitting: Internal Medicine

## 2019-02-07 ENCOUNTER — Other Ambulatory Visit: Payer: Self-pay

## 2019-02-07 DIAGNOSIS — Z8509 Personal history of malignant neoplasm of other digestive organs: Secondary | ICD-10-CM

## 2019-02-08 ENCOUNTER — Telehealth: Payer: Self-pay | Admitting: Hematology

## 2019-02-08 NOTE — Telephone Encounter (Signed)
Received a new patient referral from [facility] for hx of GIST. Ronald [name] has been cld and scheduled to see Dr. Burr Medico on 12/14 at 3pm. Pt aware to arrive 15 minutes early.

## 2019-02-10 ENCOUNTER — Encounter: Payer: Self-pay | Admitting: Hematology

## 2019-02-18 NOTE — Progress Notes (Signed)
[place]   [phone] [phone] Note   Patient Care Team: Colon Branch, MD as PCP - General Deboraha Sprang, MD as PCP - Electrophysiology (Cardiology) Clent Jacks, MD as Consulting Physician (Ophthalmology) Ceasar Mons, MD as Consulting Physician (Urology)  Date of Service:  02/21/2019   CHIEF COMPLAINTS/PURPOSE OF CONSULTATION:  GIST   REFERRING PHYSICIAN:  Dr. 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 Right renal stones, in somewhat of a staghorn configuration. No hydronephrosis. Markedly enlarged prostate. No additional findings to explain the patient's symptoms.  2. Cholelithiasis.  3. Left adrenal adenoma 4. Aortic athrosclerosis. Caronary Artery calcification.    01/31/2019 Procedure    EUS by Dr. Rush Landmark 01/31/19  EGD Impression: - No gross lesions in esophagus. - Multiple gastric polyps. 3 were resected and retrieved (2 via Mucosal resection & 1 via Hot snare polypectomy). Clips (MR conditional) were placed. - A single submucosal papule (nodule) found in the stomach. - Endoclips were found in the stomach (previously placed). - A single duodenal polyp. Resected and retrieved. Clip (MR conditional) was placed. EUS Impression: - An intramural (subepithelial) lesion was found in the antrum of the stomach. The lesion appeared to originate from within the muscularis propria (Layer 4). Tissue was obtained from this exam, and results are pending. However, the  endosonographic appearance is suspicious for a stromal cell (smooth muscle) neoplasm, of indeterminate biological behavior. Fine needle biopsy performed. - Wall thickening was seen in the body of the stomach. The thickening appeared to be primarily within the luminal interface/superficial mucosa (Layer 1) and deep mucosa (Layer 2). - Multiple stones were visualized endosonographically in the gallbladder.   01/31/2019 Initial Biopsy   FINAL MICROSCOPIC DIAGNOSIS: 01/31/19  A. POSTERIOR WALL OF ANTRUM NODULE, FINE NEEDLE ASPIRATION:  - Consistent with gastrointestinal stromal tumor.   FINAL MICROSCOPIC DIAGNOSIS:   A. DUODENUM, POLYPECTOMY:  - Hyperplastic polyp.   B. STOMACH, ANTERIOR WALL, POLYPECTOMY:  - Hyperplastic and fundic gland polyps.     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 male is a here because of GIST. The patient was referred by Dr. Rush Landmark. The patient presents to the clinic today accompanied by his daughter Ronald [name]. His daughter and his girlfriend both have medical backgrounds.   His GI was Dr. Currie Paris years ago and had repeat endoscopies for more than 30 polyps in the stomach. Last endoscopy was in 09/2018 which showed mass. He had anemia at the time so he was started on oral iron. He had dark stool due to this. He only had 1 dose of IV iron. He did not feel much difference but overall doing better. His daughter notes he is not very active  so mild anemia was not symptomatic. He denies GI symptoms except lower appetite around that time. His still ate adequately and was able to maintain weight. He notes chronic constipation which  was not onset with this. He notes lately he has had increase stool output than usual. No other changes. He notes his energy is moderate and stable. He notes significant swelling, L>R. This has been going on for years with no true etiology. He is on Lasix with compression socks. He notes his energy has mostly been low and his life was overall sedentary lately.    Socially he lives alone and has girlfriend. They live close together and can help each other. He is a retired Hotel manager. His wife passed from lung cancer years ago. He has 3 adult children. He will drink beer once nightly. He smoked 25 years 1/2ppd and quit in the 1980s.  They have a PMHx of GERD, cataract, LE Edema, pre-diabetic, HTN, s/p appendectomy, parathyroidectomy due to hypercalcemia, hemorrhoid, s/p lithotripsy, surgery and Afib ablation.    REVIEW OF SYSTEMS:    Constitutional: Denies fevers, chills or abnormal night sweats Eyes: Denies blurriness of vision, double vision or watery eyes Ears, nose, mouth, throat, and face: Denies mucositis or sore throat Respiratory: Denies cough, dyspnea or wheezes Cardiovascular: Denies palpitation, chest discomfort (+) Significant lower extremity swelling Gastrointestinal:  Denies nausea, heartburn (+) Chronic constipation Skin: Denies abnormal skin rashes Lymphatics: Denies new lymphadenopathy or easy bruising Neurological:Denies numbness, tingling or new weaknesses Behavioral/Psych: Mood is stable, no new changes  All other systems were reviewed with the patient and are negative.  MEDICAL HISTORY:  Past Medical History:  Diagnosis Date  . Arthritis    one finger  . BPH (benign prostatic hypertrophy)    f/u @ Biopsy, s/p bx 2009 apro (-)  . Cataract   . Colon polyp   . Complication of anesthesia    violent upon waking up (only 1 time)   . Diverticulosis   . Dysrhythmia    A-flutter/Afib  . Early cataract    left  . Edema leg    asymetric , L > R , [place] neg for DVT 09-2010  .  GERD (gastroesophageal reflux disease)   . Hemorrhoids   . History of kidney stones   . Hypercalcemia    parathyroidectomy 04/2008  . Hyperlipidemia   . Hypertension   . Iron deficiency anemia   . Polyp, stomach   . Pre-diabetes   . Wears dentures   . Wears glasses     SURGICAL HISTORY: Past Surgical History:  Procedure Laterality Date  . A-FLUTTER ABLATION N/A 08/12/2017   Procedure: A-FLUTTER ABLATION;  Surgeon: Deboraha Sprang, MD;  Location: [facility];  Service: Cardiovascular;  Laterality: N/A;  . APPENDECTOMY    . BIOPSY  09/20/2018   Procedure: BIOPSY;  Surgeon: Rush Landmark Telford Nab., MD;  Location: [place];  Service: Gastroenterology;;  . CATARACT EXTRACTION Right   . COLONOSCOPY  12/16/2011   Procedure: COLONOSCOPY;  Surgeon: Inda Castle, MD;  Location: [facility];  Service: Endoscopy;  Laterality: N/A;  . ENDOSCOPIC MUCOSAL RESECTION N/A 09/20/2018   Procedure: ENDOSCOPIC MUCOSAL RESECTION;  Surgeon: Rush Landmark Telford Nab., MD;  Location: [place];  Service: Gastroenterology;  Laterality: N/A;  . ENDOSCOPIC MUCOSAL RESECTION  01/31/2019   Procedure: ENDOSCOPIC MUCOSAL RESECTION;  Surgeon: Rush Landmark Telford Nab., MD;  Location: [facility];  Service: Gastroenterology;;  . ESOPHAGOGASTRODUODENOSCOPY N/A 01/17/2013   Procedure: ESOPHAGOGASTRODUODENOSCOPY (EGD);  Surgeon: Inda Castle,  MD;  Location: [facility];  Service: Endoscopy;  Laterality: N/A;  . ESOPHAGOGASTRODUODENOSCOPY (EGD) WITH PROPOFOL N/A 09/20/2018   Procedure: ESOPHAGOGASTRODUODENOSCOPY (EGD) WITH PROPOFOL;  Surgeon: Rush Landmark Telford Nab., MD;  Location: [place];  Service: Gastroenterology;  Laterality: N/A;  . ESOPHAGOGASTRODUODENOSCOPY (EGD) WITH PROPOFOL N/A 01/31/2019   Procedure: ESOPHAGOGASTRODUODENOSCOPY (EGD) WITH PROPOFOL;  Surgeon: Rush Landmark Telford Nab., MD;  Location: [place];  Service: Gastroenterology;  Laterality: N/A;  . EUS N/A 01/31/2019   Procedure:  UPPER ENDOSCOPIC ULTRASOUND (EUS) RADIAL;  Surgeon: Rush Landmark Telford Nab., MD;  Location: [place];  Service: Gastroenterology;  Laterality: N/A;  . EXTERNAL EAR SURGERY  01/2016   BCC removal  . EYE SURGERY  infant   "lazy eye"  . FINE NEEDLE ASPIRATION  01/31/2019   Procedure: FINE NEEDLE ASPIRATION (FNA) LINEAR;  Surgeon: Irving Copas., MD;  Location: [facility]COPY;  Service: Gastroenterology;;  . Otho Darner SIGMOIDOSCOPY  02/09/2012   Procedure: FLEXIBLE SIGMOIDOSCOPY;  Surgeon: Inda Castle, MD;  Location: [facility];  Service: Endoscopy;  Laterality: N/A;  . HEMORRHOID BANDING  02/09/2012   Procedure: HEMORRHOID BANDING;  Surgeon: Inda Castle, MD;  Location: [facility];  Service: Endoscopy;  Laterality: N/A;  . HEMOSTASIS CLIP PLACEMENT  09/20/2018   Procedure: HEMOSTASIS CLIP PLACEMENT;  Surgeon: Irving Copas., MD;  Location: [place];  Service: Gastroenterology;;  . HEMOSTASIS CLIP PLACEMENT  01/31/2019   Procedure: HEMOSTASIS CLIP PLACEMENT;  Surgeon: Irving Copas., MD;  Location: [place];  Service: Gastroenterology;;  . HOT HEMOSTASIS N/A 01/17/2013   Procedure: HOT HEMOSTASIS (ARGON PLASMA COAGULATION/BICAP);  Surgeon: Inda Castle, MD;  Location: [facility];  Service: Endoscopy;  Laterality: N/A;  . KIDNEY STONE SURGERY    . MULTIPLE TOOTH EXTRACTIONS    . PARATHYROIDECTOMY  04/2008  . POLYPECTOMY  09/20/2018   Procedure: POLYPECTOMY;  Surgeon: Mansouraty, Telford Nab., MD;  Location: [place];  Service: Gastroenterology;;  . POLYPECTOMY  01/31/2019   Procedure: POLYPECTOMY;  Surgeon: Irving Copas., MD;  Location: [place];  Service: Gastroenterology;;  . Clide Deutscher  09/20/2018   Procedure: Clide Deutscher;  Surgeon: Irving Copas., MD;  Location: [place];  Service: Gastroenterology;;  . Clide Deutscher  01/31/2019   Procedure: Clide Deutscher;  Surgeon: Irving Copas., MD;  Location: [place];  Service: Gastroenterology;;  . TONSILLECTOMY      SOCIAL HISTORY: Social History   Socioeconomic History  . Marital status: Widowed    Spouse name: Not on file  . Number of children: 3  . Years of education: Not on file  . Highest education level: Not on file  Occupational History  . Occupation: retired   Tobacco Use  . Smoking status: Former Smoker    Packs/day: 0.50    Years: 25.00    Pack years: 12.50    Types: Cigarettes    Quit date: 11/30/1981    Years since quitting: 37.2  . Smokeless tobacco: Never Used  Substance and Sexual Activity  . Alcohol use: Yes    Alcohol/week: 7.0 standard drinks    Types: 7 Glasses of wine per week    Comment: occasional beer with dinner  . Drug use: No  . Sexual activity: Not on file  Other Topics Concern  . Not on file  Social History Narrative   Retired, single, has  a girlfriend , 3 children  (one lives in town)   Daughter has a copy of his living will   Social Determinants of Radio broadcast assistant  Strain:   . Difficulty of Paying Living Expenses: Not on file  Food Insecurity:   . Worried About Charity fundraiser in the Last Year: Not on file  . Ran Out of Food in the Last Year: Not on file  Transportation Needs:   . Lack of Transportation (Medical): Not on file  . Lack of Transportation (Non-Medical): Not on file  Physical Activity:   . Days of Exercise per Week: Not on file  . Minutes of Exercise per Session: Not on file  Stress:   . Feeling of Stress : Not on file  Social Connections:   . Frequency of Communication with Friends and Family: Not on file  . Frequency of Social Gatherings with Friends and Family: Not on file  . Attends Religious Services: Not on file  . Active Member of Clubs or Organizations: Not on file  . Attends Archivist Meetings: Not on file  . Marital Status: Not on file  Intimate Partner Violence:   . Fear of Current or Ex-Partner: Not on file  . Emotionally  Abused: Not on file  . Physically Abused: Not on file  . Sexually Abused: Not on file    FAMILY HISTORY: Family History  Problem Relation Age of Onset  . Heart attack Father 23  . Heart attack Mother   . Colon cancer Neg Hx   . Prostate cancer Neg Hx   . Diabetes Neg Hx     ALLERGIES:  is allergic to cucumber extract and sulfonamide derivatives.  MEDICATIONS:  Current Outpatient Medications  Medication Sig Dispense Refill  . apixaban (ELIQUIS) 2.5 MG TABS tablet Take 1 tablet (2.5 mg total) by mouth 2 (two) times daily. 180 tablet 2  . benazepril (LOTENSIN) 20 MG tablet Take 1 tablet (20 mg total) by mouth daily. 90 tablet 3  . cholecalciferol (VITAMIN D) 1000 UNITS tablet Take 1,000 Units by mouth every evening.     . ferrous sulfate 325 (65 FE) MG tablet Take 325 mg by mouth daily with breakfast.    . flecainide (TAMBOCOR) 100 MG tablet Take 1 tablet (100 mg) by mouth once daily in the evening (Patient taking differently: Take 100 mg by mouth at bedtime. ) 90 tablet 3  . flecainide (TAMBOCOR) 50 MG tablet Take 1 tablet (50 mg) by mouth once daily in the morning (Patient taking differently: Take 50 mg by mouth every morning. ) 90 tablet 3  . furosemide (LASIX) 40 MG tablet Take 1 tablet by mouth once daily (Patient taking differently: Take 40 mg by mouth daily. ) 90 tablet 2  . metoprolol succinate (TOPROL-XL) 50 MG 24 hr tablet Take 1 tablet (50 mg total) by mouth daily. 90 tablet 3  . Multiple Vitamin (MULTIVITAMIN WITH MINERALS) TABS tablet Take 1 tablet by mouth daily at 12 noon.    . Multiple Vitamins-Minerals (PRESERVISION AREDS 2) CAPS Take 1 capsule by mouth every evening.     Marland Kitchen NIFEdipine (PROCARDIA XL/NIFEDICAL XL) 60 MG 24 hr tablet Take 1 tablet (60 mg total) by mouth daily. 90 tablet 1  . omeprazole (PRILOSEC) 40 MG capsule Take 1 capsule (40 mg total) by mouth 2 (two) times daily before a meal. 180 capsule 3  . Saw Palmetto 450 MG CAPS Take 450 mg by mouth every  evening.    . simvastatin (ZOCOR) 40 MG tablet Take 1 tablet (40 mg total) by mouth at bedtime. (Patient taking differently: Take 40 mg by mouth every evening. ) 90 tablet  3   No current facility-administered medications for this visit.    PHYSICAL EXAMINATION: ECOG PERFORMANCE STATUS: 1 - Symptomatic but completely ambulatory  Vitals:   02/21/19 1454  BP: 131/75  Pulse: (!) 59  Resp: 18  Temp: 98.7 F (37.1 C)  SpO2: 97%   Filed Weights   02/21/19 1454  Weight: 204 lb 8 oz (92.8 kg)    GENERAL:alert, no distress and comfortable SKIN: skin color, texture, turgor are normal, no rashes or significant lesions EYES: normal, Conjunctiva are pink and non-injected, sclera clear  NECK: supple, thyroid normal size, non-tender, without nodularity LYMPH:  no palpable lymphadenopathy in the cervical, axillary  LUNGS: clear to auscultation and percussion with normal breathing effort HEART: regular rate & rhythm and no murmurs (+) lower extremity edema, L>R ABDOMEN:abdomen soft, non-tender and normal bowel sounds Musculoskeletal:no cyanosis of digits and no clubbing  NEURO: alert & oriented x 3 with fluent speech, no focal motor/sensory deficits  LABORATORY DATA:  I have reviewed the data as listed CBC Latest Ref Rng & Units 01/17/2019 10/06/2018 09/01/2018  WBC 4.0 - 10.5 K/uL 7.2 9.0 8.8  Hemoglobin 13.0 - 17.0 g/dL 15.1 13.5 12.9(L)  Hematocrit 39.0 - 52.0 % 44.7 41.4 39.9  Platelets 150.0 - 400.0 K/uL 186.0 243.0 235.0    CMP Latest Ref Rng & Units 09/01/2018 05/28/2018 04/16/2018  Glucose 70 - 99 mg/dL 107(H) 103(H) 127(H)  BUN 6 - 23 mg/dL 33(H) 38(H) 33(H)  Creatinine 0.40 - 1.50 mg/dL 1.68(H) 1.77(H) 1.70(H)  Sodium 135 - 145 mEq/L 145 140 142  Potassium 3.5 - 5.1 mEq/L 3.9 3.9 3.6  Chloride 96 - 112 mEq/L 105 98 98  CO2 19 - 32 mEq/L 32 33(H) 27  Calcium 8.4 - 10.5 mg/dL 8.4 8.9 9.1  Total Protein 6.0 - 8.3 g/dL - 6.2 -  Total Bilirubin 0.2 - 1.2 mg/dL - 0.4 -  Alkaline Phos  39 - 117 U/L - 65 -  AST 0 - 37 U/L - 11 -  ALT 0 - 53 U/L - 14 -     RADIOGRAPHIC STUDIES: I have personally reviewed the radiological images as listed and agreed with the findings in the report. No results found.  ASSESSMENT & PLAN:  SHADARIUS JESKE is a 84 y.o. caucasian male with a history of  GERD, cataract, LE Edema, pre-diabetic, HTN, s/p appendectomy, parathyroidectomy due to hypercalcemia, hemorrhoid, s/p lithotripsy, surgery and Afib ablation.   1. Gastric GIST, cT2N0M0, unknown grade  -I reviewed his EGD, image findings (recent CT abd/pel at [facility]) and biopsy report with patient and his daughter in details.  -I will discuss his case in GI tumor Board. Given size of tumor 2.2cm, endoscopic resection is challenge, and surgical resection is recommended. Given his advanced age he may not tolerate the surgery well. Pt is very reluctant to consider surgery  -given the location in stomach, even if this is a high grade (grade can not be decided based on FNA),his risk of develop metastatic disease is very low. We discussed the option of watchful wait with endoscopy surveilliance.  -we will discuss his case in our GI tumor conference later this week -We discussed the role of adjuvant Gleevec to reduce risk of recurrence in high risk GIST,  His GIST is likely low risk, and I do not think he would need if if he undergo surgical resection.  We also discussed the benefit of Gleevec for metastatic GIST. -If patient decides to have endoscopy surveillance, he will  follow-up with Dr. Rush Landmark.  I will see him as needed in the future. -Physical exam overall benign with LE Edema.  -F/u open.    2. Iron deficient anemia  -Likely secondary to bleeding from stomach polyps -He has received 1 dose IV iron at [facility]. He is currently on oral iron.  -I recommend he monitor his anemia with labs every 2-3 months, continue oral iron and get IV iron as needed. He is agreeable.    3. Chronic  constipation -Has managed for years. Recently he has had more frequent BMs than usual.    4. Venous insufficiency, Significant LE edema (L>R) up to calf -He has had significant LE edema for years without unclear etiology  -On Lasix, he does not use compression socks. He denies LE pain.  -He is overall sedentary and does not ambulate as much, energy level and activity level is low and stable.    5. Genetic Testing to rule out polyposis syndrome -He has a h/o of more than 30 benign polyps in his stomach and multiple polyps in colon. Although he has not family history of cancer he may be eligible for genetic testing. This should be covered by insurance.  -He is interested in testing. I will refer him.    6. Comorbidities: GERD, Dysrhythmia s/p A Fib ablation, Pre-diabetic, enlarged prostate  -Continue medications and f/u with physicians.    PLAN:  -F/u open  -Send genetic referral  -will discuss his case in our GI tumor board later this week, about surgical resection versus endoscopy surveillance.    Orders Placed This Encounter  Procedures  . Ambulatory referral to Genetics    Referral Priority:   Routine    Referral Type:   Consultation    Referral Reason:   Specialty Services Required    Number of Visits Requested:   1    All questions were answered. The patient knows to call the clinic with any problems, questions or concerns. I spent 40 minutes counseling the patient face to face. The total time spent in the appointment was 55 minutes and more than 50% was on counseling.     Truitt Merle, MD 02/21/2019   I, Joslyn Devon, am acting as scribe for Truitt Merle, MD.   I have reviewed the above documentation for accuracy and completeness, and I agree with the above.

## 2019-02-19 ENCOUNTER — Encounter: Payer: Self-pay | Admitting: Internal Medicine

## 2019-02-21 ENCOUNTER — Other Ambulatory Visit: Payer: Self-pay

## 2019-02-21 ENCOUNTER — Encounter: Payer: Self-pay | Admitting: Hematology

## 2019-02-21 ENCOUNTER — Inpatient Hospital Stay: Payer: PPO | Attending: Hematology | Admitting: Hematology

## 2019-02-21 DIAGNOSIS — K5909 Other constipation: Secondary | ICD-10-CM | POA: Diagnosis not present

## 2019-02-21 DIAGNOSIS — M1990 Unspecified osteoarthritis, unspecified site: Secondary | ICD-10-CM | POA: Diagnosis not present

## 2019-02-21 DIAGNOSIS — K317 Polyp of stomach and duodenum: Secondary | ICD-10-CM | POA: Insufficient documentation

## 2019-02-21 DIAGNOSIS — Z79899 Other long term (current) drug therapy: Secondary | ICD-10-CM | POA: Insufficient documentation

## 2019-02-21 DIAGNOSIS — D509 Iron deficiency anemia, unspecified: Secondary | ICD-10-CM | POA: Diagnosis not present

## 2019-02-21 DIAGNOSIS — C49A2 Gastrointestinal stromal tumor of stomach: Secondary | ICD-10-CM

## 2019-02-21 DIAGNOSIS — E785 Hyperlipidemia, unspecified: Secondary | ICD-10-CM | POA: Diagnosis not present

## 2019-02-21 DIAGNOSIS — I10 Essential (primary) hypertension: Secondary | ICD-10-CM | POA: Insufficient documentation

## 2019-02-21 DIAGNOSIS — Z87891 Personal history of nicotine dependence: Secondary | ICD-10-CM | POA: Diagnosis not present

## 2019-02-21 DIAGNOSIS — I4891 Unspecified atrial fibrillation: Secondary | ICD-10-CM | POA: Insufficient documentation

## 2019-02-21 DIAGNOSIS — K5900 Constipation, unspecified: Secondary | ICD-10-CM

## 2019-02-21 DIAGNOSIS — Z8719 Personal history of other diseases of the digestive system: Secondary | ICD-10-CM | POA: Insufficient documentation

## 2019-02-21 DIAGNOSIS — N400 Enlarged prostate without lower urinary tract symptoms: Secondary | ICD-10-CM | POA: Insufficient documentation

## 2019-02-21 DIAGNOSIS — Z7901 Long term (current) use of anticoagulants: Secondary | ICD-10-CM | POA: Insufficient documentation

## 2019-02-21 DIAGNOSIS — R600 Localized edema: Secondary | ICD-10-CM | POA: Insufficient documentation

## 2019-02-21 DIAGNOSIS — R7303 Prediabetes: Secondary | ICD-10-CM | POA: Diagnosis not present

## 2019-02-21 DIAGNOSIS — I872 Venous insufficiency (chronic) (peripheral): Secondary | ICD-10-CM | POA: Diagnosis not present

## 2019-02-21 MED ORDER — OMEPRAZOLE 40 MG PO CPDR: 40.0000 mg | DELAYED_RELEASE_CAPSULE | Freq: Two times a day (BID) | ORAL | 3 refills | Status: DC

## 2019-02-22 ENCOUNTER — Telehealth: Payer: Self-pay | Admitting: Hematology

## 2019-02-22 NOTE — Telephone Encounter (Signed)
Per 12/14 los F/u as needed

## 2019-02-23 ENCOUNTER — Other Ambulatory Visit: Payer: Self-pay | Admitting: Internal Medicine

## 2019-02-24 ENCOUNTER — Telehealth: Payer: Self-pay | Admitting: Gastroenterology

## 2019-02-24 ENCOUNTER — Telehealth: Payer: Self-pay | Admitting: Hematology

## 2019-02-24 ENCOUNTER — Other Ambulatory Visit (INDEPENDENT_AMBULATORY_CARE_PROVIDER_SITE_OTHER): Payer: PPO

## 2019-02-24 DIAGNOSIS — K3189 Other diseases of stomach and duodenum: Secondary | ICD-10-CM

## 2019-02-24 DIAGNOSIS — D509 Iron deficiency anemia, unspecified: Secondary | ICD-10-CM

## 2019-02-24 LAB — CBC WITH DIFFERENTIAL/PLATELET
Basophils Absolute: 0.1 10*3/uL (ref 0.0–0.1)
Basophils Relative: 0.9 % (ref 0.0–3.0)
Eosinophils Absolute: 0.1 10*3/uL (ref 0.0–0.7)
Eosinophils Relative: 1.4 % (ref 0.0–5.0)
HCT: 41.7 % (ref 39.0–52.0)
Hemoglobin: 14.2 g/dL (ref 13.0–17.0)
Lymphocytes Relative: 21.5 % (ref 12.0–46.0)
Lymphs Abs: 2 10*3/uL (ref 0.7–4.0)
MCHC: 34 g/dL (ref 30.0–36.0)
MCV: 92.8 fl (ref 78.0–100.0)
Monocytes Absolute: 0.7 10*3/uL (ref 0.1–1.0)
Monocytes Relative: 7.8 % (ref 3.0–12.0)
Neutro Abs: 6.3 10*3/uL (ref 1.4–7.7)
Neutrophils Relative %: 68.4 % (ref 43.0–77.0)
Platelets: 211 10*3/uL (ref 150.0–400.0)
RBC: 4.49 Mil/uL (ref 4.22–5.81)
RDW: 13.5 % (ref 11.5–15.5)
WBC: 9.2 10*3/uL (ref 4.0–10.5)

## 2019-02-24 LAB — PROTIME-INR
INR: 1.2 ratio — ABNORMAL HIGH (ref 0.8–1.0)
Prothrombin Time: 14.2 s — ABNORMAL HIGH (ref 9.6–13.1)

## 2019-02-24 LAB — IBC PANEL
Iron: 79 ug/dL (ref 42–165)
Saturation Ratios: 27.5 % (ref 20.0–50.0)
Transferrin: 205 mg/dL — ABNORMAL LOW (ref 212.0–360.0)

## 2019-02-24 LAB — FERRITIN: Ferritin: 26.3 ng/mL (ref 22.0–322.0)

## 2019-02-24 NOTE — Telephone Encounter (Signed)
Ast OV 12/13/18 Last refill 02/23/18 #90/3 Next OV 04/06/19

## 2019-02-24 NOTE — Telephone Encounter (Addendum)
I called the patient on 02/22/2021 discussed the results of our multidisciplinary conference. He had had an opportunity to meet Dr. Burr Medico from oncology in regards to potential further work-up or management of the recently diagnosed stromal tumor most likely gastrointestinal stromal tumor of this stomach. After review of the imaging, the patient's pathology, and overall discussion with the multidisciplinary conference it was felt that a surgical resection could be offered to the patient. However after talking with the patient he would like to minimize surgical risk if possible and try to not need a surgical intervention unless it were absolutely necessary due to his medical issues and comorbidities and chronic anticoagulation. The pathology specimen showed evidence of most likely a GIST however not enough tissue was present to give reactive changes as to whether this was a high-grade or low-grade but being the size that it is it is most likely a lower grade or intermediate lesion. We know that per NCCN guidelines lesions such as GIST that are less than 2 cm in size are often just monitored with surveillance endoscopy and ultrasound and imaging.  This was around 22 mm in size and so the slightly larger than cut off use by the NCCN guidelines. When we reviewed the radiology it was not completely clear that a lesion could be identified on this noncontrasted study. He has not been able to get contrast due to his chronic renal insufficiency and desire of not pushing his kidneys to the brink of further renal insufficiency. After talking with the patient today, the patient would like to hold on surgical intervention. He also would like to try and hold on any endoscopic intervention or ultrasound if possible to try to minimize anesthesia risks. We discussed role of surveillance via imaging (would have to be noncontrasted study due to his chronic renal insufficiency) versus repeat endoscopic ultrasound  periodically. Also, we will have to see how his iron studies and blood counts look in regards to whether he would need an earlier repeat endoscopic intervention or not. I suspect that it would be reasonable for [place] to try to repeat an endoscopic ultrasound in 6 months versus try to repeat a noncontrasted CT of the abdomen to better define or see if we can see the lesion that I have found. I will discuss his case with Dr. Tarri Glenn, his primary gastroenterologist. He will not necessarily need follow-up with oncology unless we move forward with a considerable discussion/decision to move forward with surgery at which point he would also need a referral to either Dr. Johney Maine or Dr. Barry Dienes for consideration of robotic resection. The patient was appreciative for the call back and discussion.  He will be coming in the next couple of weeks to get his follow-up CBC and iron indices checked. We will tentatively place an endoscopic ultrasound for 6 months from now but I will discuss his case further with Dr. Tarri Glenn.   Justice Britain, MD [facility] Advanced [facility] Office # [phone]

## 2019-02-24 NOTE — Telephone Encounter (Signed)
Scheduled appt per 12/15 sch message - pt aware of genetics appt date and time

## 2019-02-24 NOTE — Telephone Encounter (Signed)
KB, agree. [name], please place reminder in system for 37-month follow up EGD/EUS 90 minute case in case we need to take out further polyps and to relook at the GIST in the antrum of stomach. Thanks. GM

## 2019-02-24 NOTE — Telephone Encounter (Signed)
Gabe, Thank you for the input. Follow-up EUS in 6 months sounds appropriate if he does not wish to pursue surgical intervention.

## 2019-02-25 ENCOUNTER — Other Ambulatory Visit: Payer: Self-pay

## 2019-02-25 DIAGNOSIS — Z8509 Personal history of malignant neoplasm of other digestive organs: Secondary | ICD-10-CM

## 2019-02-25 NOTE — Telephone Encounter (Signed)
Recall in Epic as requested

## 2019-02-28 ENCOUNTER — Encounter: Payer: Self-pay | Admitting: Licensed Clinical Social Worker

## 2019-02-28 ENCOUNTER — Inpatient Hospital Stay (HOSPITAL_BASED_OUTPATIENT_CLINIC_OR_DEPARTMENT_OTHER): Payer: PPO | Admitting: Licensed Clinical Social Worker

## 2019-02-28 DIAGNOSIS — K317 Polyp of stomach and duodenum: Secondary | ICD-10-CM | POA: Insufficient documentation

## 2019-02-28 DIAGNOSIS — Z1379 Encounter for other screening for genetic and chromosomal anomalies: Secondary | ICD-10-CM | POA: Diagnosis not present

## 2019-02-28 DIAGNOSIS — C49A2 Gastrointestinal stromal tumor of stomach: Secondary | ICD-10-CM | POA: Diagnosis not present

## 2019-02-28 DIAGNOSIS — Z86010 Personal history of colonic polyps: Secondary | ICD-10-CM

## 2019-02-28 NOTE — Progress Notes (Signed)
REFERRING PROVIDER: Truitt Merle, MD [place],  [place] [address]  PRIMARY PROVIDER:  Colon Branch, MD  PRIMARY REASON FOR VISIT:  1. Personal history of colonic polyps   2. 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 Gastric polyps      HISTORY OF PRESENT ILLNESS:   Ronald [name], a 84 y.o. male, was seen for a [facility] cancer genetics consultation at the request of Dr. Burr Medico due to a personal history of GIST and gastric polyps.  Ronald [name] presents to clinic today to discuss the possibility of a hereditary predisposition to cancer, genetic testing, and to further clarify his future cancer risks, as well as potential cancer risks for family members.   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  Patient reports having at least 2 endoscopies in the past, and 3 colonoscopies. He is unsure the total amount of gastric polyps he has had but says his most recent endoscopy revealed more than 30. He is unsure how many total colon polyps he has had but believes less than 10.   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 Right renal stones, in somewhat of a staghorn configuration. No hydronephrosis. Markedly enlarged prostate. No additional findings to explain the patient's symptoms.  2. Cholelithiasis.  3. Left adrenal adenoma 4. Aortic athrosclerosis. Caronary Artery calcification.    01/31/2019 Procedure    EUS by Dr. Rush Landmark 01/31/19  EGD Impression: - No gross  lesions in esophagus. - Multiple gastric polyps. 3 were resected and retrieved (2 via Mucosal resection & 1 via Hot snare polypectomy). Clips (MR conditional) were placed. - A single submucosal papule (nodule) found in the stomach. - Endoclips were found in the stomach (previously placed). - A single duodenal polyp. Resected and retrieved. Clip (MR conditional) was placed. EUS Impression: - An intramural (subepithelial) lesion was found in the antrum of the stomach. The lesion appeared to originate from within the muscularis propria (Layer 4). Tissue was obtained from this exam, and results are pending. However, the endosonographic appearance is suspicious for a stromal cell (smooth muscle) neoplasm, of indeterminate biological behavior. Fine needle biopsy performed. - Wall thickening was seen in the body of the stomach. The thickening appeared to be primarily within the luminal interface/superficial mucosa (Layer 1) and deep mucosa (Layer 2). - Multiple stones were visualized endosonographically in the gallbladder.   01/31/2019 Initial Biopsy   FINAL MICROSCOPIC DIAGNOSIS: 01/31/19  A. POSTERIOR WALL OF ANTRUM NODULE, FINE NEEDLE ASPIRATION:  - Consistent with gastrointestinal stromal tumor.   FINAL MICROSCOPIC DIAGNOSIS:   A. DUODENUM, POLYPECTOMY:  - Hyperplastic polyp.   B. STOMACH, ANTERIOR WALL, POLYPECTOMY:  - Hyperplastic and fundic gland polyps.     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 Arthritis    one finger  . BPH (benign prostatic hypertrophy)    f/u @ Biopsy, s/p bx 2009 apro (-)  . Cataract   . Colon polyp   . Complication of  anesthesia    violent upon waking up (only 1 time)   . Diverticulosis   . Dysrhythmia    A-flutter/Afib  . Early cataract    left  . Edema leg    asymetric , L > R , [place] neg for DVT 09-2010  . Gastric polyps   . GERD (gastroesophageal reflux disease)   . Hemorrhoids   . History of kidney stones   . Hypercalcemia    parathyroidectomy 04/2008  . Hyperlipidemia   . Hypertension   . Iron deficiency anemia   . Polyp, stomach   . Pre-diabetes   . Wears dentures   . Wears glasses     Past Surgical History:  Procedure Laterality Date  . A-FLUTTER ABLATION N/A 08/12/2017   Procedure: A-FLUTTER ABLATION;  Surgeon: Deboraha Sprang, MD;  Location: [facility];  Service: Cardiovascular;  Laterality: N/A;  . APPENDECTOMY    . BIOPSY  09/20/2018   Procedure: BIOPSY;  Surgeon: Rush Landmark Telford Nab., MD;  Location: [facility];  Service: Gastroenterology;;  . CATARACT EXTRACTION Right   . COLONOSCOPY  12/16/2011   Procedure: COLONOSCOPY;  Surgeon: Inda Castle, MD;  Location: [facility];  Service: Endoscopy;  Laterality: N/A;  . ENDOSCOPIC MUCOSAL RESECTION N/A 09/20/2018   Procedure: ENDOSCOPIC MUCOSAL RESECTION;  Surgeon: Rush Landmark Telford Nab., MD;  Location: [place];  Service: Gastroenterology;  Laterality: N/A;  . ENDOSCOPIC MUCOSAL RESECTION  01/31/2019   Procedure: ENDOSCOPIC MUCOSAL RESECTION;  Surgeon: Rush Landmark Telford Nab., MD;  Location: [facility];  Service: Gastroenterology;;  . ESOPHAGOGASTRODUODENOSCOPY N/A 01/17/2013   Procedure: ESOPHAGOGASTRODUODENOSCOPY (EGD);  Surgeon: Inda Castle, MD;  Location: [facility];  Service: Endoscopy;  Laterality: N/A;  . ESOPHAGOGASTRODUODENOSCOPY (EGD) WITH PROPOFOL N/A 09/20/2018   Procedure: ESOPHAGOGASTRODUODENOSCOPY (EGD) WITH PROPOFOL;  Surgeon: Rush Landmark Telford Nab., MD;  Location: [place];  Service: Gastroenterology;  Laterality: N/A;  . ESOPHAGOGASTRODUODENOSCOPY (EGD) WITH PROPOFOL N/A 01/31/2019    Procedure: ESOPHAGOGASTRODUODENOSCOPY (EGD) WITH PROPOFOL;  Surgeon: Rush Landmark Telford Nab., MD;  Location: [place];  Service: Gastroenterology;  Laterality: N/A;  . EUS N/A 01/31/2019   Procedure: UPPER ENDOSCOPIC ULTRASOUND (EUS) RADIAL;  Surgeon: Rush Landmark Telford Nab., MD;  Location: [place];  Service: Gastroenterology;  Laterality: N/A;  . EXTERNAL EAR SURGERY  01/2016   BCC removal  . EYE SURGERY  infant   "lazy eye"  . FINE NEEDLE ASPIRATION  01/31/2019   Procedure: FINE NEEDLE ASPIRATION (FNA) LINEAR;  Surgeon: Irving Copas., MD;  Location: [facility];  Service: Gastroenterology;;  . Otho Darner SIGMOIDOSCOPY  02/09/2012   Procedure: FLEXIBLE SIGMOIDOSCOPY;  Surgeon: Inda Castle, MD;  Location: [facility];  Service: Endoscopy;  Laterality: N/A;  . HEMORRHOID BANDING  02/09/2012   Procedure: HEMORRHOID BANDING;  Surgeon: Inda Castle, MD;  Location: [facility];  Service: Endoscopy;  Laterality: N/A;  . HEMOSTASIS CLIP PLACEMENT  09/20/2018   Procedure: HEMOSTASIS CLIP PLACEMENT;  Surgeon: Irving Copas., MD;  Location: [place];  Service: Gastroenterology;;  . HEMOSTASIS CLIP PLACEMENT  01/31/2019   Procedure: HEMOSTASIS CLIP PLACEMENT;  Surgeon: Irving Copas., MD;  Location: [facility];  Service: Gastroenterology;;  . HOT HEMOSTASIS N/A 01/17/2013  Procedure: HOT HEMOSTASIS (ARGON PLASMA COAGULATION/BICAP);  Surgeon: Inda Castle, MD;  Location: [facility];  Service: Endoscopy;  Laterality: N/A;  . KIDNEY STONE SURGERY    . MULTIPLE TOOTH EXTRACTIONS    . PARATHYROIDECTOMY  04/2008  . POLYPECTOMY  09/20/2018   Procedure: POLYPECTOMY;  Surgeon: Mansouraty, Telford Nab., MD;  Location: [place];  Service: Gastroenterology;;  . POLYPECTOMY  01/31/2019   Procedure: POLYPECTOMY;  Surgeon: Irving Copas., MD;  Location: [place];  Service: Gastroenterology;;  . Clide Deutscher  09/20/2018   Procedure: Clide Deutscher;   Surgeon: Irving Copas., MD;  Location: [facility];  Service: Gastroenterology;;  . Clide Deutscher  01/31/2019   Procedure: Clide Deutscher;  Surgeon: Irving Copas., MD;  Location: [place];  Service: Gastroenterology;;  . TONSILLECTOMY      Social History   Socioeconomic History  . Marital status: Widowed    Spouse name: Not on file  . Number of children: 3  . Years of education: Not on file  . Highest education level: Not on file  Occupational History  . Occupation: retired   Tobacco Use  . Smoking status: Former Smoker    Packs/day: 0.50    Years: 25.00    Pack years: 12.50    Types: Cigarettes    Quit date: 11/30/1981    Years since quitting: 37.2  . Smokeless tobacco: Never Used  Substance and Sexual Activity  . Alcohol use: Yes    Alcohol/week: 7.0 standard drinks    Types: 7 Glasses of wine per week    Comment: occasional beer with dinner  . Drug use: No  . Sexual activity: Not on file  Other Topics Concern  . Not on file  Social History Narrative   Retired, single, has  a girlfriend , 3 children  (one lives in town)   Daughter has a copy of his living will   Social Determinants of Radio broadcast assistant Strain:   . Difficulty of Paying Living Expenses: Not on file  Food Insecurity:   . Worried About Charity fundraiser in the Last Year: Not on file  . Ran Out of Food in the Last Year: Not on file  Transportation Needs:   . Lack of Transportation (Medical): Not on file  . Lack of Transportation (Non-Medical): Not on file  Physical Activity:   . Days of Exercise per Week: Not on file  . Minutes of Exercise per Session: Not on file  Stress:   . Feeling of Stress : Not on file  Social Connections:   . Frequency of Communication with Friends and Family: Not on file  . Frequency of Social Gatherings with Friends and Family: Not on file  . Attends Religious Services: Not on file  . Active Member of Clubs or Organizations: Not on file   . Attends Archivist Meetings: Not on file  . Marital Status: Not on file     FAMILY HISTORY:  We obtained a detailed, 4-generation family history.  Significant diagnoses are listed below: Family History  Problem Relation Age of Onset  . Heart attack Father 14  . Heart attack Mother   . Pneumonia Sister 6  . Colon cancer Neg Hx   . Prostate cancer Neg Hx   . Diabetes Neg Hx    Ronald [name] has 2 daughters and 1 son, no cancers. One of his daughters had a few colon polyps on colonoscopy. Ronald [name] had one sister who died at age 47 of pneumonia.  Ronald [name]'s mother died at 61, she had a brain tumor but he is unsure if this was cancerous. Patient had 4 maternal uncles and 1 maternal aunt, he has limited information about them. He does not believe anyone has had cancer, but is unsure. He is unsure if any maternal cousins have had cancer. Maternal grandmother died in her 65s, grandfather also died in his 22s and possibly had polyps.   Ronald [name]'s father died at 75 due to heart problems. Patient has no information about this side of the family, other than a paternal uncle but he does not have information about him. No information about paternal cousins or grandparents.  Ronald [name] is unaware of previous family history of genetic testing for hereditary cancer risks. Patient's maternal ancestors are of [place] and [place] descent, and paternal ancestors are of [place] descent. There is reported Jewish ancestry on both sides of his family but he is unsure if Ashkenazi Jewish. There is no known consanguinity.  GENETIC COUNSELING ASSESSMENT: Ronald [name] is a 84 y.o. male with a personal history which is somewhat suggestive of a hereditary cancer syndrome/hereditary polyposis syndrome and predisposition to cancer. We, therefore, discussed and recommended the following at today's visit.   DISCUSSION: We discussed that 5 - 10% of cancer is hereditary. We discussed that GISTs can be hereditary, but  we would be more concerned if he had multiple GISTs or if there was family history, although he does not have information about his father's side. Additionally, we discussed that stomach polyps can be seen in some hereditary conditions as well, such as with FAP. He reports having taken Prilosec for many years, which could also explain some of the stomach polyps.  We discussed that testing is beneficial for several reasons including  knowing how to follow individuals after completing their treatment, and understand if other family members could be at risk for cancer and allow them to undergo genetic testing.   We reviewed the characteristics, features and inheritance patterns of hereditary cancer syndromes. We also discussed genetic testing, including the appropriate family members to test, the process of testing, insurance coverage and turn-around-time for results. We discussed the implications of a negative, positive and/or variant of uncertain significant result. Ronald [name] does not technically meet criteria for genetic testing, but we still offered it based on the large amount of polyps he has had and potential Ashkenazi Jewish ancestry. We recommended Ronald [name] pursue genetic testing for the [facility] Multi-Cancer gene panel.   The Multi-Cancer Panel offered by [facility] includes sequencing and/or deletion duplication testing of the following 85 genes: AIP, ALK, APC, ATM, AXIN2,BAP1,  BARD1, BLM, BMPR1A, BRCA1, BRCA2, BRIP1, CASR, CDC73, CDH1, CDK4, CDKN1B, CDKN1C, CDKN2A (p14ARF), CDKN2A (p16INK4a), CEBPA, CHEK2, CTNNA1, DICER1, DIS3L2, EGFR (c.2369C>T, p.Thr790Met variant only), EPCAM (Deletion/duplication testing only), FH, FLCN, GATA2, GPC3, GREM1 (Promoter region deletion/duplication testing only), HOXB13 (c.251G>A, p.Gly84Glu), HRAS, KIT, MAX, MEN1, MET, MITF (c.952G>A, p.Glu318Lys variant only), MLH1, MSH2, MSH3, MSH6, MUTYH, NBN, NF1, NF2, NTHL1, PALB2, PDGFRA, PHOX2B, PMS2, POLD1, POLE, POT1, PRKAR1A,  PTCH1, PTEN, RAD50, RAD51C, RAD51D, RB1, RECQL4, RET, RNF43, RUNX1, SDHAF2, SDHA (sequence changes only), SDHB, SDHC, SDHD, SMAD4, SMARCA4, SMARCB1, SMARCE1, STK11, SUFU, TERC, TERT, TMEM127, TP53, TSC1, TSC2, VHL, WRN and WT1.   Based on Ronald [name] personal history of cancer, he does not meet medical criteria for genetic testing. Despite that he meets criteria, he may still have an out of pocket cost.   PLAN: After considering the risks, benefits, and limitations, Ronald [name]  provided informed consent to pursue genetic testing. He will have his blood drawn on 12/22 and the blood sample will be sent to [facility] for analysis of the Multi-Cancer Panel. Results should be available within approximately 2-3 weeks' time, at which point they will be disclosed by telephone to Ronald [name], as will any additional recommendations warranted by these results. Ronald [name] will receive a summary of his genetic counseling visit and a copy of his results once available. This information will also be available in Epic.   Ronald [name] questions were answered to his satisfaction today. Our contact information was provided should additional questions or concerns arise. Thank you for the referral and allowing [place] to share in the care of your patient.   Faith Rogue, MS, [facility] Genetic Counselor [email or web address] Phone: [phone]  The patient was seen for a total of 35 minutes in virtual genetic counseling. His daughter Ronald [name] was also present. Drs. Magrinat, Lindi Adie and/or Burr Medico were available for discussion regarding this case.   _______________________________________________________________________ For Office Staff:  Number of people involved in session: 2 Was an Intern/ student involved with case: no

## 2019-03-01 ENCOUNTER — Inpatient Hospital Stay: Payer: PPO

## 2019-03-01 ENCOUNTER — Other Ambulatory Visit: Payer: Self-pay

## 2019-03-01 DIAGNOSIS — D509 Iron deficiency anemia, unspecified: Secondary | ICD-10-CM

## 2019-03-02 DIAGNOSIS — C49A2 Gastrointestinal stromal tumor of stomach: Secondary | ICD-10-CM | POA: Diagnosis not present

## 2019-03-02 DIAGNOSIS — K317 Polyp of stomach and duodenum: Secondary | ICD-10-CM | POA: Diagnosis not present

## 2019-03-02 DIAGNOSIS — Z86010 Personal history of colonic polyps: Secondary | ICD-10-CM | POA: Diagnosis not present

## 2019-03-12 ENCOUNTER — Other Ambulatory Visit: Payer: Self-pay | Admitting: Internal Medicine

## 2019-03-14 ENCOUNTER — Ambulatory Visit: Payer: Self-pay | Admitting: Licensed Clinical Social Worker

## 2019-03-14 ENCOUNTER — Telehealth: Payer: Self-pay | Admitting: Licensed Clinical Social Worker

## 2019-03-14 ENCOUNTER — Encounter: Payer: Self-pay | Admitting: Licensed Clinical Social Worker

## 2019-03-14 DIAGNOSIS — Z1379 Encounter for other screening for genetic and chromosomal anomalies: Secondary | ICD-10-CM

## 2019-03-14 DIAGNOSIS — K317 Polyp of stomach and duodenum: Secondary | ICD-10-CM

## 2019-03-14 DIAGNOSIS — C49A2 Gastrointestinal stromal tumor of stomach: Secondary | ICD-10-CM

## 2019-03-14 NOTE — Progress Notes (Signed)
HPI:  [name] was previously seen in the [facility] due to a personal history of gastric polyps and cancer and concerns regarding a hereditary predisposition to cancer. Please refer to our prior cancer genetics clinic note for more information regarding our discussion, assessment and recommendations, at the time. [name] recent genetic test results were disclosed to him, as were recommendations warranted by these results. These results and recommendations are discussed in more detail below.  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 Right renal stones, in somewhat of a staghorn configuration. No hydronephrosis. Markedly enlarged prostate. No additional findings to explain the patient's symptoms.  2. Cholelithiasis.  3. Left adrenal adenoma 4. Aortic athrosclerosis. Caronary Artery calcification.    01/31/2019 Procedure    EUS by Dr. Rush Landmark 01/31/19  EGD Impression: - No gross lesions in esophagus. - Multiple gastric polyps. 3 were resected and retrieved (2 via Mucosal resection & 1 via Hot snare polypectomy). Clips (MR conditional) were placed. - A single submucosal papule (nodule) found in the stomach. - Endoclips were found in the stomach (previously placed). - A single duodenal polyp. Resected and retrieved. Clip (MR conditional) was placed. EUS Impression: - An intramural (subepithelial) lesion was found in the antrum of the stomach. The lesion appeared to originate from within the muscularis propria (Layer 4). Tissue was  obtained from this exam, and results are pending. However, the endosonographic appearance is suspicious for a stromal cell (smooth muscle) neoplasm, of indeterminate biological behavior. Fine needle biopsy performed. - Wall thickening was seen in the body of the stomach. The thickening appeared to be primarily within the luminal interface/superficial mucosa (Layer 1) and deep mucosa (Layer 2). - Multiple stones were visualized endosonographically in the gallbladder.   01/31/2019 Initial Biopsy   FINAL MICROSCOPIC DIAGNOSIS: 01/31/19  A. POSTERIOR WALL OF ANTRUM NODULE, FINE NEEDLE ASPIRATION:  - Consistent with gastrointestinal stromal tumor.   FINAL MICROSCOPIC DIAGNOSIS:   A. DUODENUM, POLYPECTOMY:  - Hyperplastic polyp.   B. STOMACH, ANTERIOR WALL, POLYPECTOMY:  - Hyperplastic and fundic gland polyps.     02/21/2019 Initial Diagnosis   Gastrointestinal stromal tumor (GIST) of stomach ([place])   02/21/2019 Cancer Staging   Staging form: Gastrointestinal Stromal Tumor - Gastric and Omental GIST, AJCC 8th Edition - Clinical stage from 02/21/2019: Stage IA (cT2, cN0, cM0, Mitotic Rate: Low) - Signed by Truitt Merle, MD on 02/21/2019     FAMILY HISTORY:  We obtained a detailed, 4-generation family history.  Significant diagnoses are listed below: Family History  Problem Relation Age of Onset  . Heart attack Father 37  . Heart attack Mother   . Pneumonia Sister 6  . Colon cancer Neg Hx   . Prostate cancer Neg Hx   . Diabetes Neg Hx     [name] has 2 daughters and 1 son, no cancers. One of his daughters had a few colon polyps on colonoscopy. [name] had one sister who died at age 97 of pneumonia.  [name]'s mother died at 66, she had a brain tumor but he is  unsure if this was cancerous. Patient had 4 maternal uncles and 1 maternal aunt, he has limited information about them. He does not believe anyone has had cancer, but is unsure. He is unsure if any maternal cousins have  had cancer. Maternal grandmother died in her 50s, grandfather also died in his 80s and possibly had polyps.   [name]'s father died at 64 due to heart problems. Patient has no information about this side of the family, other than a paternal uncle but he does not have information about him. No information about paternal cousins or grandparents.  [name] is unaware of previous family history of genetic testing for hereditary cancer risks. Patient's maternal ancestors are of [place] and [place] descent, and paternal ancestors are of [place] descent. There is reported Jewish ancestry on both sides of his family but he is unsure if Ashkenazi Jewish. There is no known consanguinity.  GENETIC TEST RESULTS: Genetic testing reported out on 03/10/2019 through the [facility] Multi- cancer panel found no pathogenic mutations.   The Multi-Cancer Panel offered by [facility] includes sequencing and/or deletion duplication testing of the following 85 genes: AIP, ALK, APC, ATM, AXIN2,BAP1,  BARD1, BLM, BMPR1A, BRCA1, BRCA2, BRIP1, CASR, CDC73, CDH1, CDK4, CDKN1B, CDKN1C, CDKN2A (p14ARF), CDKN2A (p16INK4a), CEBPA, CHEK2, CTNNA1, DICER1, DIS3L2, EGFR (c.2369C>T, p.Thr790Met variant only), EPCAM (Deletion/duplication testing only), FH, FLCN, GATA2, GPC3, GREM1 (Promoter region deletion/duplication testing only), HOXB13 (c.251G>A, p.Gly84Glu), HRAS, KIT, MAX, MEN1, MET, MITF (c.952G>A, p.Glu318Lys variant only), MLH1, MSH2, MSH3, MSH6, MUTYH, NBN, NF1, NF2, NTHL1, PALB2, PDGFRA, PHOX2B, PMS2, POLD1, POLE, POT1, PRKAR1A, PTCH1, PTEN, RAD50, RAD51C, RAD51D, RB1, RECQL4, RET, RNF43, RUNX1, SDHAF2, SDHA (sequence changes only), SDHB, SDHC, SDHD, SMAD4, SMARCA4, SMARCB1, SMARCE1, STK11, SUFU, TERC, TERT, TMEM127, TP53, TSC1, TSC2, VHL, WRN and WT1.   The test report has been scanned into EPIC and is located under the Molecular Pathology section of the Results Review tab.  A portion of the result report is included below for  reference.     We discussed with [name] that because current genetic testing is not perfect, it is possible there may be a gene mutation in one of these genes that current testing cannot detect, but that chance is small.  We also discussed, that there could be another gene that has not yet been discovered, or that we have not yet tested, that is responsible for the cancer diagnoses in the family. It is also possible there is a hereditary cause for the cancer in the family that [name] did not inherit and therefore was not identified in his testing.  Therefore, it is important to remain in touch with cancer genetics in the future so that we can continue to offer [name] the most up to date genetic testing.   Genetic testing did identify a variant of uncertain significance (VUS) was identified in the HOXB13 gene called c.650G>A.  At this time, it is unknown if this variant is associated with increased cancer risk or if this is a normal finding, but most variants such as this get reclassified to being inconsequential. It should not be used to make medical management decisions. With time, we suspect the lab will determine the significance of this variant, if any. If we do learn more about it, we will try to contact [name] to discuss it further. However, it is important to stay in touch with [place] periodically and keep the address and phone number up to date.  ADDITIONAL GENETIC TESTING: We discussed with Mr.  Ronald [name] that his genetic testing was fairly extensive.  If there are genes identified to increase cancer risk that can be analyzed in the future, we would be happy to discuss and coordinate this testing at that time.    CANCER SCREENING RECOMMENDATIONS: [name] test result is considered negative (normal).  This means that we have not identified a hereditary cause for his personal history of gastric polyps and GIST at this time.   While reassuring, this does not definitively rule out a hereditary  predisposition to cancer. It is still possible that there could be genetic mutations that are undetectable by current technology. There could be genetic mutations in genes that have not been tested or identified to increase cancer risk.  Therefore, it is recommended he continue to follow the cancer management and screening guidelines provided by his oncology and primary healthcare provider.   An individual's cancer risk and medical management are not determined by genetic test results alone. Overall cancer risk assessment incorporates additional factors, including personal medical history, family history, and any available genetic information that may result in a personalized plan for cancer prevention and surveillance.  RECOMMENDATIONS FOR FAMILY MEMBERS:  Relatives in this family might be at some increased risk of developing cancer, over the general population risk, simply due to the family history of cancer and polyps.  We recommended male relatives in this family have a yearly mammogram beginning at age 61, or 22 years younger than the earliest onset of cancer, an annual clinical breast exam, and perform monthly breast self-exams. Male relatives in this family should also have a gynecological exam as recommended by their primary provider. All family members should have a colonoscopy by age 54, or as directed by their physicians.  FOLLOW-UP: Lastly, we discussed with [name] that cancer genetics is a rapidly advancing field and it is possible that new genetic tests will be appropriate for him and/or his family members in the future. We encouraged him to remain in contact with cancer genetics on an annual basis so we can update his personal and family histories and let him know of advances in cancer genetics that may benefit this family.   Our contact number was provided. Mr. [name] questions were answered to his satisfaction, and he knows he is welcome to call us at anytime with additional  questions or concerns.   Faith Rogue, MS, [facility] Genetic Counselor [email or web address] Phone: [phone]

## 2019-03-14 NOTE — Telephone Encounter (Signed)
Revealed negative genetic testing.  Revealed that a VUS in HOXB13 was identified.  We discussed that we do not know why he has had a GIST or gastric polyps or why there is cancer in the family. It could be due to a different gene that we are not testing, or something our current technology cannot pick up.  It will be important for him to keep in contact with genetics to learn if additional testing may be needed in the future.

## 2019-03-16 ENCOUNTER — Encounter: Payer: Self-pay | Admitting: Internal Medicine

## 2019-03-28 ENCOUNTER — Other Ambulatory Visit: Payer: Self-pay | Admitting: Internal Medicine

## 2019-03-28 NOTE — Telephone Encounter (Signed)
Eliquis 2.5mg  refill request received, pt is 85yrs old, weight-92.8kg, Crea-1.68 on 09/01/2018, Diagnosis-Afib, and last seen by Dr. Caryl Comes on 11/09/2018. Dose is appropriate based on dosing criteria. Will send in refill to requested pharmacy.

## 2019-04-06 ENCOUNTER — Encounter: Payer: Self-pay | Admitting: Internal Medicine

## 2019-04-06 ENCOUNTER — Ambulatory Visit (INDEPENDENT_AMBULATORY_CARE_PROVIDER_SITE_OTHER): Payer: PPO | Admitting: Internal Medicine

## 2019-04-06 ENCOUNTER — Other Ambulatory Visit: Payer: Self-pay

## 2019-04-06 VITALS — BP 140/75 | HR 56 | Temp 97.0°F | Resp 16 | Ht 68.0 in | Wt 203.5 lb

## 2019-04-06 DIAGNOSIS — C49A2 Gastrointestinal stromal tumor of stomach: Secondary | ICD-10-CM | POA: Diagnosis not present

## 2019-04-06 DIAGNOSIS — R911 Solitary pulmonary nodule: Secondary | ICD-10-CM | POA: Diagnosis not present

## 2019-04-06 DIAGNOSIS — I483 Typical atrial flutter: Secondary | ICD-10-CM

## 2019-04-06 DIAGNOSIS — E559 Vitamin D deficiency, unspecified: Secondary | ICD-10-CM

## 2019-04-06 DIAGNOSIS — E1169 Type 2 diabetes mellitus with other specified complication: Secondary | ICD-10-CM | POA: Diagnosis not present

## 2019-04-06 DIAGNOSIS — I10 Essential (primary) hypertension: Secondary | ICD-10-CM | POA: Diagnosis not present

## 2019-04-06 DIAGNOSIS — Z0000 Encounter for general adult medical examination without abnormal findings: Secondary | ICD-10-CM

## 2019-04-06 LAB — VITAMIN D 25 HYDROXY (VIT D DEFICIENCY, FRACTURES): VITD: 28.8 ng/mL — ABNORMAL LOW (ref 30.00–100.00)

## 2019-04-06 LAB — COMPREHENSIVE METABOLIC PANEL
ALT: 15 U/L (ref 0–53)
AST: 15 U/L (ref 0–37)
Albumin: 3.6 g/dL (ref 3.5–5.2)
Alkaline Phosphatase: 63 U/L (ref 39–117)
BUN: 29 mg/dL — ABNORMAL HIGH (ref 6–23)
CO2: 32 mEq/L (ref 19–32)
Calcium: 8.8 mg/dL (ref 8.4–10.5)
Chloride: 105 mEq/L (ref 96–112)
Creatinine, Ser: 1.31 mg/dL (ref 0.40–1.50)
GFR: 52.3 mL/min — ABNORMAL LOW (ref >60.00)
Glucose, Bld: 105 mg/dL — ABNORMAL HIGH (ref 70–99)
Potassium: 4.7 mEq/L (ref 3.5–5.1)
Sodium: 142 mEq/L (ref 135–145)
Total Bilirubin: 0.5 mg/dL (ref 0.2–1.2)
Total Protein: 6 g/dL (ref 6.0–8.3)

## 2019-04-06 LAB — LIPID PANEL
Cholesterol: 162 mg/dL (ref 0–200)
HDL: 40.1 mg/dL (ref >39.00)
LDL Cholesterol: 88 mg/dL (ref 0–99)
NonHDL: 121.55
Total CHOL/HDL Ratio: 4
Triglycerides: 169 mg/dL — ABNORMAL HIGH (ref 0.0–149.0)
VLDL: 33.8 mg/dL (ref 0.0–40.0)

## 2019-04-06 LAB — TSH: TSH: 1.66 u[IU]/mL (ref 0.35–4.50)

## 2019-04-06 LAB — HEMOGLOBIN A1C: Hgb A1c MFr Bld: 5.9 % (ref 4.6–6.5)

## 2019-04-06 NOTE — Patient Instructions (Addendum)
Please schedule Medicare Wellness with Glenard Haring.    GO TO THE LAB : Get the blood work     GO TO THE FRONT DESK Schedule your next appointment   for checkup in 6 months    Diabetes Mellitus and [place] care is an important part of your health, especially when you have diabetes. Diabetes may cause you to have problems because of poor blood flow (circulation) to your feet and legs, which can cause your skin to:  Become thinner and drier.  Break more easily.  Heal more slowly.  Peel and crack. You may also have nerve damage (neuropathy) in your legs and feet, causing decreased feeling in them. This means that you may not notice minor injuries to your feet that could lead to more serious problems. Noticing and addressing any potential problems early is the best way to prevent future foot problems. How to care for your feet Foot hygiene  Wash your feet daily with warm water and mild soap. Do not use hot water. Then, pat your feet and the areas between your toes until they are completely dry. Do not soak your feet as this can dry your skin.  Trim your toenails straight across. Do not dig under them or around the cuticle. File the edges of your nails with an emery board or nail file.  Apply a moisturizing lotion or petroleum jelly to the skin on your feet and to dry, brittle toenails. Use lotion that does not contain alcohol and is unscented. Do not apply lotion between your toes. Shoes and socks  Wear clean socks or stockings every day. Make sure they are not too tight. Do not wear knee-high stockings since they may decrease blood flow to your legs.  Wear shoes that fit properly and have enough cushioning. Always look in your shoes before you put them on to be sure there are no objects inside.  To break in new shoes, wear them for just a few hours a day. This prevents injuries on your feet. Wounds, scrapes, corns, and calluses  Check your feet daily for blisters, cuts, bruises,  sores, and redness. If you cannot see the bottom of your feet, use a mirror or ask someone for help.  Do not cut corns or calluses or try to remove them with medicine.  If you find a minor scrape, cut, or break in the skin on your feet, keep it and the skin around it clean and dry. You may clean these areas with mild soap and water. Do not clean the area with peroxide, alcohol, or iodine.  If you have a wound, scrape, corn, or callus on your foot, look at it several times a day to make sure it is healing and not infected. Check for: ? Redness, swelling, or pain. ? Fluid or blood. ? Warmth. ? Pus or a bad smell. General instructions  Do not cross your legs. This may decrease blood flow to your feet.  Do not use heating pads or hot water bottles on your feet. They may burn your skin. If you have lost feeling in your feet or legs, you may not know this is happening until it is too late.  Protect your feet from hot and cold by wearing shoes, such as at the beach or on hot pavement.  Schedule a complete foot exam at least once a year (annually) or more often if you have foot problems. If you have foot problems, report any cuts, sores, or bruises to your  health care provider immediately. Contact a health care provider if:  You have a medical condition that increases your risk of infection and you have any cuts, sores, or bruises on your feet.  You have an injury that is not healing.  You have redness on your legs or feet.  You feel burning or tingling in your legs or feet.  You have pain or cramps in your legs and feet.  Your legs or feet are numb.  Your feet always feel cold.  You have pain around a toenail. Get help right away if:  You have a wound, scrape, corn, or callus on your foot and: ? You have pain, swelling, or redness that gets worse. ? You have fluid or blood coming from the wound, scrape, corn, or callus. ? Your wound, scrape, corn, or callus feels warm to the  touch. ? You have pus or a bad smell coming from the wound, scrape, corn, or callus. ? You have a fever. ? You have a red line going up your leg. Summary  Check your feet every day for cuts, sores, red spots, swelling, and blisters.  Moisturize feet and legs daily.  Wear shoes that fit properly and have enough cushioning.  If you have foot problems, report any cuts, sores, or bruises to your health care provider immediately.  Schedule a complete foot exam at least once a year (annually) or more often if you have foot problems. This information is not intended to replace advice given to you by your health care provider. Make sure you discuss any questions you have with your health care provider. Document Revised: 11/17/2018 Document Reviewed: 03/28/2016 Elsevier Patient Education  [place].

## 2019-04-06 NOTE — Progress Notes (Signed)
Pre visit review using our clinic review tool, if applicable. No additional management support is needed unless otherwise documented below in the visit note. 

## 2019-04-06 NOTE — Progress Notes (Signed)
Subjective:    Patient ID: Ronald [name], male    DOB: 20-Jun-1934, 85 y.o.   MRN: [id number]  DOS:  04/06/2019 Type of visit - description: CPX In general feels well.  In addition to CPX, we review his chronic medical problems, I review GI work-up and the oncology note since the last visit. We also review abnormal CT chest.   Review of Systems No LUTS other than the slow flow.  Other than above, a 14 point review of systems is negative      Past Medical History:  Diagnosis Date  . Arthritis    one finger  . BPH (benign prostatic hypertrophy)    f/u @ Biopsy, s/p bx 2009 apro (-)  . Cataract   . Colon polyp   . Complication of anesthesia    violent upon waking up (only 1 time)   . Diverticulosis   . Dysrhythmia    A-flutter/Afib  . Early cataract    left  . Edema leg    asymetric , L > R , [place] neg for DVT 09-2010  . Gastric polyps   . GERD (gastroesophageal reflux disease)   . Hemorrhoids   . History of kidney stones   . Hypercalcemia    parathyroidectomy 04/2008  . Hyperlipidemia   . Hypertension   . Iron deficiency anemia   . Polyp, stomach   . Pre-diabetes   . Wears dentures   . Wears glasses     Past Surgical History:  Procedure Laterality Date  . A-FLUTTER ABLATION N/A 08/12/2017   Procedure: A-FLUTTER ABLATION;  Surgeon: Deboraha Sprang, MD;  Location: [facility];  Service: Cardiovascular;  Laterality: N/A;  . APPENDECTOMY    . BIOPSY  09/20/2018   Procedure: BIOPSY;  Surgeon: Rush Landmark Telford Nab., MD;  Location: [facility];  Service: Gastroenterology;;  . CATARACT EXTRACTION Right   . COLONOSCOPY  12/16/2011   Procedure: COLONOSCOPY;  Surgeon: Inda Castle, MD;  Location: [facility];  Service: Endoscopy;  Laterality: N/A;  . ENDOSCOPIC MUCOSAL RESECTION N/A 09/20/2018   Procedure: ENDOSCOPIC MUCOSAL RESECTION;  Surgeon: Rush Landmark Telford Nab., MD;  Location: [facility];  Service: Gastroenterology;  Laterality: N/A;  . ENDOSCOPIC  MUCOSAL RESECTION  01/31/2019   Procedure: ENDOSCOPIC MUCOSAL RESECTION;  Surgeon: Rush Landmark Telford Nab., MD;  Location: [facility];  Service: Gastroenterology;;  . ESOPHAGOGASTRODUODENOSCOPY N/A 01/17/2013   Procedure: ESOPHAGOGASTRODUODENOSCOPY (EGD);  Surgeon: Inda Castle, MD;  Location: [facility];  Service: Endoscopy;  Laterality: N/A;  . ESOPHAGOGASTRODUODENOSCOPY (EGD) WITH PROPOFOL N/A 09/20/2018   Procedure: ESOPHAGOGASTRODUODENOSCOPY (EGD) WITH PROPOFOL;  Surgeon: Rush Landmark Telford Nab., MD;  Location: [facility];  Service: Gastroenterology;  Laterality: N/A;  . ESOPHAGOGASTRODUODENOSCOPY (EGD) WITH PROPOFOL N/A 01/31/2019   Procedure: ESOPHAGOGASTRODUODENOSCOPY (EGD) WITH PROPOFOL;  Surgeon: Rush Landmark Telford Nab., MD;  Location: [place];  Service: Gastroenterology;  Laterality: N/A;  . EUS N/A 01/31/2019   Procedure: UPPER ENDOSCOPIC ULTRASOUND (EUS) RADIAL;  Surgeon: Rush Landmark Telford Nab., MD;  Location: [place];  Service: Gastroenterology;  Laterality: N/A;  . EXTERNAL EAR SURGERY  01/2016   BCC removal  . EYE SURGERY  infant   "lazy eye"  . FINE NEEDLE ASPIRATION  01/31/2019   Procedure: FINE NEEDLE ASPIRATION (FNA) LINEAR;  Surgeon: Irving Copas., MD;  Location: [facility];  Service: Gastroenterology;;  . Otho Darner SIGMOIDOSCOPY  02/09/2012   Procedure: FLEXIBLE SIGMOIDOSCOPY;  Surgeon: Inda Castle, MD;  Location: [facility];  Service: Endoscopy;  Laterality: N/A;  . HEMORRHOID  BANDING  02/09/2012   Procedure: HEMORRHOID BANDING;  Surgeon: Inda Castle, MD;  Location: [facility];  Service: Endoscopy;  Laterality: N/A;  . HEMOSTASIS CLIP PLACEMENT  09/20/2018   Procedure: HEMOSTASIS CLIP PLACEMENT;  Surgeon: Irving Copas., MD;  Location: [place];  Service: Gastroenterology;;  . HEMOSTASIS CLIP PLACEMENT  01/31/2019   Procedure: HEMOSTASIS CLIP PLACEMENT;  Surgeon: Irving Copas., MD;  Location: [place];   Service: Gastroenterology;;  . HOT HEMOSTASIS N/A 01/17/2013   Procedure: HOT HEMOSTASIS (ARGON PLASMA COAGULATION/BICAP);  Surgeon: Inda Castle, MD;  Location: [facility];  Service: Endoscopy;  Laterality: N/A;  . KIDNEY STONE SURGERY    . MULTIPLE TOOTH EXTRACTIONS    . PARATHYROIDECTOMY  04/2008  . POLYPECTOMY  09/20/2018   Procedure: POLYPECTOMY;  Surgeon: Mansouraty, Telford Nab., MD;  Location: [place];  Service: Gastroenterology;;  . POLYPECTOMY  01/31/2019   Procedure: POLYPECTOMY;  Surgeon: Irving Copas., MD;  Location: [place];  Service: Gastroenterology;;  . Clide Deutscher  09/20/2018   Procedure: Clide Deutscher;  Surgeon: Irving Copas., MD;  Location: [place];  Service: Gastroenterology;;  . Clide Deutscher  01/31/2019   Procedure: Clide Deutscher;  Surgeon: Irving Copas., MD;  Location: [facility] ENDOSCOPY;  Service: Gastroenterology;;  . TONSILLECTOMY     Family History  Problem Relation Age of Onset  . Heart attack Father 76  . Heart attack Mother   . Pneumonia Sister 6  . Colon cancer Neg Hx   . Prostate cancer Neg Hx   . Diabetes Neg Hx         Objective:   Physical Exam BP 140/75 (BP Location: Left Arm, Patient Position: Sitting, Cuff Size: Small)   Pulse (!) 56   Temp (!) 97 F (36.1 C) (Temporal)   Resp 16   Ht 5\' 8"  (1.727 m)   Wt 203 lb 8 oz (92.3 kg)   SpO2 97%   BMI 30.94 kg/m  General: Well developed, NAD, BMI noted Neck: No  thyromegaly  HEENT:  Normocephalic . Face symmetric, atraumatic Lungs:  CTA B Normal respiratory effort, no intercostal retractions, no accessory muscle use. Heart: Bradycardic,  no murmur.  + Pretibial edema L>>R at baseline Abdomen:  Not distended, soft, non-tender. No rebound or rigidity. Foot exam: Good pulses, warm extremities, very mild decreased pinprick examination distally worse on the left. Skin: Exposed areas without rash. Not pale. Not jaundice Neurologic:  alert &  oriented X3.  Speech normal, gait appropriate for age and unassisted Strength symmetric and appropriate for age.  Psych: Cognition and judgment appear intact.  Cooperative with normal attention span and concentration.  Behavior appropriate. No anxious or depressed appearing.     Assessment      Assessment   DM HTN Hyperlipidemia Osteopenia T score -2.0 (2010), T score -1.6 (12-2014); T score -1.3 (03-2018). LEG Edema,L>R , chronic, [place] neg DVT 2012 CV: Atrial tachycardia/flutter DX 02/2016 GU: --BPH  --ED --Elevated PSA, s/p multiple bx (last 2009?neg), last OV w/ urology 2014, last  PSA 2014 ~ 13, declines further eval as off 02-2016 Hypercalcemia, parathyroidectomy 2010 Renal stones, nephrostomy 2009 at [facility]:  --Anemia: EGD 12-2012  (Bx: H pylory neg) [id number] gastric polyposis, Bx H Pylory +, was rec treatment Anemia felt to be d/t chronic bleed from  gastric polyp H Pylori + 11-2014, treated, f/u breath test (-)  Gastric GIST, per endoscopy 10-20 20 --GERD --cscope 12-2011 >>> multiple hyperplastic  Polyps, tics  --12-2011 Banding internal hemorrhoids 4 --02-2012:  Flex sig d/t discomfort  BCC , removed from L ear CT abd 06/2017 @ urology: RML 1.7 cm nodule, gallbladder stone, hepatic steatosis.  PLAN Here for CPX DM: Diet controlled, check A1c. Neuropathy: Insensitive type, denies paresthesias likely due to to diabetes, feet care discussed.  See physical exam. HTN: On Lotensin, Lasix, metoprolol, Procardia, no ambulatory BPs but BP today's satisfactory.  No change Hyperlipidemia:Checking labs, continue simvastatin   Atrial flutter: Anticoagulated, on flecainide, asymptomatic. Gastric GIST, cT2N0M0, unknown grade : Since the last visit,a Upper EUS was pursued, biopsy showed with gastric GIST, so oncology 02/21/2019, options moving forward were discussed, he elected surveillance by GI.  Subsequently was evaluated by the genetics department. RML pulmonary nodule  on CT 06-2017: We reviewed the report, we agreed to check a lung CT. Vitamin D deficiency: Recheck labs.  On vitamin D OTC 1000 units daily RTC 6 months  In addition to CPX, we review multiple other issues.  See above. This visit occurred during the SARS-CoV-2 public health emergency.  Safety protocols were in place, including screening questions prior to the visit, additional usage of staff PPE, and extensive cleaning of exam room while observing appropriate contact time as indicated for disinfecting solutions.

## 2019-04-07 ENCOUNTER — Encounter: Payer: Self-pay | Admitting: Internal Medicine

## 2019-04-07 ENCOUNTER — Other Ambulatory Visit: Payer: Self-pay

## 2019-04-07 DIAGNOSIS — R911 Solitary pulmonary nodule: Secondary | ICD-10-CM

## 2019-04-07 MED ORDER — VITAMIN D (ERGOCALCIFEROL) 1.25 MG (50000 UNIT) PO CAPS: 50000.0000 [IU] | ORAL_CAPSULE | ORAL | 0 refills | Status: DC

## 2019-04-07 NOTE — Assessment & Plan Note (Signed)
-   Td 09-2015 - Pneumonia shot: declined again -s/p zostavax ,  -shingrix discussed  before  - Had a flu shot -CCS: Cscope  03-2006, Bx adenomatous polyp (Dr Sharlett Iles) [place] again 12-2011--- no further scopes needed  -Elevated  PSA: Sees urology regularly - has a  healthy diet, will not exercise (personal decision)  -Labs: CMP, A1c, FLP, TSH, vitamin D

## 2019-04-07 NOTE — Assessment & Plan Note (Signed)
Here for CPX DM: Diet controlled, check A1c. Neuropathy: Insensitive type, denies paresthesias likely due to to diabetes, feet care discussed.  See physical exam. HTN: On Lotensin, Lasix, metoprolol, Procardia, no ambulatory BPs but BP today's satisfactory.  No change Hyperlipidemia:Checking labs, continue simvastatin   Atrial flutter: Anticoagulated, on flecainide, asymptomatic. Gastric GIST, cT2N0M0, unknown grade : Since the last visit,a Upper EUS was pursued, biopsy showed with gastric GIST, so oncology 02/21/2019, options moving forward were discussed, he elected surveillance by GI.  Subsequently was evaluated by the genetics department. RML pulmonary nodule on CT 06-2017: We reviewed the report, we agreed to check a lung CT. Vitamin D deficiency: Recheck labs.  On vitamin D OTC 1000 units daily RTC 6 months

## 2019-04-09 ENCOUNTER — Ambulatory Visit: Payer: PPO

## 2019-04-11 ENCOUNTER — Ambulatory Visit (HOSPITAL_BASED_OUTPATIENT_CLINIC_OR_DEPARTMENT_OTHER)
Admission: RE | Admit: 2019-04-11 | Discharge: 2019-04-11 | Disposition: A | Discharge status: Home / Self Care | Payer: PPO | Source: Ambulatory Visit | Attending: Internal Medicine | Admitting: Internal Medicine

## 2019-04-11 ENCOUNTER — Other Ambulatory Visit: Payer: Self-pay

## 2019-04-11 DIAGNOSIS — J8410 Pulmonary fibrosis, unspecified: Secondary | ICD-10-CM | POA: Diagnosis not present

## 2019-04-11 DIAGNOSIS — R911 Solitary pulmonary nodule: Secondary | ICD-10-CM | POA: Insufficient documentation

## 2019-04-11 DIAGNOSIS — J181 Lobar pneumonia, unspecified organism: Secondary | ICD-10-CM | POA: Diagnosis not present

## 2019-04-13 ENCOUNTER — Encounter: Payer: Self-pay | Admitting: Internal Medicine

## 2019-04-17 ENCOUNTER — Ambulatory Visit: Payer: PPO | Attending: Internal Medicine

## 2019-04-17 DIAGNOSIS — Z23 Encounter for immunization: Secondary | ICD-10-CM

## 2019-04-17 NOTE — Progress Notes (Signed)
   Covid-19 Vaccination Clinic  Name:  Ronald [name]    MRN: [id number] DOB: 03-18-1934  04/17/2019  [name] was observed post Covid-19 immunization for 15 minutes without incidence. He was provided with Vaccine Information Sheet and instruction to access the V-Safe system.   [name] was instructed to call 911 with any severe reactions post vaccine: Marland Kitchen Difficulty breathing  . Swelling of your face and throat  . A fast heartbeat  . A bad rash all over your body  . Dizziness and weakness    Immunizations Administered    Name Date Dose VIS Date Route   Pfizer COVID-19 Vaccine 04/17/2019  8:56 AM 0.3 mL 02/18/2019 Intramuscular   Manufacturer: [place]   Lot: [id number]   [place]: [id number]

## 2019-04-20 ENCOUNTER — Ambulatory Visit: Payer: PPO

## 2019-04-26 ENCOUNTER — Telehealth: Payer: Self-pay | Admitting: Internal Medicine

## 2019-04-26 NOTE — Chronic Care Management (AMB) (Signed)
  Chronic Care Management   Note  04/26/2019 Name: Ronald [name] MRN: [id number] DOB: [birth year]  Ronald [name] is a 85 y.o. year old male who is a primary care patient of Larose Kells, Alda Berthold, MD. I reached out to Ronald [name] by phone today in response to a referral sent by [name]'s PCP, Colon Branch, MD.   [name] was given information about Chronic Care Management services today including:  1. CCM service includes personalized support from designated clinical staff supervised by his physician, including individualized plan of care and coordination with other care providers 2. 24/7 contact phone numbers for assistance for urgent and routine care needs. 3. Service will only be billed when office clinical staff spend 20 minutes or more in a month to coordinate care. 4. Only one practitioner may furnish and bill the service in a calendar month. 5. The patient may stop CCM services at any time (effective at the end of the month) by phone call to the office staff. 6. The patient will be responsible for cost sharing (co-pay) of up to 20% of the service fee (after annual deductible is met).  Patient did not agree to enrollment in care management services and does not wish to consider at this time.  Follow up plan:   Raynicia Dukes [employer] Scheduler

## 2019-05-09 ENCOUNTER — Other Ambulatory Visit: Payer: Self-pay | Admitting: Internal Medicine

## 2019-05-11 ENCOUNTER — Ambulatory Visit: Payer: PPO | Attending: Internal Medicine

## 2019-05-11 DIAGNOSIS — Z23 Encounter for immunization: Secondary | ICD-10-CM | POA: Insufficient documentation

## 2019-05-11 NOTE — Progress Notes (Signed)
   Covid-19 Vaccination Clinic  Name:  Ronald [name]    MRN: [id number] DOB: 1934/10/16  05/11/2019  [name] was observed post Covid-19 immunization for 15 minutes without incident. He was provided with Vaccine Information Sheet and instruction to access the V-Safe system.   [name] was instructed to call 911 with any severe reactions post vaccine: Marland Kitchen Difficulty breathing  . Swelling of face and throat  . A fast heartbeat  . A bad rash all over body  . Dizziness and weakness   Immunizations Administered    Name Date Dose VIS Date Route   Pfizer COVID-19 Vaccine 05/11/2019  4:15 PM 0.3 mL 02/18/2019 Intramuscular   Manufacturer: [place]   Lot: [id number]   [place]: [id number]

## 2019-05-21 ENCOUNTER — Other Ambulatory Visit: Payer: Self-pay | Admitting: Internal Medicine

## 2019-05-21 DIAGNOSIS — I10 Essential (primary) hypertension: Secondary | ICD-10-CM

## 2019-05-22 DIAGNOSIS — I4891 Unspecified atrial fibrillation: Secondary | ICD-10-CM | POA: Insufficient documentation

## 2019-05-22 DIAGNOSIS — I4719 Other supraventricular tachycardia: Secondary | ICD-10-CM | POA: Insufficient documentation

## 2019-05-22 DIAGNOSIS — I471 Supraventricular tachycardia: Secondary | ICD-10-CM | POA: Insufficient documentation

## 2019-05-23 ENCOUNTER — Other Ambulatory Visit: Payer: Self-pay

## 2019-05-23 ENCOUNTER — Ambulatory Visit: Payer: PPO | Admitting: Internal Medicine

## 2019-05-23 ENCOUNTER — Encounter: Payer: Self-pay | Admitting: Internal Medicine

## 2019-05-23 VITALS — BP 114/66 | HR 78 | Ht 68.0 in | Wt 207.0 lb

## 2019-05-23 DIAGNOSIS — I4892 Unspecified atrial flutter: Secondary | ICD-10-CM | POA: Diagnosis not present

## 2019-05-23 DIAGNOSIS — I4719 Other supraventricular tachycardia: Secondary | ICD-10-CM

## 2019-05-23 DIAGNOSIS — I4819 Other persistent atrial fibrillation: Secondary | ICD-10-CM

## 2019-05-23 DIAGNOSIS — I471 Supraventricular tachycardia: Secondary | ICD-10-CM | POA: Diagnosis not present

## 2019-05-23 NOTE — Patient Instructions (Signed)
Medication Instructions:  Your physician has recommended you make the following change in your medication:  ** Hold you Nifedipine for 2 weeks and let us know through [place] how your B/P and swelling are.  *If you need a refill on your cardiac medications before your next appointment, please call your pharmacy*   Lab Work: None ordered.   If you have labs (blood work) drawn today and your tests are completely normal, you will receive your results only by: Marland Kitchen MyChart Message (if you have MyChart) OR . A paper copy in the mail If you have any lab test that is abnormal or we need to change your treatment, we will call you to review the results.   Testing/Procedures: None ordered.    Follow-Up: At [facility], you and your health needs are our priority.  As part of our continuing mission to provide you with exceptional heart care, we have created designated Provider Care Teams.  These Care Teams include your primary Cardiologist (physician) and Advanced Practice Providers (APPs -  Physician Assistants and Nurse Practitioners) who all work together to provide you with the care you need, when you need it.  We recommend signing up for the patient portal called "MyChart".  Sign up information is provided on this After Visit Summary.  MyChart is used to connect with patients for Virtual Visits (Telemedicine).  Patients are able to view lab/test results, encounter notes, upcoming appointments, etc.  Non-urgent messages can be sent to your provider as well.   To learn more about what you can do with MyChart, go to [email or web address].    Your next appointment:   12 months with Dr Caryl Comes  The format for your next appointment:   In person  Provider:   Dr Caryl Comes

## 2019-05-23 NOTE — Progress Notes (Addendum)
Patient Care Team: Colon Branch, MD as PCP - General Deboraha Sprang, MD as PCP - Electrophysiology (Cardiology) Clent Jacks, MD as Consulting Physician (Ophthalmology) Ceasar Mons, MD as Consulting Physician (Urology) Mansouraty, Telford Nab., MD as Consulting Physician (Gastroenterology)   HPI  Ronald [name] is a 85 y.o. male Seen in follow-up for recurrent atrial arrhythmias and atrial fibrillation and was treated with flecainide  It was elected because of his CHADS-VASc score  to begin him on anticoagulation.  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  There have been no palpitations, lightheadedness or syncope  No bleeding .     Thromboembolic risk factors ( age -64, HTN--1) for a CHADSVASc Score of 3   Records and Results Reviewed   Past Medical History:  Diagnosis Date  . Arthritis    one finger  . BPH (benign prostatic hypertrophy)    f/u @ Biopsy, s/p bx 2009 apro (-)  . Cataract   . Colon polyp   . Complication of anesthesia    violent upon waking up (only 1 time)   . Diverticulosis   . Dysrhythmia    A-flutter/Afib  . Early cataract    left  . Edema leg    asymetric , L > R , [place] neg for DVT 09-2010  . Gastric polyps   . GERD (gastroesophageal reflux disease)   . Hemorrhoids   . History of kidney stones   . Hypercalcemia    parathyroidectomy 04/2008  . Hyperlipidemia   . Hypertension   . Iron  deficiency anemia   . Polyp, stomach   . Pre-diabetes   . Wears dentures   . Wears glasses     Past Surgical History:  Procedure Laterality Date  . A-FLUTTER ABLATION N/A 08/12/2017   Procedure: A-FLUTTER ABLATION;  Surgeon: Deboraha Sprang, MD;  Location: [place] [facility];  Service: Cardiovascular;  Laterality: N/A;  . APPENDECTOMY    . BIOPSY  09/20/2018   Procedure: BIOPSY;  Surgeon: Rush Landmark Telford Nab., MD;  Location: [place];  Service: Gastroenterology;;  . CATARACT EXTRACTION Right   . COLONOSCOPY  12/16/2011   Procedure: COLONOSCOPY;  Surgeon: Inda Castle, MD;  Location: [facility];  Service: Endoscopy;  Laterality: N/A;  . ENDOSCOPIC MUCOSAL RESECTION N/A 09/20/2018   Procedure: ENDOSCOPIC MUCOSAL RESECTION;  Surgeon: Rush Landmark Telford Nab., MD;  Location: [place];  Service: Gastroenterology;  Laterality: N/A;  . ENDOSCOPIC MUCOSAL RESECTION  01/31/2019   Procedure: ENDOSCOPIC MUCOSAL RESECTION;  Surgeon: Rush Landmark Telford Nab., MD;  Location: [facility];  Service: Gastroenterology;;  . ESOPHAGOGASTRODUODENOSCOPY N/A 01/17/2013   Procedure: ESOPHAGOGASTRODUODENOSCOPY (EGD);  Surgeon: Inda Castle, MD;  Location:  [facility];  Service: Endoscopy;  Laterality: N/A;  . ESOPHAGOGASTRODUODENOSCOPY (EGD) WITH PROPOFOL N/A 09/20/2018   Procedure: ESOPHAGOGASTRODUODENOSCOPY (EGD) WITH PROPOFOL;  Surgeon: Rush Landmark Telford Nab., MD;  Location: [place];  Service: Gastroenterology;  Laterality: N/A;  . ESOPHAGOGASTRODUODENOSCOPY (EGD) WITH PROPOFOL N/A 01/31/2019   Procedure: ESOPHAGOGASTRODUODENOSCOPY (EGD) WITH PROPOFOL;  Surgeon: Rush Landmark Telford Nab., MD;  Location: [place];  Service: Gastroenterology;  Laterality: N/A;  . EUS N/A 01/31/2019   Procedure: UPPER ENDOSCOPIC ULTRASOUND (EUS) RADIAL;  Surgeon: Rush Landmark Telford Nab., MD;  Location: [place];  Service: Gastroenterology;  Laterality: N/A;  . EXTERNAL EAR SURGERY  01/2016   BCC removal   . EYE SURGERY  infant   "lazy eye"  . FINE NEEDLE ASPIRATION  01/31/2019   Procedure: FINE NEEDLE ASPIRATION (FNA) LINEAR;  Surgeon: Irving Copas., MD;  Location: [facility] END[facility]COPY;  Service: Gastroenterology;;  . Otho Darner SIGMOIDOSCOPY  02/09/2012   Procedure: FLEXIBLE SIGMOIDOSCOPY;  Surgeon: Inda Castle, MD;  Location: [facility];  Service: Endoscopy;  Laterality: N/A;  . HEMORRHOID BANDING  02/09/2012   Procedure: HEMORRHOID BANDING;  Surgeon: Inda Castle, MD;  Location: [facility];  Service: Endoscopy;  Laterality: N/A;  . HEMOSTASIS CLIP PLACEMENT  09/20/2018   Procedure: HEMOSTASIS CLIP PLACEMENT;  Surgeon: Irving Copas., MD;  Location: [place];  Service: Gastroenterology;;  . HEMOSTASIS CLIP PLACEMENT  01/31/2019   Procedure: HEMOSTASIS CLIP PLACEMENT;  Surgeon: Irving Copas., MD;  Location: [place];  Service: Gastroenterology;;  . HOT HEMOSTASIS N/A 01/17/2013   Procedure: HOT HEMOSTASIS (ARGON PLASMA COAGULATION/BICAP);  Surgeon: Inda Castle, MD;  Location: [facility];  Service: Endoscopy;  Laterality: N/A;  . KIDNEY STONE SURGERY    . MULTIPLE TOOTH EXTRACTIONS    . PARATHYROIDECTOMY  04/2008  . POLYPECTOMY  09/20/2018   Procedure: POLYPECTOMY;  Surgeon: Mansouraty, Telford Nab., MD;  Location: [place];  Service: Gastroenterology;;  . POLYPECTOMY  01/31/2019   Procedure: POLYPECTOMY;  Surgeon: Irving Copas., MD;  Location: [place];  Service: Gastroenterology;;  . Clide Deutscher  09/20/2018   Procedure: Clide Deutscher;  Surgeon: Irving Copas., MD;  Location: [place];  Service: Gastroenterology;;  . Clide Deutscher  01/31/2019   Procedure: Clide Deutscher;  Surgeon: Irving Copas., MD;  Location: [place];  Service: Gastroenterology;;  . TONSILLECTOMY      Current Outpatient Medications  Medication Sig Dispense Refill  . benazepril (LOTENSIN) 20 MG tablet Take 1 tablet (20 mg total) by  mouth daily. 90 tablet 3  . cholecalciferol (VITAMIN D) 1000 UNITS tablet Take 1,000 Units by mouth every evening.     Marland Kitchen ELIQUIS 2.5 MG TABS tablet Take 1 tablet by mouth twice daily 180 tablet 1  . ferrous sulfate 325 (65 FE) MG tablet Take 325 mg by mouth daily with breakfast.    . flecainide (TAMBOCOR) 100 MG tablet Take 1 tablet (100 mg) by mouth once daily in the evening (Patient taking differently: Take 100 mg by mouth at bedtime. ) 90 tablet 3  . flecainide (TAMBOCOR) 50 MG tablet Take 1 tablet (50 mg) by mouth once daily in the morning (Patient taking differently: Take 50 mg by mouth every morning. ) 90 tablet 3  . furosemide (LASIX) 40 MG tablet Take 1 tablet by mouth once daily 90 tablet 3  . metoprolol succinate (TOPROL-XL) 50 MG 24 hr tablet Take 1 tablet (50 mg total) by mouth daily. 90 tablet 3  . Multiple Vitamin (MULTIVITAMIN WITH MINERALS) TABS tablet Take 1 tablet by  mouth daily at 12 noon.    . Multiple Vitamins-Minerals (PRESERVISION AREDS 2) CAPS Take 1 capsule by mouth every evening.     Marland Kitchen NIFEdipine (PROCARDIA XL/NIFEDICAL XL) 60 MG 24 hr tablet Take 1 tablet (60 mg total) by mouth daily. 90 tablet 1  . omeprazole (PRILOSEC) 40 MG capsule Take 1 capsule (40 mg total) by mouth 2 (two) times daily before a meal. 180 capsule 3  . Saw [place]met[place] 450 MG CAPS Take 450 mg by mouth every evening.    . simvastatin (ZOCOR) 40 MG tablet TAKE 1 TABLET BY MOUTH AT BEDTIME 90 tablet 1  . Vitamin D, Ergocalciferol, (DRISDOL) 1.25 MG (50000 UNIT) CAPS capsule Take 1 capsule (50,000 Units total) by mouth every 7 (seven) days. 12 capsule 0   No current facility-administered medications for this visit.    Allergies  Allergen Reactions  . Cucumber Extract Nausea And Vomiting  . Sulfonamide Derivatives Other (See Comments)    "don't know reaction" per pt      Review of Systems negative except from HPI and PMH  Physical Exam BP 114/66   Pulse 78   Ht 5\' 8"  (1.727 m)   Wt 207 lb (93.9  kg)   SpO2 97%   BMI 31.47 kg/m   Well developed and nourished in no acute distress HENT normal Neck supple with JVP-  flat *  Clear Regular rate and rhythm, 2/6  Murmur Abd-soft with active BS No Clubbing cyanosis 3+ edema Skin-warm and dry A & Oriented  Grossly normal sensory and motor function  ECG sinus 78 20/11/45 Atrial tach nonsustained, repetitively      Assessment and  Plan  Atrial tach and fibrillation-persistent  Atrial flutter s/p ablation  Hypertension  CArdiomyopathy-NICM presumable rate related >>resolved  Chronic edema Continue flecainide.  ECG surveillance parameters are within range.  Significant edema.  I wonder whether it may be aggravated by his nifedipine.  We will hold it for a couple weeks and see if it gets better; half-life is about 7 hours.  Blood pressure is well controlled.  He will check his blood pressure following the 2-week hiatus from the nifedipine and see whether we need to resume it or not.  On Anticoagulation;  No bleeding issues   No intercurrent atrial fibrillation or flutter of which he is aware

## 2019-05-24 NOTE — Addendum Note (Signed)
Addended by: De Burrs on: 05/24/2019 02:19 PM   Modules accepted: Orders

## 2019-06-18 ENCOUNTER — Encounter: Payer: Self-pay | Admitting: Internal Medicine

## 2019-06-20 ENCOUNTER — Encounter: Payer: Self-pay | Admitting: Internal Medicine

## 2019-07-15 DIAGNOSIS — H53001 Unspecified amblyopia, right eye: Secondary | ICD-10-CM | POA: Diagnosis not present

## 2019-07-15 DIAGNOSIS — H2512 Age-related nuclear cataract, left eye: Secondary | ICD-10-CM | POA: Diagnosis not present

## 2019-07-15 DIAGNOSIS — H40013 Open angle with borderline findings, low risk, bilateral: Secondary | ICD-10-CM | POA: Diagnosis not present

## 2019-07-15 DIAGNOSIS — E119 Type 2 diabetes mellitus without complications: Secondary | ICD-10-CM | POA: Diagnosis not present

## 2019-07-18 ENCOUNTER — Encounter: Payer: Self-pay | Admitting: Internal Medicine

## 2019-07-18 LAB — HM DIABETES EYE EXAM

## 2019-08-31 ENCOUNTER — Other Ambulatory Visit: Payer: Self-pay | Admitting: Internal Medicine

## 2019-09-21 ENCOUNTER — Other Ambulatory Visit: Payer: Self-pay | Admitting: Internal Medicine

## 2019-09-21 NOTE — Telephone Encounter (Addendum)
Spoke with pharm D, who advised for pt to have a dose increase to Eliquis 5mg  BID.   Called and spoke to pt and made him aware of the dose change.   Prescription refill sent.

## 2019-09-21 NOTE — Telephone Encounter (Addendum)
Prescription refill request for Eliquis received.  Last office visit: Ronald [name] 05/23/2019 Scr: 1.31, 04/06/2019 Age: 85 y.o. Weight: 93.9 kg    Pt qualifies for a dose increase.

## 2019-10-03 NOTE — Progress Notes (Signed)
Subjective:   Ronald [name] is a 85 y.o. male who presents for Medicare Annual/Subsequent preventive examination.  Review of Systems     Cardiac Risk Factors include: advanced age (>23men, >72 women);diabetes mellitus;dyslipidemia;hypertension;male gender;sedentary lifestyle     Objective:    Today's Vitals   10/04/19 1018  BP: (!) 160/100  Pulse: 57  Temp: 98.1 F (36.7 C)  TempSrc: Oral  SpO2: 96%  Weight: 202 lb 3.2 oz (91.7 kg)  Height: 5\' 8"  (1.727 m)  Pt seeing PCP directly after this visit. PCP made aware of BP and CMA will recheck.  Body mass index is 30.74 kg/m.  Advanced Directives 10/04/2019 02/21/2019 01/31/2019 09/20/2018 09/17/2017 08/12/2017 07/08/2017  Does Patient Have a Medical Advance Directive? Yes Yes Yes Yes No Yes No  Type of Paramedic of [place];Living will Living will [place];Living will [place];Living will - [place] -  Does patient want to make changes to medical advance directive? No - Patient declined - - - - No - Patient declined -  Copy of [place] in Chart? No - copy requested - No - copy requested No - copy requested - No - copy requested -  Would patient like information on creating a medical advance directive? - - - - No - Patient declined - No - Patient declined  Pre-existing out of facility DNR order (yellow form or pink MOST form) - - - - - - -    Current Medications (verified) Outpatient Encounter Medications as of 10/04/2019  Medication Sig  . apixaban (ELIQUIS) 5 MG TABS tablet Take 1 tablet (5 mg total) by mouth 2 (two) times daily.  . benazepril (LOTENSIN) 20 MG tablet Take 1 tablet (20 mg total) by mouth daily.  . cholecalciferol (VITAMIN D) 1000 UNITS tablet Take 1,000 Units by mouth every evening.   . ferrous sulfate 325 (65 FE) MG tablet Take 325 mg by mouth daily with breakfast.  . flecainide (TAMBOCOR) 100 MG tablet Take 1  tablet (100 mg) by mouth once daily in the evening (Patient taking differently: Take 100 mg by mouth at bedtime. )  . flecainide (TAMBOCOR) 50 MG tablet Take 1 tablet (50 mg) by mouth once daily in the morning (Patient taking differently: Take 50 mg by mouth every morning. )  . furosemide (LASIX) 40 MG tablet Take 1 tablet by mouth once daily  . metoprolol succinate (TOPROL-XL) 50 MG 24 hr tablet Take 1 tablet (50 mg total) by mouth daily.  . Multiple Vitamin (MULTIVITAMIN WITH MINERALS) TABS tablet Take 1 tablet by mouth daily at 12 noon.  . Multiple Vitamins-Minerals (PRESERVISION AREDS 2) CAPS Take 1 capsule by mouth every evening.   Marland Kitchen NIFEdipine (PROCARDIA XL/NIFEDICAL XL) 60 MG 24 hr tablet Take 1 tablet (60 mg total) by mouth daily.  Marland Kitchen omeprazole (PRILOSEC) 40 MG capsule Take 1 capsule (40 mg total) by mouth 2 (two) times daily before a meal.  . Saw [place]metto 450 MG CAPS Take 450 mg by mouth every evening.  . simvastatin (ZOCOR) 40 MG tablet Take 1 tablet (40 mg total) by mouth at bedtime.  . Vitamin D, Ergocalciferol, (DRISDOL) 1.25 MG (50000 UNIT) CAPS capsule Take 1 capsule (50,000 Units total) by mouth every 7 (seven) days. (Patient not taking: Reported on 10/04/2019)   No facility-administered encounter medications on file as of 10/04/2019.    Allergies (verified) Cucumber extract and Sulfonamide derivatives   History: Past Medical  History:  Diagnosis Date  . Arthritis    one finger  . BPH (benign prostatic hypertrophy)    f/u @ Biopsy, s/p bx 2009 apro (-)  . Cataract   . Colon polyp   . Complication of anesthesia    violent upon waking up (only 1 time)   . Diverticulosis   . Dysrhythmia    A-flutter/Afib  . Early cataract    left  . Edema leg    asymetric , L > R , [place] neg for DVT 09-2010  . Gastric polyps   . GERD (gastroesophageal reflux disease)   . Hemorrhoids   . History of kidney stones   . Hypercalcemia    parathyroidectomy 04/2008  . Hyperlipidemia   .  Hypertension   . Iron deficiency anemia   . Polyp, stomach   . Pre-diabetes   . Wears dentures   . Wears glasses    Past Surgical History:  Procedure Laterality Date  . A-FLUTTER ABLATION N/A 08/12/2017   Procedure: A-FLUTTER ABLATION;  Surgeon: Deboraha Sprang, MD;  Location: [facility];  Service: Cardiovascular;  Laterality: N/A;  . APPENDECTOMY    . BIOPSY  09/20/2018   Procedure: BIOPSY;  Surgeon: Rush Landmark Telford Nab., MD;  Location: [place];  Service: Gastroenterology;;  . CATARACT EXTRACTION Right   . COLONOSCOPY  12/16/2011   Procedure: COLONOSCOPY;  Surgeon: Inda Castle, MD;  Location: [facility];  Service: Endoscopy;  Laterality: N/A;  . ENDOSCOPIC MUCOSAL RESECTION N/A 09/20/2018   Procedure: ENDOSCOPIC MUCOSAL RESECTION;  Surgeon: Rush Landmark Telford Nab., MD;  Location: [place];  Service: Gastroenterology;  Laterality: N/A;  . ENDOSCOPIC MUCOSAL RESECTION  01/31/2019   Procedure: ENDOSCOPIC MUCOSAL RESECTION;  Surgeon: Rush Landmark Telford Nab., MD;  Location[facility];  Service: Gastroenterology;;  . ESOPHAGOGASTRODUODENOSCOPY N/A 01/17/2013   Procedure: ESOPHAGOGASTRODUODENOSCOPY (EGD);  Surgeon: Inda Castle, MD;  Location: [facility];  Service: Endoscopy;  Laterality: N/A;  . ESOPHAGOGASTRODUODENOSCOPY (EGD) WITH PROPOFOL N/A 09/20/2018   Procedure: ESOPHAGOGASTRODUODENOSCOPY (EGD) WITH PROPOFOL;  Surgeon: Rush Landmark Telford Nab., MD;  Location: [place];  Service: Gastroenterology;  Laterality: N/A;  . ESOPHAGOGASTRODUODENOSCOPY (EGD) WITH PROPOFOL N/A 01/31/2019   Procedure: ESOPHAGOGASTRODUODENOSCOPY (EGD) WITH PROPOFOL;  Surgeon: Rush Landmark Telford Nab., MD;  Location: [place];  Service: Gastroenterology;  Laterality: N/A;  . EUS N/A 01/31/2019   Procedure: UPPER ENDOSCOPIC ULTRASOUND (EUS) RADIAL;  Surgeon: Rush Landmark Telford Nab., MD;  Location: [place];  Service: Gastroenterology;  Laterality: N/A;  . EXTERNAL EAR SURGERY   01/2016   BCC removal  . EYE SURGERY  infant   "lazy eye"  . FINE NEEDLE ASPIRATION  01/31/2019   Procedure: FINE NEEDLE ASPIRATION (FNA) LINEAR;  Surgeon: Irving Copas., MD;  Location: [facility];  Service: Gastroenterology;;  . Otho Darner SIGMOIDOSCOPY  02/09/2012   Procedure: FLEXIBLE SIGMOIDOSCOPY;  Surgeon: Inda Castle, MD;  Location[facility];  Service: Endoscopy;  Laterality: N/A;  . HEMORRHOID BANDING  02/09/2012   Procedure: HEMORRHOID BANDING;  Surgeon: Inda Castle, MD;  Location: [facility];  Service: Endoscopy;  Laterality: N/A;  . HEMOSTASIS CLIP PLACEMENT  09/20/2018   Procedure: HEMOSTASIS CLIP PLACEMENT;  Surgeon: Irving Copas., MD;  Location: [place];  Service: Gastroenterology;;  . HEMOSTASIS CLIP PLACEMENT  01/31/2019   Procedure: HEMOSTASIS CLIP PLACEMENT;  Surgeon: Irving Copas., MD;  Location: [place];  Service: Gastroenterology;;  . HOT HEMOSTASIS N/A 01/17/2013   Procedure: HOT HEMOSTASIS (ARGON PLASMA COAGULATION/BICAP);  Surgeon: Inda Castle, MD;  Location[facility];  Service: Endoscopy;  Laterality: N/A;  . KIDNEY STONE SURGERY    . MULTIPLE TOOTH EXTRACTIONS    . PARATHYROIDECTOMY  04/2008  . POLYPECTOMY  09/20/2018   Procedure: POLYPECTOMY;  Surgeon: Mansouraty, Telford Nab., MD;  Location: [place];  Service: Gastroenterology;;  . POLYPECTOMY  01/31/2019   Procedure: POLYPECTOMY;  Surgeon: Irving Copas., MD;  Location: [place];  Service: Gastroenterology;;  . Clide Deutscher  09/20/2018   Procedure: Clide Deutscher;  Surgeon: Irving Copas., MD;  Location: [place];  Service: Gastroenterology;;  . Clide Deutscher  01/31/2019   Procedure: Clide Deutscher;  Surgeon: Irving Copas., MD;  Location: [facility] ENDOSCOPY;  Service: Gastroenterology;;  . TONSILLECTOMY     Family History  Problem Relation Age of Onset  . Heart attack Father 52  . Heart attack Mother   . Pneumonia  Sister 6  . Colon cancer Neg Hx   . Prostate cancer Neg Hx   . Diabetes Neg Hx    Social History   Socioeconomic History  . Marital status: Widowed    Spouse name: Not on file  . Number of children: 3  . Years of education: Not on file  . Highest education level: Not on file  Occupational History  . Occupation: retired   Tobacco Use  . Smoking status: Former Smoker    Packs/day: 0.50    Years: 25.00    Pack years: 12.50    Types: Cigarettes    Quit date: 11/30/1981    Years since quitting: 37.8  . Smokeless tobacco: Never Used  Vaping Use  . Vaping Use: Never used  Substance and Sexual Activity  . Alcohol use: Yes    Alcohol/week: 7.0 standard drinks    Types: 7 Glasses of wine per week    Comment: occasional beer with dinner  . Drug use: No  . Sexual activity: Not on file  Other Topics Concern  . Not on file  Social History Narrative   Retired, single, has  a girlfriend , 3 children  (one lives in town)   Daughter has a copy of his living will   Social Determinants of Radio broadcast assistant Strain: [place]   . Difficulty of Paying Living Expenses: Not hard at all  Food Insecurity: No Food Insecurity  . Worried About Charity fundraiser in the Last Year: Never true  . Ran Out of Food in the Last Year: Never true  Transportation Needs: No Transportation Needs  . Lack of Transportation (Medical): No  . Lack of Transportation (Non-Medical): No  Physical Activity:   . Days of Exercise per Week:   . Minutes of Exercise per Session:   Stress:   . Feeling of Stress :   Social Connections:   . Frequency of Communication with Friends and Family:   . Frequency of Social Gatherings with Friends and Family:   . Attends Religious Services:   . Active Member of Clubs or Organizations:   . Attends Archivist Meetings:   Marland Kitchen Marital Status:     Tobacco Counseling Counseling given: Not Answered   Clinical Intake:     Pain : No/denies pain                  Activities of Daily Living In your present state of health, do you have any difficulty performing the following activities: 10/04/2019 04/06/2019  Hearing? N N  Vision? N N  Difficulty concentrating or making decisions? N N  Walking or climbing stairs? N N  Dressing or bathing? N N  Doing errands, shopping? N N  Preparing Food and eating ? N -  Using the Toilet? N -  In the past six months, have you accidently leaked urine? N -  Do you have problems with loss of bowel control? N -  Managing your Medications? N -  Managing your Finances? N -  Housekeeping or managing your Housekeeping? N -  Some recent data might be hidden    Patient Care Team: Colon Branch, MD as PCP - General Deboraha Sprang, MD as PCP - Electrophysiology (Cardiology) Clent Jacks, MD as Consulting Physician (Ophthalmology) Ceasar Mons, MD as Consulting Physician (Urology) Mansouraty, Telford Nab., MD as Consulting Physician (Gastroenterology)  Indicate any recent Medical Services you may have received from other than [facility] providers in the past year (date may be approximate).     Assessment:   This is a routine wellness examination for Joshoa.   Dietary issues and exercise activities discussed: Current Exercise Habits: The patient does not participate in regular exercise at present, Exercise limited by: None identified Diet (meal preparation, eat out, water intake, caffeinated beverages, dairy products, fruits and vegetables): well balanced     Goals    . DIET - INCREASE WATER INTAKE    . Increase physical activity      Depression Screen PHQ 2/9 Scores 10/04/2019 04/06/2019 04/01/2018 02/12/2017 02/08/2016 06/29/2015 12/01/2014  PHQ - 2 Score 0 0 0 0 0 0 0    Fall Risk Fall Risk  10/04/2019 04/06/2019 04/01/2018 02/12/2017 02/08/2016  Falls in the past year? 0 0 0 No No  Number falls in past yr: 0 0 - - -  Injury with Fall? 0 0 - - -  Follow up Education provided;Falls prevention  discussed Falls evaluation completed - - -    Any stairs in or around the home? Yes  If so, are there any without handrails? No  Home free of loose throw rugs in walkways, pet beds, electrical cords, etc? Yes  Adequate lighting in your home to reduce risk of falls? Yes   ASSISTIVE DEVICES UTILIZED TO PREVENT FALLS:  Life alert? No  Use of a cane, walker or w/c? No  Grab bars in the bathroom? Yes  Shower chair or bench in shower? No  Elevated toilet seat or a handicapped toilet? Yes   TIMED UP AND GO:  Was the test performed? No .   Gait steady and fast without use of assistive device  Cognitive Function:      6CIT Screen 10/04/2019  What Year? 0 points  What month? 0 points  What time? 0 points  Count back from 20 0 points  Months in reverse 0 points  Repeat phrase 2 points  Total Score 2    Immunizations Immunization History  Administered Date(s) Administered  . Fluad Quad(high Dose 65+) 11/05/2018  . Influenza, High Dose Seasonal PF 12/01/2014, 02/08/2016, 01/20/2017  . Influenza, Seasonal, Injecte, Preservative Fre 03/26/2012  . Influenza,inj,Quad PF,6+ Mos 04/10/2014  . Influenza-Unspecified 12/16/2017  . PFIZER SARS-COV-2 Vaccination 04/17/2019, 05/11/2019  . Td 02/08/2016  . Tdap 03/10/2005  . Zoster 09/19/2009    TDAP status: Up to date Flu Vaccine status: Up to date Pneumococcal vaccine status: Declined,  Education has been provided regarding the importance of this vaccine but patient still declined. Advised may receive this vaccine at local pharmacy or Health Dept. Aware to provide a copy of the vaccination record if  obtained from local pharmacy or Health Dept. Verbalized acceptance and understanding.  Covid-19 vaccine status: Completed vaccines  Qualifies for Shingles Vaccine? Yes   Zostavax completed Yes     Screening Tests Health Maintenance  Topic Date Due  . HEMOGLOBIN A1C  10/04/2019  . PNA vac Low Risk Adult (1 of 2 - PCV13) 04/05/2020  (Originally 08/17/2001)  . INFLUENZA VACCINE  10/09/2019  . FOOT EXAM  04/05/2020  . OPHTHALMOLOGY EXAM  07/17/2020  . TETANUS/TDAP  02/07/2026  . COVID-19 Vaccine  Completed    Health Maintenance  Health Maintenance Due  Topic Date Due  . HEMOGLOBIN A1C  10/04/2019    Colorectal cancer screening: No longer required.   Lung Cancer Screening: (Low Dose CT Chest recommended if Age 28-80 years, 30 pack-year currently smoking OR have quit w/in 15years.) does not qualify.    Additional Screening:  Vision Screening: Recommended annual ophthalmology exams for early detection of glaucoma and other disorders of the eye. Is the patient up to date with their annual eye exam?  Yes  Who is the provider or what is the name of the office in which the patient attends annual eye exams? Dr.Lyles   Dental Screening: Recommended annual dental exams for proper oral hygiene  Community Resource Referral / Chronic Care Management: CRR required this visit?  No   CCM required this visit?  No      Plan:    Please schedule your next medicare wellness visit with me in 1 yr.  Continue to eat heart healthy diet (full of fruits, vegetables, whole grains, lean protein, water--limit salt, fat, and sugar intake) and increase physical activity as tolerated.  Continue doing brain stimulating activities (puzzles, reading, adult coloring books, staying active) to keep memory sharp.   Bring a copy of your living will and/or healthcare power of attorney to your next office visit.   I have personally reviewed and noted the following in the patient's chart:   . Medical and social history . Use of alcohol, tobacco or illicit drugs  . Current medications and supplements . Functional ability and status . Nutritional status . Physical activity . Advanced directives . List of other physicians . Hospitalizations, surgeries, and ER visits in previous 12 months . Vitals . Screenings to include cognitive,  depression, and falls . Referrals and appointments  In addition, I have reviewed and discussed with patient certain preventive protocols, quality metrics, and best practice recommendations. A written personalized care plan for preventive services as well as general preventive health recommendations were provided to patient.     Naaman Plummer [place], [place]   10/04/2019

## 2019-10-04 ENCOUNTER — Encounter: Payer: Self-pay | Admitting: Internal Medicine

## 2019-10-04 ENCOUNTER — Other Ambulatory Visit: Payer: Self-pay

## 2019-10-04 ENCOUNTER — Encounter: Payer: Self-pay | Admitting: *Deleted

## 2019-10-04 ENCOUNTER — Ambulatory Visit (INDEPENDENT_AMBULATORY_CARE_PROVIDER_SITE_OTHER): Payer: PPO | Admitting: *Deleted

## 2019-10-04 ENCOUNTER — Ambulatory Visit (INDEPENDENT_AMBULATORY_CARE_PROVIDER_SITE_OTHER): Payer: PPO | Admitting: Internal Medicine

## 2019-10-04 VITALS — BP 160/100 | HR 57 | Temp 98.1°F | Ht 68.0 in | Wt 202.2 lb

## 2019-10-04 VITALS — BP 132/70 | HR 57 | Temp 98.1°F | Ht 68.0 in | Wt 202.2 lb

## 2019-10-04 DIAGNOSIS — E1169 Type 2 diabetes mellitus with other specified complication: Secondary | ICD-10-CM | POA: Diagnosis not present

## 2019-10-04 DIAGNOSIS — D509 Iron deficiency anemia, unspecified: Secondary | ICD-10-CM

## 2019-10-04 DIAGNOSIS — I10 Essential (primary) hypertension: Secondary | ICD-10-CM

## 2019-10-04 DIAGNOSIS — I483 Typical atrial flutter: Secondary | ICD-10-CM | POA: Diagnosis not present

## 2019-10-04 DIAGNOSIS — C49A0 Gastrointestinal stromal tumor, unspecified site: Secondary | ICD-10-CM | POA: Diagnosis not present

## 2019-10-04 DIAGNOSIS — Z0000 Encounter for general adult medical examination without abnormal findings: Secondary | ICD-10-CM

## 2019-10-04 DIAGNOSIS — Z862 Personal history of diseases of the blood and blood-forming organs and certain disorders involving the immune mechanism: Secondary | ICD-10-CM | POA: Diagnosis not present

## 2019-10-04 DIAGNOSIS — Z8509 Personal history of malignant neoplasm of other digestive organs: Secondary | ICD-10-CM

## 2019-10-04 LAB — CBC WITH DIFFERENTIAL/PLATELET
Basophils Absolute: 0 10*3/uL (ref 0.0–0.1)
Basophils Relative: 0.6 % (ref 0.0–3.0)
Eosinophils Absolute: 0.1 10*3/uL (ref 0.0–0.7)
Eosinophils Relative: 1.7 % (ref 0.0–5.0)
HCT: 48.4 % (ref 39.0–52.0)
Hemoglobin: 16.3 g/dL (ref 13.0–17.0)
Lymphocytes Relative: 24.9 % (ref 12.0–46.0)
Lymphs Abs: 1.7 10*3/uL (ref 0.7–4.0)
MCHC: 33.6 g/dL (ref 30.0–36.0)
MCV: 93.7 fl (ref 78.0–100.0)
Monocytes Absolute: 0.5 10*3/uL (ref 0.1–1.0)
Monocytes Relative: 7.3 % (ref 3.0–12.0)
Neutro Abs: 4.4 10*3/uL (ref 1.4–7.7)
Neutrophils Relative %: 65.5 % (ref 43.0–77.0)
Platelets: 178 10*3/uL (ref 150.0–400.0)
RBC: 5.17 Mil/uL (ref 4.22–5.81)
RDW: 13.8 % (ref 11.5–15.5)
WBC: 6.7 10*3/uL (ref 4.0–10.5)

## 2019-10-04 LAB — BASIC METABOLIC PANEL
BUN: 23 mg/dL (ref 6–23)
CO2: 33 mEq/L — ABNORMAL HIGH (ref 19–32)
Calcium: 9.1 mg/dL (ref 8.4–10.5)
Chloride: 103 mEq/L (ref 96–112)
Creatinine, Ser: 1.42 mg/dL (ref 0.40–1.50)
GFR: 47.6 mL/min — ABNORMAL LOW (ref >60.00)
Glucose, Bld: 106 mg/dL — ABNORMAL HIGH (ref 70–99)
Potassium: 4 mEq/L (ref 3.5–5.1)
Sodium: 142 mEq/L (ref 135–145)

## 2019-10-04 LAB — HEMOGLOBIN A1C: Hgb A1c MFr Bld: 6 % (ref 4.6–6.5)

## 2019-10-04 NOTE — Patient Instructions (Signed)
Please schedule your next medicare wellness visit with me in 1 yr.  Continue to eat heart healthy diet (full of fruits, vegetables, whole grains, lean protein, water--limit salt, fat, and sugar intake) and increase physical activity as tolerated.  Continue doing brain stimulating activities (puzzles, reading, adult coloring books, staying active) to keep memory sharp.   Bring a copy of your living will and/or healthcare power of attorney to your next office visit.   Ronald [name] , Thank you for taking time to come for your Medicare Wellness Visit. I appreciate your ongoing commitment to your health goals. Please review the following plan we discussed and let me know if I can assist you in the future.   These are the goals we discussed: Goals     DIET - INCREASE WATER INTAKE     Increase physical activity       This is a list of the screening recommended for you and due dates:  Health Maintenance  Topic Date Due   Hemoglobin A1C  10/04/2019   Pneumonia vaccines (1 of 2 - PCV13) 04/05/2020*   Flu Shot  10/09/2019   Complete foot exam   04/05/2020   Eye exam for diabetics  07/17/2020   Tetanus Vaccine  02/07/2026   COVID-19 Vaccine  Completed  *Topic was postponed. The date shown is not the original due date.    Preventive Care 10 Years and Older, Male Preventive care refers to lifestyle choices and visits with your health care provider that can promote health and wellness. This includes:  A yearly physical exam. This is also called an annual well check.  Regular dental and eye exams.  Immunizations.  Screening for certain conditions.  Healthy lifestyle choices, such as diet and exercise. What can I expect for my preventive care visit? Physical exam Your health care provider will check:  Height and weight. These may be used to calculate body mass index (BMI), which is a measurement that tells if you are at a healthy weight.  Heart rate and blood pressure.  Your  skin for abnormal spots. Counseling Your health care provider may ask you questions about:  Alcohol, tobacco, and drug use.  Emotional well-being.  Home and relationship well-being.  Sexual activity.  Eating habits.  History of falls.  Memory and ability to understand (cognition).  Work and work Statistician. What immunizations do I need?  Influenza (flu) vaccine  This is recommended every year. Tetanus, diphtheria, and pertussis (Tdap) vaccine  You may need a Td booster every 10 years. Varicella (chickenpox) vaccine  You may need this vaccine if you have not already been vaccinated. Zoster (shingles) vaccine  You may need this after age 36. Pneumococcal conjugate (PCV13) vaccine  One dose is recommended after age 55. Pneumococcal polysaccharide (PPSV23) vaccine  One dose is recommended after age 30. Measles, mumps, and rubella (MMR) vaccine  You may need at least one dose of MMR if you were born in 1957 or later. You may also need a second dose. Meningococcal conjugate (MenACWY) vaccine  You may need this if you have certain conditions. Hepatitis A vaccine  You may need this if you have certain conditions or if you travel or work in places where you may be exposed to hepatitis A. Hepatitis B vaccine  You may need this if you have certain conditions or if you travel or work in places where you may be exposed to hepatitis B. Haemophilus influenzae type b (Hib) vaccine  You may need this if  you have certain conditions. You may receive vaccines as individual doses or as more than one vaccine together in one shot (combination vaccines). Talk with your health care provider about the risks and benefits of combination vaccines. What tests do I need? Blood tests  Lipid and cholesterol levels. These may be checked every 5 years, or more frequently depending on your overall health.  Hepatitis C test.  Hepatitis B test. Screening  Lung cancer screening. You may  have this screening every year starting at age 71 if you have a 30-pack-year history of smoking and currently smoke or have quit within the past 15 years.  Colorectal cancer screening. All adults should have this screening starting at age 84 and continuing until age 58. Your health care provider may recommend screening at age 48 if you are at increased risk. You will have tests every 1-10 years, depending on your results and the type of screening test.  Prostate cancer screening. Recommendations will vary depending on your family history and other risks.  Diabetes screening. This is done by checking your blood sugar (glucose) after you have not eaten for a while (fasting). You may have this done every 1-3 years.  Abdominal aortic aneurysm (AAA) screening. You may need this if you are a current or former smoker.  Sexually transmitted disease (STD) testing. Follow these instructions at home: Eating and drinking  Eat a diet that includes fresh fruits and vegetables, whole grains, lean protein, and low-fat dairy products. Limit your intake of foods with high amounts of sugar, saturated fats, and salt.  Take vitamin and mineral supplements as recommended by your health care provider.  Do not drink alcohol if your health care provider tells you not to drink.  If you drink alcohol: ? Limit how much you have to 0-2 drinks a day. ? Be aware of how much alcohol is in your drink. In the [place] one drink equals one 12 oz bottle of beer (355 mL), one 5 oz glass of wine (148 mL), or one 1 oz glass of hard liquor (44 mL). Lifestyle  Take daily care of your teeth and gums.  Stay active. Exercise for at least 30 minutes on 5 or more days each week.  Do not use any products that contain nicotine or tobacco, such as cigarettes, e-cigarettes, and chewing tobacco. If you need help quitting, ask your health care provider.  If you are sexually active, practice safe sex. Use a condom or other form of  protection to prevent STIs (sexually transmitted infections).  Talk with your health care provider about taking a low-dose aspirin or statin. What's next?  Visit your health care provider once a year for a well check visit.  Ask your health care provider how often you should have your eyes and teeth checked.  Stay up to date on all vaccines. This information is not intended to replace advice given to you by your health care provider. Make sure you discuss any questions you have with your health care provider. Document Revised: 02/18/2018 Document Reviewed: 02/18/2018 Elsevier Patient Education  2020 [employer].

## 2019-10-04 NOTE — Progress Notes (Signed)
Subjective:    Patient ID: Ronald [name], male    DOB: Sep 20, 1934, 85 y.o.   MRN: [id number]  DOS:  10/04/2019 Type of visit - description: Follow-up Since the last office visit he is doing well. He had questions about his history of GIST and the increased dose of Eliquis recently.  Review of Systems Feels well. Denies chest pain or difficulty breathing No nausea, vomiting, blood in the stools. No cough  Past Medical History:  Diagnosis Date  . Arthritis    one finger  . BPH (benign prostatic hypertrophy)    f/u @ Biopsy, s/p bx 2009 apro (-)  . Cataract   . Colon polyp   . Complication of anesthesia    violent upon waking up (only 1 time)   . Diverticulosis   . Dysrhythmia    A-flutter/Afib  . Early cataract    left  . Edema leg    asymetric , L > R , [place] neg for DVT 09-2010  . Gastric polyps   . GERD (gastroesophageal reflux disease)   . Hemorrhoids   . History of kidney stones   . Hypercalcemia    parathyroidectomy 04/2008  . Hyperlipidemia   . Hypertension   . Iron deficiency anemia   . Polyp, stomach   . Pre-diabetes   . Wears dentures   . Wears glasses     Past Surgical History:  Procedure Laterality Date  . A-FLUTTER ABLATION N/A 08/12/2017   Procedure: A-FLUTTER ABLATION;  Surgeon: Deboraha Sprang, MD;  Location: [place] [facility];  Service: Cardiovascular;  Laterality: N/A;  . APPENDECTOMY    . BIOPSY  09/20/2018   Procedure: BIOPSY;  Surgeon: Rush Landmark Telford Nab., MD;  Location: [place];  Service: Gastroenterology;;  . CATARACT EXTRACTION Right   . COLONOSCOPY  12/16/2011   Procedure: COLONOSCOPY;  Surgeon: Inda Castle, MD;  [facility]: [facility];  Service: Endoscopy;  Laterality: N/A;  . ENDOSCOPIC MUCOSAL RESECTION N/A 09/20/2018   Procedure: ENDOSCOPIC MUCOSAL RESECTION;  Surgeon: Rush Landmark Telford Nab., MD;  Location: [place];  Service: Gastroenterology;  Laterality: N/A;  . ENDOSCOPIC MUCOSAL RESECTION  01/31/2019   Procedure:  ENDOSCOPIC MUCOSAL RESECTION;  Surgeon: Rush Landmark Telford Nab., MD;  Location: [facility];  Service: Gastroenterology;;  . ESOPHAGOGASTRODUODENOSCOPY N/A 01/17/2013   Procedure: ESOPHAGOGASTRODUODENOSCOPY (EGD);  Surgeon: Inda Castle, MD;  Location: [facility];  Service: Endoscopy;  Laterality: N/A;  . ESOPHAGOGASTRODUODENOSCOPY (EGD) WITH PROPOFOL N/A 09/20/2018   Procedure: ESOPHAGOGASTRODUODENOSCOPY (EGD) WITH PROPOFOL;  Surgeon: Rush Landmark Telford Nab., MD;  Location: [place];  Service: Gastroenterology;  Laterality: N/A;  . ESOPHAGOGASTRODUODENOSCOPY (EGD) WITH PROPOFOL N/A 01/31/2019   Procedure: ESOPHAGOGASTRODUODENOSCOPY (EGD) WITH PROPOFOL;  Surgeon: Rush Landmark Telford Nab., MD;  Location: [place];  Service: Gastroenterology;  Laterality: N/A;  . EUS N/A 01/31/2019   Procedure: UPPER ENDOSCOPIC ULTRASOUND (EUS) RADIAL;  Surgeon: Rush Landmark Telford Nab., MD;  Location: [place];  Service: Gastroenterology;  Laterality: N/A;  . EXTERNAL EAR SURGERY  01/2016   BCC removal  . EYE SURGERY  infant   "lazy eye"  . FINE NEEDLE ASPIRATION  01/31/2019   Procedure: FINE NEEDLE ASPIRATION (FNA) LINEAR;  Surgeon: Irving Copas., MD;  Location: [facility] ENDOSCOPY;  Service: Gastroenterology;;  . Otho Darner SIGMOIDOSCOPY  02/09/2012   Procedure: FLEXIBLE SIGMOIDOSCOPY;  Surgeon: Inda Castle, MD;  [facility];  Service: Endoscopy;  Laterality: N/A;  . HEMORRHOID BANDING  02/09/2012   Procedure: HEMORRHOID BANDING;  Surgeon: Inda Castle, MD;  Location:  [facility];  Service: Endoscopy;  Laterality: N/A;  . HEMOSTASIS CLIP PLACEMENT  09/20/2018   Procedure: HEMOSTASIS CLIP PLACEMENT;  Surgeon: Irving Copas., MD;  Location: [place];  Service: Gastroenterology;;  . HEMOSTASIS CLIP PLACEMENT  01/31/2019   Procedure: HEMOSTASIS CLIP PLACEMENT;  Surgeon: Irving Copas., MD;  Location: [place];  Service: Gastroenterology;;  . HOT HEMOSTASIS  N/A 01/17/2013   Procedure: HOT HEMOSTASIS (ARGON PLASMA COAGULATION/BICAP);  Surgeon: Inda Castle, MD;  Location: [facility];  Service: Endoscopy;  Laterality: N/A;  . KIDNEY STONE SURGERY    . MULTIPLE TOOTH EXTRACTIONS    . PARATHYROIDECTOMY  04/2008  . POLYPECTOMY  09/20/2018   Procedure: POLYPECTOMY;  Surgeon: Mansouraty, Telford Nab., MD;  Location: [place];  Service: Gastroenterology;;  . POLYPECTOMY  01/31/2019   Procedure: POLYPECTOMY;  Surgeon: Irving Copas., MD;  Location: [place];  Service: Gastroenterology;;  . Clide Deutscher  09/20/2018   Procedure: Clide Deutscher;  Surgeon: Irving Copas., MD;  Location: [place];  Service: Gastroenterology;;  . Clide Deutscher  01/31/2019   Procedure: Clide Deutscher;  Surgeon: Irving Copas., MD;  Location: [facility] ENDOSCOPY;  Service: Gastroenterology;;  . TONSILLECTOMY      Allergies as of 10/04/2019      Reactions   Cucumber Extract Nausea And Vomiting   Sulfonamide Derivatives Other (See Comments)   "don't know reaction" per pt      Medication List       Accurate as of October 04, 2019 11:10 AM. If you have any questions, ask your nurse or doctor.        apixaban 5 MG Tabs tablet Commonly known as: ELIQUIS Take 1 tablet (5 mg total) by mouth 2 (two) times daily.   benazepril 20 MG tablet Commonly known as: LOTENSIN Take 1 tablet (20 mg total) by mouth daily.   cholecalciferol 1000 units tablet Commonly known as: VITAMIN D Take 1,000 Units by mouth every evening.   ferrous sulfate 325 (65 FE) MG tablet Take 325 mg by mouth daily with breakfast.   flecainide 100 MG tablet Commonly known as: TAMBOCOR Take 1 tablet (100 mg) by mouth once daily in the evening What changed:   how much to take  how to take this  when to take this  additional instructions   flecainide 50 MG tablet Commonly known as: TAMBOCOR Take 1 tablet (50 mg) by mouth once daily in the morning What changed:    how much to take  how to take this  when to take this  additional instructions   furosemide 40 MG tablet Commonly known as: LASIX Take 1 tablet by mouth once daily   metoprolol succinate 50 MG 24 hr tablet Commonly known as: TOPROL-XL Take 1 tablet (50 mg total) by mouth daily.   multivitamin with minerals Tabs tablet Take 1 tablet by mouth daily at 12 noon.   NIFEdipine 60 MG 24 hr tablet Commonly known as: PROCARDIA XL/NIFEDICAL XL Take 1 tablet (60 mg total) by mouth daily.   omeprazole 40 MG capsule Commonly known as: PriLOSEC Take 1 capsule (40 mg total) by mouth 2 (two) times daily before a meal.   PreserVision AREDS 2 Caps Take 1 capsule by mouth every evening.   Saw Palmetto 450 MG Caps Take 450 mg by mouth every evening.   simvastatin 40 MG tablet Commonly known as: ZOCOR Take 1 tablet (40 mg total) by mouth at bedtime.   Vitamin D (Ergocalciferol) 1.25 MG (50000 UNIT) Caps capsule Commonly known  as: DRISDOL Take 1 capsule (50,000 Units total) by mouth every 7 (seven) days.          Objective:   Physical Exam BP (!) 132/70 (BP Location: Left Arm)   Pulse 57   Temp 98.1 F (36.7 C)   Ht 5\' 8"  (1.727 m)   Wt 202 lb 3 oz (91.7 kg)   SpO2 96%   BMI 30.74 kg/m  General:   Well developed, NAD, BMI noted. HEENT:  Normocephalic . Face symmetric, atraumatic Lungs:  CTA B Normal respiratory effort, no intercostal retractions, no accessory muscle use. Heart: Seems regular today,  no murmur.  Lower extremities: Edema at baseline Skin: Not pale. Not jaundice Neurologic:  alert & oriented X3.  Speech normal, gait appropriate for age and unassisted Psych--  Cognition and judgment appear intact.  Cooperative with normal attention span and concentration.  Behavior appropriate. No anxious or depressed appearing.      Assessment     Assessment   DM HTN Hyperlipidemia Osteopenia T score -2.0 (2010), T score -1.6 (12-2014); T score -1.3  (03-2018). 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  PLAN DM: Diet controlled, check A1c HTN: BP elevated upon arrival, recheck: 132/70. Good compliance with Lotensin, Lasix, metoprolol, nifedipine.  Recommend ambulatory BPs, check a BMP L E  edema: At baseline, he temporarily held nifedipine and edema did not improve. Atrial fibrillation: On Eliquis, recently dose was increased from 2.5 to 5 mg due to improved kidney function, we had a long discussion about it and explained is the reason why Eliquis dose was increased . Pulmonary nodule: See last visit, follow-up CT 2-21 benign. Gastric GIST: Due for endoscopy?   Check a CBC.  I communicated via message to GI, they are planning endoscopies in the next few months and they will do additional labs.  Appreciate GI input. Recommend a flu shot this year RTC 1-20 22 CPX   This visit occurred during the SARS-CoV-2 public health emergency.  Safety protocols were in place, including screening questions prior to the visit, additional usage of staff PPE, and extensive cleaning of exam room while observing appropriate contact time as indicated for disinfecting solutions.

## 2019-10-04 NOTE — Patient Instructions (Addendum)
Check the  blood pressure weekly  BP GOAL is between 110/65 and  135/85. If it is consistently higher or lower, let me know    GO TO THE LAB : Get the blood work     Ronald [name], Ronald [name] back for a physical exam by 1-20 22

## 2019-10-05 DIAGNOSIS — C49A0 Gastrointestinal stromal tumor, unspecified site: Secondary | ICD-10-CM | POA: Insufficient documentation

## 2019-10-05 NOTE — Assessment & Plan Note (Signed)
DM: Diet controlled, check A1c HTN: BP elevated upon arrival, recheck: 132/70. Good compliance with Lotensin, Lasix, metoprolol, nifedipine.  Recommend ambulatory BPs, check a BMP L E  edema: At baseline, he temporarily held nifedipine and edema did not improve. Atrial fibrillation: On Eliquis, recently dose was increased from 2.5 to 5 mg due to improved kidney function, we had a long discussion about it and explained is the reason why Eliquis dose was increased . Pulmonary nodule: See last visit, follow-up CT 2-21 benign. Gastric GIST: Due for endoscopy?   Check a CBC.  I communicated via message to GI, they are planning endoscopies in the next few months and they will do additional labs.  Appreciate GI input. Recommend a flu shot this year RTC 1-20 22 CPX

## 2019-10-18 ENCOUNTER — Other Ambulatory Visit (INDEPENDENT_AMBULATORY_CARE_PROVIDER_SITE_OTHER): Payer: PPO

## 2019-10-18 DIAGNOSIS — Z8509 Personal history of malignant neoplasm of other digestive organs: Secondary | ICD-10-CM

## 2019-10-18 DIAGNOSIS — D509 Iron deficiency anemia, unspecified: Secondary | ICD-10-CM

## 2019-10-18 LAB — CBC WITH DIFFERENTIAL/PLATELET
Basophils Absolute: 0.1 10*3/uL (ref 0.0–0.1)
Basophils Relative: 0.9 % (ref 0.0–3.0)
Eosinophils Absolute: 0.1 10*3/uL (ref 0.0–0.7)
Eosinophils Relative: 1.9 % (ref 0.0–5.0)
HCT: 46.9 % (ref 39.0–52.0)
Hemoglobin: 15.7 g/dL (ref 13.0–17.0)
Lymphocytes Relative: 22.2 % (ref 12.0–46.0)
Lymphs Abs: 1.7 10*3/uL (ref 0.7–4.0)
MCHC: 33.5 g/dL (ref 30.0–36.0)
MCV: 93.1 fl (ref 78.0–100.0)
Monocytes Absolute: 0.5 10*3/uL (ref 0.1–1.0)
Monocytes Relative: 6.9 % (ref 3.0–12.0)
Neutro Abs: 5.4 10*3/uL (ref 1.4–7.7)
Neutrophils Relative %: 68.1 % (ref 43.0–77.0)
Platelets: 190 10*3/uL (ref 150.0–400.0)
RBC: 5.04 Mil/uL (ref 4.22–5.81)
RDW: 13.4 % (ref 11.5–15.5)
WBC: 7.9 10*3/uL (ref 4.0–10.5)

## 2019-10-18 LAB — TRANSFERRIN: Transferrin: 220 mg/dL (ref 212.0–360.0)

## 2019-10-18 LAB — FERRITIN: Ferritin: 51.7 ng/mL (ref 22.0–322.0)

## 2019-10-19 LAB — IRON, TOTAL/TOTAL IRON BINDING CAP
%SAT: 33 % (calc) (ref 20–48)
Iron: 90 ug/dL (ref 50–180)
TIBC: 274 mcg/dL (calc) (ref 250–425)

## 2019-11-13 ENCOUNTER — Other Ambulatory Visit: Payer: Self-pay | Admitting: Internal Medicine

## 2019-11-16 MED ORDER — FLECAINIDE ACETATE 50 MG PO TABS: ORAL_TABLET | ORAL | 1 refills | Status: DC

## 2019-11-22 ENCOUNTER — Other Ambulatory Visit: Payer: Self-pay | Admitting: Nurse Practitioner

## 2019-11-22 ENCOUNTER — Other Ambulatory Visit: Payer: Self-pay | Admitting: Internal Medicine

## 2019-11-22 DIAGNOSIS — I10 Essential (primary) hypertension: Secondary | ICD-10-CM

## 2019-11-28 ENCOUNTER — Encounter: Payer: Self-pay | Admitting: Internal Medicine

## 2019-12-22 DIAGNOSIS — R310 Gross hematuria: Secondary | ICD-10-CM | POA: Diagnosis not present

## 2019-12-22 DIAGNOSIS — N401 Enlarged prostate with lower urinary tract symptoms: Secondary | ICD-10-CM | POA: Diagnosis not present

## 2019-12-22 DIAGNOSIS — R972 Elevated prostate specific antigen [PSA]: Secondary | ICD-10-CM | POA: Diagnosis not present

## 2019-12-22 DIAGNOSIS — R350 Frequency of micturition: Secondary | ICD-10-CM | POA: Diagnosis not present

## 2019-12-22 LAB — PSA: PSA: 15.5

## 2019-12-27 ENCOUNTER — Other Ambulatory Visit: Payer: Self-pay | Admitting: Urology

## 2019-12-27 DIAGNOSIS — R972 Elevated prostate specific antigen [PSA]: Secondary | ICD-10-CM

## 2019-12-28 ENCOUNTER — Encounter: Payer: Self-pay | Admitting: Internal Medicine

## 2020-01-18 ENCOUNTER — Other Ambulatory Visit: Payer: PPO

## 2020-01-23 ENCOUNTER — Other Ambulatory Visit: Payer: Self-pay | Admitting: Internal Medicine

## 2020-02-09 ENCOUNTER — Encounter (HOSPITAL_BASED_OUTPATIENT_CLINIC_OR_DEPARTMENT_OTHER): Payer: Self-pay | Admitting: Emergency Medicine

## 2020-02-09 ENCOUNTER — Emergency Department (HOSPITAL_BASED_OUTPATIENT_CLINIC_OR_DEPARTMENT_OTHER)
Admission: EM | Admit: 2020-02-09 | Discharge: 2020-02-09 | Disposition: A | Discharge status: Home / Self Care | Payer: PPO | Attending: Emergency Medicine | Admitting: Emergency Medicine

## 2020-02-09 ENCOUNTER — Other Ambulatory Visit: Payer: Self-pay

## 2020-02-09 DIAGNOSIS — Z7901 Long term (current) use of anticoagulants: Secondary | ICD-10-CM | POA: Diagnosis not present

## 2020-02-09 DIAGNOSIS — I10 Essential (primary) hypertension: Secondary | ICD-10-CM | POA: Insufficient documentation

## 2020-02-09 DIAGNOSIS — Z79899 Other long term (current) drug therapy: Secondary | ICD-10-CM | POA: Insufficient documentation

## 2020-02-09 DIAGNOSIS — R040 Epistaxis: Secondary | ICD-10-CM | POA: Diagnosis not present

## 2020-02-09 DIAGNOSIS — Z86010 Personal history of colonic polyps: Secondary | ICD-10-CM | POA: Diagnosis not present

## 2020-02-09 DIAGNOSIS — Z87891 Personal history of nicotine dependence: Secondary | ICD-10-CM | POA: Diagnosis not present

## 2020-02-09 NOTE — ED Provider Notes (Signed)
[place] DEPT [facility] Provider Note: Georgena Spurling, MD, FACEP  CSN: [id number] MRN: [id number] ARRIVAL: 02/09/20 at [place]: [place]  Epistaxis   HISTORY OF PRESENT ILLNESS  02/09/20 5:48 AM Ronald [name] is a 85 y.o. male who is on Eliquis for atrial fibrillation.  He is here with intermittent right-sided epistaxis for about the past week.  It became more severe yesterday having episodes most of the afternoon.  About 10 PM it began bleeding again and has persisted.  The bleeding has been heavy at times with development of clots.  He has not been able to get the bleeding to stop with pressure.  He is not lightheaded.  He has not having chest pain or shortness of breath.  He denies nausea or vomiting.   Past Medical History:  Diagnosis Date  . Arthritis    one finger  . BPH (benign prostatic hypertrophy)    f/u @ Biopsy, s/p bx 2009 apro (-)  . Cataract   . Colon polyp   . Complication of anesthesia    violent upon waking up (only 1 time)   . Diverticulosis   . Dysrhythmia    A-flutter/Afib  . Early cataract    left  . Edema leg    asymetric , L > R , [place] neg for DVT 09-2010  . Gastric polyps   . GERD (gastroesophageal reflux disease)   . Hemorrhoids   . History of kidney stones   . Hypercalcemia    parathyroidectomy 04/2008  . Hyperlipidemia   . Hypertension   . Iron deficiency anemia   . Polyp, stomach   . Pre-diabetes   . Wears dentures   . Wears glasses     Past Surgical History:  Procedure Laterality Date  . A-FLUTTER ABLATION N/A 08/12/2017   Procedure: A-FLUTTER ABLATION;  Surgeon: Deboraha Sprang, MD;  Location: [place] [facility];  Service: Cardiovascular;  Laterality: N/A;  . APPENDECTOMY    . BIOPSY  09/20/2018   Procedure: BIOPSY;  Surgeon: Rush Landmark Telford Nab., MD;  Location: [place];  Service: Gastroenterology;;  . CATARACT EXTRACTION Right   . COLONOSCOPY  12/16/2011   Procedure: COLONOSCOPY;  Surgeon: Inda Castle,  MD;  [facility];  Service: Endoscopy;  Laterality: N/A;  . ENDOSCOPIC MUCOSAL RESECTION N/A 09/20/2018   Procedure: ENDOSCOPIC MUCOSAL RESECTION;  Surgeon: Rush Landmark Telford Nab., MD;  Location: [place];  Service: Gastroenterology;  Laterality: N/A;  . ENDOSCOPIC MUCOSAL RESECTION  01/31/2019   Procedure: ENDOSCOPIC MUCOSAL RESECTION;  Surgeon: Rush Landmark Telford Nab., MD;  Location: [facility];  Service: Gastroenterology;;  . ESOPHAGOGASTRODUODENOSCOPY N/A 01/17/2013   Procedure: ESOPHAGOGASTRODUODENOSCOPY (EGD);  Surgeon: Inda Castle, MD;  Location: [facility];  Service: Endoscopy;  Laterality: N/A;  . ESOPHAGOGASTRODUODENOSCOPY (EGD) WITH PROPOFOL N/A 09/20/2018   Procedure: ESOPHAGOGASTRODUODENOSCOPY (EGD) WITH PROPOFOL;  Surgeon: Rush Landmark Telford Nab., MD;  Location: [place];  Service: Gastroenterology;  Laterality: N/A;  . ESOPHAGOGASTRODUODENOSCOPY (EGD) WITH PROPOFOL N/A 01/31/2019   Procedure: ESOPHAGOGASTRODUODENOSCOPY (EGD) WITH PROPOFOL;  Surgeon: Rush Landmark Telford Nab., MD;  Location: [place];  Service: Gastroenterology;  Laterality: N/A;  . EUS N/A 01/31/2019   Procedure: UPPER ENDOSCOPIC ULTRASOUND (EUS) RADIAL;  Surgeon: Rush Landmark Telford Nab., MD;  Location: [place];  Service: Gastroenterology;  Laterality: N/A;  . EXTERNAL EAR SURGERY  01/2016   BCC removal  . EYE SURGERY  infant   "lazy eye"  . FINE NEEDLE ASPIRATION  01/31/2019   Procedure: FINE NEEDLE ASPIRATION (  FNA) LINEAR;  Surgeon: Rush Landmark Telford Nab., MD;  Location: [facility] ENDOSCOPY;  Service: Gastroenterology;;  . Otho Darner SIGMOIDOSCOPY  02/09/2012   Procedure: FLEXIBLE SIGMOIDOSCOPY;  Surgeon: Inda Castle, MD;  Location: [facility];  Service: Endoscopy;  Laterality: N/A;  . HEMORRHOID BANDING  02/09/2012   Procedure: HEMORRHOID BANDING;  Surgeon: Inda Castle, MD;  [facility];  Service: Endoscopy;  Laterality: N/A;  . HEMOSTASIS CLIP PLACEMENT  09/20/2018     Procedure: HEMOSTASIS CLIP PLACEMENT;  Surgeon: Irving Copas., MD;  Location: [place];  Service: Gastroenterology;;  . HEMOSTASIS CLIP PLACEMENT  01/31/2019   Procedure: HEMOSTASIS CLIP PLACEMENT;  Surgeon: Irving Copas., MD;  Location: [place];  Service: Gastroenterology;;  . HOT HEMOSTASIS N/A 01/17/2013   Procedure: HOT HEMOSTASIS (ARGON PLASMA COAGULATION/BICAP);  Surgeon: Inda Castle, MD;  Location: [facility];  Service: Endoscopy;  Laterality: N/A;  . KIDNEY STONE SURGERY    . MULTIPLE TOOTH EXTRACTIONS    . PARATHYROIDECTOMY  04/2008  . POLYPECTOMY  09/20/2018   Procedure: POLYPECTOMY;  Surgeon: Mansouraty, Telford Nab., MD;  Location: [place];  Service: Gastroenterology;;  . POLYPECTOMY  01/31/2019   Procedure: POLYPECTOMY;  Surgeon: Irving Copas., MD;  Location: [place];  Service: Gastroenterology;;  . Clide Deutscher  09/20/2018   Procedure: Clide Deutscher;  Surgeon: Irving Copas., MD;  Location: [place];  Service: Gastroenterology;;  . Clide Deutscher  01/31/2019   Procedure: Clide Deutscher;  Surgeon: Irving Copas., MD;  Location: [facility] ENDOSCOPY;  Service: Gastroenterology;;  . TONSILLECTOMY      Family History  Problem Relation Age of Onset  . Heart attack Father 20  . Heart attack Mother   . Pneumonia Sister 6  . Colon cancer Neg Hx   . Prostate cancer Neg Hx   . Diabetes Neg Hx     Social History   Tobacco Use  . Smoking status: Former Smoker    Packs/day: 0.50    Years: 25.00    Pack years: 12.50    Types: Cigarettes    Quit date: 11/30/1981    Years since quitting: 38.2  . Smokeless tobacco: Never Used  Vaping Use  . Vaping Use: Never used  Substance Use Topics  . Alcohol use: Yes    Alcohol/week: 7.0 standard drinks    Types: 7 Glasses of wine per week    Comment: occasional beer with dinner  . Drug use: No    Prior to Admission medications   Medication Sig Start Date End Date  Taking? Authorizing Provider  NIFEdipine (PROCARDIA XL/NIFEDICAL XL) 60 MG 24 hr tablet Take 1 tablet (60 mg total) by mouth daily. 11/22/19   Colon Branch, MD  apixaban (ELIQUIS) 5 MG TABS tablet Take 1 tablet (5 mg total) by mouth 2 (two) times daily. 09/21/19   Deboraha Sprang, MD  benazepril (LOTENSIN) 20 MG tablet Take 1 tablet (20 mg total) by mouth daily. 01/23/20   Colon Branch, MD  cholecalciferol (VITAMIN D) 1000 UNITS tablet Take 1,000 Units by mouth every evening.     [provider]  ferrous sulfate 325 (65 FE) MG tablet Take 325 mg by mouth daily with breakfast.    [provider]  flecainide (TAMBOCOR) 100 MG tablet TAKE 1 TABLET BY MOUTH ONCE DAILY IN THE EVENING 11/16/19   Deboraha Sprang, MD  flecainide (TAMBOCOR) 50 MG tablet Take 1 tablet (50 mg) by mouth once daily in the morning 11/16/19   Deboraha Sprang, MD  furosemide (LASIX) 40 MG tablet Take 1 tablet by mouth once daily 05/10/19   Deboraha Sprang, MD  metoprolol succinate (TOPROL-XL) 50 MG 24 hr tablet Take 1 tablet by mouth once daily 11/22/19   Deboraha Sprang, MD  Multiple Vitamin (MULTIVITAMIN WITH MINERALS) TABS tablet Take 1 tablet by mouth daily at 12 noon.    [provider]  Multiple Vitamins-Minerals (PRESERVISION AREDS 2) CAPS Take 1 capsule by mouth every evening.     [provider]  omeprazole (PRILOSEC) 40 MG capsule Take 1 capsule (40 mg total) by mouth 2 (two) times daily before a meal. 02/21/19   Paz, Alda Berthold, MD  Saw Palmetto 450 MG CAPS Take 450 mg by mouth every evening.    [provider]  simvastatin (ZOCOR) 40 MG tablet Take 1 tablet (40 mg total) by mouth at bedtime. 09/01/19   Colon Branch, MD    Allergies Cucumber extract and Sulfonamide derivatives   REVIEW OF SYSTEMS  Negative except as noted here or in the History of Present Illness.   PHYSICAL EXAMINATION  Initial Vital Signs Blood pressure 100/84, pulse 93, temperature 97.6 F (36.4 C), temperature  source Tympanic, resp. rate 18, height 5\' 8"  (1.727 m), weight 88.5 kg, SpO2 99 %.  Examination General: Well-developed, well-nourished male in no acute distress; appearance consistent with age of record HENT: normocephalic; atraumatic; right anterior epistaxis Eyes: pupils equal, round and reactive to light; extraocular muscles intact Neck: supple Heart: Regular rate and rhythm Lungs: clear to auscultation bilaterally Abdomen: soft; nondistended; nontender; bowel sounds present Extremities: No deformity; full range of motion; 3+ edema of lower legs with prominent varicosities Neurologic: Awake, alert and oriented; motor function intact in all extremities and symmetric; no facial droop Skin: Warm and dry Psychiatric: Normal mood and affect   RESULTS  Summary of this visit's results, reviewed and interpreted by myself:   EKG Interpretation  Date/Time:    Ventricular Rate:    PR Interval:    QRS Duration:   QT Interval:    QTC Calculation:   R Axis:     Text Interpretation:        Laboratory Studies: No results found for this or any previous visit (from the past 24 hour(s)). Imaging Studies: No results found.  ED COURSE and MDM  Nursing notes, initial and subsequent vitals signs, including pulse oximetry, reviewed and interpreted by myself.  Vitals:   02/09/20 0533 02/09/20 0535 02/09/20 0600  BP:  100/84 113/82  Pulse:  93 77  Resp:  18 18  Temp:  97.6 F (36.4 C)   TempSrc:  Tympanic   SpO2:  99% 94%  Weight: 88.5 kg    Height: 5\' 8"  (1.727 m)     Medications - No data to display  6:18 AM Patient's bleeding remains well controlled with Rapid Rhino.  He was advised to return in 1 to 3 days for removal or sooner if bleeding recurs.  We will also refer him to ENT for further evaluation of his recurrent epistaxis.  PROCEDURES  .Epistaxis Management  Date/Time: 02/09/2020 5:52 AM Performed by: Shekina Cordell, Gregorey, MD Authorized by: Jezreel Sisk, Dwayne, MD   Consent:     Consent obtained:  Verbal   Consent given by:  Patient   Risks discussed:  Pain, infection and nasal injury Universal protocol:    Procedure explained and questions answered to patient or proxy's satisfaction: yes     Required blood products, implants, devices, and special equipment available:  yes     Site/side marked: no     Time out called: yes     Patient identity confirmed:  Verbally with patient Anesthesia (see MAR for exact dosages):    Anesthesia method:  None Procedure details:    Treatment site:  R anterior   Repair method: Rapid Rhino 5.5cm.   Treatment complexity:  Limited   Treatment episode: initial   Post-procedure details:    Assessment:  Bleeding stopped   Patient tolerance of procedure:  Tolerated well, no immediate complications   ED DIAGNOSES     ICD-10-CM   1. Right-sided epistaxis  R04.0        Tinea Nobile, Ronald Reichmann, MD 02/09/20 [id number]

## 2020-02-09 NOTE — ED Triage Notes (Signed)
Pt presents with bleeding from right nare since last night.

## 2020-02-10 ENCOUNTER — Encounter (HOSPITAL_BASED_OUTPATIENT_CLINIC_OR_DEPARTMENT_OTHER): Payer: Self-pay | Admitting: Emergency Medicine

## 2020-02-10 ENCOUNTER — Other Ambulatory Visit: Payer: Self-pay

## 2020-02-10 ENCOUNTER — Emergency Department (HOSPITAL_BASED_OUTPATIENT_CLINIC_OR_DEPARTMENT_OTHER): Payer: PPO

## 2020-02-10 ENCOUNTER — Observation Stay (HOSPITAL_BASED_OUTPATIENT_CLINIC_OR_DEPARTMENT_OTHER)
Admission: EM | Admit: 2020-02-10 | Discharge: 2020-02-11 | Disposition: A | Discharge status: Home / Self Care | Payer: PPO | Attending: Internal Medicine | Admitting: Internal Medicine

## 2020-02-10 DIAGNOSIS — Z87891 Personal history of nicotine dependence: Secondary | ICD-10-CM | POA: Diagnosis not present

## 2020-02-10 DIAGNOSIS — I10 Essential (primary) hypertension: Secondary | ICD-10-CM | POA: Insufficient documentation

## 2020-02-10 DIAGNOSIS — Z20822 Contact with and (suspected) exposure to covid-19: Secondary | ICD-10-CM | POA: Insufficient documentation

## 2020-02-10 DIAGNOSIS — R55 Syncope and collapse: Secondary | ICD-10-CM | POA: Diagnosis not present

## 2020-02-10 DIAGNOSIS — R000 Tachycardia, unspecified: Secondary | ICD-10-CM | POA: Diagnosis not present

## 2020-02-10 LAB — CBC WITH DIFFERENTIAL/PLATELET
Abs Immature Granulocytes: 0.07 10*3/uL (ref 0.00–0.07)
Basophils Absolute: 0 10*3/uL (ref 0.0–0.1)
Basophils Relative: 0 %
Eosinophils Absolute: 0.1 10*3/uL (ref 0.0–0.5)
Eosinophils Relative: 1 %
HCT: 44.8 % (ref 39.0–52.0)
Hemoglobin: 15.6 g/dL (ref 13.0–17.0)
Immature Granulocytes: 1 %
Lymphocytes Relative: 15 %
Lymphs Abs: 2.2 10*3/uL (ref 0.7–4.0)
MCH: 32.2 pg (ref 26.0–34.0)
MCHC: 34.8 g/dL (ref 30.0–36.0)
MCV: 92.4 fL (ref 80.0–100.0)
Monocytes Absolute: 0.8 10*3/uL (ref 0.1–1.0)
Monocytes Relative: 6 %
Neutro Abs: 11.2 10*3/uL — ABNORMAL HIGH (ref 1.7–7.7)
Neutrophils Relative %: 77 %
Platelets: 192 10*3/uL (ref 150–400)
RBC: 4.85 MIL/uL (ref 4.22–5.81)
RDW: 13 % (ref 11.5–15.5)
WBC: 14.3 10*3/uL — ABNORMAL HIGH (ref 4.0–10.5)
nRBC: 0 % (ref 0.0–0.2)

## 2020-02-10 LAB — COMPREHENSIVE METABOLIC PANEL
ALT: 16 U/L (ref 0–44)
AST: 18 U/L (ref 15–41)
Albumin: 3.2 g/dL — ABNORMAL LOW (ref 3.5–5.0)
Alkaline Phosphatase: 49 U/L (ref 38–126)
Anion gap: 9 (ref 5–15)
BUN: 28 mg/dL — ABNORMAL HIGH (ref 8–23)
CO2: 26 mmol/L (ref 22–32)
Calcium: 8.2 mg/dL — ABNORMAL LOW (ref 8.9–10.3)
Chloride: 101 mmol/L (ref 98–111)
Creatinine, Ser: 1.25 mg/dL — ABNORMAL HIGH (ref 0.61–1.24)
GFR, Estimated: 57 mL/min — ABNORMAL LOW (ref >60)
Glucose, Bld: 132 mg/dL — ABNORMAL HIGH (ref 70–99)
Potassium: 4.2 mmol/L (ref 3.5–5.1)
Sodium: 136 mmol/L (ref 135–145)
Total Bilirubin: 0.8 mg/dL (ref 0.3–1.2)
Total Protein: 6.3 g/dL — ABNORMAL LOW (ref 6.5–8.1)

## 2020-02-10 LAB — URINALYSIS, ROUTINE W REFLEX MICROSCOPIC
Bacteria, UA: NONE SEEN
Bilirubin Urine: NEGATIVE
Glucose, UA: NEGATIVE mg/dL
Ketones, ur: NEGATIVE mg/dL
Nitrite: NEGATIVE
Protein, ur: NEGATIVE mg/dL
Specific Gravity, Urine: 1.009 (ref 1.005–1.030)
pH: 6 (ref 5.0–8.0)

## 2020-02-10 LAB — HEMOGLOBIN A1C
Hgb A1c MFr Bld: 5.8 % — ABNORMAL HIGH (ref 4.8–5.6)
Mean Plasma Glucose: 119.76 mg/dL

## 2020-02-10 LAB — RESP PANEL BY RT-PCR (FLU A&B, COVID) ARPGX2
Influenza A by PCR: NEGATIVE
Influenza B by PCR: NEGATIVE
SARS Coronavirus 2 by RT PCR: NEGATIVE

## 2020-02-10 LAB — BRAIN NATRIURETIC PEPTIDE: B Natriuretic Peptide: 47.7 pg/mL (ref 0.0–100.0)

## 2020-02-10 LAB — TSH: TSH: 1.278 u[IU]/mL (ref 0.350–4.500)

## 2020-02-10 LAB — TROPONIN I (HIGH SENSITIVITY): Troponin I (High Sensitivity): 5 ng/L (ref <18)

## 2020-02-10 LAB — CBG MONITORING, ED: Glucose-Capillary: 136 mg/dL — ABNORMAL HIGH (ref 70–99)

## 2020-02-10 MED ORDER — SODIUM CHLORIDE 0.9 % IV BOLUS
500.0000 mL | Freq: Once | INTRAVENOUS | Status: AC
  Administered 2020-02-10: 500 mL via INTRAVENOUS

## 2020-02-10 MED ORDER — SODIUM CHLORIDE 0.9 % IV SOLN: INTRAVENOUS | Status: DC

## 2020-02-10 MED ORDER — APIXABAN 5 MG PO TABS
5.0000 mg | ORAL_TABLET | Freq: Two times a day (BID) | ORAL | Status: DC
  Administered 2020-02-10 – 2020-02-11 (×2): 5 mg via ORAL
  Filled 2020-02-10 (×2): qty 1

## 2020-02-10 MED ORDER — NIFEDIPINE ER OSMOTIC RELEASE 60 MG PO TB24
60.0000 mg | ORAL_TABLET | Freq: Every day | ORAL | Status: DC
  Administered 2020-02-11: 60 mg via ORAL
  Filled 2020-02-10: qty 1

## 2020-02-10 MED ORDER — FERROUS SULFATE 325 (65 FE) MG PO TABS
325.0000 mg | ORAL_TABLET | Freq: Every day | ORAL | Status: DC
  Administered 2020-02-11: 325 mg via ORAL
  Filled 2020-02-10: qty 1

## 2020-02-10 MED ORDER — PANTOPRAZOLE SODIUM 40 MG PO TBEC
40.0000 mg | DELAYED_RELEASE_TABLET | Freq: Every day | ORAL | Status: DC
  Administered 2020-02-11: 40 mg via ORAL
  Filled 2020-02-10: qty 1

## 2020-02-10 MED ORDER — METOPROLOL SUCCINATE ER 50 MG PO TB24
50.0000 mg | ORAL_TABLET | Freq: Every day | ORAL | Status: DC
  Administered 2020-02-11: 50 mg via ORAL
  Filled 2020-02-10: qty 1

## 2020-02-10 MED ORDER — FLECAINIDE ACETATE 100 MG PO TABS
100.0000 mg | ORAL_TABLET | Freq: Every day | ORAL | Status: DC
  Administered 2020-02-10: 100 mg via ORAL
  Filled 2020-02-10 (×3): qty 1

## 2020-02-10 MED ORDER — AMOXICILLIN-POT CLAVULANATE 875-125 MG PO TABS
1.0000 | ORAL_TABLET | Freq: Two times a day (BID) | ORAL | Status: DC
  Administered 2020-02-10: 1 via ORAL
  Filled 2020-02-10: qty 1

## 2020-02-10 MED ORDER — VITAMIN D 25 MCG (1000 UNIT) PO TABS: 1000.0000 [IU] | ORAL_TABLET | Freq: Every evening | ORAL | Status: DC

## 2020-02-10 MED ORDER — SIMVASTATIN 20 MG PO TABS
40.0000 mg | ORAL_TABLET | Freq: Every day | ORAL | Status: DC
  Administered 2020-02-10: 40 mg via ORAL
  Filled 2020-02-10: qty 2

## 2020-02-10 NOTE — ED Provider Notes (Addendum)
Patient sent from [facility] ED for admission for syncope. Pt initially presented there for recheck nosebleed and possible nasal packing removal. Was in waiting room chair when had syncope.   Pt in NSR, denies any current symptoms, other than requesting something to drink - provided.  Pt indicates does not want packing removed know - states he would like to leave it in place for another couple days (after discussion with EDP at [facility]).   Will consult hospitalist for admission.          Lajean Saver, MD 02/10/20 E[place] Crow

## 2020-02-10 NOTE — ED Provider Notes (Addendum)
[facility] EMERGENCY DEPARTMENT Provider Note   CSN: [id number] Arrival date & time: 02/10/20  1015     History Chief Complaint  Patient presents with  . Nasal Packing Removal    Ronald [name] is a 85 y.o. male.  Them in any sense patient is seen here yesterday and had nasal packing placed in his right nares.  For a nosebleed.  Patient is on Eliquis.  Patient was coming in today to have that removed.  Is had nosebleeds in the past.  Usually has had a packing in for 5 days.  But he was hoping to get it out.  Patient was up this morning walking around at home without any difficulties or feeling like he was going to pass out.  Patient felt fine or waiting room when he was brought back to triage while he was in the triage chair he had a syncopal episode without any warning.  Patient does recall feeling a little nauseated after he woke up but does not recall feeling anything unusual at the time.  He was not on a heart monitor when this occurred.  It then it reoccurred shortly thereafter that while still in triage.  The nurses felt that there was a period of apnea.  And they almost called cardiac arrest CODE BLUE.  Patient brought back to our trauma room upon arrival back at that time when he was flat on the gurney patient was awake feeling fine had no symptoms at all.  Denied any chest pain shortness of breath any headache.  Denied any bleeding in his bowel movements.  Stated that the [employer] that was placed in his right nares has been working well and there have been no bleeding from that.  Past medical history hyperlipidemia hypertension iron deficiency anemia gastroesophageal reflux disease.        Past Medical History:  Diagnosis Date  . Arthritis    one finger  . BPH (benign prostatic hypertrophy)    f/u @ Biopsy, s/p bx 2009 apro (-)  . Cataract   . Colon polyp   . Complication of anesthesia    violent upon waking up (only 1 time)   . Diverticulosis   . Dysrhythmia     A-flutter/Afib  . Early cataract    left  . Edema leg    asymetric , L > R , [place] neg for DVT 09-2010  . Gastric polyps   . GERD (gastroesophageal reflux disease)   . Hemorrhoids   . History of kidney stones   . Hypercalcemia    parathyroidectomy 04/2008  . Hyperlipidemia   . Hypertension   . Iron deficiency anemia   . Polyp, stomach   . Pre-diabetes   . Wears dentures   . Wears glasses     Patient Active Problem List   Diagnosis Date Noted  . 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 A-fib ([place]) 05/22/2019  . Atrial tachycardia ([place]) 05/22/2019  . Genetic testing 03/14/2019  . Gastric polyps   . 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 Lung nodule 11/19/2017  . Atrial flutter ([place]) 08/09/2016  . PCP NOTES >>>>>>>>>>>>> 12/02/2014  . 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 Personal history of colonic polyps 12/01/2011  . Annual physical exam 09/23/2010  . Elevated PSA 04/12/2008  . ERECTILE DYSFUNCTION 05/25/2007  . DMII (diabetes mellitus, type 2) ([place]) 03/02/2007  . CALCULUS OF KIDNEY 02/15/2007  . HYPERPARATHYROIDISM UNSPECIFIED 11/16/2006  . HYPERLIPIDEMIA 11/11/2006  . Essential  hypertension 11/11/2006  . GERD 11/11/2006    Past Surgical History:  Procedure Laterality Date  . A-FLUTTER ABLATION N/A 08/12/2017   Procedure: A-FLUTTER ABLATION;  Surgeon: Deboraha Sprang, MD;  Location: [facility];  Service: Cardiovascular;  Laterality: N/A;  . APPENDECTOMY    . BIOPSY  09/20/2018   Procedure: BIOPSY;  Surgeon: Rush Landmark Telford Nab., MD;  Location: [facility];  Service: Gastroenterology;;  . CATARACT EXTRACTION Right   . COLONOSCOPY  12/16/2011   Procedure: COLONOSCOPY;  Surgeon: Inda Castle, MD;  Location: [facility];  Service: Endoscopy;  Laterality: N/A;  . ENDOSCOPIC MUCOSAL RESECTION N/A 09/20/2018   Procedure: ENDOSCOPIC MUCOSAL RESECTION;  Surgeon: Rush Landmark Telford Nab., MD;  Location: [facility];  Service:  Gastroenterology;  Laterality: N/A;  . ENDOSCOPIC MUCOSAL RESECTION  01/31/2019   Procedure: ENDOSCOPIC MUCOSAL RESECTION;  Surgeon: Rush Landmark Telford Nab., MD;  Location: [facility] ENDOSCOPY;  Service: Gastroenterology;;  . ESOPHAGOGASTRODUODENOSCOPY N/A 01/17/2013   Procedure: ESOPHAGOGASTRODUODENOSCOPY (EGD);  Surgeon: Inda Castle, MD;  Location: [facility];  Service: Endoscopy;  Laterality: N/A;  . ESOPHAGOGASTRODUODENOSCOPY (EGD) WITH PROPOFOL N/A 09/20/2018   Procedure: ESOPHAGOGASTRODUODENOSCOPY (EGD) WITH PROPOFOL;  Surgeon: Rush Landmark Telford Nab., MD;  Location: [facility];  Service: Gastroenterology;  Laterality: N/A;  . ESOPHAGOGASTRODUODENOSCOPY (EGD) WITH PROPOFOL N/A 01/31/2019   Procedure: ESOPHAGOGASTRODUODENOSCOPY (EGD) WITH PROPOFOL;  Surgeon: Rush Landmark Telford Nab., MD;  Location: [facility];  Service: Gastroenterology;  Laterality: N/A;  . EUS N/A 01/31/2019   Procedure: UPPER ENDOSCOPIC ULTRASOUND (EUS) RADIAL;  Surgeon: Rush Landmark Telford Nab., MD;  Location: [facility];  Service: Gastroenterology;  Laterality: N/A;  . EXTERNAL EAR SURGERY  01/2016   BCC removal  . EYE SURGERY  infant   "lazy eye"  . FINE NEEDLE ASPIRATION  01/31/2019   Procedure: FINE NEEDLE ASPIRATION (FNA) LINEAR;  Surgeon: Irving Copas., MD;  Location: [facility] END[facility]COPY;  Service: Gastroenterology;;  . Otho Darner SIGMOIDOSCOPY  02/09/2012   Procedure: FLEXIBLE SIGMOIDOSCOPY;  Surgeon: Inda Castle, MD;  Location: [facility];  Service: Endoscopy;  Laterality: N/A;  . HEMORRHOID BANDING  02/09/2012   Procedure: HEMORRHOID BANDING;  Surgeon: Inda Castle, MD;  Location: [facility];  Service: Endoscopy;  Laterality: N/A;  . HEMOSTASIS CLIP PLACEMENT  09/20/2018   Procedure: HEMOSTASIS CLIP PLACEMENT;  Surgeon: Irving Copas., MD;  Location: [facility];  Service: Gastroenterology;;  . HEMOSTASIS CLIP PLACEMENT  01/31/2019   Procedure: HEMOSTASIS CLIP PLACEMENT;  Surgeon:  Irving Copas., MD;  Location: [facility];  Service: Gastroenterology;;  . HOT HEMOSTASIS N/A 01/17/2013   Procedure: HOT HEMOSTASIS (ARGON PLASMA COAGULATION/BICAP);  Surgeon: Inda Castle, MD;  Location: [facility];  Service: Endoscopy;  Laterality: N/A;  . KIDNEY STONE SURGERY    . MULTIPLE TOOTH EXTRACTIONS    . PARATHYROIDECTOMY  04/2008  . POLYPECTOMY  09/20/2018   Procedure: POLYPECTOMY;  Surgeon: Mansouraty, Telford Nab., MD;  Location: [facility];  Service: Gastroenterology;;  . POLYPECTOMY  01/31/2019   Procedure: POLYPECTOMY;  Surgeon: Irving Copas., MD;  Location: [facility];  Service: Gastroenterology;;  . Clide Deutscher  09/20/2018   Procedure: Clide Deutscher;  Surgeon: Irving Copas., MD;  Location: [facility];  Service: Gastroenterology;;  . Clide Deutscher  01/31/2019   Procedure: Clide Deutscher;  Surgeon: Irving Copas., MD;  Location: [facility] ENDOSCOPY;  Service: Gastroenterology;;  . TONSILLECTOMY         Family History  Problem Relation Age of Onset  . Heart attack Father 60  . Heart attack Mother   .  Pneumonia Sister 6  . Colon cancer Neg Hx   . Prostate cancer Neg Hx   . Diabetes Neg Hx     Social History   Tobacco Use  . Smoking status: Former Smoker    Packs/day: 0.50    Years: 25.00    Pack years: 12.50    Types: Cigarettes    Quit date: 11/30/1981    Years since quitting: 38.2  . Smokeless tobacco: Never Used  Vaping Use  . Vaping Use: Never used  Substance Use Topics  . Alcohol use: Yes    Alcohol/week: 7.0 standard drinks    Types: 7 Glasses of wine per week    Comment: occasional beer with dinner  . Drug use: No    Home Medications Prior to Admission medications   Medication Sig Start Date End Date Taking? Authorizing Provider  NIFEdipine (PROCARDIA XL/NIFEDICAL XL) 60 MG 24 hr tablet Take 1 tablet (60 mg total) by mouth daily. 11/22/19   Colon Branch, MD  apixaban (ELIQUIS) 5 MG TABS tablet Take 1  tablet (5 mg total) by mouth 2 (two) times daily. 09/21/19   Deboraha Sprang, MD  benazepril (LOTENSIN) 20 MG tablet Take 1 tablet (20 mg total) by mouth daily. 01/23/20   Colon Branch, MD  cholecalciferol (VITAMIN D) 1000 UNITS tablet Take 1,000 Units by mouth every evening.     [provider]  ferrous sulfate 325 (65 FE) MG tablet Take 325 mg by mouth daily with breakfast.    [provider]  flecainide (TAMBOCOR) 100 MG tablet TAKE 1 TABLET BY MOUTH ONCE DAILY IN THE EVENING 11/16/19   Deboraha Sprang, MD  flecainide (TAMBOCOR) 50 MG tablet Take 1 tablet (50 mg) by mouth once daily in the morning 11/16/19   Deboraha Sprang, MD  furosemide (LASIX) 40 MG tablet Take 1 tablet by mouth once daily 05/10/19   Deboraha Sprang, MD  metoprolol succinate (TOPROL-XL) 50 MG 24 hr tablet Take 1 tablet by mouth once daily 11/22/19   Deboraha Sprang, MD  Multiple Vitamin (MULTIVITAMIN WITH MINERALS) TABS tablet Take 1 tablet by mouth daily at 12 noon.    [provider]  Multiple Vitamins-Minerals (PRESERVISION AREDS 2) CAPS Take 1 capsule by mouth every evening.     [provider]  omeprazole (PRILOSEC) 40 MG capsule Take 1 capsule (40 mg total) by mouth 2 (two) times daily before a meal. 02/21/19   Paz, Alda Berthold, MD  Saw Palmetto 450 MG CAPS Take 450 mg by mouth every evening.    [provider]  simvastatin (ZOCOR) 40 MG tablet Take 1 tablet (40 mg total) by mouth at bedtime. 09/01/19   Colon Branch, MD    Allergies    Cucumber extract and Sulfonamide derivatives  Review of Systems   Review of Systems  Constitutional: Negative for chills and fever.  HENT: Positive for nosebleeds. Negative for congestion, rhinorrhea and sore throat.   Eyes: Negative for visual disturbance.  Respiratory: Negative for cough and shortness of breath.   Cardiovascular: Negative for chest pain and leg swelling.  Gastrointestinal: Negative for abdominal pain, diarrhea, nausea and vomiting.    Genitourinary: Negative for dysuria.  Musculoskeletal: Negative for back pain and neck pain.  Skin: Negative for rash.  Neurological: Positive for syncope. Negative for dizziness, light-headedness and headaches.  Hematological: Does not bruise/bleed easily.  Psychiatric/Behavioral: Negative for confusion.    Physical Exam Updated Vital Signs BP 128/82  Pulse 68   Temp 98.7 F (37.1 C)   Resp 20   Ht 1.727 m (5\' 8" )   Wt 88.5 kg   SpO2 96%   BMI 29.65 kg/m   Physical Exam Vitals and nursing note reviewed.  Constitutional:      Appearance: Normal appearance. He is well-developed.  HENT:     Head: Normocephalic and atraumatic.     Nose:     Comments: Rhino Rocket in the right nares.  No bleeding.    Mouth/Throat:     Comments: Oropharynx clear no bleeding. Eyes:     Extraocular Movements: Extraocular movements intact.     Conjunctiva/sclera: Conjunctivae normal.     Pupils: Pupils are equal, round, and reactive to light.  Cardiovascular:     Rate and Rhythm: Normal rate and regular rhythm.     Heart sounds: No murmur heard.   Pulmonary:     Effort: Pulmonary effort is normal. No respiratory distress.     Breath sounds: Normal breath sounds.  Abdominal:     Palpations: Abdomen is soft.     Tenderness: There is no abdominal tenderness.  Musculoskeletal:        General: No swelling. Normal range of motion.     Cervical back: Normal range of motion and neck supple.  Skin:    General: Skin is warm and dry.     Capillary Refill: Capillary refill takes less than 2 seconds.  Neurological:     General: No focal deficit present.     Mental Status: He is alert and oriented to person, place, and time.     Cranial Nerves: No cranial nerve deficit.     Sensory: No sensory deficit.     Motor: No weakness.     ED Results / Procedures / Treatments   Labs (all labs ordered are listed, but only abnormal results are displayed) Labs Reviewed  CBC WITH DIFFERENTIAL/PLATELET  - Abnormal; Notable for the following components:      Result Value   WBC 14.3 (*)    Neutro Abs 11.2 (*)    All other components within normal limits  COMPREHENSIVE METABOLIC PANEL - Abnormal; Notable for the following components:   Glucose, Bld 132 (*)    BUN 28 (*)    Creatinine, Ser 1.25 (*)    Calcium 8.2 (*)    Total Protein 6.3 (*)    Albumin 3.2 (*)    GFR, Estimated 57 (*)    All other components within normal limits  CBG MONITORING, ED - Abnormal; Notable for the following components:   Glucose-Capillary 136 (*)    All other components within normal limits  RESP PANEL BY RT-PCR (FLU A&B, COVID) ARPGX2  URINALYSIS, ROUTINE W REFLEX MICROSCOPIC  TROPONIN I (HIGH SENSITIVITY)  TROPONIN I (HIGH SENSITIVITY)    EKG EKG Interpretation  Date/Time:  Friday February 10 2020 11:10:29 EST Ventricular Rate:  69 PR Interval:    QRS Duration: 116 QT Interval:  465 QTC Calculation: 499 R Axis:   -42 Text Interpretation: Sinus rhythm Atrial premature complex Incomplete RBBB and LAFB Confirmed by Fredia Sorrow [phone]) on 02/10/2020 11:40:30 AM   Radiology DG Chest Port 1 View  Result Date: 02/10/2020 CLINICAL DATA:  Syncope EXAM: PORTABLE CHEST 1 VIEW COMPARISON:  May 2019 FINDINGS: Low lung volumes. Mild atelectasis/scarring at the right lung base. No significant pleural effusion. No pneumothorax. Similar cardiomediastinal contours with normal heart size. IMPRESSION: Mild atelectasis/scarring at the right lung base. Electronically Signed  By: Macy Mis M.D.   On: 02/10/2020 12:15    Procedures Procedures (including critical care time)  CRITICAL CARE Performed by: Fredia Sorrow Total critical care time: 35 minutes Critical care time was exclusive of separately billable procedures and treating other patients. Critical care was necessary to treat or prevent imminent or life-threatening deterioration. Critical care was time spent personally by me on the following  activities: development of treatment plan with patient and/or surrogate as well as nursing, discussions with consultants, evaluation of patient's response to treatment, examination of patient, obtaining history from patient or surrogate, ordering and performing treatments and interventions, ordering and review of laboratory studies, ordering and review of radiographic studies, pulse oximetry and re-evaluation of patient's condition.   Patient with 2 unexplained syncopal episodes in our triage chair.  Patient now asymptomatic.  On cardiac monitoring no arrhythmias.  No significant anemia.  Orthostatic blood pressures done and no significant changes most importantly patient has no symptoms with standing.  Patient recalls feeling nauseated after waking up while in the triage chair.  At one point nurses felt that he was apneic.  Main concern here is for cardiac arrhythmia since we have no good explanation for this syncopal episode x2 although close together that occurred in the sitting position in our triage area with an apneic episode.  Feel that patient needs to be admitted and cardiac monitoring.  Medications Ordered in ED Medications  0.9 %  sodium chloride infusion ( Intravenous New Bag/Given 02/10/20 1151)  sodium chloride 0.9 % bolus 500 mL (0 mLs Intravenous Stopped 02/10/20 1150)    ED Course  I have reviewed the triage vital signs and the nursing notes.  Pertinent labs & imaging results that were available during my care of the patient were reviewed by me and considered in my medical decision making (see chart for details).    MDM Rules/Calculators/A&P                          See above.  Discussed with hospitalist.  Hospitalist was feeling patient probably had syncopal vasovagal episode and could be discharged home. However we do not have any good proof of that because patient is not orthostatic here not anemic here.  Things came out of the blue.  Patient was ambulating at home and in  our waiting room without any difficulties.  I am still very concerned that this may represent a cardiac arrhythmia and patient needs cardiac monitoring.  The hospitalist I was just having discussion with Karmen Bongo.  Discussed with Dr. Laverta Baltimore evening physician here medical director at [facility] he also does not feel comfortable with patient staying here to be monitored or to be discharged home.  So we will transfer the patient into [facility] ED discussed with Dr. Adrian Prows their and we are requesting that patient be consulted on by internal medicine for consideration for admission we still feel the patient needs to be admission admitted.  And if they feel he does not require admission they can discharge him home.  Family and patient aware of the situation.  We will arrange for transfer into [facility] ED for internal medicine consultation/admission.    Final Clinical Impression(s) / ED Diagnoses Final diagnoses:  Syncope, unspecified syncope type    Rx / DC Orders ED Discharge Orders    None       Fredia Sorrow, MD 02/10/20 1401    Fredia Sorrow, MD 02/10/20 [phone]  Fredia Sorrow, MD 02/10/20 1614    Fredia Sorrow, MD 02/10/20 [phone]

## 2020-02-10 NOTE — ED Notes (Signed)
ED Provider at bedside. 

## 2020-02-10 NOTE — ED Notes (Signed)
Pt arrived into room 14 from triage on a stretcher. Pt appearing pale, diaphoretic, and lethargic after a syncopal episode. IV obtained and pt placed on monitor. MD at bedside.

## 2020-02-10 NOTE — ED Notes (Signed)
Pt lying in bed, NAD, speaking in full sentences. Pt A&O at this time.

## 2020-02-10 NOTE — ED Triage Notes (Signed)
Pt here from [facility] by [facility] with complaints of having a nasal packing and had a syncopal episode while being triaged.  Pt alert and oriented X4  123/74 P 84  RR 20 95% RA

## 2020-02-10 NOTE — Plan of Care (Signed)

## 2020-02-10 NOTE — H&P (Signed)
History and Physical  Ronald [name] XLK:[id number] DOB: 03-23-1934 DOA: 02/10/2020  Referring physician: Dr. Ashok Cordia PCP: Colon Branch, MD  Outpatient Specialists: GI, cardiology Patient coming from: Home through [facility] ED  Chief Complaint: Syncope while in the ED waiting room at [facility]  HPI: Ronald [name] is a 85 y.o. male with medical history significant for essential hypertension, paroxysmal A. fib on Eliquis, prior epistaxis 2 years ago, GERD, diet-controlled diabetes, chronic bilateral lower extremity edema, who initially presented to [facility] ED for follow-up of right nasal packing placed on 02/09/2020 was asked to return 24 hours later.  While in the ED waiting room, patient had a syncopal episode.  He endorses that he felt nauseous without dizziness or diaphoresis the next thing he remembers people were rushing to him.  No prior history of syncope or seizures.  Denies any headaches or head trauma.  Endorses polyuria without dysuria.  Orthostatic vital signs were positive.  EKG in sinus tachycardia.  Troponin negative.  UA pending.  EDP requested admission for overnight observation and syncopal work-up.  ED Course: Afebrile.  Positive orthostatic vital signs.  Lab studies remarkable for leukocytosis 14.3K and neutrophilia. UA pending.  CXR mild atelectasis/scarring of the right lung base.  Review of Systems: Review of systems as noted in the HPI. All other systems reviewed and are negative.   Past Medical History:  Diagnosis Date  . Arthritis    one finger  . BPH (benign prostatic hypertrophy)    f/u @ Biopsy, s/p bx 2009 apro (-)  . Cataract   . Colon polyp   . Complication of anesthesia    violent upon waking up (only 1 time)   . Diverticulosis   . Dysrhythmia    A-flutter/Afib  . Early cataract    left  . Edema leg    asymetric , L > R , [place] neg for DVT 09-2010  . Gastric polyps   . GERD (gastroesophageal reflux disease)   . Hemorrhoids   . History of kidney stones   . Hypercalcemia     parathyroidectomy 04/2008  . Hyperlipidemia   . Hypertension   . Iron deficiency anemia   . Polyp, stomach   . Pre-diabetes   . Wears dentures   . Wears glasses    Past Surgical History:  Procedure Laterality Date  . A-FLUTTER ABLATION N/A 08/12/2017   Procedure: A-FLUTTER ABLATION;  Surgeon: Deboraha Sprang, MD;  Location: [place] [facility];  Service: Cardiovascular;  Laterality: N/A;  . APPENDECTOMY    . BIOPSY  09/20/2018   Procedure: BIOPSY;  Surgeon: Rush Landmark Telford Nab., MD;  Location: [place];  Service: Gastroenterology;;  . CATARACT EXTRACTION Right   . COLONOSCOPY  12/16/2011   Procedure: COLONOSCOPY;  Surgeon: Inda Castle, MD;  Location: [facility];  Service: Endoscopy;  Laterality: N/A;  . ENDOSCOPIC MUCOSAL RESECTION N/A 09/20/2018   Procedure: ENDOSCOPIC MUCOSAL RESECTION;  Surgeon: Rush Landmark Telford Nab., MD;  Location: [place];  Service: Gastroenterology;  Laterality: N/A;  . ENDOSCOPIC MUCOSAL RESECTION  01/31/2019   Procedure: ENDOSCOPIC MUCOSAL RESECTION;  Surgeon: Rush Landmark Telford Nab., MD;  Location: [facility]COPY;  Service: Gastroenterology;;  . ESOPHAGOGASTRODUODENOSCOPY N/A 01/17/2013   Procedure: ESOPHAGOGASTRODUODENOSCOPY (EGD);  Surgeon: Inda Castle, MD;  Location: [facility];  Service: Endoscopy;  Laterality: N/A;  . ESOPHAGOGASTRODUODENOSCOPY (EGD) WITH PROPOFOL N/A 09/20/2018   Procedure: ESOPHAGOGASTRODUODENOSCOPY (EGD) WITH PROPOFOL;  Surgeon: Rush Landmark Telford Nab., MD;  Location: [place];  Service: Gastroenterology;  Laterality: N/A;  .  ESOPHAGOGASTRODUODENOSCOPY (EGD) WITH PROPOFOL N/A 01/31/2019   Procedure: ESOPHAGOGASTRODUODENOSCOPY (EGD) WITH PROPOFOL;  Surgeon: Rush Landmark Telford Nab., MD;  Location: [place];  Service: Gastroenterology;  Laterality: N/A;  . EUS N/A 01/31/2019   Procedure: UPPER ENDOSCOPIC ULTRASOUND (EUS) RADIAL;  Surgeon: Rush Landmark Telford Nab., MD;  Location: [place];  Service:  Gastroenterology;  Laterality: N/A;  . EXTERNAL EAR SURGERY  01/2016   BCC removal  . EYE SURGERY  infant   "lazy eye"  . FINE NEEDLE ASPIRATION  01/31/2019   Procedure: FINE NEEDLE ASPIRATION (FNA) LINEAR;  Surgeon: Irving Copas., MD;  Location: [facility] ENDOSCOPY;  Service: Gastroenterology;;  . Otho Darner SIGMOIDOSCOPY  02/09/2012   Procedure: FLEXIBLE SIGMOIDOSCOPY;  Surgeon: Inda Castle, MD;  Location: [facility];  Service: Endoscopy;  Laterality: N/A;  . HEMORRHOID BANDING  02/09/2012   Procedure: HEMORRHOID BANDING;  Surgeon: Inda Castle, MD;  Location: [facility];  Service: Endoscopy;  Laterality: N/A;  . HEMOSTASIS CLIP PLACEMENT  09/20/2018   Procedure: HEMOSTASIS CLIP PLACEMENT;  Surgeon: Irving Copas., MD;  Location: [place];  Service: Gastroenterology;;  . HEMOSTASIS CLIP PLACEMENT  01/31/2019   Procedure: HEMOSTASIS CLIP PLACEMENT;  Surgeon: Irving Copas., MD;  Location: [place];  Service: Gastroenterology;;  . HOT HEMOSTASIS N/A 01/17/2013   Procedure: HOT HEMOSTASIS (ARGON PLASMA COAGULATION/BICAP);  Surgeon: Inda Castle, MD;  Location: [facility];  Service: Endoscopy;  Laterality: N/A;  . KIDNEY STONE SURGERY    . MULTIPLE TOOTH EXTRACTIONS    . PARATHYROIDECTOMY  04/2008  . POLYPECTOMY  09/20/2018   Procedure: POLYPECTOMY;  Surgeon: Mansouraty, Telford Nab., MD;  Location: [place];  Service: Gastroenterology;;  . POLYPECTOMY  01/31/2019   Procedure: POLYPECTOMY;  Surgeon: Irving Copas., MD;  Location: [place];  Service: Gastroenterology;;  . Clide Deutscher  09/20/2018   Procedure: Clide Deutscher;  Surgeon: Irving Copas., MD;  Location: [place];  Service: Gastroenterology;;  . Clide Deutscher  01/31/2019   Procedure: Clide Deutscher;  Surgeon: Irving Copas., MD;  Location: [place];  Service: Gastroenterology;;  . TONSILLECTOMY      Social History:  reports that he quit smoking about  38 years ago. His smoking use included cigarettes. He has a 12.50 pack-year smoking history. He has never used smokeless tobacco. He reports current alcohol use of about 7.0 standard drinks of alcohol per week. He reports that he does not use drugs.   Allergies  Allergen Reactions  . Cucumber Extract Nausea And Vomiting  . Sulfonamide Derivatives Other (See Comments)    "don't know reaction" per pt    Family History  Problem Relation Age of Onset  . Heart attack Father 29  . Heart attack Mother   . Pneumonia Sister 6  . Colon cancer Neg Hx   . Prostate cancer Neg Hx   . Diabetes Neg Hx     Prior to Admission medications   Medication Sig Start Date End Date Taking? Authorizing Provider  apixaban (ELIQUIS) 5 MG TABS tablet Take 1 tablet (5 mg total) by mouth 2 (two) times daily. 09/21/19  Yes Deboraha Sprang, MD  benazepril (LOTENSIN) 20 MG tablet Take 1 tablet (20 mg total) by mouth daily. 01/23/20  Yes Paz, Alda Berthold, MD  cholecalciferol (VITAMIN D) 1000 UNITS tablet Take 1,000 Units by mouth every evening.    Yes [provider]  ferrous sulfate 325 (65 FE) MG tablet Take 325 mg by mouth daily with breakfast.   Yes [provider]  flecainide (TAMBOCOR)  100 MG tablet TAKE 1 TABLET BY MOUTH ONCE DAILY IN THE EVENING Patient taking differently: Take 100 mg by mouth at bedtime.  11/16/19  Yes Deboraha Sprang, MD  metoprolol succinate (TOPROL-XL) 50 MG 24 hr tablet Take 1 tablet by mouth once daily Patient taking differently: Take 50 mg by mouth daily.  11/22/19  Yes Deboraha Sprang, MD  Multiple Vitamin (MULTIVITAMIN WITH MINERALS) TABS tablet Take 1 tablet by mouth daily at 12 noon.   Yes [provider]  NIFEdipine (PROCARDIA XL/NIFEDICAL XL) 60 MG 24 hr tablet Take 1 tablet (60 mg total) by mouth daily. 11/22/19  Yes Colon Branch, MD  omeprazole (PRILOSEC) 40 MG capsule Take 1 capsule (40 mg total) by mouth 2 (two) times daily before a meal. 02/21/19  Yes Paz, Alda Berthold, MD  Saw Palmetto 450 MG CAPS Take 450 mg by mouth every evening.   Yes [provider]  simvastatin (ZOCOR) 40 MG tablet Take 1 tablet (40 mg total) by mouth at bedtime. 09/01/19  Yes Colon Branch, MD  flecainide (TAMBOCOR) 50 MG tablet Take 1 tablet (50 mg) by mouth once daily in the morning 11/16/19   Deboraha Sprang, MD  furosemide (LASIX) 40 MG tablet Take 1 tablet by mouth once daily Patient taking differently: Take 40 mg by mouth daily.  05/10/19   Deboraha Sprang, MD  Multiple Vitamins-Minerals (PRESERVISION AREDS 2) CAPS Take 1 capsule by mouth every evening.     [provider]    Physical Exam: BP 136/70 (BP Location: Left Arm)   Pulse 76 Comment: TAKEN 17:15  Temp 97.8 F (36.6 C) (Oral)   Resp 18 Comment: TAKEN 17:15  Ht 5\' 8"  (1.727 m)   Wt 88 kg   SpO2 98% Comment: TAKEN 17:15  BMI 29.50 kg/m   . General: 85 y.o. year-old male well developed well nourished in no acute distress.  Alert and oriented x3. . Cardiovascular: Regular rate and rhythm with no rubs or gallops.  No thyromegaly or JVD noted.  Bilateral lower extremity edema. Marland Kitchen Respiratory: Clear to auscultation with no wheezes or rales. Good inspiratory effort.  Right nasal packing in place. . Abdomen: Soft nontender nondistended with normal bowel sounds x4 quadrants. . Muskuloskeletal: No cyanosis or clubbing.  Bilateral lower extremity edema noted. . Neuro: CN II-XII intact, strength, sensation, reflexes . Skin: No ulcerative lesions noted or rashes . Psychiatry: Judgement and insight appear normal. Mood is appropriate for condition and setting          Labs on Admission:  Basic Metabolic Panel: Recent Labs  Lab 02/10/20 1147  NA 136  K 4.2  CL 101  CO2 26  GLUCOSE 132*  BUN 28*  CREATININE 1.25*  CALCIUM 8.2*   Liver Function Tests: Recent Labs  Lab 02/10/20 1147  AST 18  ALT 16  ALKPHOS 49  BILITOT 0.8  PROT 6.3*  ALBUMIN 3.2*   No results for input(s): LIPASE, AMYLASE in  the last 168 hours. No results for input(s): AMMONIA in the last 168 hours. CBC: Recent Labs  Lab 02/10/20 1109  WBC 14.3*  NEUTROABS 11.2*  HGB 15.6  HCT 44.8  MCV 92.4  PLT 192   Cardiac Enzymes: No results for input(s): CKTOTAL, CKMB, CKMBINDEX, TROPONINI in the last 168 hours.  BNP (last 3 results) No results for input(s): BNP in the last 8760 hours.  ProBNP (last 3 results) No results for input(s): PROBNP in the last 8760 hours.  CBG: Recent Labs  Lab 02/10/20 1110  GLUCAP 136*    Radiological Exams on Admission: DG Chest Port 1 View  Result Date: 02/10/2020 CLINICAL DATA:  Syncope EXAM: PORTABLE CHEST 1 VIEW COMPARISON:  May 2019 FINDINGS: Low lung volumes. Mild atelectasis/scarring at the right lung base. No significant pleural effusion. No pneumothorax. Similar cardiomediastinal contours with normal heart size. IMPRESSION: Mild atelectasis/scarring at the right lung base. Electronically Signed   By: Macy Mis M.D.   On: 02/10/2020 12:15    EKG: I independently viewed the EKG done and my findings are as followed: Sinus tachycardia rate of 114.  Nonspecific ST-T changes.  Assessment/Plan Present on Admission: . Syncope  Active Problems:   Syncope  Syncope Possibly vasovagal Admitted for syncope work-up On Eliquis, denies headache or head trauma, holding off on CT head No seizure-like activity, alert and oriented x4, holding off on EEG. Obtain 2D echo, bilateral carotid Doppler ultrasound Positive orthostatic vital signs in the ED Hold off diuretics, he is on p.o. Lasix 40 mg daily Start gentle IV fluid hydration normal saline at 50 cc/h x 1 day Orthostatic vital signs every shift PT OT assessment Fall precaution  Leukocytosis, unclear etiology UA is pending, follow results Chest x-ray unrevealing Obtain procalcitonin Obtain CBC with differential in the morning  Paroxysmal A. fib, currently in sinus tachycardia Obtain TSH He is on Toprol-XL  for rate control, resume He is on Eliquis for primary CVA prevention, resume Monitor on telemetry  History of recent right epistaxis, post nasal packing First recurrence, first episode of epistaxis was 2 years ago where nasal packing was kept for 5 days. Requested to keep for total of 5 days. Start prophylactic antibiotic Augmentin BID x 5 days  Chronic diastolic CHF Last 2D echo done in 2020 revealed normal LVEF with grade 1 diastolic dysfunction Resume cardiac medications Hold off diuretics for now Closely monitor volume status while on gentle IV fluid Start strict I's and O's and daily weight Obtain orthostatic vital signs every shift  History of iron deficiency anemia He is on iron supplement His hemoglobin is currently stable at 15  Essential hypertension BP is currently at goal Resume home regimen Monitor vital signs  GERD Stable Resume home regimen  Hyperlipidemia Resume home Zocor    DVT prophylaxis: Eliquis  Code Status: Full code per the patient himself.  Family Communication: Updated his girlfriend at bedside.  Disposition Plan: Admit to telemetry cardiac  Consults called: None.  Admission status: Observation status.   Status is: Observation    Dispo:  Patient From: Home  Planned Disposition: Home  Expected discharge date: 02/11/20  Medically stable for discharge: No, ongoing management of syncope.   Kayleen Memos MD [facility] Pager [phone]  If 7PM-7AM, please contact night-coverage [email or web address] Password [facility]  02/10/2020, 7:39 PM

## 2020-02-10 NOTE — ED Triage Notes (Signed)
Pt seen here yesterday and had nasal packing placed. Pt states he was told to come back today to have it removed by EDP.  No issues with packing so far.

## 2020-02-10 NOTE — ED Triage Notes (Signed)
Pt stated he felt nauseated.  Pt started to vomit and had syncopal event in triage, when unconcsious. Pale, diaphoretic.  Shallow respirations.  Pt transferred to 14.

## 2020-02-11 ENCOUNTER — Observation Stay (HOSPITAL_BASED_OUTPATIENT_CLINIC_OR_DEPARTMENT_OTHER): Payer: PPO

## 2020-02-11 DIAGNOSIS — I351 Nonrheumatic aortic (valve) insufficiency: Secondary | ICD-10-CM | POA: Diagnosis not present

## 2020-02-11 DIAGNOSIS — R55 Syncope and collapse: Secondary | ICD-10-CM

## 2020-02-11 LAB — BASIC METABOLIC PANEL
Anion gap: 8 (ref 5–15)
BUN: 20 mg/dL (ref 8–23)
CO2: 27 mmol/L (ref 22–32)
Calcium: 8.4 mg/dL — ABNORMAL LOW (ref 8.9–10.3)
Chloride: 105 mmol/L (ref 98–111)
Creatinine, Ser: 1.26 mg/dL — ABNORMAL HIGH (ref 0.61–1.24)
GFR, Estimated: 57 mL/min — ABNORMAL LOW (ref >60)
Glucose, Bld: 105 mg/dL — ABNORMAL HIGH (ref 70–99)
Potassium: 4.3 mmol/L (ref 3.5–5.1)
Sodium: 140 mmol/L (ref 135–145)

## 2020-02-11 LAB — CBC WITH DIFFERENTIAL/PLATELET
Abs Immature Granulocytes: 0.03 10*3/uL (ref 0.00–0.07)
Basophils Absolute: 0 10*3/uL (ref 0.0–0.1)
Basophils Relative: 0 %
Eosinophils Absolute: 0.1 10*3/uL (ref 0.0–0.5)
Eosinophils Relative: 1 %
HCT: 37 % — ABNORMAL LOW (ref 39.0–52.0)
Hemoglobin: 12.3 g/dL — ABNORMAL LOW (ref 13.0–17.0)
Immature Granulocytes: 0 %
Lymphocytes Relative: 17 %
Lymphs Abs: 1.6 10*3/uL (ref 0.7–4.0)
MCH: 31.4 pg (ref 26.0–34.0)
MCHC: 33.2 g/dL (ref 30.0–36.0)
MCV: 94.4 fL (ref 80.0–100.0)
Monocytes Absolute: 0.8 10*3/uL (ref 0.1–1.0)
Monocytes Relative: 8 %
Neutro Abs: 6.8 10*3/uL (ref 1.7–7.7)
Neutrophils Relative %: 74 %
Platelets: 183 10*3/uL (ref 150–400)
RBC: 3.92 MIL/uL — ABNORMAL LOW (ref 4.22–5.81)
RDW: 13 % (ref 11.5–15.5)
WBC: 9.3 10*3/uL (ref 4.0–10.5)
nRBC: 0 % (ref 0.0–0.2)

## 2020-02-11 LAB — ECHOCARDIOGRAM COMPLETE
Area-P 1/2: 3.5 cm2
Calc EF: 66 %
Height: 68 in
P 1/2 time: 767 msec
S' Lateral: 4.3 cm
Single Plane A2C EF: 69.9 %
Single Plane A4C EF: 64.2 %
Weight: 3081.6 oz

## 2020-02-11 LAB — MAGNESIUM: Magnesium: 2 mg/dL (ref 1.7–2.4)

## 2020-02-11 LAB — PROCALCITONIN: Procalcitonin: <0.1 ng/mL

## 2020-02-11 MED ORDER — AMOXICILLIN-POT CLAVULANATE 875-125 MG PO TABS: 1.0000 | ORAL_TABLET | Freq: Two times a day (BID) | ORAL | 0 refills | Status: AC

## 2020-02-11 MED ORDER — APIXABAN 5 MG PO TABS: 5.0000 mg | ORAL_TABLET | Freq: Two times a day (BID) | ORAL | 5 refills | Status: DC

## 2020-02-11 MED ORDER — PERFLUTREN LIPID MICROSPHERE
1.0000 mL | INTRAVENOUS | Status: AC | PRN
  Administered 2020-02-11: 2 mL via INTRAVENOUS
  Filled 2020-02-11: qty 10

## 2020-02-11 MED ORDER — AMOXICILLIN-POT CLAVULANATE 875-125 MG PO TABS
1.0000 | ORAL_TABLET | Freq: Two times a day (BID) | ORAL | Status: DC
  Administered 2020-02-11: 1 via ORAL
  Filled 2020-02-11: qty 1

## 2020-02-11 NOTE — Evaluation (Signed)
Physical Therapy Evaluation Patient Details Name: Ronald [name] MRN: [id number] DOB: [birth year] Today's Date: 02/11/2020   History of Present Illness  Ronald [name] is a 85 y.o. male with medical history significant for essential hypertension, paroxysmal A. fib on Eliquis, prior epistaxis 2 years ago, GERD, diet-controlled diabetes, chronic bilateral lower extremity edema, who initially presented to [facility] ED for follow-up of right nasal packing placed on 02/09/2020 was asked to return 24 hours later.  While in the ED waiting room, patient had a syncopal episode.  He endorses that he felt nauseous without dizziness or diaphoresis the next thing he remembers people were rushing to him.  No prior history of syncope or seizures.  Clinical Impression  Pt admitted with above diagnosis. Pt was able to ambulate without device in hallway with min guard to supervision.  Should progress well. Had no dizziness and BP's were not significant for orthostasis.  Will follow acutely.  Pt currently with functional limitations due to the deficits listed below (see PT Problem List). Pt will benefit from skilled PT to increase their independence and safety with mobility to allow discharge to the venue listed below.      02/11/20 1100  Orthostatic Lying   BP- Lying 124/71  Pulse- Lying 66  Orthostatic Sitting  BP- Sitting 124/72  Pulse- Sitting 70  Orthostatic Standing at 0 minutes  BP- Standing at 0 minutes 104/74  Pulse- Standing at 0 minutes 81  Orthostatic Standing at 3 minutes  BP- Standing at 3 minutes 99/72  Pulse- Standing at 3 minutes 82    Follow Up Recommendations No PT follow up    Equipment Recommendations  None recommended by PT    Recommendations for Other Services       Precautions / Restrictions Precautions Precautions: Fall Restrictions Weight Bearing Restrictions: No      Mobility  Bed Mobility Overal bed mobility: Needs Assistance Bed Mobility: Supine to Sit     Supine to sit:  Independent          Transfers Overall transfer level: Needs assistance Equipment used: None Transfers: Sit to/from Stand Sit to Stand: Supervision            Ambulation/Gait Ambulation/Gait assistance: Supervision;Min guard Gait Distance (Feet): 400 Feet Assistive device: None Gait Pattern/deviations: Step-through pattern;Decreased stride length   Gait velocity interpretation: <1.8 ft/sec, indicate of risk for recurrent falls General Gait Details: Pt overall with steady gait with occasional imbalance with ambulation in hallway needing occasional single UE support.  Pt balance improved as he continued walking.   Stairs            Wheelchair Mobility    Modified Rankin (Stroke Patients Only)       Balance Overall balance assessment: Needs assistance Sitting-balance support: No upper extremity supported;Feet supported Sitting balance-Leahy Scale: Fair     Standing balance support: During functional activity;Single extremity supported;No upper extremity supported Standing balance-Leahy Scale: Fair Standing balance comment: can stand statically without UE support                             Pertinent Vitals/Pain Pain Assessment: No/denies pain    Home Living Family/patient expects to be discharged to:: Private residence Living Arrangements: Alone (stays nights with girlfriend) Available Help at Discharge: Available PRN/intermittently (alone during day) Type of Home: House Home Access: Level entry     Home Layout: Two level;Bed/bath upstairs (at girlfriends home) Home Equipment: Grab bars -  tub/shower      Prior Function Level of Independence: Independent               Hand Dominance   Dominant Hand: Right    Extremity/Trunk Assessment   Upper Extremity Assessment Upper Extremity Assessment: Defer to OT evaluation    Lower Extremity Assessment Lower Extremity Assessment: Generalized weakness    Cervical / Trunk  Assessment Cervical / Trunk Assessment: Normal  Communication   Communication: No difficulties  Cognition Arousal/Alertness: Awake/alert Behavior During Therapy: WFL for tasks assessed/performed Overall Cognitive Status: Within Functional Limits for tasks assessed                                        General Comments      Exercises     Assessment/Plan    PT Assessment Patient needs continued PT services  PT Problem List Decreased activity tolerance;Decreased balance;Decreased mobility;Decreased knowledge of use of DME;Decreased safety awareness       PT Treatment Interventions DME instruction;Gait training;Functional mobility training;Stair training;Therapeutic activities;Therapeutic exercise;Balance training;Patient/family education    PT Goals (Current goals can be found in the Care Plan section)  Acute Rehab PT Goals Patient Stated Goal: to go home PT Goal Formulation: With patient Time For Goal Achievement: 02/25/20 Potential to Achieve Goals: Good    Frequency Min 3X/week   Barriers to discharge        Co-evaluation               AM-PAC PT "6 Clicks" Mobility  Outcome Measure Help needed turning from your back to your side while in a flat bed without using bedrails?: None Help needed moving from lying on your back to sitting on the side of a flat bed without using bedrails?: None Help needed moving to and from a bed to a chair (including a wheelchair)?: A Little Help needed standing up from a chair using your arms (e.g., wheelchair or bedside chair)?: A Little Help needed to walk in hospital room?: A Little Help needed climbing 3-5 steps with a railing? : A Little 6 Click Score: 20    End of Session Equipment Utilized During Treatment: Gait belt Activity Tolerance: Patient limited by fatigue Patient left: in bed;with call bell/phone within reach Nurse Communication: Mobility status PT Visit Diagnosis: Unsteadiness on feet  (R26.81);Muscle weakness (generalized) (M62.81)    Time: 6808-8110 PT Time Calculation (min) (ACUTE ONLY): 17 min   Charges:   PT Evaluation $PT Eval Moderate Complexity: 1 Mod          Drayk Humbarger W,PT Acute Rehabilitation Services Pager:  [phone]  Office:  [phone]    Denice Paradise 02/11/2020, 2:01 PM

## 2020-02-11 NOTE — Discharge Summary (Signed)
Physician Discharge Summary  MAHMOOD BOEHRINGER HKV:[id number] DOB: 01-Jun-1934 DOA: 02/10/2020  PCP: Colon Branch, MD  Admit date: 02/10/2020 Discharge date: 02/11/2020  Admitted From: Home Discharge disposition: Home   Code Status: Full Code  Diet Recommendation: Cardiac diet  Discharge Diagnosis:   Active Problems:   Syncope   History of Present Illness / Brief narrative:  Ronald [name] is a 85 y.o. male with PMH significant for DM 2, HTN, bilateral lower extremity edema, paroxysmal A. Fib, prior history of epistaxis, GERD. Patient initially presented to ED on 12/2 for epistaxis.  He had nasal packing done and was asked to follow-up the next day. On 02/10/2020, patient returned to the ED as advised.  While in the waiting room, patient had a syncopal episode.  Patient felt nauseous, started to vomit, became pale, diaphoretic and passed out.  Next he remembers is him being surrounded by people. No prior history of syncope or seizures.  He endorsed polyuria without dysuria.  In the ED, patient was afebrile, orthostatic vital signs were positive per admission H&P.   EKG with sinus tachycardia.  Troponin negative. Patient was kept under observation for evaluation of syncope.  Subjective:  Seen and examined this morning.  Pleasant elderly Caucasian male.  Has nasal packing on the right nare.  No active bleeding. He got up and walked to the bathroom earlier without dizziness.  Hospital Course:  Syncope -Possibly vasovagal. -Per admission H&P, patient had positive orthostatic vital signs in the ED. -patient is orthostatic vital signs were positive this morning as well. On standing up his blood pressure dropped from 564 systolic to 99 systolic. Patient however did not feel any symptoms. I offered him an overnight stay and IV hydration. Patient clearly wants to go home. Discussed this with his girlfriend at bedside and made a decision to go home. -Echocardiogram with EF 60-65%, no wma. -Carotid  duplex with bilateral nonsignificant stenosis.  Epistaxis/leukocytosis -Underwent nasal packing on 12/2. -First recurrence, first episode of epistaxis was 2 years ago where nasal packing was kept for 5 days. -I discussed the case with ENT on-call Dr. Marcelline Deist. Because of the fact that patient is in Eliquis, he needs to keep the packing on for 5 to 7 days. Dr. Arbie Cookey will suggest to hold her Eliquis for the duration. -Patient will follow up in the ED or at ENTs office after 5 days of packing for removal. -Started on prophylactic antibioticAugmentin BID x5days. Recent Labs  Lab 02/10/20 1109 02/11/20 0339  WBC 14.3* 9.3  PROCALCITON  --  <0.10   Paroxysmal A. fib,  -currentlyinsinus rhythm. -Home meds include Toprol 50 mg daily, flecainide 100 mg at bedtime, Eliquis 5 mg twice daily, -Continue Toprol and flecainide. -Eliquis to be on hold for next 5 to 7 days until nasal packing is out per ENT recommendation -Continue to monitor in telemetry.  Chronic diastolic CHF Essential hypertension -Last 2D echo done in 2020 revealed normal LVEF with grade 1 diastolic dysfunction -currently blood pressure is stable on Toprol only. -Continue Toprol. Benazepril and nifedipine on hold. Patient states that he is not on Lasix anymore. -Continue to monitor blood pressure at home. Resume benazepril and nifedipine only if blood pressure is over 140/90.  Hyperlipidemia -Continue Zocor  History of iron deficiency anemia GERD -Continue Protonix and iron supplement.  Stable for discharge to home today.  Wound care:    Discharge Exam:   Vitals:   02/10/20 2215 02/11/20 0042 02/11/20 0346 02/11/20 0626  BP:  130/79 136/78 120/79   Pulse: 74 70 84   Resp: 20 18 18    Temp: 99 F (37.2 C) (!) 97.4 F (36.3 C) 98 F (36.7 C)   TempSrc: Oral Oral Oral   SpO2: 98% 98% 96%   Weight: 88.8 kg   87.4 kg  Height: 5\' 8"  (1.727 m)       Body mass index is 29.28 kg/m.  General exam:  Pleasant, elderly Caucasian male. Not in distress Skin: No rashes, lesions or ulcers. HEENT: Atraumatic, normocephalic, no obvious bleeding. Nasal packing present on right nare Lungs: Clear to auscultation bilaterally CVS: Regular rate and rhythm, no murmur GI/Abd soft, nontender, nondistended, bowel sound present CNS: Alert, awake, oriented x3 Psychiatry: Mood appropriate Extremities: No pedal edema, no calf tenderness  Follow ups:   Discharge Instructions    Ambulatory referral to ENT   Complete by: As directed    Discharge instructions   Complete by: As directed    Blood pressure meds -your blood pressure is low. Continue metoprolol but keep benazepril and amlodipine on hold. Monitor blood pressure at home. If systolic blood pressure is over 140, you can resume on blood pressure medicine at the time Maintain a consistent amount of fluid intake every day. 50-60 ounces a day nasal bleeding -keep Eliquis on hold till you see an ENT doctor or return back to ED in next 5 days. Continue antibiotics for 5 days.   Increase activity slowly   Complete by: As directed       Follow-up Information    Colon Branch, MD Follow up.   Specialty: Internal Medicine Contact information: [address] [place] [address] [phone]        Deboraha Sprang, MD .   Specialty: Cardiology Contact information: [address] [address] [phone]               Recommendations for Outpatient Follow-Up:   1. Follow-up with PCP as an outpatient 2. follow-up with ENT as an outpatient  Discharge Instructions:  Follow with Primary MD Colon Branch, MD in 7 days   Get CBC/BMP checked in next visit within 1 week by PCP or SNF MD ( we routinely change or add medications that can affect your baseline labs and fluid status, therefore we recommend that you get the mentioned basic workup next visit with your PCP, your PCP may decide not to get them or  add new tests based on their clinical decision)  On your next visit with your PCP, please Get Medicines reviewed and adjusted.  Please request your PCP  to go over all Hospital Tests and Procedure/Radiological results at the follow up, please get all Hospital records sent to your Prim MD by signing hospital release before you go home.  Activity: As tolerated with Full fall precautions use walker/cane & assistance as needed  For Heart failure patients - Check your Weight same time everyday, if you gain over 2 pounds, or you develop in leg swelling, experience more shortness of breath or chest pain, call your Primary MD immediately. Follow Cardiac Low Salt Diet and 1.5 lit/day fluid restriction.  If you have smoked or chewed Tobacco in the last 2 yrs please stop smoking, stop any regular Alcohol  and or any Recreational drug use.  If you experience worsening of your admission symptoms, develop shortness of breath, life threatening emergency, suicidal or homicidal thoughts you must seek medical attention immediately by calling  911 or calling your MD immediately  if symptoms less severe.  You Must read complete instructions/literature along with all the possible adverse reactions/side effects for all the Medicines you take and that have been prescribed to you. Take any new Medicines after you have completely understood and accpet all the possible adverse reactions/side effects.   Do not drive, operate heavy machinery, perform activities at heights, swimming or participation in water activities or provide baby sitting services if your were admitted for syncope or siezures until you have seen by Primary MD or a Neurologist and advised to do so again.  Do not drive when taking Pain medications.  Do not take more than prescribed Pain, Sleep and Anxiety Medications  Wear Seat belts while driving.   Please note You were cared for by a hospitalist during your hospital stay. If you have any questions  about your discharge medications or the care you received while you were in the hospital after you are discharged, you can call the unit and asked to speak with the hospitalist on call if the hospitalist that took care of you is not available. Once you are discharged, your primary care physician will handle any further medical issues. Please note that NO REFILLS for any discharge medications will be authorized once you are discharged, as it is imperative that you return to your primary care physician (or establish a relationship with a primary care physician if you do not have one) for your aftercare needs so that they can reassess your need for medications and monitor your lab values.    Allergies as of 02/11/2020      Reactions   Cucumber Extract Nausea And Vomiting   Sulfonamide Derivatives Other (See Comments)   Unknown reaction      Medication List    STOP taking these medications   furosemide 40 MG tablet Commonly known as: LASIX     TAKE these medications   amoxicillin-clavulanate 875-125 MG tablet Commonly known as: AUGMENTIN Take 1 tablet by mouth every 12 (twelve) hours for 5 days.   apixaban 5 MG Tabs tablet Commonly known as: ELIQUIS Take 1 tablet (5 mg total) by mouth 2 (two) times daily. Start taking on: February 15, 2020 What changed: These instructions start on February 15, 2020. If you are unsure what to do until then, ask your doctor or other care provider.   benazepril 20 MG tablet Commonly known as: LOTENSIN Take 1 tablet (20 mg total) by mouth daily.   cholecalciferol 1000 units tablet Commonly known as: VITAMIN D Take 1,000 Units by mouth every evening.   ferrous sulfate 325 (65 FE) MG tablet Take 325 mg by mouth daily with breakfast.   flecainide 100 MG tablet Commonly known as: TAMBOCOR TAKE 1 TABLET BY MOUTH ONCE DAILY IN THE EVENING What changed:   how much to take  how to take this  when to take this  additional instructions   flecainide 50  MG tablet Commonly known as: TAMBOCOR Take 1 tablet (50 mg) by mouth once daily in the morning What changed:   how much to take  how to take this  when to take this  additional instructions   metoprolol succinate 50 MG 24 hr tablet Commonly known as: TOPROL-XL Take 1 tablet by mouth once daily   multivitamin with minerals Tabs tablet Take 1 tablet by mouth daily at 12 noon.   NIFEdipine 60 MG 24 hr tablet Commonly known as: PROCARDIA XL/NIFEDICAL XL Take 1 tablet (60 mg  total) by mouth daily.   omeprazole 40 MG capsule Commonly known as: PriLOSEC Take 1 capsule (40 mg total) by mouth 2 (two) times daily before a meal.   Saw Palmetto 450 MG Caps Take 450 mg by mouth every evening.   simvastatin 40 MG tablet Commonly known as: ZOCOR Take 1 tablet (40 mg total) by mouth at bedtime.       Time coordinating discharge: 35 minutes  The results of significant diagnostics from this hospitalization (including imaging, microbiology, ancillary and laboratory) are listed below for reference.    Procedures and Diagnostic Studies:   DG Chest Port 1 View  Result Date: 02/10/2020 CLINICAL DATA:  Syncope EXAM: PORTABLE CHEST 1 VIEW COMPARISON:  May 2019 FINDINGS: Low lung volumes. Mild atelectasis/scarring at the right lung base. No significant pleural effusion. No pneumothorax. Similar cardiomediastinal contours with normal heart size. IMPRESSION: Mild atelectasis/scarring at the right lung base. Electronically Signed   By: Macy Mis M.D.   On: 02/10/2020 12:15   VAS US CAROTID  Result Date: 02/11/2020 Carotid Arterial Duplex Study Indications:       Syncope. Risk Factors:      Hypertension, hyperlipidemia. Comparison Study:  No prior study Performing Technologist: Maudry Mayhew MHA, RDMS, RVT, RDCS  Examination Guidelines: A complete evaluation includes B-mode imaging, spectral Doppler, color Doppler, and power Doppler as needed of all accessible portions of each vessel.  Bilateral testing is considered an integral part of a complete examination. Limited examinations for reoccurring indications may be performed as noted.  Right Carotid Findings: +----------+--------+--------+--------+-----------------------+--------+           PSV cm/sEDV cm/sStenosisPlaque Description     Comments +----------+--------+--------+--------+-----------------------+--------+ CCA Prox  99      17                                              +----------+--------+--------+--------+-----------------------+--------+ CCA Distal98      18                                              +----------+--------+--------+--------+-----------------------+--------+ ICA Prox  100     18              smooth and heterogenous         +----------+--------+--------+--------+-----------------------+--------+ ICA Distal86      18                                              +----------+--------+--------+--------+-----------------------+--------+ ECA       119     11              focal and irregular             +----------+--------+--------+--------+-----------------------+--------+ +----------+--------+-------+----------------+-------------------+           PSV cm/sEDV cmsDescribe        Arm Pressure (mmHG) +----------+--------+-------+----------------+-------------------+ Subclavian162            Multiphasic, WNL                    +----------+--------+-------+----------------+-------------------+ +---------+--------+--+--------+--+---------+ VertebralPSV cm/s50EDV cm/s10Antegrade +---------+--------+--+--------+--+---------+  Left Carotid Findings: +----------+--------+--------+--------+-----------------------+--------+  PSV cm/sEDV cm/sStenosisPlaque Description     Comments +----------+--------+--------+--------+-----------------------+--------+ CCA Prox  122     21                                               +----------+--------+--------+--------+-----------------------+--------+ CCA Distal75      12              smooth and heterogenous         +----------+--------+--------+--------+-----------------------+--------+ ICA Prox  57      8               smooth and heterogenous         +----------+--------+--------+--------+-----------------------+--------+ ICA Distal66      17                                              +----------+--------+--------+--------+-----------------------+--------+ ECA       121     9               smooth and heterogenous         +----------+--------+--------+--------+-----------------------+--------+ +----------+--------+--------+----------------+-------------------+           PSV cm/sEDV cm/sDescribe        Arm Pressure (mmHG) +----------+--------+--------+----------------+-------------------+ [id number]             Multiphasic, WNL                    +----------+--------+--------+----------------+-------------------+ +---------+--------+--+--------+-+---------+ VertebralPSV cm/s38EDV cm/s7Antegrade +---------+--------+--+--------+-+---------+   Summary: Right Carotid: Velocities in the right ICA are consistent with a 1-39% stenosis. Left Carotid: Velocities in the left ICA are consistent with a 1-39% stenosis. Vertebrals:  Bilateral vertebral arteries demonstrate antegrade flow. Subclavians: Normal flow hemodynamics were seen in bilateral subclavian              arteries. *See table(s) above for measurements and observations.  Electronically signed by Antony Contras MD on 02/11/2020 at 1:28:10 PM.    Final      Labs:   Basic Metabolic Panel: Recent Labs  Lab 02/10/20 1147 02/11/20 0339  NA 136 140  K 4.2 4.3  CL 101 105  CO2 26 27  GLUCOSE 132* 105*  BUN 28* 20  CREATININE 1.25* 1.26*  CALCIUM 8.2* 8.4*  MG  --  2.0   GFR Estimated Creatinine Clearance: 47.8 mL/min (A) (by C-G formula based on SCr of 1.26 mg/dL (H)). Liver  Function Tests: Recent Labs  Lab 02/10/20 1147  AST 18  ALT 16  ALKPHOS 49  BILITOT 0.8  PROT 6.3*  ALBUMIN 3.2*   No results for input(s): LIPASE, AMYLASE in the last 168 hours. No results for input(s): AMMONIA in the last 168 hours. Coagulation profile No results for input(s): INR, PROTIME in the last 168 hours.  CBC: Recent Labs  Lab 02/10/20 1109 02/11/20 0339  WBC 14.3* 9.3  NEUTROABS 11.2* 6.8  HGB 15.6 12.3*  HCT 44.8 37.0*  MCV 92.4 94.4  PLT 192 183   Cardiac Enzymes: No results for input(s): CKTOTAL, CKMB, CKMBINDEX, TROPONINI in the last 168 hours. BNP: Invalid input(s): POCBNP CBG: Recent Labs  Lab 02/10/20 1110  GLUCAP 136*   D-Dimer No results for input(s): DDIMER in the last 72 hours. Hgb A1c Recent Labs  02/10/20 2223  HGBA1C 5.8*   Lipid Profile No results for input(s): CHOL, HDL, LDLCALC, TRIG, CHOLHDL, LDLDIRECT in the last 72 hours. Thyroid function studies Recent Labs    02/10/20 1945  TSH 1.278   Anemia work up No results for input(s): VITAMINB12, FOLATE, FERRITIN, TIBC, IRON, RETICCTPCT in the last 72 hours. Microbiology Recent Results (from the past 240 hour(s))  Resp Panel by RT-PCR (Flu A&B, Covid) Nasopharyngeal Swab     Status: None   Collection Time: 02/10/20 12:03 PM   Specimen: Nasopharyngeal Swab; Nasopharyngeal(NP) swabs in vial transport medium  Result Value Ref Range Status   SARS Coronavirus 2 by RT PCR NEGATIVE NEGATIVE Final    Comment: (NOTE) SARS-CoV-2 target nucleic acids are NOT DETECTED.  The SARS-CoV-2 RNA is generally detectable in upper respiratory specimens during the acute phase of infection. The lowest concentration of SARS-CoV-2 viral copies this assay can detect is 138 copies/mL. A negative result does not preclude SARS-Cov-2 infection and should not be used as the sole basis for treatment or other patient management decisions. A negative result may occur with  improper specimen  collection/handling, submission of specimen other than nasopharyngeal swab, presence of viral mutation(s) within the areas targeted by this assay, and inadequate number of viral copies(<138 copies/mL). A negative result must be combined with clinical observations, patient history, and epidemiological information. The expected result is Negative.  Fact Sheet for Patients:  EntrepreneurPulse.com.au  Fact Sheet for Healthcare Providers:  IncredibleEmployment.be  This test is no t yet approved or cleared by the [place] FDA and  has been authorized for detection and/or diagnosis of SARS-CoV-2 by FDA under an Emergency Use Authorization (EUA). This EUA will remain  in effect (meaning this test can be used) for the duration of the COVID-19 declaration under Section 564(b)(1) of the Act, 21 U.S.C.section 360bbb-3(b)(1), unless the authorization is terminated  or revoked sooner.       Influenza A by PCR NEGATIVE NEGATIVE Final   Influenza B by PCR NEGATIVE NEGATIVE Final    Comment: (NOTE) The Xpert Xpress SARS-CoV-2/FLU/RSV plus assay is intended as an aid in the diagnosis of influenza from Nasopharyngeal swab specimens and should not be used as a sole basis for treatment. Nasal washings and aspirates are unacceptable for Xpert Xpress SARS-CoV-2/FLU/RSV testing.  Fact Sheet for Patients: EntrepreneurPulse.com.au  Fact Sheet for Healthcare Providers: IncredibleEmployment.be  This test is not yet approved or cleared by the [place] FDA and has been authorized for detection and/or diagnosis of SARS-CoV-2 by FDA under an Emergency Use Authorization (EUA). This EUA will remain in effect (meaning this test can be used) for the duration of the COVID-19 declaration under Section 564(b)(1) of the Act, 21 U.S.C. section 360bbb-3(b)(1), unless the authorization is terminated or revoked.  Performed at [facility], [address] [place], [place] [address]      Signed: Terrilee Croak  [facility] 02/11/2020, 4:48 PM

## 2020-02-11 NOTE — Discharge Instructions (Signed)

## 2020-02-11 NOTE — Progress Notes (Signed)
  Echocardiogram 2D Echocardiogram has been performed.  Michiel Cowboy 02/11/2020, 9:58 AM

## 2020-02-11 NOTE — Progress Notes (Signed)
Carotid artery duplex completed. Refer to "CV Proc" under chart review to view preliminary results.  02/11/2020 10:19 AM Kelby Aline., MHA, RVT, RDCS, RDMS

## 2020-02-13 ENCOUNTER — Telehealth: Payer: Self-pay

## 2020-02-13 NOTE — Telephone Encounter (Signed)
Transition Care Management Follow-up Telephone Call  Date of discharge and from where: 02/11/2020-[facility]  How have you been since you were released from the hospital? Feeling fine   Any questions or concerns? No  Items Reviewed:  Did the pt receive and understand the discharge instructions provided? Yes   Medications obtained and verified? Yes   Other? Yes   Any new allergies since your discharge? No   Dietary orders reviewed? Yes  Do you have support at home? Yes   Home Care and Equipment/Supplies: Were home health services ordered? no If so, what is the name of the agency? n/a  Has the agency set up a time to come to the patient's home? not applicable Were any new equipment or medical supplies ordered?  No What is the name of the medical supply agency? n/a Were you able to get the supplies/equipment? not applicable Do you have any questions related to the use of the equipment or supplies? No  Functional Questionnaire: (I = Independent and D = Dependent) ADLs: I  Bathing/Dressing- I  Meal Prep- I  Eating- I  Maintaining continence- I  Transferring/Ambulation- I  Managing Meds- I  Follow up appointments reviewed:   PCP Hospital f/u appt confirmed? Yes  Scheduled to see Dr. Larose Kells on 02/14/20 @ 2:00.  [facility] f/u appt confirmed? No  left message for ENT office to call patient  Are transportation arrangements needed? No   If their condition worsens, is the pt aware to call PCP or go to the Emergency Dept.? Yes  Was the patient provided with contact information for the PCP's office or ED? Yes  Was to pt encouraged to call back with questions or concerns? Yes

## 2020-02-13 NOTE — Telephone Encounter (Signed)
1st attempt TCM call. No answer. 

## 2020-02-13 NOTE — Telephone Encounter (Signed)
Pt returned phone call.  

## 2020-02-14 ENCOUNTER — Other Ambulatory Visit: Payer: Self-pay

## 2020-02-14 ENCOUNTER — Ambulatory Visit (INDEPENDENT_AMBULATORY_CARE_PROVIDER_SITE_OTHER): Payer: PPO | Admitting: Internal Medicine

## 2020-02-14 VITALS — BP 147/85 | HR 68 | Temp 97.7°F | Ht 68.0 in | Wt 200.0 lb

## 2020-02-14 DIAGNOSIS — R040 Epistaxis: Secondary | ICD-10-CM

## 2020-02-14 DIAGNOSIS — D509 Iron deficiency anemia, unspecified: Secondary | ICD-10-CM | POA: Diagnosis not present

## 2020-02-14 DIAGNOSIS — I10 Essential (primary) hypertension: Secondary | ICD-10-CM | POA: Diagnosis not present

## 2020-02-14 DIAGNOSIS — F419 Anxiety disorder, unspecified: Secondary | ICD-10-CM

## 2020-02-14 NOTE — Progress Notes (Signed)
Subjective:    Patient ID: Ronald [name], male    DOB: [birth year], 85 y.o.   MRN: [id number]  DOS:  02/14/2020 Type of visit - description: Hospital follow-up, TCM Since the last office visit went to the ER twice:  02/09/2020: Seen at the ER with right sided nosebleed, treated.  12/3/2021Martin Majestic to the ER for epistaxis recheck, in the waiting room he had a syncopal event and was admitted to the hospital, Discharged the next day:  The patient reported that the date of the syncope he was fasting, did not eat or drink anything. Upon admission, orthostatic VS were positive. EKG showed sinus tachycardia. For nosebleeds, hospitalist talk with ENT, they recommended to leave the packing in place for 5 to 7 days because he is anticoagulated. The morning of discharge, vital signs continued to be somewhat orthostatic, the patient declined to be kept an additional day. Labs: BMP was okay, CBC: Hemoglobin decreased from baseline around 15 to 12.3. He was recommended to hold Eliquis, benazepril.   Review of Systems Since the ER visit, no further nose bleeding. His emotionally very disturbed about the whole situation. He was not eating or drinking much but is forcing himself to do. He reports no headaches. No dizziness Some nausea but no vomiting or diarrhea. Stools have been dark, he thinks due to swallowing blood. No abdominal pain  Past Medical History:  Diagnosis Date  . Arthritis    one finger  . BPH (benign prostatic hypertrophy)    f/u @ Biopsy, s/p bx 2009 apro (-)  . Cataract   . Colon polyp   . Complication of anesthesia    violent upon waking up (only 1 time)   . Diverticulosis   . Dysrhythmia    A-flutter/Afib  . Early cataract    left  . Edema leg    asymetric , L > R , [place] neg for DVT 09-2010  . Gastric polyps   . GERD (gastroesophageal reflux disease)   . Hemorrhoids   . History of kidney stones   . Hypercalcemia    parathyroidectomy 04/2008  . Hyperlipidemia   .  Hypertension   . Iron deficiency anemia   . Polyp, stomach   . Pre-diabetes   . Wears dentures   . Wears glasses     Past Surgical History:  Procedure Laterality Date  . A-FLUTTER ABLATION N/A 08/12/2017   Procedure: A-FLUTTER ABLATION;  Surgeon: Deboraha Sprang, MD;  Location: [place] [facility];  Service: Cardiovascular;  Laterality: N/A;  . APPENDECTOMY    . BIOPSY  09/20/2018   Procedure: BIOPSY;  Surgeon: Rush Landmark Telford Nab., MD;  Location: [place];  Service: Gastroenterology;;  . CATARACT EXTRACTION Right   . COLONOSCOPY  12/16/2011   Procedure: COLONOSCOPY;  Surgeon: Inda Castle, MD;  Location: [facility];  Service: Endoscopy;  Laterality: N/A;  . ENDOSCOPIC MUCOSAL RESECTION N/A 09/20/2018   Procedure: ENDOSCOPIC MUCOSAL RESECTION;  Surgeon: Rush Landmark Telford Nab., MD;  Location: [place];  Service: Gastroenterology;  Laterality: N/A;  . ENDOSCOPIC MUCOSAL RESECTION  01/31/2019   Procedure: ENDOSCOPIC MUCOSAL RESECTION;  Surgeon: Rush Landmark Telford Nab., MD;  Location: [facility];  Service: Gastroenterology;;  . ESOPHAGOGASTRODUODENOSCOPY N/A 01/17/2013   Procedure: ESOPHAGOGASTRODUODENOSCOPY (EGD);  Surgeon: Inda Castle, MD;  Location: [facility];  Service: Endoscopy;  Laterality: N/A;  . ESOPHAGOGASTRODUODENOSCOPY (EGD) WITH PROPOFOL N/A 09/20/2018   Procedure: ESOPHAGOGASTRODUODENOSCOPY (EGD) WITH PROPOFOL;  Surgeon: Rush Landmark Telford Nab., MD;  Location: [place];  Service: Gastroenterology;  Laterality: N/A;  . ESOPHAGOGASTRODUODENOSCOPY (EGD) WITH PROPOFOL N/A 01/31/2019   Procedure: ESOPHAGOGASTRODUODENOSCOPY (EGD) WITH PROPOFOL;  Surgeon: Rush Landmark Telford Nab., MD;  Location: [place];  Service: Gastroenterology;  Laterality: N/A;  . EUS N/A 01/31/2019   Procedure: UPPER ENDOSCOPIC ULTRASOUND (EUS) RADIAL;  Surgeon: Rush Landmark Telford Nab., MD;  Location: [place];  Service: Gastroenterology;  Laterality: N/A;  . EXTERNAL EAR SURGERY   01/2016   BCC removal  . EYE SURGERY  infant   "lazy eye"  . FINE NEEDLE ASPIRATION  01/31/2019   Procedure: FINE NEEDLE ASPIRATION (FNA) LINEAR;  Surgeon: Irving Copas., MD;  Location: [facility];  Service: Gastroenterology;;  . Otho Darner SIGMOIDOSCOPY  02/09/2012   Procedure: FLEXIBLE SIGMOIDOSCOPY;  Surgeon: Inda Castle, MD;  Location: [facility];  Service: Endoscopy;  Laterality: N/A;  . HEMORRHOID BANDING  02/09/2012   Procedure: HEMORRHOID BANDING;  Surgeon: Inda Castle, MD;  Location: [facility];  Service: Endoscopy;  Laterality: N/A;  . HEMOSTASIS CLIP PLACEMENT  09/20/2018   Procedure: HEMOSTASIS CLIP PLACEMENT;  Surgeon: Irving Copas., MD;  Location: [place];  Service: Gastroenterology;;  . HEMOSTASIS CLIP PLACEMENT  01/31/2019   Procedure: HEMOSTASIS CLIP PLACEMENT;  Surgeon: Irving Copas., MD;  Location: [place];  Service: Gastroenterology;;  . HOT HEMOSTASIS N/A 01/17/2013   Procedure: HOT HEMOSTASIS (ARGON PLASMA COAGULATION/BICAP);  Surgeon: Inda Castle, MD;  Location: [facility];  Service: Endoscopy;  Laterality: N/A;  . KIDNEY STONE SURGERY    . MULTIPLE TOOTH EXTRACTIONS    . PARATHYROIDECTOMY  04/2008  . POLYPECTOMY  09/20/2018   Procedure: POLYPECTOMY;  Surgeon: Mansouraty, Telford Nab., MD;  Location: [place];  Service: Gastroenterology;;  . POLYPECTOMY  01/31/2019   Procedure: POLYPECTOMY;  Surgeon: Irving Copas., MD;  Location: [place];  Service: Gastroenterology;;  . Clide Deutscher  09/20/2018   Procedure: Clide Deutscher;  Surgeon: Irving Copas., MD;  Location: [place];  Service: Gastroenterology;;  . Clide Deutscher  01/31/2019   Procedure: Clide Deutscher;  Surgeon: Irving Copas., MD;  Location: [facility] ENDOSCOPY;  Service: Gastroenterology;;  . TONSILLECTOMY      Allergies as of 02/14/2020      Reactions   Cucumber Extract Nausea And Vomiting   Sulfonamide Derivatives Other  (See Comments)   Unknown reaction      Medication List       Accurate as of February 14, 2020 11:59 PM. If you have any questions, ask your nurse or doctor.        amoxicillin-clavulanate 875-125 MG tablet Commonly known as: AUGMENTIN Take 1 tablet by mouth every 12 (twelve) hours for 5 days.   apixaban 5 MG Tabs tablet Commonly known as: ELIQUIS Take 1 tablet (5 mg total) by mouth 2 (two) times daily.   benazepril 20 MG tablet Commonly known as: LOTENSIN Take 1 tablet (20 mg total) by mouth daily.   cholecalciferol 1000 units tablet Commonly known as: VITAMIN D Take 1,000 Units by mouth every evening.   ferrous sulfate 325 (65 FE) MG tablet Take 325 mg by mouth daily with breakfast.   flecainide 100 MG tablet Commonly known as: TAMBOCOR TAKE 1 TABLET BY MOUTH ONCE DAILY IN THE EVENING What changed:   how much to take  how to take this  when to take this  additional instructions   flecainide 50 MG tablet Commonly known as: TAMBOCOR Take 1 tablet (50 mg) by mouth once daily in the morning What changed:   how much to take  how  to take this  when to take this  additional instructions   metoprolol succinate 50 MG 24 hr tablet Commonly known as: TOPROL-XL Take 1 tablet by mouth once daily   multivitamin with minerals Tabs tablet Take 1 tablet by mouth daily at 12 noon.   NIFEdipine 60 MG 24 hr tablet Commonly known as: PROCARDIA XL/NIFEDICAL XL Take 1 tablet (60 mg total) by mouth daily.   omeprazole 40 MG capsule Commonly known as: PriLOSEC Take 1 capsule (40 mg total) by mouth 2 (two) times daily before a meal.   Saw Palmetto 450 MG Caps Take 450 mg by mouth every evening.   simvastatin 40 MG tablet Commonly known as: ZOCOR Take 1 tablet (40 mg total) by mouth at bedtime.          Objective:   Physical Exam BP (!) 147/85 (BP Location: Left Arm, Patient Position: Sitting, Cuff Size: Large)   Pulse 68   Temp 97.7 F (36.5 C) (Oral)    Ht 5\' 8"  (1.727 m)   Wt 200 lb (90.7 kg)   SpO2 98%   BMI 30.41 kg/m  General:   Well developed, NAD, BMI noted. HEENT:  Normocephalic . Face symmetric, atraumatic. Packing in place Lungs:  CTA B Normal respiratory effort, no intercostal retractions, no accessory muscle use. Heart: RRR,  no murmur.  Lower extremities: no pretibial edema bilaterally  Skin: Not pale. Not jaundice Neurologic:  alert & oriented X3.  Speech normal, gait appropriate for age and unassisted Psych--  Cognition and judgment appear intact.  Cooperative with normal attention span and concentration.  Behavior appropriate. No anxious or depressed appearing.      Assessment     Assessment   DM HTN Hyperlipidemia Osteopenia T score -2.0 (2010), T score -1.6 (12-2014); T score -1.3 (03-2018). 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  PLAN Epistaxis In the context of  anticoagulation, needs pack remove tomorrow or the next day. Bleeding was enough to decrease his hemoglobin, he was orthostatic during the hospital stay. He has checked his orthostatic vital signs several times himself and he has gotten normal and abnormal orthostatic VS.  He does not feel dizzy when he stands up. (Today standing: 124/76, heart rate 79.  Sitting 155/84, heart rate 60) Currently holding anticoagulants. Plan: I called ENT and obtain a follow-up for tomorrow, cont  antibiotics. Anemia: Check CBC, iron ferritin.  Suspect anemia is related to nosebleed rather than a GI issue but advised to be observant about GI symptoms. HTN: Currently on procardia, metoprolol, holding Lotensin and furosemide. Check a BMP restart Lotensin and lasix in few days as long as the BP is okay, okay to start one at the time if so desired. Emotional distress: Quite upset  about the nosebleed, counseled. Time spent 45 minutes due to chart review extensive number of questions answered to the best of my ability RTC 03-2020 for CPX.  Sooner if needed.     This visit occurred during the SARS-CoV-2 public health emergency.  Safety protocols were in place, including screening questions prior to the visit, additional usage of staff PPE, and extensive cleaning of exam room while observing appropriate contact time as indicated for disinfecting solutions.

## 2020-02-14 NOTE — Patient Instructions (Addendum)
You have an appointment tomorrow- 02/15/2020 at [place] at [facility] with Dr. Wilburn Cornelia. Please arrive 15-30 minutes prior to your appointment for any necessary paperwork.   [address], [place] [address] [phone]   Check the  blood pressure daily. BP GOAL is between 110/65 and  135/85. If it is consistently higher or lower, let me know  Okay to go back on Lotensin and lasix  in 3 to 4 days as long as your blood pressure is not low.  It would be okay to go back first on Lotensin and then on Lasix                     Okay to go back on Eliquis in a day or 2 as long as it is okay by ENT   Drink plenty of fluids  Call anytime if severe bleeding, dizziness when you stand up   [place] [facility] : Get the blood work

## 2020-02-15 DIAGNOSIS — R040 Epistaxis: Secondary | ICD-10-CM | POA: Diagnosis not present

## 2020-02-15 DIAGNOSIS — Z7901 Long term (current) use of anticoagulants: Secondary | ICD-10-CM | POA: Diagnosis not present

## 2020-02-15 DIAGNOSIS — J342 Deviated nasal septum: Secondary | ICD-10-CM | POA: Diagnosis not present

## 2020-02-15 LAB — CBC WITH DIFFERENTIAL/PLATELET
Basophils Absolute: 0.1 10*3/uL (ref 0.0–0.1)
Basophils Relative: 0.9 % (ref 0.0–3.0)
Eosinophils Absolute: 0.1 10*3/uL (ref 0.0–0.7)
Eosinophils Relative: 1.5 % (ref 0.0–5.0)
HCT: 39.5 % (ref 39.0–52.0)
Hemoglobin: 13.4 g/dL (ref 13.0–17.0)
Lymphocytes Relative: 13.3 % (ref 12.0–46.0)
Lymphs Abs: 1 10*3/uL (ref 0.7–4.0)
MCHC: 33.8 g/dL (ref 30.0–36.0)
MCV: 93 fl (ref 78.0–100.0)
Monocytes Absolute: 0.7 10*3/uL (ref 0.1–1.0)
Monocytes Relative: 9.4 % (ref 3.0–12.0)
Neutro Abs: 5.7 10*3/uL (ref 1.4–7.7)
Neutrophils Relative %: 74.9 % (ref 43.0–77.0)
Platelets: 216 10*3/uL (ref 150.0–400.0)
RBC: 4.25 Mil/uL (ref 4.22–5.81)
RDW: 13.2 % (ref 11.5–15.5)
WBC: 7.6 10*3/uL (ref 4.0–10.5)

## 2020-02-15 LAB — BASIC METABOLIC PANEL
BUN: 14 mg/dL (ref 6–23)
CO2: 28 mEq/L (ref 19–32)
Calcium: 8.6 mg/dL (ref 8.4–10.5)
Chloride: 104 mEq/L (ref 96–112)
Creatinine, Ser: 1.27 mg/dL (ref 0.40–1.50)
GFR: 52.25 mL/min — ABNORMAL LOW (ref >60.00)
Glucose, Bld: 100 mg/dL — ABNORMAL HIGH (ref 70–99)
Potassium: 4.2 mEq/L (ref 3.5–5.1)
Sodium: 141 mEq/L (ref 135–145)

## 2020-02-15 LAB — FERRITIN: Ferritin: 60.1 ng/mL (ref 22.0–322.0)

## 2020-02-15 LAB — IRON: Iron: 30 ug/dL — ABNORMAL LOW (ref 42–165)

## 2020-02-15 NOTE — Assessment & Plan Note (Signed)
Epistaxis In the context of  anticoagulation, needs pack remove tomorrow or the next day. Bleeding was enough to decrease his hemoglobin, he was orthostatic during the hospital stay. He has checked his orthostatic vital signs several times himself and he has gotten normal and abnormal orthostatic VS. He does not feel dizzy when he stands up. (Today standing: 124/76, heart rate 79.  Sitting 155/84, heart rate 60) Currently holding anticoagulants. Plan: I called ENT and obtain a follow-up for tomorrow, cont  antibiotics. Anemia: Check CBC, iron ferritin.  Suspect anemia is related to nosebleed rather than a GI issue but advised to be observant about GI symptoms. HTN: Currently on procardia, metoprolol, holding Lotensin and furosemide. Check a BMP restart Lotensin and lasix in few days as long as the BP is okay, okay to start one at the time if so desired. Emotional distress: Quite upset about the nosebleed, counseled. Time spent 45 minutes due to chart review extensive number of questions answered to the best of my ability RTC 03-2020 for CPX.  Sooner if needed.

## 2020-02-20 ENCOUNTER — Telehealth: Payer: Self-pay

## 2020-02-20 ENCOUNTER — Other Ambulatory Visit: Payer: Self-pay | Admitting: Internal Medicine

## 2020-02-20 NOTE — Telephone Encounter (Signed)
-----   Message -----  From: Thornton Park, MD  Sent: 02/20/2020  6:40 AM EST  To: Algernon Huxley, RN   Patient is overdue surveillance EGD/EUS with Dr. Rush Landmark as documented in his chart. Please schedule.   Thank you.   ----- Message -----  From: SYSTEM  Sent: 02/20/2020  5:34 AM EST  To: Thornton Park, MD

## 2020-02-21 ENCOUNTER — Other Ambulatory Visit: Payer: Self-pay

## 2020-02-21 ENCOUNTER — Other Ambulatory Visit: Payer: Self-pay | Admitting: Gastroenterology

## 2020-02-21 ENCOUNTER — Telehealth: Payer: Self-pay

## 2020-02-21 DIAGNOSIS — K3189 Other diseases of stomach and duodenum: Secondary | ICD-10-CM

## 2020-02-21 DIAGNOSIS — Z8509 Personal history of malignant neoplasm of other digestive organs: Secondary | ICD-10-CM

## 2020-02-21 NOTE — Telephone Encounter (Signed)
Spoke with the pt and he is currently driving and would like to call back to discuss.

## 2020-02-21 NOTE — Telephone Encounter (Signed)
Pt is requesting a call back from a nurse to discuss EGD

## 2020-02-21 NOTE — Telephone Encounter (Signed)
The EUS has been scheduled for 04/04/20 at [facility] with Dr Rush Landmark -COVID test on 1/22 at 1005 am.  Will send anti coag letter in regards to Eliquis. The pt has been instructed and all information provided to the pt via My Chart per his preference.

## 2020-02-21 NOTE — Telephone Encounter (Signed)
[facility] Pre-operative Risk Assessment     Request for surgical clearance:     Endoscopy Procedure  What type of surgery is being performed?     EUS  When is this surgery scheduled?     04/04/20  What type of clearance is required ?   Pharmacy  Are there any medications that need to be held prior to surgery and how long? Eliquis  Practice name and name of physician performing surgery?      [facility]  What is your office phone and fax number?      Phone- [phone]  [phone]  Anesthesia type (None, local, MAC, general) ?       MAC

## 2020-02-21 NOTE — Telephone Encounter (Signed)
Message sent to [place], [place] scheduler for Dr. Caryl Comes to reach to the pt with anh appt for pre op.

## 2020-02-21 NOTE — Telephone Encounter (Signed)
   Primary Cardiologist: [name], MD  Chart reviewed as part of pre-operative protocol coverage. Because of Zamarion Longest Rickel's past medical history and time since last visit, he will require a follow-up visit in order to better assess preoperative cardiovascular risk. Recently seen in the ER with epistaxis/syncope and held Eliquis. Needs follow up appt.  Pre-op covering staff: - Please schedule appointment and call patient to inform them. If patient already had an upcoming appointment within acceptable timeframe, please add "pre-op clearance" to the appointment notes so provider is aware. - Please contact requesting surgeon's office via preferred method (i.e, phone, fax) to inform them of need for appointment prior to surgery.  If applicable, this message will also be routed to pharmacy pool and/or primary cardiologist for input on holding anticoagulant/antiplatelet agent as requested below so that this information is available to the clearing provider at time of patient's appointment.   Reino Bellis, NP  02/21/2020, 4:51 PM

## 2020-02-22 NOTE — Telephone Encounter (Signed)
Pt has been scheduled to see Tommye Standard, [facility] 03/08/20 @ 10:15 for pre op clearance. Will forward clearance notes to PA for upcoming appt. Will send FYI to requesting office pt has appt. Will remove from the pre op call back pool.

## 2020-02-23 ENCOUNTER — Encounter: Payer: Self-pay | Admitting: Internal Medicine

## 2020-03-08 ENCOUNTER — Ambulatory Visit: Payer: PPO | Admitting: Physician Assistant

## 2020-03-08 ENCOUNTER — Other Ambulatory Visit: Payer: Self-pay

## 2020-03-08 ENCOUNTER — Encounter: Payer: Self-pay | Admitting: Physician Assistant

## 2020-03-08 VITALS — BP 130/70 | HR 63 | Ht 67.5 in | Wt 204.0 lb

## 2020-03-08 DIAGNOSIS — R55 Syncope and collapse: Secondary | ICD-10-CM

## 2020-03-08 DIAGNOSIS — I5022 Chronic systolic (congestive) heart failure: Secondary | ICD-10-CM

## 2020-03-08 DIAGNOSIS — I10 Essential (primary) hypertension: Secondary | ICD-10-CM | POA: Diagnosis not present

## 2020-03-08 DIAGNOSIS — Z01818 Encounter for other preprocedural examination: Secondary | ICD-10-CM | POA: Diagnosis not present

## 2020-03-08 DIAGNOSIS — I951 Orthostatic hypotension: Secondary | ICD-10-CM | POA: Diagnosis not present

## 2020-03-08 DIAGNOSIS — I4819 Other persistent atrial fibrillation: Secondary | ICD-10-CM | POA: Diagnosis not present

## 2020-03-08 DIAGNOSIS — I428 Other cardiomyopathies: Secondary | ICD-10-CM | POA: Diagnosis not present

## 2020-03-08 NOTE — Progress Notes (Signed)
Cardiology Office Note Date:  03/08/2020  Patient ID:  [name], [name] 01-04-1935, MRN [id number] PCP:  Wanda Plump, MD  Cardiologist/Electrophysiologist: Dr. Graciela Husbands    Chief Complaint: pre-op EGD  History of Present Illness: Ronald [name] is a 85 y.o. male with history of HTN, HLD, GERD, Aflutter (ablated 2019), AFib, h/o rate related CM resolved , DM (he tells me pre-DM)  He comes today t be seen for Dr. Graciela Husbands, last seen by him March 2021, at that time mentions some swelling, no symptoms, describes edema as chronic, though had his hold his nifedipine a couple weeks to see if it helped.   He was briefly hospitalized 02/11/20.  He had been to t he ER the day prior for epistaxis and had packing placed, when he returned the day after as instructed for evaluation whle in the waiting room became nauseous, vomiting, appeared pale, diaphoretic and fainted. Had + orthostatic vitals. He was discharged the following day feeling better though still orthostatic, recommended stay and further hydration though he preferred to , planned for ENT f/u. Also treated with antibiotics, planned for temp hold on his Eliquis and instructed to continue his toprol, and only resume hi nifedipine an benazepril only of BP 140/90 or higher  He comes for pre-op evaluation prior to EGD  TODAY He feels well, his EGD is to f/u on his hx of polyps. He does not exercise, though denies any difficulties with his ADLs, his knees bother him on the stairs especially.  We discussed the ER/fainting episode. He says he thinks he got so nauseous because the blood from his nose had been draing down his throat as well and made him sick to his stomach. He recalls feeling like he was going to throw up asked for a "barf bag" and the next thing he knew he was being woken with a bunch of people in the triage room with him, No snse of palpitations or cardiac awareness He has not fainted since no dizzy spells, no near syncope or  orthostatic symptoms.  He denies any kind of CP, no SOB or DOE with his event or otherwise.  Outside of the nose bleeding he will infrequently see a slight amount of blood in his urine (over the years), no bleeding or signs of bleeiding otherwise. He recalls many years ago he was standing for a prolonged period of time got lightheaded and fainted.    RCRI score is one, 0.9% METS is 8.3  AAD Flecainide    Past Medical History:  Diagnosis Date  . Arthritis    one finger  . BPH (benign prostatic hypertrophy)    f/u @ Biopsy, s/p bx 2009 apro (-)  . Cataract   . Colon polyp   . Complication of anesthesia    violent upon waking up (only 1 time)   . Diverticulosis   . Dysrhythmia    A-flutter/Afib  . Early cataract    left  . Edema leg    asymetric , L > R , [place] neg for DVT 09-2010  . Gastric polyps   . GERD (gastroesophageal reflux disease)   . Hemorrhoids   . History of kidney stones   . Hypercalcemia    parathyroidectomy 04/2008  . Hyperlipidemia   . Hypertension   . Iron deficiency anemia   . Polyp, stomach   . Pre-diabetes   . Wears dentures   . Wears glasses     Past Surgical History:  Procedure Laterality Date  .  A-FLUTTER ABLATION N/A 08/12/2017   Procedure: A-FLUTTER ABLATION;  Surgeon: Duke Salvia, MD;  Location: [facility] INVASIVE CV LAB;  Service: Cardiovascular;  Laterality: N/A;  . APPENDECTOMY    . BIOPSY  09/20/2018   Procedure: BIOPSY;  Surgeon: Meridee Score Netty Starring., MD;  Location: [facility] ENDOSCOPY;  Service: Gastroenterology;;  . CATARACT EXTRACTION Right   . COLONOSCOPY  12/16/2011   Procedure: COLONOSCOPY;  Surgeon: Louis Meckel, MD;  Location: [facility] ENDOSCOPY;  Service: Endoscopy;  Laterality: N/A;  . ENDOSCOPIC MUCOSAL RESECTION N/A 09/20/2018   Procedure: ENDOSCOPIC MUCOSAL RESECTION;  Surgeon: Meridee Score Netty Starring., MD;  Location: [facility] ENDOSCOPY;  Service: Gastroenterology;  Laterality: N/A;  . ENDOSCOPIC MUCOSAL RESECTION  01/31/2019   Procedure:  ENDOSCOPIC MUCOSAL RESECTION;  Surgeon: Meridee Score Netty Starring., MD;  Location: [facility] ENDOSCOPY;  Service: Gastroenterology;;  . ESOPHAGOGASTRODUODENOSCOPY N/A 01/17/2013   Procedure: ESOPHAGOGASTRODUODENOSCOPY (EGD);  Surgeon: Louis Meckel, MD;  Location: [facility] ENDOSCOPY;  Service: Endoscopy;  Laterality: N/A;  . ESOPHAGOGASTRODUODENOSCOPY (EGD) WITH PROPOFOL N/A 09/20/2018   Procedure: ESOPHAGOGASTRODUODENOSCOPY (EGD) WITH PROPOFOL;  Surgeon: Meridee Score Netty Starring., MD;  Location: [facility] ENDOSCOPY;  Service: Gastroenterology;  Laterality: N/A;  . ESOPHAGOGASTRODUODENOSCOPY (EGD) WITH PROPOFOL N/A 01/31/2019   Procedure: ESOPHAGOGASTRODUODENOSCOPY (EGD) WITH PROPOFOL;  Surgeon: Meridee Score Netty Starring., MD;  Location: [facility] ENDOSCOPY;  Service: Gastroenterology;  Laterality: N/A;  . EUS N/A 01/31/2019   Procedure: UPPER ENDOSCOPIC ULTRASOUND (EUS) RADIAL;  Surgeon: Meridee Score Netty Starring., MD;  Location: [facility] ENDOSCOPY;  Service: Gastroenterology;  Laterality: N/A;  . EXTERNAL EAR SURGERY  01/2016   BCC removal  . EYE SURGERY  infant   "lazy eye"  . FINE NEEDLE ASPIRATION  01/31/2019   Procedure: FINE NEEDLE ASPIRATION (FNA) LINEAR;  Surgeon: Lemar Lofty., MD;  Location: [facility] ENDOSCOPY;  Service: Gastroenterology;;  . Wenda Low SIGMOIDOSCOPY  02/09/2012   Procedure: FLEXIBLE SIGMOIDOSCOPY;  Surgeon: Louis Meckel, MD;  Location: [facility] ENDOSCOPY;  Service: Endoscopy;  Laterality: N/A;  . HEMORRHOID BANDING  02/09/2012   Procedure: HEMORRHOID BANDING;  Surgeon: Louis Meckel, MD;  Location: [facility] ENDOSCOPY;  Service: Endoscopy;  Laterality: N/A;  . HEMOSTASIS CLIP PLACEMENT  09/20/2018   Procedure: HEMOSTASIS CLIP PLACEMENT;  Surgeon: Lemar Lofty., MD;  Location: [facility] ENDOSCOPY;  Service: Gastroenterology;;  . HEMOSTASIS CLIP PLACEMENT  01/31/2019   Procedure: HEMOSTASIS CLIP PLACEMENT;  Surgeon: Lemar Lofty., MD;  Location: [facility] ENDOSCOPY;  Service: Gastroenterology;;  . HOT HEMOSTASIS  N/A 01/17/2013   Procedure: HOT HEMOSTASIS (ARGON PLASMA COAGULATION/BICAP);  Surgeon: Louis Meckel, MD;  Location: [facility] ENDOSCOPY;  Service: Endoscopy;  Laterality: N/A;  . KIDNEY STONE SURGERY    . MULTIPLE TOOTH EXTRACTIONS    . PARATHYROIDECTOMY  04/2008  . POLYPECTOMY  09/20/2018   Procedure: POLYPECTOMY;  Surgeon: Mansouraty, Netty Starring., MD;  Location: [facility] ENDOSCOPY;  Service: Gastroenterology;;  . POLYPECTOMY  01/31/2019   Procedure: POLYPECTOMY;  Surgeon: Lemar Lofty., MD;  Location: [facility] ENDOSCOPY;  Service: Gastroenterology;;  . Susa Day  09/20/2018   Procedure: Susa Day;  Surgeon: Lemar Lofty., MD;  Location: [facility] ENDOSCOPY;  Service: Gastroenterology;;  . Susa Day  01/31/2019   Procedure: Susa Day;  Surgeon: Lemar Lofty., MD;  Location: [facility] ENDOSCOPY;  Service: Gastroenterology;;  . TONSILLECTOMY      Current Outpatient Medications  Medication Sig Dispense Refill  . apixaban (ELIQUIS) 5 MG TABS tablet Take 1 tablet (5 mg total) by mouth 2 (two) times daily. 60 tablet 5  . cholecalciferol (VITAMIN D) 1000 UNITS tablet Take 1,000  Units by mouth every evening.     . ferrous sulfate 325 (65 FE) MG tablet Take 325 mg by mouth daily with breakfast.    . flecainide (TAMBOCOR) 100 MG tablet TAKE 1 TABLET BY MOUTH ONCE DAILY IN THE EVENING 90 tablet 1  . flecainide (TAMBOCOR) 50 MG tablet Take 50 mg by mouth daily before breakfast.    . metoprolol succinate (TOPROL-XL) 50 MG 24 hr tablet Take 1 tablet by mouth once daily 90 tablet 1  . Multiple Vitamin (MULTIVITAMIN WITH MINERALS) TABS tablet Take 1 tablet by mouth daily at 12 noon.    Marland Kitchen NIFEdipine (PROCARDIA XL/NIFEDICAL XL) 60 MG 24 hr tablet Take 1 tablet (60 mg total) by mouth daily. 90 tablet 1  . omeprazole (PRILOSEC) 40 MG capsule TAKE 1 CAPSULE BY MOUTH TWICE DAILY BEFORE MEAL(S) 180 capsule 0  . Saw Palmetto 450 MG CAPS Take 450 mg by mouth every evening.    . simvastatin (ZOCOR)  40 MG tablet TAKE 1 TABLET BY MOUTH AT BEDTIME 90 tablet 0   No current facility-administered medications for this visit.    Allergies:   Cucumber extract and Sulfonamide derivatives   Social History:  The patient  reports that he quit smoking about 38 years ago. His smoking use included cigarettes. He has a 12.50 pack-year smoking history. He has never used smokeless tobacco. He reports current alcohol use of about 7.0 standard drinks of alcohol per week. He reports that he does not use drugs.   Family History:  The patient's family history includes Heart attack in his mother; Heart attack (age of onset: 79) in his father; Pneumonia (age of onset: 52) in his sister.  ROS:  Please see the history of present illness.    All other systems are reviewed and otherwise negative.   PHYSICAL EXAM:  VS:  BP 130/70   Pulse 63   Ht 5' 7.5" (1.715 m)   Wt 204 lb (92.5 kg)   SpO2 96%   BMI 31.48 kg/m  BMI: Body mass index is 31.48 kg/m. Well nourished, well developed, in no acute distress HEENT: normocephalic, atraumatic Neck: no JVD, carotid bruits or masses Cardiac:  RRR; no significant murmurs, no rubs, or gallops Lungs:  CTA b/l, no wheezing, rhonchi or rales Abd: soft, nontender MS: no deformity or atrophy Ext: b/l brawny edema, no pitting edema Skin: warm and dry, no rash Neuro:  No gross deficits appreciated Psych: euthymic mood, full affect   EKG:  Done today and reviewed by myself shows  SR, PACs, no ST/T changes, appears similar to older EKGs   02/11/2020: TTE IMPRESSIONS  1. Left ventricular ejection fraction, by estimation, is 60 to 65%. The  left ventricle has normal function. The left ventricle has no regional  wall motion abnormalities. Left ventricular diastolic parameters are  indeterminate.  2. Right ventricular systolic function is normal. The right ventricular  size is normal. There is normal pulmonary artery systolic pressure.  3. Left atrial size was mildly  dilated.  4. Right atrial size was mildly dilated.  5. The mitral valve is grossly normal. Trivial mitral valve  regurgitation. No evidence of mitral stenosis.  6. The aortic valve is tricuspid. There is moderate calcification of the  aortic valve. There is mild thickening of the aortic valve. Aortic valve  regurgitation is mild.  7. The inferior vena cava is normal in size with greater than 50%  respiratory variability, suggesting right atrial pressure of 3 mmHg.   Comparison(s):  No significant change from prior study.   Recent Labs: 02/10/2020: ALT 16; B Natriuretic Peptide 47.7; TSH 1.278 02/11/2020: Magnesium 2.0 02/14/2020: BUN 14; Creatinine, Ser 1.27; Hemoglobin 13.4; Platelets 216.0; Potassium 4.2; Sodium 141  04/06/2019: Cholesterol 162; HDL 40.10; LDL Cholesterol 88; Total CHOL/HDL Ratio 4; Triglycerides 169.0; VLDL 33.8   CrCl cannot be calculated (Patient's most recent lab result is older than the maximum 21 days allowed.).   Wt Readings from Last 3 Encounters:  03/08/20 204 lb (92.5 kg)  02/14/20 200 lb (90.7 kg)  02/11/20 192 lb 9.6 oz (87.4 kg)     Other studies reviewed: Additional studies/records reviewed today include: summarized above  ASSESSMENT AND PLAN:  1. persistent AFib     CHA2DS2Vasc is 5, on  eliquis, aappropriately dosed     Denies symptoms of AF     On flecainide and Toprol  2.  HTN      Orthostatics today are positive without symptoms      De drinks very little water  3. H/o rate related CM     LVEF remains normalized 4. Chronic edema     At his baseline     Mentions that quite a while ago he can not recall who, but stopped his lasix, his edema remains unchanged without it     No SOB, DOE, lungs are clear  5. Syncope     Felt vagal, + orthostatics      Associated with vomiting episode     Orthostatics remain abnormal  Will reduce his Toprol to 1/2 tab 12.5mg  daily discussed at length importance of adequate hydration  6. Pre-EGD      Pt with low cardiac risk score     OK to hold Eliquis 2, no more then 3 days for his procedure and resume as directed by GI afterwards    Disposition: F/u with [place] in 15mo, sooner if needed.    Current medicines are reviewed at length with the patient today.  The patient did not have any concerns regarding medicines.  Venetia Night, PA-C 03/08/2020 11:00 AM     [address] [place] [address] [phone] (office)  [phone] (fax)

## 2020-03-08 NOTE — Patient Instructions (Signed)
Medication Instructions:   Your physician recommends that you continue on your current medications as directed. Please refer to the Current Medication list given to you today.  *If you need a refill on your cardiac medications before your next appointment, please call your pharmacy*   Lab Work: NONE ORDERED  TODAY   If you have labs (blood work) drawn today and your tests are completely normal, you will receive your results only by:  MyChart Message (if you have MyChart) OR  A paper copy in the mail If you have any lab test that is abnormal or we need to change your treatment, we will call you to review the results.   Testing/Procedures: NONE ORDERED  TODAY    Follow-Up: At [facility], you and your health needs are our priority.  As part of our continuing mission to provide you with exceptional heart care, we have created designated Provider Care Teams.  These Care Teams include your primary Cardiologist (physician) and Advanced Practice Providers (APPs -  Physician Assistants and Nurse Practitioners) who all work together to provide you with the care you need, when you need it.  We recommend signing up for the patient portal called "MyChart".  Sign up information is provided on this After Visit Summary.  MyChart is used to connect with patients for Virtual Visits (Telemedicine).  Patients are able to view lab/test results, encounter notes, upcoming appointments, etc.  Non-urgent messages can be sent to your provider as well.   To learn more about what you can do with MyChart, go to [email or web address].    Your next appointment:   6 month(s)  The format for your next appointment:   In Person  Provider:   You may see Sherryl Manges, MD or one of the following Advanced Practice Providers on your designated Care Team:    Gypsy Balsam, NP  Francis Dowse, [place]  Casimiro Needle "Otilio Saber, [place]    Other Instructions

## 2020-03-13 ENCOUNTER — Other Ambulatory Visit: Payer: Self-pay | Admitting: Internal Medicine

## 2020-03-13 NOTE — Telephone Encounter (Signed)
Prescription refill request for Eliquis received.  Indication: Afib Last office visit: 03/08/2020, Keitha Butte Scr: 1.27, 02/14/2020 Age: 86 yo Weight: 92.5 kg   Prescription refill sent.

## 2020-03-20 ENCOUNTER — Ambulatory Visit: Payer: PPO | Admitting: Gastroenterology

## 2020-03-22 ENCOUNTER — Emergency Department (HOSPITAL_BASED_OUTPATIENT_CLINIC_OR_DEPARTMENT_OTHER)
Admission: EM | Admit: 2020-03-22 | Discharge: 2020-03-22 | Disposition: A | Discharge status: Home / Self Care | Payer: PPO | Attending: Emergency Medicine | Admitting: Emergency Medicine

## 2020-03-22 ENCOUNTER — Encounter (HOSPITAL_BASED_OUTPATIENT_CLINIC_OR_DEPARTMENT_OTHER): Payer: Self-pay | Admitting: *Deleted

## 2020-03-22 ENCOUNTER — Other Ambulatory Visit: Payer: Self-pay

## 2020-03-22 DIAGNOSIS — Z85028 Personal history of other malignant neoplasm of stomach: Secondary | ICD-10-CM | POA: Diagnosis not present

## 2020-03-22 DIAGNOSIS — Z7901 Long term (current) use of anticoagulants: Secondary | ICD-10-CM | POA: Diagnosis not present

## 2020-03-22 DIAGNOSIS — E119 Type 2 diabetes mellitus without complications: Secondary | ICD-10-CM | POA: Insufficient documentation

## 2020-03-22 DIAGNOSIS — I10 Essential (primary) hypertension: Secondary | ICD-10-CM | POA: Diagnosis not present

## 2020-03-22 DIAGNOSIS — Z79899 Other long term (current) drug therapy: Secondary | ICD-10-CM | POA: Diagnosis not present

## 2020-03-22 DIAGNOSIS — Z87891 Personal history of nicotine dependence: Secondary | ICD-10-CM | POA: Insufficient documentation

## 2020-03-22 DIAGNOSIS — R040 Epistaxis: Secondary | ICD-10-CM | POA: Diagnosis not present

## 2020-03-22 MED ORDER — OXYMETAZOLINE HCL 0.05 % NA SOLN
1.0000 | Freq: Once | NASAL | Status: AC
  Administered 2020-03-22: 1 via NASAL
  Filled 2020-03-22: qty 30

## 2020-03-22 MED ORDER — ONDANSETRON 4 MG PO TBDP
ORAL_TABLET | ORAL | Status: AC
  Filled 2020-03-22: qty 1

## 2020-03-22 NOTE — ED Triage Notes (Signed)
Pt c/o nosebleed x 1 day pt is on blood thinners

## 2020-03-22 NOTE — ED Notes (Signed)
Patient verbalizes understanding of discharge instructions. Opportunity for questioning and answers were provided. Armband removed by staff, pt discharged from ED ambulatory to home.  

## 2020-03-22 NOTE — ED Provider Notes (Signed)
[facility] EMERGENCY DEPARTMENT Provider Note   CSN: [id number] Arrival date & time: 03/22/20  1823     History Chief Complaint  Patient presents with  . Epistaxis    Ronald [name] is a 86 y.o. male.  Right nare epistaxis, on Eliquis for A. fib.  Tried symptomatic control at home was intermittently successful but then unable to control.  He has had to have nasal packing in the past.  He has follow-up with ENT for packing removal but never had any other work-up done.  Patient denies chest pain lightheadedness shortness of breath.  She denies trauma recent illness or infection.   Epistaxis Associated symptoms: no congestion, no cough, no fever and no headaches        Past Medical History:  Diagnosis Date  . Arthritis    one finger  . BPH (benign prostatic hypertrophy)    f/u @ Biopsy, s/p bx 2009 apro (-)  . Cataract   . Colon polyp   . Complication of anesthesia    violent upon waking up (only 1 time)   . Diverticulosis   . Dysrhythmia    A-flutter/Afib  . Early cataract    left  . Edema leg    asymetric , L > R , [place] neg for DVT 09-2010  . Gastric polyps   . GERD (gastroesophageal reflux disease)   . Hemorrhoids   . History of kidney stones   . Hypercalcemia    parathyroidectomy 04/2008  . Hyperlipidemia   . Hypertension   . Iron deficiency anemia   . Polyp, stomach   . Pre-diabetes   . Wears dentures   . Wears glasses     Patient Active Problem List   Diagnosis Date Noted  . Syncope 02/10/2020  . 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 A-fib ([place]) 05/22/2019  . Atrial tachycardia ([place]) 05/22/2019  . Genetic testing 03/14/2019  . Gastric polyps   . 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 Lung nodule 11/19/2017  . Atrial flutter ([place]) 08/09/2016  . PCP NOTES >>>>>>>>>>>>> 12/02/2014  . 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 Personal history of colonic polyps 12/01/2011  . Annual physical exam 09/23/2010  .  Elevated PSA 04/12/2008  . ERECTILE DYSFUNCTION 05/25/2007  . DMII (diabetes mellitus, type 2) ([place]) 03/02/2007  . CALCULUS OF KIDNEY 02/15/2007  . HYPERPARATHYROIDISM UNSPECIFIED 11/16/2006  . HYPERLIPIDEMIA 11/11/2006  . Essential hypertension 11/11/2006  . GERD 11/11/2006    Past Surgical History:  Procedure Laterality Date  . A-FLUTTER ABLATION N/A 08/12/2017   Procedure: A-FLUTTER ABLATION;  Surgeon: Deboraha Sprang, MD;  Location: [facility];  Service: Cardiovascular;  Laterality: N/A;  . APPENDECTOMY    . BIOPSY  09/20/2018   Procedure: BIOPSY;  Surgeon: Rush Landmark Telford Nab., MD;  Location: [place] [facility];  Service: Gastroenterology;;  . CATARACT EXTRACTION Right   . COLONOSCOPY  12/16/2011   Procedure: COLONOSCOPY;  Surgeon: Inda Castle, MD;  Location: [facility];  Service: Endoscopy;  Laterality: N/A;  . ENDOSCOPIC MUCOSAL RESECTION N/A 09/20/2018   Procedure: ENDOSCOPIC MUCOSAL RESECTION;  Surgeon: Rush Landmark Telford Nab., MD;  Location: [place];  Service: Gastroenterology;  Laterality: N/A;  . ENDOSCOPIC MUCOSAL RESECTION  01/31/2019   Procedure: ENDOSCOPIC MUCOSAL RESECTION;  Surgeon: Rush Landmark Telford Nab., MD;  Location: [facility] ENDOSCOPY;  Service: Gastroenterology;;  . ESOPHAGOGASTRODUODENOSCOPY N/A 01/17/2013   Procedure: ESOPHAGOGASTRODUODENOSCOPY (EGD);  Surgeon: Inda Castle, MD;  Location: [facility];  Service: Endoscopy;  Laterality:  N/A;  . ESOPHAGOGASTRODUODENOSCOPY (EGD) WITH PROPOFOL N/A 09/20/2018   Procedure: ESOPHAGOGASTRODUODENOSCOPY (EGD) WITH PROPOFOL;  Surgeon: Rush Landmark Telford Nab., MD;  Location: [place];  Service: Gastroenterology;  Laterality: N/A;  . ESOPHAGOGASTRODUODENOSCOPY (EGD) WITH PROPOFOL N/A 01/31/2019   Procedure: ESOPHAGOGASTRODUODENOSCOPY (EGD) WITH PROPOFOL;  Surgeon: Rush Landmark Telford Nab., MD;  Location: [place];  Service: Gastroenterology;  Laterality: N/A;  . EUS N/A 01/31/2019   Procedure: UPPER  ENDOSCOPIC ULTRASOUND (EUS) RADIAL;  Surgeon: Rush Landmark Telford Nab., MD;  Location: [place];  Service: Gastroenterology;  Laterality: N/A;  . EXTERNAL EAR SURGERY  01/2016   BCC removal  . EYE SURGERY  infant   "lazy eye"  . FINE NEEDLE ASPIRATION  01/31/2019   Procedure: FINE NEEDLE ASPIRATION (FNA) LINEAR;  Surgeon: Irving Copas., MD;  Location: [facility] ENDOSCOPY;  Service: Gastroenterology;;  . Otho Darner SIGMOIDOSCOPY  02/09/2012   Procedure: FLEXIBLE SIGMOIDOSCOPY;  Surgeon: Inda Castle, MD;  Location: [facility];  Service: Endoscopy;  Laterality: N/A;  . HEMORRHOID BANDING  02/09/2012   Procedure: HEMORRHOID BANDING;  Surgeon: Inda Castle, MD;  Location: [facility];  Service: Endoscopy;  Laterality: N/A;  . HEMOSTASIS CLIP PLACEMENT  09/20/2018   Procedure: HEMOSTASIS CLIP PLACEMENT;  Surgeon: Irving Copas., MD;  Location: [place];  Service: Gastroenterology;;  . HEMOSTASIS CLIP PLACEMENT  01/31/2019   Procedure: HEMOSTASIS CLIP PLACEMENT;  Surgeon: Irving Copas., MD;  Location: [place];  Service: Gastroenterology;;  . HOT HEMOSTASIS N/A 01/17/2013   Procedure: HOT HEMOSTASIS (ARGON PLASMA COAGULATION/BICAP);  Surgeon: Inda Castle, MD;  Location: [facility];  Service: Endoscopy;  Laterality: N/A;  . KIDNEY STONE SURGERY    . MULTIPLE TOOTH EXTRACTIONS    . PARATHYROIDECTOMY  04/2008  . POLYPECTOMY  09/20/2018   Procedure: POLYPECTOMY;  Surgeon: Mansouraty, Telford Nab., MD;  Location: [place];  Service: Gastroenterology;;  . POLYPECTOMY  01/31/2019   Procedure: POLYPECTOMY;  Surgeon: Irving Copas., MD;  Location: [facility] [place];  Service: Gastroenterology;;  . Clide Deutscher  09/20/2018   Procedure: Clide Deutscher;  Surgeon: Irving Copas., MD;  Location: [place];  Service: Gastroenterology;;  . Clide Deutscher  01/31/2019   Procedure: Clide Deutscher;  Surgeon: Irving Copas., MD;  Location: [facility]  ENDOSCOPY;  Service: Gastroenterology;;  . TONSILLECTOMY         Family History  Problem Relation Age of Onset  . Heart attack Father 9  . Heart attack Mother   . Pneumonia Sister 6  . Colon cancer Neg Hx   . Prostate cancer Neg Hx   . Diabetes Neg Hx     Social History   Tobacco Use  . Smoking status: Former Smoker    Packs/day: 0.50    Years: 25.00    Pack years: 12.50    Types: Cigarettes    Quit date: 11/30/1981    Years since quitting: 38.3  . Smokeless tobacco: Never Used  Vaping Use  . Vaping Use: Never used  Substance Use Topics  . Alcohol use: Yes    Alcohol/week: 7.0 standard drinks    Types: 7 Glasses of wine per week    Comment: occasional beer with dinner  . Drug use: No    Home Medications Prior to Admission medications   Medication Sig Start Date End Date Taking? Authorizing Provider  cholecalciferol (VITAMIN D) 1000 UNITS tablet Take 1,000 Units by mouth every evening.     [provider]  ELIQUIS 5 MG TABS tablet Take 1 tablet by mouth twice daily 03/13/20  Deboraha Sprang, MD  ferrous sulfate 325 (65 FE) MG tablet Take 325 mg by mouth daily with breakfast.    [provider]  flecainide (TAMBOCOR) 100 MG tablet TAKE 1 TABLET BY MOUTH ONCE DAILY IN THE EVENING 11/16/19   Deboraha Sprang, MD  flecainide (TAMBOCOR) 50 MG tablet Take 50 mg by mouth daily before breakfast.    [provider]  metoprolol succinate (TOPROL-XL) 50 MG 24 hr tablet Take 1 tablet by mouth once daily 11/22/19   Deboraha Sprang, MD  Multiple Vitamin (MULTIVITAMIN WITH MINERALS) TABS tablet Take 1 tablet by mouth daily at 12 noon.    [provider]  NIFEdipine (PROCARDIA XL/NIFEDICAL XL) 60 MG 24 hr tablet Take 1 tablet (60 mg total) by mouth daily. 11/22/19   Colon Branch, MD  omeprazole (PRILOSEC) 40 MG capsule TAKE 1 CAPSULE BY MOUTH TWICE DAILY BEFORE MEAL(S) 02/20/20   Colon Branch, MD  Saw Palmetto 450 MG CAPS Take 450 mg by mouth every evening.     [provider]  simvastatin (ZOCOR) 40 MG tablet TAKE 1 TABLET BY MOUTH AT BEDTIME 02/20/20   Colon Branch, MD    Allergies    Cucumber extract and Sulfonamide derivatives  Review of Systems   Review of Systems  Constitutional: Negative for chills and fever.  HENT: Positive for nosebleeds. Negative for congestion and rhinorrhea.   Respiratory: Negative for cough and shortness of breath.   Cardiovascular: Negative for chest pain and palpitations.  Gastrointestinal: Negative for diarrhea, nausea and vomiting.  Genitourinary: Negative for difficulty urinating and dysuria.  Musculoskeletal: Negative for arthralgias and back pain.  Skin: Negative for color change and rash.  Neurological: Negative for light-headedness and headaches.    Physical Exam Updated Vital Signs BP (!) 118/92 (BP Location: Left Arm)   Pulse 74   Temp 98 F (36.7 C) (Oral)   Resp 20   Ht 5\' 8"  (1.727 m)   Wt 89.8 kg   SpO2 95%   BMI 30.11 kg/m   Physical Exam Vitals and nursing note reviewed.  Constitutional:      General: He is not in acute distress.    Appearance: Normal appearance.  HENT:     Head: Normocephalic and atraumatic.     Nose: No rhinorrhea.     Comments: Brisk bleeding from the right nare unable to visualize source Eyes:     General:        Right eye: No discharge.        Left eye: No discharge.     Conjunctiva/sclera: Conjunctivae normal.  Cardiovascular:     Rate and Rhythm: Normal rate and regular rhythm.  Pulmonary:     Effort: Pulmonary effort is normal.     Breath sounds: No stridor.  Abdominal:     General: Abdomen is flat. There is no distension.     Palpations: Abdomen is soft.  Musculoskeletal:        General: No deformity or signs of injury.  Skin:    General: Skin is warm and dry.  Neurological:     General: No focal deficit present.     Mental Status: He is alert. Mental status is at baseline.     Motor: No weakness.  Psychiatric:        Mood and  Affect: Mood normal.        Behavior: Behavior normal.        Thought Content: Thought content normal.  ED Results / Procedures / Treatments   Labs (all labs ordered are listed, but only abnormal results are displayed) Labs Reviewed - No data to display  EKG None  Radiology No results found.  Procedures .Epistaxis Management  Date/Time: 03/22/2020 10:19 PM Performed by: Breck Coons, MD Authorized by: Breck Coons, MD   Consent:    Consent obtained:  Verbal   Consent given by:  Patient   Risks discussed:  Bleeding Universal protocol:    Procedure explained and questions answered to patient or proxy's satisfaction: yes     Relevant documents present and verified: yes     Patient identity confirmed:  Verbally with patient Anesthesia:    Anesthesia method:  None Procedure details:    Treatment site:  R anterior   Repair method: afrin and direct pressure.   Treatment complexity:  Limited   Treatment episode: initial   Post-procedure details:    Assessment:  Bleeding stopped   Procedure completion:  Tolerated well, no immediate complications   (including critical care time)  Medications Ordered in ED Medications  ondansetron (ZOFRAN-ODT) 4 MG disintegrating tablet (  Given 03/22/20 2030)  oxymetazoline (AFRIN) 0.05 % nasal spray 1 spray (1 spray Each Nare Given 03/22/20 2125)    ED Course  I have reviewed the triage vital signs and the nursing notes.  Pertinent labs & imaging results that were available during my care of the patient were reviewed by me and considered in my medical decision making (see chart for details).    MDM Rules/Calculators/A&P                         Right nare nose bleed on eliquis patient blew out a large clot now we are holding pressure.  We will use Afrin to try and help if this does not achieve hemostasis.  He is hemodynamically stable, no signs of of effect from acute blood loss anemia.  The patient has been observed for several  minutes after hemostasis was obtained.  He was comfortable with discharge home, given instructions for hemostasis home.  Advised to come back for any complications.  Told to follow-up with PCP and ENT. Final Clinical Impression(s) / ED Diagnoses Final diagnoses:  Epistaxis    Rx / DC Orders ED Discharge Orders    None       Breck Coons, MD 03/22/20 2219

## 2020-03-22 NOTE — Discharge Instructions (Addendum)
If you start to experience nosebleed again, blow out any blood and clot then spray 2 sprays of Afrin at the affected area, then hold pressure for at least 10 minutes, if this does not work come and see [place] and we will help you.  Follow-up with your doctors for routine checkups and to continue assess the risk and benefit of your Eliquis.  Follow-up with ENT as well they may need to do other procedures or evaluation for these nosebleeds.

## 2020-03-27 ENCOUNTER — Telehealth: Payer: Self-pay

## 2020-03-27 NOTE — Telephone Encounter (Signed)
See note per Dr Charlcie Cradle;   "OK to hold Eliquis 2, no more then 3 days for his procedure and resume as directed by GI afterwards"  I spoke with the pt and he has been advised to hold Eliquis for 2 days prior to the appt.  He verbalized instructions.

## 2020-03-27 NOTE — Telephone Encounter (Signed)
-----   Message from Timothy Lasso, RN sent at 03/26/2020 12:41 PM EST -----  ----- Message ----- From: Timothy Lasso, RN Sent: 03/26/2020  12:00 AM EST To: Timothy Lasso, RN   ----- Message ----- From: Timothy Lasso, RN Sent: 03/14/2020  12:00 AM EST To: Timothy Lasso, RN  Make sure pt has anti coag clearance

## 2020-03-31 ENCOUNTER — Other Ambulatory Visit (HOSPITAL_COMMUNITY)
Admission: RE | Admit: 2020-03-31 | Discharge: 2020-03-31 | Disposition: A | Discharge status: Home / Self Care | Payer: PPO | Source: Ambulatory Visit | Attending: Gastroenterology | Admitting: Gastroenterology

## 2020-03-31 DIAGNOSIS — Z20822 Contact with and (suspected) exposure to covid-19: Secondary | ICD-10-CM | POA: Insufficient documentation

## 2020-03-31 DIAGNOSIS — Z01812 Encounter for preprocedural laboratory examination: Secondary | ICD-10-CM | POA: Diagnosis not present

## 2020-03-31 LAB — SARS CORONAVIRUS 2 (TAT 6-24 HRS): SARS Coronavirus 2: NEGATIVE

## 2020-04-02 NOTE — Telephone Encounter (Signed)
[facility] and [place], I spoke with Mr L this evening about recurrent epistaxis which has continued to be problematic.  He has seen ENT in the past, Dr Wilburn Cornelia who may or may not be still in town with all the disruption at [facility].   We discussed the risks and benefits of anticoagulation with his CHADSVASC score of 3 ( age-89, HTN-1) and resolved cardiomyopathy. Annualized to about a risk of 3.5 % and with the residual risk of stroke and ICH estimate an absolute benefit of about 2.   Given the risk of epistaxis we will resume ( post EGD) his Apixoban  at 2.5 mg bid, he is aware that it may have little benefit on bleeding but is likely assoc with reduced benefit against stroke, although his Cr is about 1.3-1.4  He will consider whether, based on  his response to the lower dose whether he would like to consider cautery of his nose ( told would be painful) or stopping the anticoagulation  He will let us know  I also explained to him that I was solely responsible for the late return call and asked that he bring catsup to the office when he comes back to help me eat my crow

## 2020-04-03 ENCOUNTER — Encounter: Payer: Self-pay | Admitting: Internal Medicine

## 2020-04-03 NOTE — Anesthesia Preprocedure Evaluation (Addendum)
Anesthesia Evaluation  Patient identified by MRN, date of birth, ID band Patient awake    Reviewed: Allergy & Precautions, H&P , NPO status , Patient's Chart, lab work & pertinent test results  Airway Mallampati: II  TM Distance: >3 FB Neck ROM: Full    Dental no notable dental hx. (+) Upper Dentures, Partial Lower, Dental Advisory Given   Pulmonary neg pulmonary ROS, former smoker,    Pulmonary exam normal breath sounds clear to auscultation       Cardiovascular Exercise Tolerance: Good hypertension, Pt. on medications and Pt. on home beta blockers + dysrhythmias Atrial Fibrillation  Rhythm:Regular Rate:Normal     Neuro/Psych negative neurological ROS  negative psych ROS   GI/Hepatic Neg liver ROS, GERD  Medicated,  Endo/Other  negative endocrine ROS  Renal/GU negative Renal ROS  negative genitourinary   Musculoskeletal  (+) Arthritis ,   Abdominal   Peds  Hematology negative hematology ROS (+)   Anesthesia Other Findings   Reproductive/Obstetrics negative OB ROS                            Anesthesia Physical Anesthesia Plan  ASA: III  Anesthesia Plan: MAC   Post-op Pain Management:    Induction: Intravenous  PONV Risk Score and Plan: 1 and Propofol infusion  Airway Management Planned: Simple Face Mask  Additional Equipment:   Intra-op Plan:   Post-operative Plan:   Informed Consent: I have reviewed the patients History and Physical, chart, labs and discussed the procedure including the risks, benefits and alternatives for the proposed anesthesia with the patient or authorized representative who has indicated his/her understanding and acceptance.     Dental advisory given  Plan Discussed with: CRNA  Anesthesia Plan Comments:        Anesthesia Quick Evaluation

## 2020-04-04 ENCOUNTER — Encounter (HOSPITAL_COMMUNITY)
Admission: RE | Disposition: A | Discharge status: Home / Self Care | Payer: Self-pay | Source: Home / Self Care | Attending: Gastroenterology

## 2020-04-04 ENCOUNTER — Encounter (HOSPITAL_COMMUNITY): Payer: Self-pay | Admitting: Gastroenterology

## 2020-04-04 ENCOUNTER — Ambulatory Visit (HOSPITAL_COMMUNITY): Payer: PPO | Admitting: Anesthesiology

## 2020-04-04 ENCOUNTER — Other Ambulatory Visit: Payer: Self-pay

## 2020-04-04 ENCOUNTER — Ambulatory Visit (HOSPITAL_COMMUNITY)
Admission: RE | Admit: 2020-04-04 | Discharge: 2020-04-04 | Disposition: A | Discharge status: Home / Self Care | Payer: PPO | Attending: Gastroenterology | Admitting: Gastroenterology

## 2020-04-04 DIAGNOSIS — Z87891 Personal history of nicotine dependence: Secondary | ICD-10-CM | POA: Insufficient documentation

## 2020-04-04 DIAGNOSIS — K2970 Gastritis, unspecified, without bleeding: Secondary | ICD-10-CM | POA: Diagnosis not present

## 2020-04-04 DIAGNOSIS — C49A2 Gastrointestinal stromal tumor of stomach: Secondary | ICD-10-CM | POA: Insufficient documentation

## 2020-04-04 DIAGNOSIS — K2289 Other specified disease of esophagus: Secondary | ICD-10-CM | POA: Diagnosis not present

## 2020-04-04 DIAGNOSIS — Z8509 Personal history of malignant neoplasm of other digestive organs: Secondary | ICD-10-CM

## 2020-04-04 DIAGNOSIS — K862 Cyst of pancreas: Secondary | ICD-10-CM

## 2020-04-04 DIAGNOSIS — Z882 Allergy status to sulfonamides status: Secondary | ICD-10-CM | POA: Diagnosis not present

## 2020-04-04 DIAGNOSIS — Z8500 Personal history of malignant neoplasm of unspecified digestive organ: Secondary | ICD-10-CM

## 2020-04-04 DIAGNOSIS — K8020 Calculus of gallbladder without cholecystitis without obstruction: Secondary | ICD-10-CM | POA: Diagnosis not present

## 2020-04-04 DIAGNOSIS — D490 Neoplasm of unspecified behavior of digestive system: Secondary | ICD-10-CM

## 2020-04-04 DIAGNOSIS — K259 Gastric ulcer, unspecified as acute or chronic, without hemorrhage or perforation: Secondary | ICD-10-CM | POA: Diagnosis not present

## 2020-04-04 DIAGNOSIS — K2960 Other gastritis without bleeding: Secondary | ICD-10-CM | POA: Diagnosis not present

## 2020-04-04 DIAGNOSIS — K317 Polyp of stomach and duodenum: Secondary | ICD-10-CM

## 2020-04-04 DIAGNOSIS — K3189 Other diseases of stomach and duodenum: Secondary | ICD-10-CM | POA: Diagnosis not present

## 2020-04-04 HISTORY — PX: HEMOSTASIS CONTROL: SHX6838

## 2020-04-04 HISTORY — PX: HEMOSTASIS CLIP PLACEMENT: SHX6857

## 2020-04-04 HISTORY — PX: SUBMUCOSAL INJECTION: SHX5543

## 2020-04-04 HISTORY — PX: POLYPECTOMY: SHX5525

## 2020-04-04 HISTORY — PX: EUS: SHX5427

## 2020-04-04 HISTORY — PX: ESOPHAGOGASTRODUODENOSCOPY (EGD) WITH PROPOFOL: SHX5813

## 2020-04-04 SURGERY — ESOPHAGOGASTRODUODENOSCOPY (EGD) WITH PROPOFOL
Anesthesia: Monitor Anesthesia Care

## 2020-04-04 MED ORDER — APIXABAN 5 MG PO TABS: 5.0000 mg | ORAL_TABLET | Freq: Two times a day (BID) | ORAL | 1 refills | Status: DC

## 2020-04-04 MED ORDER — PROPOFOL 500 MG/50ML IV EMUL
INTRAVENOUS | Status: DC | PRN
  Administered 2020-04-04 (×2): 20 mg via INTRAVENOUS
  Administered 2020-04-04: 40 mg via INTRAVENOUS
  Administered 2020-04-04: 20 mg via INTRAVENOUS

## 2020-04-04 MED ORDER — SODIUM CHLORIDE 0.9 % IV SOLN: INTRAVENOUS | Status: DC

## 2020-04-04 MED ORDER — EPHEDRINE SULFATE-NACL 50-0.9 MG/10ML-% IV SOSY
PREFILLED_SYRINGE | INTRAVENOUS | Status: DC | PRN
  Administered 2020-04-04 (×4): 10 mg via INTRAVENOUS

## 2020-04-04 MED ORDER — SODIUM CHLORIDE (PF) 0.9 % IJ SOLN
PREFILLED_SYRINGE | INTRAMUSCULAR | Status: DC | PRN
  Administered 2020-04-04: 5 mL

## 2020-04-04 MED ORDER — PROPOFOL 500 MG/50ML IV EMUL
INTRAVENOUS | Status: DC | PRN
  Administered 2020-04-04 (×2): 125 ug/kg/min via INTRAVENOUS

## 2020-04-04 MED ORDER — EPINEPHRINE 1 MG/10ML IJ SOSY
PREFILLED_SYRINGE | INTRAMUSCULAR | Status: AC
  Filled 2020-04-04: qty 10

## 2020-04-04 MED ORDER — LACTATED RINGERS IV SOLN: INTRAVENOUS | Status: DC

## 2020-04-04 SURGICAL SUPPLY — 14 items

## 2020-04-04 NOTE — Transfer of Care (Signed)
Immediate Anesthesia Transfer of Care Note  Patient: Ronald [name]  Procedure(s) Performed: ESOPHAGOGASTRODUODENOSCOPY (EGD) WITH PROPOFOL (N/A ) UPPER ENDOSCOPIC ULTRASOUND (EUS) RADIAL (N/A ) POLYPECTOMY HEMOSTASIS CLIP PLACEMENT HEMOSTASIS CONTROL HOT HEMOSTASIS (ARGON PLASMA COAGULATION/BICAP) (N/A )  Patient Location: PACU and Endoscopy Unit  Anesthesia Type:MAC  Level of Consciousness: awake, alert  and oriented  Airway & Oxygen Therapy: Patient Spontanous Breathing and Patient connected to face mask  Post-op Assessment: Report given to RN and Post -op Vital signs reviewed and stable  Post vital signs: Reviewed and stable  Last Vitals:  Vitals Value Taken Time  BP    Temp    Pulse    Resp    SpO2      Last Pain:  Vitals:   04/04/20 0731  TempSrc: Oral  PainSc: 0-No pain         Complications: No complications documented.

## 2020-04-04 NOTE — Op Note (Signed)
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 Estimated Blood Loss:     Estimated blood loss was minimal. Procedure:                Pre-Anesthesia Assessment:                           - Prior to the procedure, a History and Physical                            was performed, and patient medications and                            allergies were reviewed. The patient's tolerance of                            previous anesthesia was also reviewed. The risks                            and benefits of the procedure and the sedation                            options  and risks were discussed with the patient.                            All questions were answered, and informed consent                            was obtained. Prior Anticoagulants: The patient has                            taken Eliquis (apixaban), last dose was 3 days                            prior to procedure. ASA Grade Assessment: III - A                             patient with severe systemic disease. After                            reviewing the risks and benefits, the patient was                            deemed in satisfactory condition to undergo the                            procedure.                           After obtaining informed consent, the endoscope was                            passed under direct vision. Throughout the                            procedure, the patient's blood pressure, pulse, and                            oxygen saturations were monitored continuously. The                            [id number] ([id number]) Olympus therapeutic endoscope                            was introduced through the mouth, and advanced to                            the second part of duodenum. The [id number]                            ([id number]) Olympus Linear EUS was introduced through                            the mouth, and advanced to the duodenum for  ultrasound examination from the stomach and                            duodenum. The upper EUS was accomplished without                            difficulty. The patient tolerated the procedure. Scope In: Scope Out: Findings:      ENDOSCOPIC FINDING: :      No gross lesions were noted in the entire esophagus.      The Z-line was irregular and was found 38 cm from the incisors.      >30, 5 to 30 mm pedunculated and sessile polyps with a few showing       active bleeding and otherws with stigmata of recent bleeding were found       in the cardia, in the gastric fundus, in the gastric body, at the       incisura and in the gastric antrum. Decision made to proceed with       removal of 3 of the lesions today. Preparations were made for mucosal       resection. NBI imaging and White-light endoscopy was done to demarcate       the borders of the lesion. A 1:100000 solution of epinephrine was       injected to raise the lesion. Snare mucosal resection was  performed.       Resection and retrieval were complete. To prevent bleeding after mucosal       resection, three hemostatic clips were successfully placed (MR       conditional). There was no bleeding at the end of the procedure.       Preparations were made for mucosal resection. NBI imaging and       White-light endoscopy was done to mark the borders of the lesion. A       1:10,000 solution of epinephrine was injected to raise the lesion. Snare       mucosal resection was performed. Resection and retrieval were complete.       To prevent bleeding after mucosal resection, three hemostatic clips were       successfully placed (MR conditional). There was no bleeding at the end       of the procedure. The polyp was removed with a hot snare. Resection and       retrieval were complete. To prevent bleeding after the polypectomy, one       hemostatic clip was successfully placed (MR conditional). There was no       bleeding at the end of the procedure.      Diffuse prominent gastric folds were found in the entire examined       stomach.      Patchy mildly erythematous mucosa was found in the entire examined       stomach.      A medium-sized, submucosal mass with no bleeding was found on the       posterior wall of the gastric antrum - consistent with GIST previously.      No gross lesions were noted in the duodenal bulb, in the first portion       of the duodenum and in the second portion of the duodenum. Biopsies for       histology were taken with a cold forceps for evaluation of celiac  disease.      ENDOSONOGRAPHIC FINDING: :      A round intramural (subepithelial) lesion was found in the antrum of the       stomach. The lesion was hypoechoic. Sonographically, the lesion appeared       to originate from the muscularis propria (Layer 4). The lesion measured       20 mm (in maximum thickness). The lesion also measured 9 mm in diameter.       The outer endosonographic borders were  smooth.      An anechoic lesion suggestive of a cyst was identified in the pancreatic       body. The lesion measured 4.0 mm by 4.4 mm in maximal cross-sectional       diameter. There was a single compartment. There was no associated mass.       There was no internal debris within the fluid-filled cavity.      There was no sign of significant endosonographic abnormality in the       pancreatic head (PD = 1.4 mm), genu of the pancreas (PD = 1.1 mm),       pancreatic body (PD = 0.7 mm) and pancreatic tail (PD = 0.8 mm).      There was no sign of significant endosonographic abnormality in the       common bile duct (2.3 mm) and in the common hepatic duct (3.2 mm).      Many stones were visualized endosonographically in the gallbladder. The       stones were round. They were hyperechoic and characterized by shadowing.      Endosonographic imaging of the ampulla showed no extrinsic compression,       intramural (subepithelial) lesion, mass, varices or wall thickening.      Endosonographic imaging in the visualized portion of the liver showed no       mass.      No malignant-appearing lymph nodes were visualized in the celiac region       (level 20), peripancreatic region and porta hepatis region.      The celiac region was visualized. Impression:               EGD Impression:                           - No gross lesions in esophagus. Z-line irregular,                            38 cm from the incisors.                           - >30 gastric polyps. A few were bleeding or showed                            recent stigmata. Removed via EMR 2 lesions and with                            hot polypectomy 1 lesion. Retrieved. Clips (MR                            conditional) were placed.                           -  Enlarged gastric folds.                           - Erythematous mucosa in the stomach.                           - Gastric tumor on the posterior wall of the                             gastric antrum.                           - No gross lesions in the duodenal bulb, in the                            first portion of the duodenum and in the second                            portion of the duodenum. Biopsied.                           EUS Impression:                           - An intramural (subepithelial) lesion was found in                            the antrum of the stomach. The lesion appeared to                            originate from within the muscularis propria (Layer                            4). A tissue diagnosis was obtained prior to this                            exam. 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                           - A cystic lesion was seen in the pancreatic body.                            Tissue has not been obtained. However, the                            endosonographic appearance is suggestive of a                            benign (true) cyst.                           - There  was no sign of significant pathology in the                            pancreatic head, genu of the pancreas, pancreatic                            body and pancreatic tail.                           - There was no sign of significant pathology in the                            common bile duct and in the common hepatic duct.                           - Many stones were visualized endosonographically                            in the gallbladder.                           - No malignant-appearing lymph nodes were                            visualized in the celiac region (level 20),                            peripancreatic region and porta hepatis region. Moderate Sedation:      Not Applicable - Patient had care per Anesthesia. Recommendation:           - The patient will be observed post-procedure,                            until all discharge criteria are met.                           -  Discharge patient to home.                           - Patient has a contact number available for                            emergencies. The signs and symptoms of potential                            delayed complications were discussed with the                            patient. Return to normal activities tomorrow.                            Written discharge instructions were provided to the                            patient.                           -  Full liquid diet for 24 hours. Then can advance                            tomorrow afternoon/evening into a soft diet x                            72-hours then may increase back to normal diet.                           - Continue PPI twice daily for now.                           - Continue Oral Iron.                           - Recheck Hgb/Hct & Iron indicies in 4-6 weeks to                            ensure stability.                           - May restart Eliquis in 72 hours to decrease risk                            of post-intervention bleeding as much as possible.                           - Follow up with Oncology and decide about overall                            follow up via imaging vs repeat EUS.                           - CTAP will be recommended vs MRI/MRCP due to the                            pancreatic cyst.                           - The findings and recommendations were discussed                            with the patient.                           - The findings and recommendations were discussed                            with the patient's family. Procedure Code(s):        --- Professional ---                           [phone], Esophagogastroduodenoscopy, flexible,  transoral; with endoscopic mucosal resection                           43237, Esophagogastroduodenoscopy, flexible,                            transoral; with endoscopic ultrasound examination                             limited to the esophagus, stomach or duodenum, and                            adjacent structures Diagnosis Code(s):        --- Professional ---                           K22.8, Other specified diseases of esophagus                           K31.7, Polyp of stomach and duodenum                           K29.60, Other gastritis without bleeding                           K31.89, Other diseases of stomach and duodenum                           D49.0, Neoplasm of unspecified behavior of                            digestive system                           K86.2, Cyst of pancreas                           K80.20, Calculus of gallbladder without                            cholecystitis without obstruction                           I89.9, Noninfective disorder of lymphatic vessels                            and lymph nodes, unspecified                           K92.1, Melena (includes Hematochezia)                           Z85.00, Personal history of malignant neoplasm of                            unspecified digestive organ CPT copyright 2019 [employer]. All rights reserved. The codes documented in this report are preliminary and upon coder review  may  be revised to meet current compliance requirements. Justice Britain, MD 04/04/2020 10:01:29 AM Number of Addenda: 0

## 2020-04-04 NOTE — H&P (Signed)
GASTROENTEROLOGY PROCEDURE H&P NOTE   Primary Care Physician: Colon Branch, MD  HPI: Ronald [name] is a 86 y.o. male who presents for EGD/EUS for follow up of gastric hyperplastic polyps but also known gastric GIST being monitored.  Past Medical History:  Diagnosis Date  . Arthritis    one finger  . BPH (benign prostatic hypertrophy)    f/u @ Biopsy, s/p bx 2009 apro (-)  . Cataract   . Colon polyp   . Complication of anesthesia    violent upon waking up (only 1 time)   . Diverticulosis   . Dysrhythmia    A-flutter/Afib  . Early cataract    left  . Edema leg    asymetric , L > R , [place] neg for DVT 09-2010  . Gastric polyps   . GERD (gastroesophageal reflux disease)   . Hemorrhoids   . History of kidney stones   . Hypercalcemia    parathyroidectomy 04/2008  . Hyperlipidemia   . Hypertension   . Iron deficiency anemia   . Polyp, stomach   . Pre-diabetes   . Wears dentures   . Wears glasses    Past Surgical History:  Procedure Laterality Date  . A-FLUTTER ABLATION N/A 08/12/2017   Procedure: A-FLUTTER ABLATION;  Surgeon: Deboraha Sprang, MD;  Location: [facility];  Service: Cardiovascular;  Laterality: N/A;  . APPENDECTOMY    . BIOPSY  09/20/2018   Procedure: BIOPSY;  Surgeon: Rush Landmark Telford Nab., MD;  Location: [place];  Service: Gastroenterology;;  . CATARACT EXTRACTION Right   . COLONOSCOPY  12/16/2011   Procedure: COLONOSCOPY;  Surgeon: Inda Castle, MD;  Location: [facility];  Service: Endoscopy;  Laterality: N/A;  . ENDOSCOPIC MUCOSAL RESECTION N/A 09/20/2018   Procedure: ENDOSCOPIC MUCOSAL RESECTION;  Surgeon: Rush Landmark Telford Nab., MD;  Location: [place];  Service: Gastroenterology;  Laterality: N/A;  . ENDOSCOPIC MUCOSAL RESECTION  01/31/2019   Procedure: ENDOSCOPIC MUCOSAL RESECTION;  Surgeon: Rush Landmark Telford Nab., MD;  Location: [facility];  Service: Gastroenterology;;  . ESOPHAGOGASTRODUODENOSCOPY N/A 01/17/2013   Procedure:  ESOPHAGOGASTRODUODENOSCOPY (EGD);  Surgeon: Inda Castle, MD;  Location: [facility];  Service: Endoscopy;  Laterality: N/A;  . ESOPHAGOGASTRODUODENOSCOPY (EGD) WITH PROPOFOL N/A 09/20/2018   Procedure: ESOPHAGOGASTRODUODENOSCOPY (EGD) WITH PROPOFOL;  Surgeon: Rush Landmark Telford Nab., MD;  Location: [place];  Service: Gastroenterology;  Laterality: N/A;  . ESOPHAGOGASTRODUODENOSCOPY (EGD) WITH PROPOFOL N/A 01/31/2019   Procedure: ESOPHAGOGASTRODUODENOSCOPY (EGD) WITH PROPOFOL;  Surgeon: Rush Landmark Telford Nab., MD;  Location: [place];  Service: Gastroenterology;  Laterality: N/A;  . EUS N/A 01/31/2019   Procedure: UPPER ENDOSCOPIC ULTRASOUND (EUS) RADIAL;  Surgeon: Rush Landmark Telford Nab., MD;  Location: [place];  Service: Gastroenterology;  Laterality: N/A;  . EXTERNAL EAR SURGERY  01/2016   BCC removal  . EYE SURGERY  infant   "lazy eye"  . FINE NEEDLE ASPIRATION  01/31/2019   Procedure: FINE NEEDLE ASPIRATION (FNA) LINEAR;  Surgeon: Irving Copas., MD;  Location: [facility];  Service: Gastroenterology;;  . Otho Darner SIGMOIDOSCOPY  02/09/2012   Procedure: FLEXIBLE SIGMOIDOSCOPY;  Surgeon: Inda Castle, MD;  Location: [facility];  Service: Endoscopy;  Laterality: N/A;  . HEMORRHOID BANDING  02/09/2012   Procedure: HEMORRHOID BANDING;  Surgeon: Inda Castle, MD;  Location: [facility];  Service: Endoscopy;  Laterality: N/A;  . HEMOSTASIS CLIP PLACEMENT  09/20/2018   Procedure: HEMOSTASIS CLIP PLACEMENT;  Surgeon: Irving Copas., MD;  Location: [place];  Service: Gastroenterology;;  .  HEMOSTASIS CLIP PLACEMENT  01/31/2019   Procedure: HEMOSTASIS CLIP PLACEMENT;  Surgeon: Irving Copas., MD;  Location: [place];  Service: Gastroenterology;;  . HOT HEMOSTASIS N/A 01/17/2013   Procedure: HOT HEMOSTASIS (ARGON PLASMA COAGULATION/BICAP);  Surgeon: Inda Castle, MD;  Location: [facility];  Service: Endoscopy;  Laterality: N/A;  .  KIDNEY STONE SURGERY    . MULTIPLE TOOTH EXTRACTIONS    . PARATHYROIDECTOMY  04/2008  . POLYPECTOMY  09/20/2018   Procedure: POLYPECTOMY;  Surgeon: Mansouraty, Telford Nab., MD;  Location: [place];  Service: Gastroenterology;;  . POLYPECTOMY  01/31/2019   Procedure: POLYPECTOMY;  Surgeon: Irving Copas., MD;  Location: [place];  Service: Gastroenterology;;  . Clide Deutscher  09/20/2018   Procedure: Clide Deutscher;  Surgeon: Irving Copas., MD;  Location: [place];  Service: Gastroenterology;;  . Clide Deutscher  01/31/2019   Procedure: Clide Deutscher;  Surgeon: Irving Copas., MD;  Location: [place];  Service: Gastroenterology;;  . TONSILLECTOMY     Current Facility-Administered Medications  Medication Dose Route Frequency Provider Last Rate Last Admin  . 0.9 %  sodium chloride infusion   Intravenous Continuous Mansouraty, Telford Nab., MD      . lactated ringers infusion   Intravenous Continuous Mansouraty, Telford Nab., MD       Allergies  Allergen Reactions  . Cucumber Extract Nausea And Vomiting  . Sulfonamide Derivatives Other (See Comments)    Unknown reaction   Family History  Problem Relation Age of Onset  . Heart attack Father 41  . Heart attack Mother   . Pneumonia Sister 6  . Colon cancer Neg Hx   . Prostate cancer Neg Hx   . Diabetes Neg Hx    Social History   Socioeconomic History  . Marital status: Widowed    Spouse name: Not on file  . Number of children: 3  . Years of education: Not on file  . Highest education level: Not on file  Occupational History  . Occupation: retired   Tobacco Use  . Smoking status: Former Smoker    Packs/day: 0.50    Years: 25.00    Pack years: 12.50    Types: Cigarettes    Quit date: 11/30/1981    Years since quitting: 38.3  . Smokeless tobacco: Never Used  Vaping Use  . Vaping Use: Never used  Substance and Sexual Activity  . Alcohol use: Yes    Alcohol/week: 7.0 standard drinks     Types: 7 Glasses of wine per week    Comment: occasional beer with dinner  . Drug use: No  . Sexual activity: Not on file  Other Topics Concern  . Not on file  Social History Narrative   Retired, single, has  a girlfriend , 3 children  (one lives in town)   Daughter has a copy of his living will   Social Determinants of Radio broadcast assistant Strain: [place]   . Difficulty of Paying Living Expenses: Not hard at all  Food Insecurity: No Food Insecurity  . Worried About Charity fundraiser in the Last Year: Never true  . Ran Out of Food in the Last Year: Never true  Transportation Needs: No Transportation Needs  . Lack of Transportation (Medical): No  . Lack of Transportation (Non-Medical): No  Physical Activity: Not on file  Stress: Not on file  Social Connections: Not on file  Intimate Partner Violence: Not on file    Physical Exam: Vital signs in last 24 hours: Temp:  [  97.9 F (36.6 C)] 97.9 F (36.6 C) (01/26 0731) Pulse Rate:  [63] 63 (01/26 0731) Resp:  [22] 22 (01/26 0731) BP: (139)/(81) 139/81 (01/26 0731) SpO2:  [98 %] 98 % (01/26 0731) Weight:  [89.4 kg] 89.4 kg (01/26 0731)   GEN: NAD EYE: Sclerae anicteric ENT: MMM CV: Non-tachycardic GI: Soft, NT/ND NEURO:  Alert & Oriented x 3  Lab Results: No results for input(s): WBC, HGB, HCT, PLT in the last 72 hours. BMET No results for input(s): NA, K, CL, CO2, GLUCOSE, BUN, CREATININE, CALCIUM in the last 72 hours. LFT No results for input(s): PROT, ALBUMIN, AST, ALT, ALKPHOS, BILITOT, BILIDIR, IBILI in the last 72 hours. PT/INR No results for input(s): LABPROT, INR in the last 72 hours.   Impression / Plan: This is a 86 y.o.male who presents for EGD/EUS for follow up of gastric hyperplastic polyps but also known gastric GIST being monitored.  The risks of an EUS including intestinal perforation, bleeding, infection, aspiration, and medication effects were discussed as was the possibility it may not  give a definitive diagnosis if a biopsy is performed.  When a biopsy of the pancreas is done as part of the EUS, there is an additional risk of pancreatitis at the rate of about 1-2%.  It was explained that procedure related pancreatitis is typically mild, although it can be severe and even life threatening, which is why we do not perform random pancreatic biopsies and only biopsy a lesion/area we feel is concerning enough to warrant the risk.  The risks and benefits of endoscopic evaluation were discussed with the patient; these include but are not limited to the risk of perforation, infection, bleeding, missed lesions, lack of diagnosis, severe illness requiring hospitalization, as well as anesthesia and sedation related illnesses.  The patient is agreeable to proceed.    Justice Britain, MD [facility] Office # [phone]

## 2020-04-04 NOTE — Anesthesia Postprocedure Evaluation (Signed)
Anesthesia Post Note  Patient: Ronald [name]  Procedure(s) Performed: ESOPHAGOGASTRODUODENOSCOPY (EGD) WITH PROPOFOL (N/A ) UPPER ENDOSCOPIC ULTRASOUND (EUS) RADIAL (N/A ) POLYPECTOMY HEMOSTASIS CLIP PLACEMENT HEMOSTASIS CONTROL HOT HEMOSTASIS (ARGON PLASMA COAGULATION/BICAP) (N/A )     Patient location during evaluation: PACU Anesthesia Type: MAC Level of consciousness: awake and alert Pain management: pain level controlled Vital Signs Assessment: post-procedure vital signs reviewed and stable Respiratory status: spontaneous breathing, nonlabored ventilation and respiratory function stable Cardiovascular status: stable and blood pressure returned to baseline Postop Assessment: no apparent nausea or vomiting Anesthetic complications: no   No complications documented.  Last Vitals:  Vitals:   04/04/20 0945 04/04/20 0950  BP: (!) 126/55 (!) 132/59  Pulse: 65 63  Resp: (!) 22 16  Temp:    SpO2: 93% 92%    Last Pain:  Vitals:   04/04/20 0950  TempSrc:   PainSc: 0-No pain                 Kenzley Ke,W. EDMOND

## 2020-04-04 NOTE — Discharge Instructions (Signed)
YOU HAD AN ENDOSCOPIC PROCEDURE TODAY: Refer to the procedure report and other information in the discharge instructions given to you for any specific questions about what was found during the examination. If this information does not answer your questions, please call [place] office at [phone] to clarify.   YOU SHOULD EXPECT: Some feelings of bloating in the abdomen. Passage of more gas than usual. Walking can help get rid of the air that was put into your GI tract during the procedure and reduce the bloating. If you had a lower endoscopy (such as a colonoscopy or flexible sigmoidoscopy) you may notice spotting of blood in your stool or on the toilet paper. Some abdominal soreness may be present for a day or two, also.  DIET: Your first meal following the procedure should be a light meal and then it is ok to progress to your normal diet. A half-sandwich or bowl of soup is an example of a good first meal. Heavy or fried foods are harder to digest and may make you feel nauseous or bloated. Drink plenty of fluids but you should avoid alcoholic beverages for 24 hours. If you had a esophageal dilation, please see attached instructions for diet.    ACTIVITY: Your care partner should take you home directly after the procedure. You should plan to take it easy, moving slowly for the rest of the day. You can resume normal activity the day after the procedure however YOU SHOULD NOT DRIVE, use power tools, machinery or perform tasks that involve climbing or major physical exertion for 24 hours (because of the sedation medicines used during the test).   SYMPTOMS TO REPORT IMMEDIATELY: A gastroenterologist can be reached at any hour. Please call [phone]  for any of the following symptoms:   Following upper endoscopy (EGD, EUS, ERCP, esophageal dilation) Vomiting of blood or coffee ground material  New, significant abdominal pain  New, significant chest pain or pain under the shoulder blades  Painful or  persistently difficult swallowing  New shortness of breath  Black, tarry-looking or red, bloody stools  FOLLOW UP:  If any biopsies were taken you will be contacted by phone or by letter within the next 1-3 weeks. Call [phone]  if you have not heard about the biopsies in 3 weeks.  Please also call with any specific questions about appointments or follow up tests.  

## 2020-04-05 LAB — SURGICAL PATHOLOGY

## 2020-04-09 ENCOUNTER — Ambulatory Visit (INDEPENDENT_AMBULATORY_CARE_PROVIDER_SITE_OTHER): Payer: PPO | Admitting: Internal Medicine

## 2020-04-09 ENCOUNTER — Encounter: Payer: Self-pay | Admitting: Gastroenterology

## 2020-04-09 ENCOUNTER — Encounter: Payer: Self-pay | Admitting: Internal Medicine

## 2020-04-09 ENCOUNTER — Other Ambulatory Visit: Payer: Self-pay

## 2020-04-09 VITALS — BP 128/80 | HR 59 | Temp 97.9°F | Resp 18 | Ht 68.0 in | Wt 206.5 lb

## 2020-04-09 DIAGNOSIS — Z8509 Personal history of malignant neoplasm of other digestive organs: Secondary | ICD-10-CM

## 2020-04-09 DIAGNOSIS — E785 Hyperlipidemia, unspecified: Secondary | ICD-10-CM

## 2020-04-09 DIAGNOSIS — Z0000 Encounter for general adult medical examination without abnormal findings: Secondary | ICD-10-CM | POA: Diagnosis not present

## 2020-04-09 DIAGNOSIS — Z862 Personal history of diseases of the blood and blood-forming organs and certain disorders involving the immune mechanism: Secondary | ICD-10-CM | POA: Diagnosis not present

## 2020-04-09 LAB — CBC WITH DIFFERENTIAL/PLATELET
Basophils Absolute: 0 10*3/uL (ref 0.0–0.1)
Basophils Relative: 0.6 % (ref 0.0–3.0)
Eosinophils Absolute: 0.1 10*3/uL (ref 0.0–0.7)
Eosinophils Relative: 1.9 % (ref 0.0–5.0)
HCT: 44.1 % (ref 39.0–52.0)
Hemoglobin: 14.9 g/dL (ref 13.0–17.0)
Lymphocytes Relative: 20.4 % (ref 12.0–46.0)
Lymphs Abs: 1.5 10*3/uL (ref 0.7–4.0)
MCHC: 33.7 g/dL (ref 30.0–36.0)
MCV: 93.1 fl (ref 78.0–100.0)
Monocytes Absolute: 0.6 10*3/uL (ref 0.1–1.0)
Monocytes Relative: 7.8 % (ref 3.0–12.0)
Neutro Abs: 5.1 10*3/uL (ref 1.4–7.7)
Neutrophils Relative %: 69.3 % (ref 43.0–77.0)
Platelets: 191 10*3/uL (ref 150.0–400.0)
RBC: 4.73 Mil/uL (ref 4.22–5.81)
RDW: 13.4 % (ref 11.5–15.5)
WBC: 7.3 10*3/uL (ref 4.0–10.5)

## 2020-04-09 LAB — LIPID PANEL
Cholesterol: 157 mg/dL (ref 0–200)
HDL: 41.7 mg/dL (ref >39.00)
LDL Cholesterol: 84 mg/dL (ref 0–99)
NonHDL: 114.84
Total CHOL/HDL Ratio: 4
Triglycerides: 154 mg/dL — ABNORMAL HIGH (ref 0.0–149.0)
VLDL: 30.8 mg/dL (ref 0.0–40.0)

## 2020-04-09 NOTE — Assessment & Plan Note (Signed)
Here for CPX DM, HTN, hyperlipidemia: On simvastatin, metoprolol, Procardia.  All seem well controlled, check FLP. Atrial tachycardia/flutter: At this point is asymptomatic, currently anticoagulated.  Check a CBC Nosebleeds: See previous entries, had a lot of problems, at this point things are improving, had few minor nosebleeds that he can self control. GI: Recently had a EGD and EUS, see report.  Closely f/u  by GI RTC 6 months

## 2020-04-09 NOTE — Progress Notes (Signed)
Subjective:    Patient ID: Ronald [name], male    DOB: 1934/06/07, 86 y.o.   MRN: [id number]  DOS:  04/09/2020 Type of visit - description: CPX Since the last office visit, he is doing okay. Had a lot of problems with nosebleeds but that seems to be better   Review of Systems  Other than above, a 14 point review of systems is negative      Past Medical History:  Diagnosis Date  . Arthritis    one finger  . BPH (benign prostatic hypertrophy)    f/u @ Biopsy, s/p bx 2009 apro (-)  . Cataract   . Colon polyp   . Complication of anesthesia    violent upon waking up (only 1 time)   . Diverticulosis   . Dysrhythmia    A-flutter/Afib  . Early cataract    left  . Edema leg    asymetric , L > R , [place] neg for DVT 09-2010  . Gastric polyps   . GERD (gastroesophageal reflux disease)   . Hemorrhoids   . History of kidney stones   . Hypercalcemia    parathyroidectomy 04/2008  . Hyperlipidemia   . Hypertension   . Iron deficiency anemia   . Polyp, stomach   . Pre-diabetes   . Wears dentures   . Wears glasses     Past Surgical History:  Procedure Laterality Date  . A-FLUTTER ABLATION N/A 08/12/2017   Procedure: A-FLUTTER ABLATION;  Surgeon: Deboraha Sprang, MD;  Location: [facility];  Service: Cardiovascular;  Laterality: N/A;  . APPENDECTOMY    . BIOPSY  09/20/2018   Procedure: BIOPSY;  Surgeon: Rush Landmark Telford Nab., MD;  Location: [place];  Service: Gastroenterology;;  . CATARACT EXTRACTION Right   . COLONOSCOPY  12/16/2011   Procedure: COLONOSCOPY;  Surgeon: Inda Castle, MD;  Location: [facility];  Service: Endoscopy;  Laterality: N/A;  . ENDOSCOPIC MUCOSAL RESECTION N/A 09/20/2018   Procedure: ENDOSCOPIC MUCOSAL RESECTION;  Surgeon: Rush Landmark Telford Nab., MD;  Location: [place];  Service: Gastroenterology;  Laterality: N/A;  . ENDOSCOPIC MUCOSAL RESECTION  01/31/2019   Procedure: ENDOSCOPIC MUCOSAL RESECTION;  Surgeon: Rush Landmark Telford Nab., MD;   Location: [facility];  Service: Gastroenterology;;  . ESOPHAGOGASTRODUODENOSCOPY N/A 01/17/2013   Procedure: ESOPHAGOGASTRODUODENOSCOPY (EGD);  Surgeon: Inda Castle, MD;  Location: [facility];  Service: Endoscopy;  Laterality: N/A;  . ESOPHAGOGASTRODUODENOSCOPY (EGD) WITH PROPOFOL N/A 09/20/2018   Procedure: ESOPHAGOGASTRODUODENOSCOPY (EGD) WITH PROPOFOL;  Surgeon: Rush Landmark Telford Nab., MD;  Location: [place];  Service: Gastroenterology;  Laterality: N/A;  . ESOPHAGOGASTRODUODENOSCOPY (EGD) WITH PROPOFOL N/A 01/31/2019   Procedure: ESOPHAGOGASTRODUODENOSCOPY (EGD) WITH PROPOFOL;  Surgeon: Rush Landmark Telford Nab., MD;  Location: [place];  Service: Gastroenterology;  Laterality: N/A;  . ESOPHAGOGASTRODUODENOSCOPY (EGD) WITH PROPOFOL N/A 04/04/2020   Procedure: ESOPHAGOGASTRODUODENOSCOPY (EGD) WITH PROPOFOL;  Surgeon: Rush Landmark Telford Nab., MD;  Location: [facility];  Service: Gastroenterology;  Laterality: N/A;  . EUS N/A 01/31/2019   Procedure: UPPER ENDOSCOPIC ULTRASOUND (EUS) RADIAL;  Surgeon: Rush Landmark Telford Nab., MD;  Location: [place];  Service: Gastroenterology;  Laterality: N/A;  . EUS N/A 04/04/2020   Procedure: UPPER ENDOSCOPIC ULTRASOUND (EUS) RADIAL;  Surgeon: Rush Landmark Telford Nab., MD;  Location: [facility];  Service: Gastroenterology;  Laterality: N/A;  . EXTERNAL EAR SURGERY  01/2016   BCC removal  . EYE SURGERY  infant   "lazy eye"  . FINE NEEDLE ASPIRATION  01/31/2019   Procedure: FINE NEEDLE ASPIRATION (FNA) LINEAR;  Surgeon: Irving Copas., MD;  Location: [facility] ENDOSCOPY;  Service: Gastroenterology;;  . Otho Darner SIGMOIDOSCOPY  02/09/2012   Procedure: Beryle Quant;  Surgeon: Inda Castle, MD;  Location: [facility];  Service: Endoscopy;  Laterality: N/A;  . HEMORRHOID BANDING  02/09/2012   Procedure: HEMORRHOID BANDING;  Surgeon: Inda Castle, MD;  Location: [facility];  Service: Endoscopy;  Laterality: N/A;  .  HEMOSTASIS CLIP PLACEMENT  09/20/2018   Procedure: HEMOSTASIS CLIP PLACEMENT;  Surgeon: Irving Copas., MD;  Location: [place];  Service: Gastroenterology;;  . HEMOSTASIS CLIP PLACEMENT  01/31/2019   Procedure: HEMOSTASIS CLIP PLACEMENT;  Surgeon: Irving Copas., MD;  Location: [place];  Service: Gastroenterology;;  . HEMOSTASIS CLIP PLACEMENT  04/04/2020   Procedure: HEMOSTASIS CLIP PLACEMENT;  Surgeon: Irving Copas., MD;  Location: [facility];  Service: Gastroenterology;;  . HEMOSTASIS CONTROL  04/04/2020   Procedure: HEMOSTASIS CONTROL;  Surgeon: Irving Copas., MD;  Location: [facility];  Service: Gastroenterology;;  snare cautery  . HOT HEMOSTASIS N/A 01/17/2013   Procedure: HOT HEMOSTASIS (ARGON PLASMA COAGULATION/BICAP);  Surgeon: Inda Castle, MD;  Location: [facility];  Service: Endoscopy;  Laterality: N/A;  . KIDNEY STONE SURGERY    . MULTIPLE TOOTH EXTRACTIONS    . PARATHYROIDECTOMY  04/2008  . POLYPECTOMY  09/20/2018   Procedure: POLYPECTOMY;  Surgeon: Mansouraty, Telford Nab., MD;  Location: [place];  Service: Gastroenterology;;  . POLYPECTOMY  01/31/2019   Procedure: POLYPECTOMY;  Surgeon: Irving Copas., MD;  Location: [place];  Service: Gastroenterology;;  . POLYPECTOMY  04/04/2020   Procedure: POLYPECTOMY;  Surgeon: Irving Copas., MD;  Location: [facility];  Service: Gastroenterology;;  . Clide Deutscher  09/20/2018   Procedure: Clide Deutscher;  Surgeon: Irving Copas., MD;  Location: [place];  Service: Gastroenterology;;  . Clide Deutscher  01/31/2019   Procedure: Clide Deutscher;  Surgeon: Irving Copas., MD;  Location: [place];  Service: Gastroenterology;;  . Lia Foyer INJECTION  04/04/2020   Procedure: SUBMUCOSAL INJECTION;  Surgeon: Irving Copas., MD;  Location: [facility];  Service: Gastroenterology;;  EPI Injection  . TONSILLECTOMY      Allergies as of  04/09/2020      Reactions   Cucumber Extract Nausea And Vomiting   Sulfonamide Derivatives Other (See Comments)   Unknown reaction      Medication List       Accurate as of April 09, 2020  9:23 PM. If you have any questions, ask your nurse or doctor.        apixaban 5 MG Tabs tablet Commonly known as: Eliquis Take 1 tablet (5 mg total) by mouth 2 (two) times daily.   cholecalciferol 1000 units tablet Commonly known as: VITAMIN D Take 1,000 Units by mouth every evening.   ferrous sulfate 325 (65 FE) MG tablet Take 325 mg by mouth daily at 12 noon.   flecainide 50 MG tablet Commonly known as: TAMBOCOR Take 50 mg by mouth daily before breakfast. What changed: Another medication with the same name was changed. Make sure you understand how and when to take each.   flecainide 100 MG tablet Commonly known as: TAMBOCOR TAKE 1 TABLET BY MOUTH ONCE DAILY IN THE EVENING What changed:   how much to take  how to take this  when to take this  additional instructions   metoprolol succinate 50 MG 24 hr tablet Commonly known as: TOPROL-XL Take 1 tablet by mouth once daily   multivitamin with minerals Tabs tablet Take 1 tablet  by mouth daily at 12 noon.   NIFEdipine 60 MG 24 hr tablet Commonly known as: PROCARDIA XL/NIFEDICAL XL Take 1 tablet (60 mg total) by mouth daily.   omeprazole 40 MG capsule Commonly known as: PRILOSEC TAKE 1 CAPSULE BY MOUTH TWICE DAILY BEFORE MEAL(S) What changed: See the new instructions.   PRESERVISION AREDS 2 PO Take 1 tablet by mouth every evening.   Saw Palmetto 450 MG Caps Take 450 mg by mouth every evening.   simvastatin 40 MG tablet Commonly known as: ZOCOR TAKE 1 TABLET BY MOUTH AT BEDTIME What changed: when to take this          Objective:   Physical Exam BP 128/80 (BP Location: Left Arm, Patient Position: Sitting, Cuff Size: Normal)   Pulse (!) 59   Temp 97.9 F (36.6 C) (Oral)   Resp 18   Ht 5\' 8"  (1.727 m)   Wt  206 lb 8 oz (93.7 kg)   SpO2 97%   BMI 31.40 kg/m  General: Well developed, NAD, BMI noted Neck: No  thyromegaly  HEENT:  Normocephalic . Face symmetric, atraumatic Lungs:  CTA B Normal respiratory effort, no intercostal retractions, no accessory muscle use. Heart: Seems regular today. Abdomen:  Not distended, soft, non-tender. No rebound or rigidity.   Lower extremities: Edema at baseline, more noticeable on the left. Skin: Exposed areas without rash. Not pale. Not jaundice Neurologic:  alert & oriented X3.  Speech normal, gait appropriate for age and unassisted Strength symmetric and appropriate for age.  Psych: Cognition and judgment appear intact.  Cooperative with normal attention span and concentration.  Behavior appropriate. No anxious or depressed appearing.     Assessment       Assessment   DM HTN Hyperlipidemia Osteopenia T score -2.0 (2010), T score -1.6 (12-2014); T score -1.3 (03-2018). 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  PLAN Here for CPX DM, HTN, hyperlipidemia: On simvastatin, metoprolol, Procardia.  All seem well controlled, check FLP. Atrial tachycardia/flutter: At this point is asymptomatic, currently anticoagulated.  Check a  CBC Nosebleeds: See previous entries, had a lot of problems, at this point things are improving, had few minor nosebleeds that he can self control. GI: Recently had a EGD and EUS, see report.  Closely f/u  by GI RTC 6 months   This visit occurred during the SARS-CoV-2 public health emergency.  Safety protocols were in place, including screening questions prior to the visit, additional usage of staff PPE, and extensive cleaning of exam room while observing appropriate contact time as indicated for disinfecting solutions.

## 2020-04-09 NOTE — Progress Notes (Signed)
Pre visit review using our clinic review tool, if applicable. No additional management support is needed unless otherwise documented below in the visit note. 

## 2020-04-09 NOTE — Patient Instructions (Addendum)
Check the  blood pressure at least weekly BP GOAL is between 110/65 and  135/85.  Proceed with Shingrix injection at your convenience.  [place] the [employer].  GO TO THE LAB : Get the blood work     [place], [place] back for a checkup in 6 months

## 2020-04-09 NOTE — Assessment & Plan Note (Signed)
-   Td 09-2015 - Pneumonia shot: declined again today -s/p zostavax; shingrix discussed,  will think about it   -COVID VAX x3 - Had a flu shot -CCS: Cscope 03-2006, Bx adenomatous polyp (Dr Sharlett Iles) Cscope again 12-2011--- no further scopes at this point -Elevated PSA: Last OV with urology 12-2019, next in 1 year. -Recommend healthy diet -Labs: FLP, CBC

## 2020-04-17 ENCOUNTER — Ambulatory Visit (HOSPITAL_COMMUNITY)
Admission: RE | Admit: 2020-04-17 | Discharge: 2020-04-17 | Disposition: A | Discharge status: Home / Self Care | Payer: PPO | Source: Ambulatory Visit | Attending: Gastroenterology | Admitting: Gastroenterology

## 2020-04-17 ENCOUNTER — Other Ambulatory Visit: Payer: Self-pay

## 2020-04-17 DIAGNOSIS — K3189 Other diseases of stomach and duodenum: Secondary | ICD-10-CM | POA: Diagnosis not present

## 2020-04-17 DIAGNOSIS — K8020 Calculus of gallbladder without cholecystitis without obstruction: Secondary | ICD-10-CM | POA: Diagnosis not present

## 2020-04-17 DIAGNOSIS — Z8509 Personal history of malignant neoplasm of other digestive organs: Secondary | ICD-10-CM | POA: Insufficient documentation

## 2020-04-17 DIAGNOSIS — D3500 Benign neoplasm of unspecified adrenal gland: Secondary | ICD-10-CM | POA: Diagnosis not present

## 2020-04-17 DIAGNOSIS — K6389 Other specified diseases of intestine: Secondary | ICD-10-CM | POA: Diagnosis not present

## 2020-04-17 LAB — POCT I-STAT CREATININE: Creatinine, Ser: 1.2 mg/dL (ref 0.61–1.24)

## 2020-04-17 MED ORDER — IOHEXOL 300 MG/ML  SOLN
100.0000 mL | Freq: Once | INTRAMUSCULAR | Status: AC | PRN
  Administered 2020-04-17: 100 mL via INTRAVENOUS

## 2020-05-08 DIAGNOSIS — Z7901 Long term (current) use of anticoagulants: Secondary | ICD-10-CM | POA: Diagnosis not present

## 2020-05-08 DIAGNOSIS — R040 Epistaxis: Secondary | ICD-10-CM | POA: Diagnosis not present

## 2020-05-08 DIAGNOSIS — J342 Deviated nasal septum: Secondary | ICD-10-CM | POA: Diagnosis not present

## 2020-05-14 ENCOUNTER — Other Ambulatory Visit: Payer: Self-pay | Admitting: Internal Medicine

## 2020-05-16 ENCOUNTER — Telehealth: Payer: Self-pay | Admitting: Hematology

## 2020-05-16 NOTE — Telephone Encounter (Signed)
Contacted patient to schedule appointment per 03/08 schedule message. Patient stated he did not want to schedule follow up visit at this time.

## 2020-05-25 ENCOUNTER — Other Ambulatory Visit: Payer: Self-pay | Admitting: Internal Medicine

## 2020-05-28 MED ORDER — OMEPRAZOLE 40 MG PO CPDR: 40.0000 mg | DELAYED_RELEASE_CAPSULE | Freq: Two times a day (BID) | ORAL | 3 refills | Status: DC

## 2020-05-28 MED ORDER — SIMVASTATIN 40 MG PO TABS: 40.0000 mg | ORAL_TABLET | Freq: Every day | ORAL | 1 refills | Status: DC

## 2020-05-28 NOTE — Addendum Note (Signed)
Addended byDamita Dunnings D on: 05/28/2020 09:05 AM   Modules accepted: Orders

## 2020-05-29 ENCOUNTER — Other Ambulatory Visit: Payer: Self-pay | Admitting: Internal Medicine

## 2020-05-30 ENCOUNTER — Other Ambulatory Visit: Payer: Self-pay | Admitting: Internal Medicine

## 2020-05-30 DIAGNOSIS — I10 Essential (primary) hypertension: Secondary | ICD-10-CM

## 2020-05-31 ENCOUNTER — Encounter: Payer: Self-pay | Admitting: Internal Medicine

## 2020-07-17 ENCOUNTER — Encounter (HOSPITAL_COMMUNITY): Payer: Self-pay

## 2020-07-17 ENCOUNTER — Encounter: Payer: Self-pay | Admitting: Internal Medicine

## 2020-07-17 ENCOUNTER — Other Ambulatory Visit: Payer: Self-pay

## 2020-07-17 ENCOUNTER — Inpatient Hospital Stay (HOSPITAL_COMMUNITY)
Admission: EM | Admit: 2020-07-17 | Discharge: 2020-07-20 | DRG: 378 | Disposition: A | Discharge status: Home / Self Care | Payer: PPO | Attending: Internal Medicine | Admitting: Internal Medicine

## 2020-07-17 DIAGNOSIS — K5909 Other constipation: Secondary | ICD-10-CM | POA: Diagnosis present

## 2020-07-17 DIAGNOSIS — K922 Gastrointestinal hemorrhage, unspecified: Secondary | ICD-10-CM | POA: Diagnosis not present

## 2020-07-17 DIAGNOSIS — Z882 Allergy status to sulfonamides status: Secondary | ICD-10-CM

## 2020-07-17 DIAGNOSIS — M19049 Primary osteoarthritis, unspecified hand: Secondary | ICD-10-CM | POA: Diagnosis not present

## 2020-07-17 DIAGNOSIS — Z20822 Contact with and (suspected) exposure to covid-19: Secondary | ICD-10-CM | POA: Diagnosis not present

## 2020-07-17 DIAGNOSIS — I10 Essential (primary) hypertension: Secondary | ICD-10-CM | POA: Diagnosis not present

## 2020-07-17 DIAGNOSIS — N200 Calculus of kidney: Secondary | ICD-10-CM

## 2020-07-17 DIAGNOSIS — R58 Hemorrhage, not elsewhere classified: Secondary | ICD-10-CM | POA: Diagnosis not present

## 2020-07-17 DIAGNOSIS — Z8249 Family history of ischemic heart disease and other diseases of the circulatory system: Secondary | ICD-10-CM | POA: Diagnosis not present

## 2020-07-17 DIAGNOSIS — C49A2 Gastrointestinal stromal tumor of stomach: Secondary | ICD-10-CM | POA: Diagnosis not present

## 2020-07-17 DIAGNOSIS — I4892 Unspecified atrial flutter: Secondary | ICD-10-CM | POA: Diagnosis not present

## 2020-07-17 DIAGNOSIS — Z87891 Personal history of nicotine dependence: Secondary | ICD-10-CM

## 2020-07-17 DIAGNOSIS — K5731 Diverticulosis of large intestine without perforation or abscess with bleeding: Principal | ICD-10-CM | POA: Diagnosis present

## 2020-07-17 DIAGNOSIS — N179 Acute kidney failure, unspecified: Secondary | ICD-10-CM | POA: Diagnosis present

## 2020-07-17 DIAGNOSIS — R0902 Hypoxemia: Secondary | ICD-10-CM | POA: Diagnosis not present

## 2020-07-17 DIAGNOSIS — K635 Polyp of colon: Secondary | ICD-10-CM | POA: Diagnosis not present

## 2020-07-17 DIAGNOSIS — Z87442 Personal history of urinary calculi: Secondary | ICD-10-CM

## 2020-07-17 DIAGNOSIS — K625 Hemorrhage of anus and rectum: Secondary | ICD-10-CM

## 2020-07-17 DIAGNOSIS — K649 Unspecified hemorrhoids: Secondary | ICD-10-CM | POA: Diagnosis not present

## 2020-07-17 DIAGNOSIS — K219 Gastro-esophageal reflux disease without esophagitis: Secondary | ICD-10-CM | POA: Diagnosis present

## 2020-07-17 DIAGNOSIS — Z79899 Other long term (current) drug therapy: Secondary | ICD-10-CM

## 2020-07-17 DIAGNOSIS — E119 Type 2 diabetes mellitus without complications: Secondary | ICD-10-CM | POA: Diagnosis present

## 2020-07-17 DIAGNOSIS — Z7901 Long term (current) use of anticoagulants: Secondary | ICD-10-CM | POA: Diagnosis not present

## 2020-07-17 DIAGNOSIS — I471 Supraventricular tachycardia: Secondary | ICD-10-CM | POA: Diagnosis present

## 2020-07-17 DIAGNOSIS — I4891 Unspecified atrial fibrillation: Secondary | ICD-10-CM | POA: Diagnosis present

## 2020-07-17 DIAGNOSIS — Z85828 Personal history of other malignant neoplasm of skin: Secondary | ICD-10-CM

## 2020-07-17 DIAGNOSIS — D122 Benign neoplasm of ascending colon: Secondary | ICD-10-CM | POA: Diagnosis not present

## 2020-07-17 DIAGNOSIS — D509 Iron deficiency anemia, unspecified: Secondary | ICD-10-CM | POA: Diagnosis not present

## 2020-07-17 DIAGNOSIS — I4819 Other persistent atrial fibrillation: Secondary | ICD-10-CM | POA: Diagnosis not present

## 2020-07-17 DIAGNOSIS — Z8719 Personal history of other diseases of the digestive system: Secondary | ICD-10-CM

## 2020-07-17 DIAGNOSIS — K862 Cyst of pancreas: Secondary | ICD-10-CM | POA: Diagnosis not present

## 2020-07-17 DIAGNOSIS — Z85831 Personal history of malignant neoplasm of soft tissue: Secondary | ICD-10-CM

## 2020-07-17 DIAGNOSIS — Z9109 Other allergy status, other than to drugs and biological substances: Secondary | ICD-10-CM | POA: Diagnosis not present

## 2020-07-17 DIAGNOSIS — K648 Other hemorrhoids: Secondary | ICD-10-CM | POA: Diagnosis not present

## 2020-07-17 DIAGNOSIS — D123 Benign neoplasm of transverse colon: Secondary | ICD-10-CM | POA: Diagnosis not present

## 2020-07-17 DIAGNOSIS — E785 Hyperlipidemia, unspecified: Secondary | ICD-10-CM | POA: Diagnosis present

## 2020-07-17 DIAGNOSIS — I480 Paroxysmal atrial fibrillation: Secondary | ICD-10-CM | POA: Diagnosis present

## 2020-07-17 DIAGNOSIS — N400 Enlarged prostate without lower urinary tract symptoms: Secondary | ICD-10-CM | POA: Diagnosis not present

## 2020-07-17 LAB — COMPREHENSIVE METABOLIC PANEL
ALT: 18 U/L (ref 0–44)
AST: 29 U/L (ref 15–41)
Albumin: 3.4 g/dL — ABNORMAL LOW (ref 3.5–5.0)
Alkaline Phosphatase: 59 U/L (ref 38–126)
Anion gap: 8 (ref 5–15)
BUN: 28 mg/dL — ABNORMAL HIGH (ref 8–23)
CO2: 26 mmol/L (ref 22–32)
Calcium: 8.6 mg/dL — ABNORMAL LOW (ref 8.9–10.3)
Chloride: 104 mmol/L (ref 98–111)
Creatinine, Ser: 1.56 mg/dL — ABNORMAL HIGH (ref 0.61–1.24)
GFR, Estimated: 44 mL/min — ABNORMAL LOW (ref >60)
Glucose, Bld: 128 mg/dL — ABNORMAL HIGH (ref 70–99)
Potassium: 4.9 mmol/L (ref 3.5–5.1)
Sodium: 138 mmol/L (ref 135–145)
Total Bilirubin: 1.4 mg/dL — ABNORMAL HIGH (ref 0.3–1.2)
Total Protein: 6.6 g/dL (ref 6.5–8.1)

## 2020-07-17 LAB — CBC WITH DIFFERENTIAL/PLATELET
Abs Immature Granulocytes: 0.07 10*3/uL (ref 0.00–0.07)
Basophils Absolute: 0.1 10*3/uL (ref 0.0–0.1)
Basophils Relative: 0 %
Eosinophils Absolute: 0.1 10*3/uL (ref 0.0–0.5)
Eosinophils Relative: 1 %
HCT: 46.3 % (ref 39.0–52.0)
Hemoglobin: 15.3 g/dL (ref 13.0–17.0)
Immature Granulocytes: 1 %
Lymphocytes Relative: 13 %
Lymphs Abs: 1.6 10*3/uL (ref 0.7–4.0)
MCH: 30.6 pg (ref 26.0–34.0)
MCHC: 33 g/dL (ref 30.0–36.0)
MCV: 92.6 fL (ref 80.0–100.0)
Monocytes Absolute: 0.7 10*3/uL (ref 0.1–1.0)
Monocytes Relative: 5 %
Neutro Abs: 10.6 10*3/uL — ABNORMAL HIGH (ref 1.7–7.7)
Neutrophils Relative %: 80 %
Platelets: 214 10*3/uL (ref 150–400)
RBC: 5 MIL/uL (ref 4.22–5.81)
RDW: 13.4 % (ref 11.5–15.5)
WBC: 13.1 10*3/uL — ABNORMAL HIGH (ref 4.0–10.5)
nRBC: 0 % (ref 0.0–0.2)

## 2020-07-17 LAB — POC OCCULT BLOOD, ED: Fecal Occult Bld: POSITIVE — AB

## 2020-07-17 LAB — PROTIME-INR
INR: 1.2 (ref 0.8–1.2)
Prothrombin Time: 14.7 seconds (ref 11.4–15.2)

## 2020-07-17 LAB — RESP PANEL BY RT-PCR (FLU A&B, COVID) ARPGX2
Influenza A by PCR: NEGATIVE
Influenza B by PCR: NEGATIVE
SARS Coronavirus 2 by RT PCR: NEGATIVE

## 2020-07-17 LAB — TYPE AND SCREEN
ABO/RH(D): O POS
Antibody Screen: NEGATIVE

## 2020-07-17 MED ORDER — SIMVASTATIN 40 MG PO TABS
40.0000 mg | ORAL_TABLET | Freq: Every day | ORAL | Status: DC
  Administered 2020-07-18 – 2020-07-19 (×3): 40 mg via ORAL
  Filled 2020-07-17 (×3): qty 1

## 2020-07-17 MED ORDER — SODIUM CHLORIDE 0.9 % IV SOLN
80.0000 mg | Freq: Once | INTRAVENOUS | Status: AC
  Administered 2020-07-17: 80 mg via INTRAVENOUS
  Filled 2020-07-17: qty 80

## 2020-07-17 MED ORDER — METOPROLOL SUCCINATE ER 50 MG PO TB24
50.0000 mg | ORAL_TABLET | Freq: Every day | ORAL | Status: DC
  Administered 2020-07-18: 50 mg via ORAL
  Filled 2020-07-17 (×2): qty 1

## 2020-07-17 MED ORDER — ONDANSETRON HCL 4 MG/2ML IJ SOLN: 4.0000 mg | Freq: Four times a day (QID) | INTRAMUSCULAR | Status: DC | PRN

## 2020-07-17 MED ORDER — FLECAINIDE ACETATE 100 MG PO TABS
100.0000 mg | ORAL_TABLET | Freq: Every evening | ORAL | Status: DC
  Administered 2020-07-18 – 2020-07-19 (×3): 100 mg via ORAL
  Filled 2020-07-17 (×3): qty 1

## 2020-07-17 MED ORDER — PANTOPRAZOLE SODIUM 40 MG PO TBEC
80.0000 mg | DELAYED_RELEASE_TABLET | Freq: Every day | ORAL | Status: DC
  Administered 2020-07-18: 80 mg via ORAL
  Filled 2020-07-17 (×2): qty 2

## 2020-07-17 MED ORDER — ACETAMINOPHEN 325 MG PO TABS
650.0000 mg | ORAL_TABLET | Freq: Four times a day (QID) | ORAL | Status: DC | PRN
  Filled 2020-07-17: qty 2

## 2020-07-17 MED ORDER — ONDANSETRON HCL 4 MG PO TABS: 4.0000 mg | ORAL_TABLET | Freq: Four times a day (QID) | ORAL | Status: DC | PRN

## 2020-07-17 MED ORDER — FLECAINIDE ACETATE 50 MG PO TABS
50.0000 mg | ORAL_TABLET | Freq: Every morning | ORAL | Status: DC
  Administered 2020-07-18 – 2020-07-20 (×3): 50 mg via ORAL
  Filled 2020-07-17 (×3): qty 1

## 2020-07-17 MED ORDER — ACETAMINOPHEN 650 MG RE SUPP: 650.0000 mg | Freq: Four times a day (QID) | RECTAL | Status: DC | PRN

## 2020-07-17 MED ORDER — LACTATED RINGERS IV SOLN: INTRAVENOUS | Status: DC

## 2020-07-17 NOTE — ED Triage Notes (Signed)
Pt BIB EMS from home. Pt lives alone. Pt c/o rectal hemorrhage since 1100 today. Pt c/o dizziness and weakness and called EMS. EMS reports 50-100 mL of blood in toilet on scene. Pt has hx of A fib and takes Eliquis. Pt A&Ox4.   138/91 74 HR 94% RA

## 2020-07-17 NOTE — ED Provider Notes (Signed)
[facility] DEPT Provider Note   CSN: [id number] Arrival date & time: 07/17/20  1916     History Chief Complaint  Patient presents with  . Rectal Bleeding    Ronald [name] is a 86 y.o. male.  86 year old male with prior medical history as detailed below presents for evaluation.  Patient reports grossly blood from rectum.  Onset of bleeding was this morning around 11 AM.  Patient reports multiple episodes over the course of the day.  Patient is on Eliquis.  Patient denies abdominal pain or fever.  Patient denies vomiting.  Patient denies prior history of significant GI bleed requiring transfusion.  Patient is known to [facility].  The history is provided by the patient and medical records.  Rectal Bleeding Quality:  Bright red Amount:  Moderate Duration:  1 day Timing:  Constant Chronicity:  New Similar prior episodes: no   Relieved by:  Nothing Worsened by:  Defecation      Past Medical History:  Diagnosis Date  . Arthritis    one finger  . BPH (benign prostatic hypertrophy)    f/u @ Biopsy, s/p bx 2009 apro (-)  . Cataract   . Colon polyp   . Complication of anesthesia    violent upon waking up (only 1 time)   . Diverticulosis   . Dysrhythmia    A-flutter/Afib  . Early cataract    left  . Edema leg    asymetric , L > R , [place] neg for DVT 09-2010  . Gastric polyps   . GERD (gastroesophageal reflux disease)   . Hemorrhoids   . History of kidney stones   . Hypercalcemia    parathyroidectomy 04/2008  . Hyperlipidemia   . Hypertension   . Iron deficiency anemia   . Polyp, stomach   . Pre-diabetes   . Wears dentures   . Wears glasses     Patient Active Problem List   Diagnosis Date Noted  . Syncope 02/10/2020  . 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 A-fib ([place]) 05/22/2019  . Atrial tachycardia ([place]) 05/22/2019  . Genetic testing 03/14/2019  . Gastric polyps   . 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 Lung nodule 11/19/2017  . Atrial flutter ([place]) 08/09/2016  . PCP NOTES >>>>>>>>>>>>> 12/02/2014  . 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 Personal history of colonic polyps 12/01/2011  . Annual physical exam 09/23/2010  . Elevated PSA 04/12/2008  . ERECTILE DYSFUNCTION 05/25/2007  . DMII (diabetes mellitus, type 2) ([place]) 03/02/2007  . CALCULUS OF KIDNEY 02/15/2007  . HYPERPARATHYROIDISM UNSPECIFIED 11/16/2006  . HYPERLIPIDEMIA 11/11/2006  . Essential hypertension 11/11/2006  . GERD 11/11/2006    Past Surgical History:  Procedure Laterality Date  . A-FLUTTER ABLATION N/A 08/12/2017   Procedure: A-FLUTTER ABLATION;  Surgeon: Deboraha Sprang, MD;  Location: [facility];  Service: Cardiovascular;  Laterality: N/A;  . APPENDECTOMY    . BIOPSY  09/20/2018   Procedure: BIOPSY;  Surgeon: Rush Landmark Telford Nab., MD;  Location: [place];  Service: Gastroenterology;;  . CATARACT EXTRACTION Right   . COLONOSCOPY  12/16/2011   Procedure: COLONOSCOPY;  Surgeon: Inda Castle, MD;  Location: [facility];  Service: Endoscopy;  Laterality: N/A;  . ENDOSCOPIC MUCOSAL RESECTION N/A 09/20/2018   Procedure: ENDOSCOPIC MUCOSAL RESECTION;  Surgeon: Rush Landmark Telford Nab., MD;  Location: [place];  Service: Gastroenterology;  Laterality: N/A;  . ENDOSCOPIC MUCOSAL RESECTION  01/31/2019   Procedure: ENDOSCOPIC MUCOSAL RESECTION;  Surgeon: Irving Copas., MD;  Location: [facility];  Service: Gastroenterology;;  . ESOPHAGOGASTRODUODENOSCOPY N/A 01/17/2013   Procedure: ESOPHAGOGASTRODUODENOSCOPY (EGD);  Surgeon: Inda Castle, MD;  Location[facility];  Service: Endoscopy;  Laterality: N/A;  . ESOPHAGOGASTRODUODENOSCOPY (EGD) WITH PROPOFOL N/A 09/20/2018   Procedure: ESOPHAGOGASTRODUODENOSCOPY (EGD) WITH PROPOFOL;  Surgeon: Rush Landmark Telford Nab., MD;  Location: [place];  Service: Gastroenterology;  Laterality: N/A;  . ESOPHAGOGASTRODUODENOSCOPY (EGD) WITH PROPOFOL N/A  01/31/2019   Procedure: ESOPHAGOGASTRODUODENOSCOPY (EGD) WITH PROPOFOL;  Surgeon: Rush Landmark Telford Nab., MD;  Location: [place];  Service: Gastroenterology;  Laterality: N/A;  . ESOPHAGOGASTRODUODENOSCOPY (EGD) WITH PROPOFOL N/A 04/04/2020   Procedure: ESOPHAGOGASTRODUODENOSCOPY (EGD) WITH PROPOFOL;  Surgeon: Rush Landmark Telford Nab., MD;  [facility];  Service: Gastroenterology;  Laterality: N/A;  . EUS N/A 01/31/2019   Procedure: UPPER ENDOSCOPIC ULTRASOUND (EUS) RADIAL;  Surgeon: Rush Landmark Telford Nab., MD;  Location: [place];  Service: Gastroenterology;  Laterality: N/A;  . EUS N/A 04/04/2020   Procedure: UPPER ENDOSCOPIC ULTRASOUND (EUS) RADIAL;  Surgeon: Rush Landmark Telford Nab., MD;  [facility];  Service: Gastroenterology;  Laterality: N/A;  . EXTERNAL EAR SURGERY  01/2016   BCC removal  . EYE SURGERY  infant   "lazy eye"  . FINE NEEDLE ASPIRATION  01/31/2019   Procedure: FINE NEEDLE ASPIRATION (FNA) LINEAR;  Surgeon: Irving Copas., MD;  Location: [facility] END[facility]COPY;  Service: Gastroenterology;;  . Otho Darner SIGMOIDOSCOPY  02/09/2012   Procedure: FLEXIBLE SIGMOIDOSCOPY;  Surgeon: Inda Castle, MD;  [facility];  Service: Endoscopy;  Laterality: N/A;  . HEMORRHOID BANDING  02/09/2012   Procedure: HEMORRHOID BANDING;  Surgeon: Inda Castle, MD;  [facility];  Service: Endoscopy;  Laterality: N/A;  . HEMOSTASIS CLIP PLACEMENT  09/20/2018   Procedure: HEMOSTASIS CLIP PLACEMENT;  Surgeon: Irving Copas., MD;  Location: [place];  Service: Gastroenterology;;  . HEMOSTASIS CLIP PLACEMENT  01/31/2019   Procedure: HEMOSTASIS CLIP PLACEMENT;  Surgeon: Irving Copas., MD;  Location: [place];  Service: Gastroenterology;;  . HEMOSTASIS CLIP PLACEMENT  04/04/2020   Procedure: HEMOSTASIS CLIP PLACEMENT;  Surgeon: Irving Copas., MD;  Location: [facility];  Service: Gastroenterology;;  . HEMOSTASIS CONTROL   04/04/2020   Procedure: HEMOSTASIS CONTROL;  Surgeon: Irving Copas., MD;  [facility];  Service: Gastroenterology;;  snare cautery  . HOT HEMOSTASIS N/A 01/17/2013   Procedure: HOT HEMOSTASIS (ARGON PLASMA COAGULATION/BICAP);  Surgeon: Inda Castle, MD;  Location: [facility];  Service: Endoscopy;  Laterality: N/A;  . KIDNEY STONE SURGERY    . MULTIPLE TOOTH EXTRACTIONS    . PARATHYROIDECTOMY  04/2008  . POLYPECTOMY  09/20/2018   Procedure: POLYPECTOMY;  Surgeon: Mansouraty, Telford Nab., MD;  Location: [place];  Service: Gastroenterology;;  . POLYPECTOMY  01/31/2019   Procedure: POLYPECTOMY;  Surgeon: Irving Copas., MD;  Location: [place];  Service: Gastroenterology;;  . POLYPECTOMY  04/04/2020   Procedure: POLYPECTOMY;  Surgeon: Irving Copas., MD;  Location: [facility];  Service: Gastroenterology;;  . Clide Deutscher  09/20/2018   Procedure: Clide Deutscher;  Surgeon: Irving Copas., MD;  Location: [place];  Service: Gastroenterology;;  . Clide Deutscher  01/31/2019   Procedure: Clide Deutscher;  Surgeon: Irving Copas., MD;  Location: [place];  Service: Gastroenterology;;  . Lia Foyer INJECTION  04/04/2020   Procedure: SUBMUCOSAL INJECTION;  Surgeon: Irving Copas., MD;  Location: [facility];  Service: Gastroenterology;;  EPI Injection  . TONSILLECTOMY         Family History  Problem  Relation Age of Onset  . Heart attack Father 43  . Heart attack Mother   . Pneumonia Sister 6  . Colon cancer Neg Hx   . Prostate cancer Neg Hx   . Diabetes Neg Hx     Social History   Tobacco Use  . Smoking status: Former Smoker    Packs/day: 0.50    Years: 25.00    Pack years: 12.50    Types: Cigarettes    Quit date: 11/30/1981    Years since quitting: 38.6  . Smokeless tobacco: Never Used  Vaping Use  . Vaping Use: Never used  Substance Use Topics  . Alcohol use: Yes    Alcohol/week: 7.0 standard  drinks    Types: 7 Glasses of wine per week    Comment: occasional beer with dinner  . Drug use: No    Home Medications Prior to Admission medications   Medication Sig Start Date End Date Taking? Authorizing Provider  apixaban (ELIQUIS) 5 MG TABS tablet Take 1 tablet (5 mg total) by mouth 2 (two) times daily. 04/07/20   Mansouraty, Telford Nab., MD  cholecalciferol (VITAMIN D) 1000 UNITS tablet Take 1,000 Units by mouth every evening.     [provider]  ferrous sulfate 325 (65 FE) MG tablet Take 325 mg by mouth daily at 12 noon.    [provider]  flecainide (TAMBOCOR) 100 MG tablet Take 1 tablet (100 mg total) by mouth every evening. 05/14/20   Deboraha Sprang, MD  flecainide (TAMBOCOR) 50 MG tablet TAKE 1 TABLET BY MOUTH ONCE DAILY IN THE MORNING 05/29/20   Deboraha Sprang, MD  metoprolol succinate (TOPROL-XL) 50 MG 24 hr tablet Take 1 tablet by mouth once daily 05/29/20   Deboraha Sprang, MD  Multiple Vitamin (MULTIVITAMIN WITH MINERALS) TABS tablet Take 1 tablet by mouth daily at 12 noon.    [provider]  Multiple Vitamins-Minerals (PRESERVISION AREDS 2 PO) Take 1 tablet by mouth every evening.    [provider]  NIFEdipine (PROCARDIA XL/NIFEDICAL XL) 60 MG 24 hr tablet Take 1 tablet (60 mg total) by mouth daily. 05/30/20   Colon Branch, MD  omeprazole (PRILOSEC) 40 MG capsule Take 1 capsule (40 mg total) by mouth 2 (two) times daily before a meal. 05/28/20   Paz, Alda Berthold, MD  Saw Palmetto 450 MG CAPS Take 450 mg by mouth every evening.    [provider]  simvastatin (ZOCOR) 40 MG tablet Take 1 tablet (40 mg total) by mouth at bedtime. 05/28/20   Colon Branch, MD    Allergies    Cucumber extract and Sulfonamide derivatives  Review of Systems   Review of Systems  Gastrointestinal: Positive for hematochezia.  All other systems reviewed and are negative.   Physical Exam Updated Vital Signs BP 126/80   Pulse 66   Temp 97.9 F (36.6 C)  (Oral)   Resp (!) 21   SpO2 96%   Physical Exam Vitals and nursing note reviewed.  Constitutional:      General: He is not in acute distress.    Appearance: Normal appearance. He is well-developed.  HENT:     Head: Normocephalic and atraumatic.  Eyes:     Conjunctiva/sclera: Conjunctivae normal.     Pupils: Pupils are equal, round, and reactive to light.  Cardiovascular:     Rate and Rhythm: Normal rate and regular rhythm.     Heart sounds: Normal heart sounds.  Pulmonary:  Effort: Pulmonary effort is normal. No respiratory distress.     Breath sounds: Normal breath sounds.  Abdominal:     General: There is no distension.     Palpations: Abdomen is soft.     Tenderness: There is no abdominal tenderness.  Musculoskeletal:        General: No deformity. Normal range of motion.     Cervical back: Normal range of motion and neck supple.  Skin:    General: Skin is warm and dry.  Neurological:     Mental Status: He is alert and oriented to person, place, and time.     ED Results / Procedures / Treatments   Labs (all labs ordered are listed, but only abnormal results are displayed) Labs Reviewed  RESP PANEL BY RT-PCR (FLU A&B, COVID) [id number]  PROTIME-INR  CBC WITH DIFFERENTIAL/PLATELET  COMPREHENSIVE METABOLIC PANEL  POC OCCULT BLOOD, ED  TYPE AND SCREEN    EKG EKG Interpretation  Date/Time:  Tuesday Jul 17 2020 20:10:28 EDT Ventricular Rate:  63 PR Interval:  169 QRS Duration: 109 QT Interval:  437 QTC Calculation: 448 R Axis:   -74 Text Interpretation: Sinus rhythm Left anterior fascicular block Abnormal R-wave progression, late transition Confirmed by Dene Gentry [phone]) on 07/17/2020 8:14:48 PM   Radiology No results found.  Procedures Procedures   Medications Ordered in ED Medications  pantoprazole (PROTONIX) 80 mg in sodium chloride 0.9 % 100 mL IVPB (has no administration in time range)    ED Course  I have reviewed the triage vital signs and  the nursing notes.  Pertinent labs & imaging results that were available during my care of the patient were reviewed by me and considered in my medical decision making (see chart for details).    MDM Rules/Calculators/A&P                          MDM  MSE complete  Ronald [name] was evaluated in Emergency Department on 07/17/2020 for the symptoms described in the history of present illness. He was evaluated in the context of the global COVID-19 pandemic, which necessitated consideration that the patient might be at risk for infection with the SARS-CoV-2 virus that causes COVID-19. Institutional protocols and algorithms that pertain to the evaluation of patients at risk for COVID-19 are in a state of rapid change based on information released by regulatory bodies including the CDC and federal and state organizations. These policies and algorithms were followed during the patient's care in the ED.   Patient presents for evaluation of reported rectal bleeding.  Patient's initial hemodynamics and hemoglobin are without evidence of significant blood loss.  Patient is on Eliquis.  Case discussed with Dr. Modena Nunnery of [facility].  She is aware of consult.  Patient would benefit from overnight observation for further treatment.  Hospitalist service is aware of case and will evaluate for admission.   Final Clinical Impression(s) / ED Diagnoses Final diagnoses:  Rectal bleeding    Rx / DC Orders ED Discharge Orders    None       Valarie Merino, MD 07/17/20 2143

## 2020-07-17 NOTE — ED Notes (Signed)
ED TO INPATIENT HANDOFF REPORT  Name/Age/Gender Ronald [name] 86 y.o. male  Code Status Code Status History    Date Active Date Inactive Code Status Order ID Comments User Context   02/10/2020 2035 02/11/2020 2326 Full Code [id number]  Kayleen Memos, DO ED   08/12/2017 1003 08/12/2017 2023 Full Code [id number]  Deboraha Sprang, MD Inpatient   01/17/2013 1523 01/18/2013 1405 Full Code [id number]  Willia Craze, NP Inpatient   Advance Care Planning Activity    Questions for Most Recent Historical Code Status (Order [id number])       Home/SNF/Other Home  Chief Complaint Acute lower GI bleeding [K92.2]  Level of Care/Admitting Diagnosis ED Disposition    ED Disposition Condition [facility] Area: [place] [100102]  Level of Care: Progressive [102]  Admit to Progressive based on following criteria: GI, ENDOCRINE disease patients with GI bleeding, acute liver failure or pancreatitis, stable with diabetic ketoacidosis or thyrotoxicosis (hypothyroid) state.  May admit patient to [facility] or [facility] if equivalent level of care is available:: Yes  Covid Evaluation: Confirmed COVID Negative  Diagnosis: Acute lower GI bleeding [287867]  Admitting Physician: Eben Burow [[id number]]  Attending Physician: Eben Burow [[id number]]  Estimated length of stay: past midnight tomorrow  Certification:: I certify this patient will need inpatient services for at least 2 midnights       Medical History Past Medical History:  Diagnosis Date  . Arthritis    one finger  . BPH (benign prostatic hypertrophy)    f/u @ Biopsy, s/p bx 2009 apro (-)  . Cataract   . Colon polyp   . Complication of anesthesia    violent upon waking up (only 1 time)   . Diverticulosis   . Dysrhythmia    A-flutter/Afib  . Early cataract    left  . Edema leg    asymetric , L > R , [place] neg for DVT 09-2010  . Gastric polyps   . GERD (gastroesophageal reflux disease)    . Hemorrhoids   . History of kidney stones   . Hypercalcemia    parathyroidectomy 04/2008  . Hyperlipidemia   . Hypertension   . Iron deficiency anemia   . Polyp, stomach   . Pre-diabetes   . Wears dentures   . Wears glasses     Allergies Allergies  Allergen Reactions  . Cucumber Extract Nausea And Vomiting  . Sulfonamide Derivatives Other (See Comments)    Unknown reaction    IV Location/Drains/Wounds Patient Lines/Drains/Airways Status    Active Line/Drains/Airways    Name Placement date Placement time Site Days   Peripheral IV 04/17/20 Left Antecubital 04/17/20  0905  Antecubital  91   Peripheral IV 07/17/20 Left Antecubital 07/17/20  2032  Antecubital  less than 1          Labs/Imaging Results for orders placed or performed during the hospital encounter of 07/17/20 (from the past 48 hour(s))  Protime-INR     Status: None   Collection Time: 07/17/20  8:33 PM  Result Value Ref Range   Prothrombin Time 14.7 11.4 - 15.2 seconds   INR 1.2 0.8 - 1.2    Comment: (NOTE) INR goal varies based on device and disease states. Performed at [facility], [place] [address] [place], [place] [address]   CBC with Differential     Status: Abnormal   Collection Time: 07/17/20  8:33 PM  Result Value Ref Range  WBC 13.1 (H) 4.0 - 10.5 K/uL   RBC 5.00 4.22 - 5.81 MIL/uL   Hemoglobin 15.3 13.0 - 17.0 g/dL   HCT 46.3 39.0 - 52.0 %   MCV 92.6 80.0 - 100.0 fL   MCH 30.6 26.0 - 34.0 pg   MCHC 33.0 30.0 - 36.0 g/dL   RDW 13.4 11.5 - 15.5 %   Platelets 214 150 - 400 K/uL   nRBC 0.0 0.0 - 0.2 %   Neutrophils Relative % 80 %   Neutro Abs 10.6 (H) 1.7 - 7.7 K/uL   Lymphocytes Relative 13 %   Lymphs Abs 1.6 0.7 - 4.0 K/uL   Monocytes Relative 5 %   Monocytes Absolute 0.7 0.1 - 1.0 K/uL   Eosinophils Relative 1 %   Eosinophils Absolute 0.1 0.0 - 0.5 K/uL   Basophils Relative 0 %   Basophils Absolute 0.1 0.0 - 0.1 K/uL   Immature Granulocytes 1 %   Abs Immature  Granulocytes 0.07 0.00 - 0.07 K/uL    Comment: Performed at [facility], [place] [address] [place], [place] [address]  Comprehensive metabolic panel     Status: Abnormal   Collection Time: 07/17/20  8:33 PM  Result Value Ref Range   Sodium 138 135 - 145 mmol/L   Potassium 4.9 3.5 - 5.1 mmol/L   Chloride 104 98 - 111 mmol/L   CO2 26 22 - 32 mmol/L   Glucose, Bld 128 (H) 70 - 99 mg/dL    Comment: Glucose reference range applies only to samples taken after fasting for at least 8 hours.   BUN 28 (H) 8 - 23 mg/dL   Creatinine, Ser 1.56 (H) 0.61 - 1.24 mg/dL   Calcium 8.6 (L) 8.9 - 10.3 mg/dL   Total Protein 6.6 6.5 - 8.1 g/dL   Albumin 3.4 (L) 3.5 - 5.0 g/dL   AST 29 15 - 41 U/L   ALT 18 0 - 44 U/L   Alkaline Phosphatase 59 38 - 126 U/L   Total Bilirubin 1.4 (H) 0.3 - 1.2 mg/dL   GFR, Estimated 44 (L) >60 mL/min    Comment: (NOTE) Calculated using the CKD-EPI Creatinine Equation (2021)    Anion gap 8 5 - 15    Comment: Performed at [facility], [place] [address], [place], [place] [address]  Resp Panel by RT-PCR (Flu A&B, Covid) Nasopharyngeal Swab     Status: None   Collection Time: 07/17/20  8:33 PM   Specimen: Nasopharyngeal Swab; Nasopharyngeal(NP) swabs in vial transport medium  Result Value Ref Range   SARS Coronavirus 2 by RT PCR NEGATIVE NEGATIVE    Comment: (NOTE) SARS-CoV-2 target nucleic acids are NOT DETECTED.  The SARS-CoV-2 RNA is generally detectable in upper respiratory specimens during the acute phase of infection. The lowest concentration of SARS-CoV-2 viral copies this assay can detect is 138 copies/mL. A negative result does not preclude SARS-Cov-2 infection and should not be used as the sole basis for treatment or other patient management decisions. A negative result may occur with  improper specimen collection/handling, submission of specimen other than nasopharyngeal swab, presence of viral mutation(s) within the areas  targeted by this assay, and inadequate number of viral copies(<138 copies/mL). A negative result must be combined with clinical observations, patient history, and epidemiological information. The expected result is Negative.  Fact Sheet for Patients:  EntrepreneurPulse.com.au  Fact Sheet for Healthcare Providers:  IncredibleEmployment.be  This test is no t yet approved or cleared by the  [place] Architectural technologist and  has been authorized for detection and/or diagnosis of SARS-CoV-2 by FDA under an Print production planner (EUA). This EUA will remain  in effect (meaning this test can be used) for the duration of the COVID-19 declaration under Section 564(b)(1) of the Act, 21 U.S.C.section 360bbb-3(b)(1), unless the authorization is terminated  or revoked sooner.       Influenza A by PCR NEGATIVE NEGATIVE   Influenza B by PCR NEGATIVE NEGATIVE    Comment: (NOTE) The Xpert Xpress SARS-CoV-2/FLU/RSV plus assay is intended as an aid in the diagnosis of influenza from Nasopharyngeal swab specimens and should not be used as a sole basis for treatment. Nasal washings and aspirates are unacceptable for Xpert Xpress SARS-CoV-2/FLU/RSV testing.  Fact Sheet for Patients: EntrepreneurPulse.com.au  Fact Sheet for Healthcare Providers: IncredibleEmployment.be  This test is not yet approved or cleared by the [place] FDA and has been authorized for detection and/or diagnosis of SARS-CoV-2 by FDA under an Emergency Use Authorization (EUA). This EUA will remain in effect (meaning this test can be used) for the duration of the COVID-19 declaration under Section 564(b)(1) of the Act, 21 U.S.C. section 360bbb-3(b)(1), unless the authorization is terminated or revoked.  Performed at [facility], [place] [address] [place], [place] [address]   POC occult blood, ED     Status: Abnormal   Collection Time:  07/17/20  8:51 PM  Result Value Ref Range   Fecal Occult Bld POSITIVE (A) NEGATIVE   No results found.  Pending Labs Unresulted Labs (From admission, onward)          Start     Ordered   07/17/20 2115  Type and screen Ordered by PROVIDER DEFAULT  Once,   R        07/17/20 2115          Vitals/Pain Today's Vitals   07/17/20 2030 07/17/20 2100 07/17/20 2130 07/17/20 2200  BP: 139/87 119/80 (!) 145/85 (!) 149/84  Pulse: 62 62 60 (!) 57  Resp: 20 19 12 19   Temp:      TempSrc:      SpO2: 96% 96% 97% 97%    Isolation Precautions No active isolations  Medications Medications  pantoprazole (PROTONIX) 80 mg in sodium chloride 0.9 % 100 mL IVPB (0 mg Intravenous Stopped 07/17/20 2130)    Mobility Ambulatory

## 2020-07-17 NOTE — H&P (Signed)
History and Physical    CAIDENCE [name] [id number] DOB: 22-Jul-1934 DOA: 07/17/2020  PCP: Colon Branch, MD   Patient coming from:  Home  Chief Complaint:    HPI: Ronald [name] is a 85 y.o. male with medical history significant for atrial fibrillation, HTN, HLD, GERD who presents for evaluation of lower GI bleeding. Had 3 episodes of bright red blood with clots that started this morning around 11 AM.  He has not had any abdominal or rectal pain.  He is on Eliquis secondary to atrial fibrillation.  He has not had any fever or chills.  He does have history of hemorrhoids.  He is followed by gastroenterology at [facility].  He has never had GI bleeding like this in the past.  He has never had have a blood transfusion he states.  Has not had any abdominal injury or trauma.  ED Course: Mr. Leve is hemodynamically stable in the emergency room.  18.3 hematocrit 46.3 with platelets of 214 and WBC 13.1.  Electrolytes are normal.  Creatinine is mildly elevated at 1.56 from a baseline of 1.20.  Hospital service asked to admit for further management  Review of Systems:  General: Denies fever, chills, weight loss, night sweats. Denies dizziness. Denies change in appetite HENT: Denies head trauma, headache, denies change in hearing, tinnitus. Denies nasal  bleeding.  Denies sore throat, sores in mouth.  Denies difficulty swallowing Eyes: Denies blurry vision, pain in eye, drainage.  Denies discoloration of eyes. Neck: Denies pain.  Denies swelling.  Denies pain with movement. Cardiovascular: Denies chest pain, palpitations. Has chronic edema. Denies orthopnea Respiratory: Denies shortness of breath, cough. Denies wheezing. Denies sputum production Gastrointestinal: Reports bright red blood per rectum. denies abdominal pain, swelling.  Denies nausea, vomiting, diarrhea.  Denies hematemesis. Musculoskeletal: Denies limitation of movement.  Denies deformity or swelling.  Denies pain.  Denies arthralgias or  myalgias. Genitourinary: Denies pelvic pain.  Denies urinary frequency or hesitancy.  Denies dysuria.  Skin: Denies rash.  Denies petechiae, purpura, ecchymosis. Neurological: Denies syncope. Denies seizure activity. Denies paresthesia. Denies slurred speech, drooping face.  Denies visual change. Psychiatric: Denies depression, anxiety. Denies hallucinations.  Past Medical History:  Diagnosis Date  . Arthritis    one finger  . BPH (benign prostatic hypertrophy)    f/u @ Biopsy, s/p bx 2009 apro (-)  . Cataract   . Colon polyp   . Complication of anesthesia    violent upon waking up (only 1 time)   . Diverticulosis   . Dysrhythmia    A-flutter/Afib  . Early cataract    left  . Edema leg    asymetric , L > R , [place] neg for DVT 09-2010  . Gastric polyps   . GERD (gastroesophageal reflux disease)   . Hemorrhoids   . History of kidney stones   . Hypercalcemia    parathyroidectomy 04/2008  . Hyperlipidemia   . Hypertension   . Iron deficiency anemia   . Polyp, stomach   . Pre-diabetes   . Wears dentures   . Wears glasses     Past Surgical History:  Procedure Laterality Date  . A-FLUTTER ABLATION N/A 08/12/2017   Procedure: A-FLUTTER ABLATION;  Surgeon: Deboraha Sprang, MD;  Location: [place] [facility];  Service: Cardiovascular;  Laterality: N/A;  . APPENDECTOMY    . BIOPSY  09/20/2018   Procedure: BIOPSY;  Surgeon: Rush Landmark Telford Nab., MD;  Location: [place];  Service: Gastroenterology;;  . CATARACT EXTRACTION  Right   . COLONOSCOPY  12/16/2011   Procedure: COLONOSCOPY;  Surgeon: Inda Castle, MD;  Location: [facility];  Service: Endoscopy;  Laterality: N/A;  . ENDOSCOPIC MUCOSAL RESECTION N/A 09/20/2018   Procedure: ENDOSCOPIC MUCOSAL RESECTION;  Surgeon: Rush Landmark Telford Nab., MD;  Location: [place];  Service: Gastroenterology;  Laterality: N/A;  . ENDOSCOPIC MUCOSAL RESECTION  01/31/2019   Procedure: ENDOSCOPIC MUCOSAL RESECTION;  Surgeon: Rush Landmark  Telford Nab., MD;  Location: [facility] END[facility]COPY;  Service: Gastroenterology;;  . ESOPHAGOGASTRODUODENOSCOPY N/A 01/17/2013   Procedure: ESOPHAGOGASTRODUODENOSCOPY (EGD);  Surgeon: Inda Castle, MD;  Location: [facility];  Service: Endoscopy;  Laterality: N/A;  . ESOPHAGOGASTRODUODENOSCOPY (EGD) WITH PROPOFOL N/A 09/20/2018   Procedure: ESOPHAGOGASTRODUODENOSCOPY (EGD) WITH PROPOFOL;  Surgeon: Rush Landmark Telford Nab., MD;  Location: [place];  Service: Gastroenterology;  Laterality: N/A;  . ESOPHAGOGASTRODUODENOSCOPY (EGD) WITH PROPOFOL N/A 01/31/2019   Procedure: ESOPHAGOGASTRODUODENOSCOPY (EGD) WITH PROPOFOL;  Surgeon: Rush Landmark Telford Nab., MD;  Location: [place];  Service: Gastroenterology;  Laterality: N/A;  . ESOPHAGOGASTRODUODENOSCOPY (EGD) WITH PROPOFOL N/A 04/04/2020   Procedure: ESOPHAGOGASTRODUODENOSCOPY (EGD) WITH PROPOFOL;  Surgeon: Rush Landmark Telford Nab., MD;  Location: [facility];  Service: Gastroenterology;  Laterality: N/A;  . EUS N/A 01/31/2019   Procedure: UPPER ENDOSCOPIC ULTRASOUND (EUS) RADIAL;  Surgeon: Rush Landmark Telford Nab., MD;  Location: [place];  Service: Gastroenterology;  Laterality: N/A;  . EUS N/A 04/04/2020   Procedure: UPPER ENDOSCOPIC ULTRASOUND (EUS) RADIAL;  Surgeon: Rush Landmark Telford Nab., MD;  Location: [facility];  Service: Gastroenterology;  Laterality: N/A;  . EXTERNAL EAR SURGERY  01/2016   BCC removal  . EYE SURGERY  infant   "lazy eye"  . FINE NEEDLE ASPIRATION  01/31/2019   Procedure: FINE NEEDLE ASPIRATION (FNA) LINEAR;  Surgeon: Irving Copas., MD;  Location: [facility] ENDOSCOPY;  Service: Gastroenterology;;  . Otho Darner SIGMOIDOSCOPY  02/09/2012   Procedure: FLEXIBLE SIGMOIDOSCOPY;  Surgeon: Inda Castle, MD;  Location: [facility];  Service: Endoscopy;  Laterality: N/A;  . HEMORRHOID BANDING  02/09/2012   Procedure: HEMORRHOID BANDING;  Surgeon: Inda Castle, MD;  Location: [facility];  Service: Endoscopy;  Laterality:  N/A;  . HEMOSTASIS CLIP PLACEMENT  09/20/2018   Procedure: HEMOSTASIS CLIP PLACEMENT;  Surgeon: Irving Copas., MD;  Location: [place];  Service: Gastroenterology;;  . HEMOSTASIS CLIP PLACEMENT  01/31/2019   Procedure: HEMOSTASIS CLIP PLACEMENT;  Surgeon: Irving Copas., MD;  Location: [place];  Service: Gastroenterology;;  . HEMOSTASIS CLIP PLACEMENT  04/04/2020   Procedure: HEMOSTASIS CLIP PLACEMENT;  Surgeon: Irving Copas., MD;  Location: [facility];  Service: Gastroenterology;;  . HEMOSTASIS CONTROL  04/04/2020   Procedure: HEMOSTASIS CONTROL;  Surgeon: Irving Copas., MD;  Location: [facility];  Service: Gastroenterology;;  snare cautery  . HOT HEMOSTASIS N/A 01/17/2013   Procedure: HOT HEMOSTASIS (ARGON PLASMA COAGULATION/BICAP);  Surgeon: Inda Castle, MD;  Location: [facility];  Service: Endoscopy;  Laterality: N/A;  . KIDNEY STONE SURGERY    . MULTIPLE TOOTH EXTRACTIONS    . PARATHYROIDECTOMY  04/2008  . POLYPECTOMY  09/20/2018   Procedure: POLYPECTOMY;  Surgeon: Mansouraty, Telford Nab., MD;  Location: [place];  Service: Gastroenterology;;  . POLYPECTOMY  01/31/2019   Procedure: POLYPECTOMY;  Surgeon: Irving Copas., MD;  Location: [place];  Service: Gastroenterology;;  . POLYPECTOMY  04/04/2020   Procedure: POLYPECTOMY;  Surgeon: Irving Copas., MD;  Location: [facility];  Service: Gastroenterology;;  . Clide Deutscher  09/20/2018   Procedure: Clide Deutscher;  Surgeon: Irving Copas., MD;  Location: [facility];  Service: Gastroenterology;;  . Clide Deutscher  01/31/2019   Procedure: Clide Deutscher;  Surgeon: Irving Copas., MD;  Location: [place];  Service: Gastroenterology;;  . Lia Foyer INJECTION  04/04/2020   Procedure: SUBMUCOSAL INJECTION;  Surgeon: Irving Copas., MD;  Location: [facility];  Service: Gastroenterology;;  EPI Injection  . TONSILLECTOMY      Social  History  reports that he quit smoking about 38 years ago. His smoking use included cigarettes. He has a 12.50 pack-year smoking history. He has never used smokeless tobacco. He reports current alcohol use of about 7.0 standard drinks of alcohol per week. He reports that he does not use drugs.  Allergies  Allergen Reactions  . Cucumber Extract Nausea And Vomiting  . Sulfonamide Derivatives Other (See Comments)    Unknown reaction    Family History  Problem Relation Age of Onset  . Heart attack Father 26  . Heart attack Mother   . Pneumonia Sister 6  . Colon cancer Neg Hx   . Prostate cancer Neg Hx   . Diabetes Neg Hx      Prior to Admission medications   Medication Sig Start Date End Date Taking? Authorizing Provider  apixaban (ELIQUIS) 5 MG TABS tablet Take 1 tablet (5 mg total) by mouth 2 (two) times daily. 04/07/20   Mansouraty, Telford Nab., MD  cholecalciferol (VITAMIN D) 1000 UNITS tablet Take 1,000 Units by mouth every evening.     [provider]  ferrous sulfate 325 (65 FE) MG tablet Take 325 mg by mouth daily at 12 noon.    [provider]  flecainide (TAMBOCOR) 100 MG tablet Take 1 tablet (100 mg total) by mouth every evening. 05/14/20   Deboraha Sprang, MD  flecainide (TAMBOCOR) 50 MG tablet TAKE 1 TABLET BY MOUTH ONCE DAILY IN THE MORNING 05/29/20   Deboraha Sprang, MD  metoprolol succinate (TOPROL-XL) 50 MG 24 hr tablet Take 1 tablet by mouth once daily 05/29/20   Deboraha Sprang, MD  Multiple Vitamin (MULTIVITAMIN WITH MINERALS) TABS tablet Take 1 tablet by mouth daily at 12 noon.    [provider]  Multiple Vitamins-Minerals (PRESERVISION AREDS 2 PO) Take 1 tablet by mouth every evening.    [provider]  NIFEdipine (PROCARDIA XL/NIFEDICAL XL) 60 MG 24 hr tablet Take 1 tablet (60 mg total) by mouth daily. 05/30/20   Colon Branch, MD  omeprazole (PRILOSEC) 40 MG capsule Take 1 capsule (40 mg total) by mouth 2 (two) times daily before a  meal. 05/28/20   Paz, Alda Berthold, MD  Saw Palmetto 450 MG CAPS Take 450 mg by mouth every evening.    [provider]  simvastatin (ZOCOR) 40 MG tablet Take 1 tablet (40 mg total) by mouth at bedtime. 05/28/20   Colon Branch, MD    Physical Exam: Vitals:   07/17/20 2100 07/17/20 2130 07/17/20 2200 07/17/20 2230  BP: 119/80 (!) 145/85 (!) 149/84 131/80  Pulse: 62 60 (!) 57 62  Resp: 19 12 19 18   Temp:      TempSrc:      SpO2: 96% 97% 97% 96%    Constitutional: NAD, calm, comfortable Vitals:   07/17/20 2100 07/17/20 2130 07/17/20 2200 07/17/20 2230  BP: 119/80 (!) 145/85 (!) 149/84 131/80  Pulse: 62 60 (!) 57 62  Resp: 19 12 19 18   Temp:      TempSrc:      SpO2: 96% 97% 97% 96%  General: WDWN, Alert and oriented x3.  Eyes: EOMI, PERRL, conjunctivae normal.  Sclera nonicteric HENT:  [place]/AT, external ears normal. Nares patent without epistasis. Mucous membranes are moist.   Neck: Soft, normal range of motion, supple, no masses, no thyromegaly.  Trachea midline Respiratory: clear to auscultation bilaterally, no wheezing, no crackles. Normal respiratory effort. No accessory muscle use.  Cardiovascular: Regular rate and rhythm, no murmurs / rubs / gallops.  Chronic lower extremity edema. Abdomen: Soft, no tenderness, nondistended, no rebound or guarding.  No masses palpated.  Bowel sounds normoactive. Small external without bleeding.  Stool Hemoccult positive Musculoskeletal: FROM. no cyanosis. No joint deformity upper and lower extremities. Normal muscle tone.  Skin: Warm, dry, intact no rashes, lesions, ulcers. No induration Neurologic: CN 2-12 grossly intact. Normal speech. Sensation intact, Strength 5/5 in all extremities.   Psychiatric: Normal judgment and insight.  Normal mood.    Labs on Admission: I have personally reviewed following labs and imaging studies  CBC: Recent Labs  Lab 07/17/20 2033  WBC 13.1*  NEUTROABS 10.6*  HGB 15.3  HCT 46.3  MCV 92.6  PLT 214     Basic Metabolic Panel: Recent Labs  Lab 07/17/20 2033  NA 138  K 4.9  CL 104  CO2 26  GLUCOSE 128*  BUN 28*  CREATININE 1.56*  CALCIUM 8.6*    GFR: CrCl cannot be calculated (Unknown ideal weight.).  Liver Function Tests: Recent Labs  Lab 07/17/20 2033  AST 29  ALT 18  ALKPHOS 59  BILITOT 1.4*  PROT 6.6  ALBUMIN 3.4*    Urine analysis:    Component Value Date/Time   COLORURINE YELLOW 02/10/2020 1953   APPEARANCEUR CLEAR 02/10/2020 1953   LABSPEC 1.009 02/10/2020 1953   PHURINE 6.0 02/10/2020 1953   GLUCOSEU NEGATIVE 02/10/2020 1953   GLUCOSEU NEGATIVE 05/13/2017 1038   HGBUR MODERATE (A) 02/10/2020 1953   BILIRUBINUR NEGATIVE 02/10/2020 1953   KETONESUR NEGATIVE 02/10/2020 1953   PROTEINUR NEGATIVE 02/10/2020 1953   UROBILINOGEN 0.2 05/13/2017 1038   NITRITE NEGATIVE 02/10/2020 1953   LEUKOCYTESUR SMALL (A) 02/10/2020 1953    Radiological Exams on Admission: No results found.  EKG: Independently reviewed.  EKG shows normal sinus rhythm with left anterior fascicular block.  No acute ST elevation or depression.  QTc 448  Assessment/Plan Active Problems:   Acute lower GI bleeding Mr. Shadwick admitted to progressive care unit with lower GI bleed.  Has had 3 episodes of bright red blood today.  Reported that it was a large volume of blood with some clots.  Denies any abdominal or rectal pain. GI has been consulted and will evaluate patient in the morning. Initial hemoglobin is greater than 15.  We will check serial H&H's.  Will transfuse if H&H drops dramatically. Patient is hemodynamically stable.    AKI (acute kidney injury)  Elevated creatinine to 1.56 from a baseline of 1.2. Gentle IV fluid hydration provided. Recheck electrolytes and renal function the morning    Essential hypertension To new home medication of metoprolol.  Monitor blood pressure    A-fib  History of atrial fibrillation that is stable on flecainide.  Continue flecainide.   Patient has been on Eliquis which will be held with GI bleeding    Calculus of kidney Chronic.  Asymptomatic    Chronic anticoagulation Converted to atrial fibrillation.  Eliquis held    DVT prophylaxis: SCD's for the prophylaxis.  Eliquis will be held with lower GI bleeding Code Status:   Full code  Family Communication:  Plan discussed with patient.  Patient verbalized understanding agrees with plan.  Questions answered.  Further recommendations to follow as clinical indicated Disposition Plan:   Patient is from:  Home  Anticipated DC to:  Home  Anticipated DC date:  Anticipate 2 midnight or more stay in the hospital to treat acute condition  Anticipated DC barriers: No barriers to discharge identified at this time  Consults called:  Gastroenterology, [place] Admission status:  Inpatient  Yevonne Aline Preet Mangano MD [facility]  How to contact the [facility] Attending or Consulting provider [place] or covering provider during after hours [place], for this patient?   1. Check the care team in [facility] and look for a) attending/consulting [facility] provider listed and b) the [facility] team listed 2. Log into [email or web address] and use [place] universal password to access. If you do not have the password, please contact the hospital operator. 3. Locate the [facility] provider you are looking for under [facility] and page to a number that you can be directly reached. 4. If you still have difficulty reaching the provider, please page the [facility] (Director on Call) for the Hospitalists listed on amion for assistance.  07/17/2020, 10:53 PM

## 2020-07-18 ENCOUNTER — Ambulatory Visit: Payer: PPO | Admitting: Family

## 2020-07-18 DIAGNOSIS — I10 Essential (primary) hypertension: Secondary | ICD-10-CM

## 2020-07-18 DIAGNOSIS — I4819 Other persistent atrial fibrillation: Secondary | ICD-10-CM

## 2020-07-18 DIAGNOSIS — Z7901 Long term (current) use of anticoagulants: Secondary | ICD-10-CM

## 2020-07-18 DIAGNOSIS — N200 Calculus of kidney: Secondary | ICD-10-CM

## 2020-07-18 DIAGNOSIS — K922 Gastrointestinal hemorrhage, unspecified: Secondary | ICD-10-CM

## 2020-07-18 LAB — HEMOGLOBIN AND HEMATOCRIT, BLOOD
HCT: 37.4 % — ABNORMAL LOW (ref 39.0–52.0)
HCT: 32.8 % — ABNORMAL LOW (ref 39.0–52.0)
HCT: 34.1 % — ABNORMAL LOW (ref 39.0–52.0)
Hemoglobin: 12.5 g/dL — ABNORMAL LOW (ref 13.0–17.0)
Hemoglobin: 11.1 g/dL — ABNORMAL LOW (ref 13.0–17.0)
Hemoglobin: 11.3 g/dL — ABNORMAL LOW (ref 13.0–17.0)

## 2020-07-18 LAB — BASIC METABOLIC PANEL
Anion gap: 7 (ref 5–15)
BUN: 29 mg/dL — ABNORMAL HIGH (ref 8–23)
CO2: 27 mmol/L (ref 22–32)
Calcium: 8.3 mg/dL — ABNORMAL LOW (ref 8.9–10.3)
Chloride: 104 mmol/L (ref 98–111)
Creatinine, Ser: 1.5 mg/dL — ABNORMAL HIGH (ref 0.61–1.24)
GFR, Estimated: 46 mL/min — ABNORMAL LOW (ref >60)
Glucose, Bld: 150 mg/dL — ABNORMAL HIGH (ref 70–99)
Potassium: 4 mmol/L (ref 3.5–5.1)
Sodium: 138 mmol/L (ref 135–145)

## 2020-07-18 LAB — CBC
HCT: 39.6 % (ref 39.0–52.0)
Hemoglobin: 13.2 g/dL (ref 13.0–17.0)
MCH: 31 pg (ref 26.0–34.0)
MCHC: 33.3 g/dL (ref 30.0–36.0)
MCV: 93 fL (ref 80.0–100.0)
Platelets: 189 10*3/uL (ref 150–400)
RBC: 4.26 MIL/uL (ref 4.22–5.81)
RDW: 13.4 % (ref 11.5–15.5)
WBC: 9.4 10*3/uL (ref 4.0–10.5)
nRBC: 0 % (ref 0.0–0.2)

## 2020-07-18 LAB — ABO/RH: ABO/RH(D): O POS

## 2020-07-18 MED ORDER — LACTATED RINGERS IV SOLN
INTRAVENOUS | Status: AC
  Administered 2020-07-19: 1000 mL via INTRAVENOUS

## 2020-07-18 MED ORDER — HYDRALAZINE HCL 20 MG/ML IJ SOLN: 5.0000 mg | Freq: Four times a day (QID) | INTRAMUSCULAR | Status: DC | PRN

## 2020-07-18 MED ORDER — PANTOPRAZOLE SODIUM 40 MG PO TBEC
40.0000 mg | DELAYED_RELEASE_TABLET | Freq: Two times a day (BID) | ORAL | Status: DC
  Administered 2020-07-18 – 2020-07-20 (×4): 40 mg via ORAL
  Filled 2020-07-18 (×4): qty 1

## 2020-07-18 MED ORDER — PEG-KCL-NACL-NASULF-NA ASC-C 100 G PO SOLR
0.5000 | Freq: Once | ORAL | Status: AC
  Administered 2020-07-18: 100 g via ORAL
  Filled 2020-07-18: qty 1

## 2020-07-18 MED ORDER — PEG-KCL-NACL-NASULF-NA ASC-C 100 G PO SOLR
0.5000 | Freq: Once | ORAL | Status: AC
  Administered 2020-07-19: 100 g via ORAL
  Filled 2020-07-18: qty 1

## 2020-07-18 MED ORDER — PEG-KCL-NACL-NASULF-NA ASC-C 100 G PO SOLR: 1.0000 | Freq: Once | ORAL | Status: DC

## 2020-07-18 MED ORDER — METOPROLOL SUCCINATE ER 50 MG PO TB24
50.0000 mg | ORAL_TABLET | Freq: Every day | ORAL | Status: DC
  Administered 2020-07-19: 50 mg via ORAL
  Filled 2020-07-18: qty 1

## 2020-07-18 NOTE — Consult Note (Addendum)
Referring Provider:  [facility]         Primary Care Physician:  Colon Branch, MD Primary Gastroenterologist:  Thornton Park, MD            We were asked to see this patient for:  Rectal bleeding            Attending physician's note   I have taken a history, examined the patient and reviewed the chart. I agree with the Advanced Practitioner's note, impression and recommendations.  86 year old M admitted with painless hematochezia.   Hemoglobin 15--->11 He is hemodynamically stable Possible etiology acute diverticular hemorrhage.  Last colonoscopy almost 10 years ago Will plan to proceed with colonoscopy to further evaluate source of lower GI bleed and exclude neoplastic lesion Hold Eliquis Clear liquid Bowel prep  He has history of GIST and multiple gastric polyps, s/p EGD/ EUS 03/2020 and is scheduled for surveillance EUS in 2023  The risks and benefits as well as alternatives of endoscopic procedure(s) have been discussed and reviewed. All questions answered. The patient agrees to proceed.   Damaris Hippo , MD [phone]    ASSESSMENT / PLAN:   # 86 yo male with painless hematochezia on Eliquis. He could be having a diverticular hemorrhage. Last colonoscopy was in 2013, see below. Patient has declined colonoscopy in recent past as part of IDA workup ( though IDA probably from the multiple gastric polyps found on EGD).  --Patient will be scheduled for a colonoscopy. The risks and benefits of colonoscopy with possible polypectomy / biopsies were discussed and the patient agrees to proceed.  --Monitor hgb. It is 12.3 today, down from 15.3 yesterday  # Hx of multiple fundic gland polyps with bleeding.in 2020.   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 Wasn't able to be seen on CT scan Feb 2022. Plan is for repeat EUS in around Jan 2023. He is followed by Dr. Burr Medico.   # Pancreatic cyst being followed by Dr. Rush Landmark. Plan is for 1 year  surveillance imaging around January 2023   # Atrial tachycardia / flutter on Eliquis. Last dose was yesterday.      HPI:                                                                                                                             Chief Complaint: Rectal bleeding  BURYL SCHNEIDERS is a 86 y.o. male with a past medical history significant for IDA, GIST, H.pylori infection (treated), adenomatous colon polyps, internal hemorrhoids s/p banding in 2014), Atrial tachycardia / flutter on Eliquis, HTN, hyperlipidemia, DM, kidney stones.  Patient presented to the ED by EMS last evening with complaints of rectal bleeding.  At presentation he was hemodynamically stable.  Hemoglobin at baseline at 15.3.  Today hemoglobin has declined to 12.5.   Patient says the bleeding started yesterday, he had no associated rectal or abdominal pain. Blood was bright  red with clots. He had about three episodes at home prior to arriving to the hospital. He just had another episode but unfortunately it was flushed before Nursing could see it. Patient gives a history of chronic constipation and hemorrhoids but hasn't bled from hemorrhoids in many years. His stool is dark on oral iron. His last dose of Eliquis was yesterday. He has no other GI or general medical complaints   PREVIOUS ENDOSCOPIC EVALUATIONS / PERTINENT STUDIES   January 2022 EUS for evaluation of gastric lesion Gastric mass seen on EUS findings included more than 35 mm pedunculated and sessile polyps with a few showing active bleeding and others with stigmata of recent bleeding.  3 lesions were removed.  Diffuse prominent gastric folds found in the entire stomach.  A medium sized mucosal mass with no bleeding was found in the posterior wall of the gastric antrum consistent with GIST.  Endosonographic findings included an subepithelial lesion in the antrum of the stomach.  A tissue diagnosis was obtained prior to this exam and showed a stromal cell  (smooth muscle) neoplasm.  A cystic lesion was seen in the pancreatic body, tissue not obtained but appear to be benign.  Many stones seen in the gallbladder.  Nonmalignant appearing lymph nodes visualized in the celiac region, peripancreatic region and porta hepatis region FINAL MICROSCOPIC DIAGNOSIS:   A. DUODENUM, BIOPSY:  - Duodenal mucosa with no significant pathologic findings.  - Negative for increased intraepithelial lymphocytes and villous  architectural changes.   B. STOMACH, ENDOSCOPIC MUCOSAL RESECTION OF POLYPS:  - Hyperplastic polyps, ulcerated and inflamed with reactive changes.  - Negative for dysplasia and malignancy.    July 2020 EGD for evaluation of iron deficiency anemia No gross lesions in esophagus. Z-line irregular, 40 cm from the incisors. - Congested, erythematous, friable (with spontaneous bleeding), granular and inflamed mucosa in the cardia extending into multiple hyperplastic appearing polyps was noted (surrounds the near entirety of the cardia). - Multiple gastric polyps. 8 were resected and retrieved. Clips (MR conditional) were placed as noted above to decrease risk of bleeding post-intervention. - Biopsies were taken with a cold forceps for histology and Helicobacter pylori testing. - A single submucosal papule (nodule) found in the stomach. - Small duodenal nodule in bulb but no other gross lesions in the duodenal bulb, in the first portion of the duodenum and in the second portion of the duodenum.  Stomach, biopsy - HYPERPLASTIC GASTRIC POLYP - NO H. PYLORI, INTESTINAL METAPLASIA OR MALIGNANCY IDENTIFIED 2. Stomach, biopsy - HYPERPLASTIC GASTRIC POLYP(S) - FUNDIC GLAND POLYP - NO H. PYLORI, INTESTINAL METAPLASIA OR MALIGNANCY IDENTIFIED  01/31/2019 EGD and EUS EGD Impression: - No gross lesions in esophagus. - Multiple gastric polyps. 3 were resected and retrieved (2 via Mucosal resection & 1 via Hot snare polypectomy). Clips (MR conditional)  were placed. - A single submucosal papule (nodule) found in the stomach. - Endoclips were found in the stomach (previously placed). - A single duodenal polyp. Resected and retrieved. Clip (MR conditional) was placed. EUS Impression: - An intramural (subepithelial) lesion was found in the antrum of the stomach. The lesion appeared to originate from within the muscularis propria (Layer 4). Tissue was obtained from this exam, and results are pending. However, the endosonographic appearance is suspicious for a stromal cell (smooth muscle) neoplasm, of indeterminate biological behavior. Fine needle biopsy performed. Wall thickening was seen in the body of the stomach. The thickening appeared to be primarily within the luminal interface/superficial mucosa (Layer 1) and  deep mucosa (Layer 2). - Multiple stones were visualized endosonographically in the gallbladder. FINAL MICROSCOPIC DIAGNOSIS:   A. POSTERIOR WALL OF ANTRUM NODULE, FINE NEEDLE ASPIRATION:  - Consistent with gastrointestinal stromal tumor.   2013 colonoscopy --complete exam, excellent prep --Several small polyps < 10 mm --diverticulosis  Path - multiple fragments of hyperplastic polyps  Past Medical History:  Diagnosis Date  . Arthritis    one finger  . BPH (benign prostatic hypertrophy)    f/u @ Biopsy, s/p bx 2009 apro (-)  . Cataract   . Colon polyp   . Complication of anesthesia    violent upon waking up (only 1 time)   . Diverticulosis   . Dysrhythmia    A-flutter/Afib  . Early cataract    left  . Edema leg    asymetric , L > R , [place] neg for DVT 09-2010  . Gastric polyps   . GERD (gastroesophageal reflux disease)   . Hemorrhoids   . History of kidney stones   . Hypercalcemia    parathyroidectomy 04/2008  . Hyperlipidemia   . Hypertension   . Iron deficiency anemia   . Polyp, stomach   . Pre-diabetes   . Wears dentures   . Wears glasses     Past Surgical History:  Procedure Laterality Date  . A-FLUTTER  ABLATION N/A 08/12/2017   Procedure: A-FLUTTER ABLATION;  Surgeon: Deboraha Sprang, MD;  Location: [place] [facility];  Service: Cardiovascular;  Laterality: N/A;  . APPENDECTOMY    . BIOPSY  09/20/2018   Procedure: BIOPSY;  Surgeon: Rush Landmark Telford Nab., MD;  Location: [place];  Service: Gastroenterology;;  . CATARACT EXTRACTION Right   . COLONOSCOPY  12/16/2011   Procedure: COLONOSCOPY;  Surgeon: Inda Castle, MD;  Location: [facility];  Service: Endoscopy;  Laterality: N/A;  . ENDOSCOPIC MUCOSAL RESECTION N/A 09/20/2018   Procedure: ENDOSCOPIC MUCOSAL RESECTION;  Surgeon: Rush Landmark Telford Nab., MD;  Location: [place];  Service: Gastroenterology;  Laterality: N/A;  . ENDOSCOPIC MUCOSAL RESECTION  01/31/2019   Procedure: ENDOSCOPIC MUCOSAL RESECTION;  Surgeon: Rush Landmark Telford Nab., MD;  Location: [facility];  Service: Gastroenterology;;  . ESOPHAGOGASTRODUODENOSCOPY N/A 01/17/2013   Procedure: ESOPHAGOGASTRODUODENOSCOPY (EGD);  Surgeon: Inda Castle, MD;  Location: [facility];  Service: Endoscopy;  Laterality: N/A;  . ESOPHAGOGASTRODUODENOSCOPY (EGD) WITH PROPOFOL N/A 09/20/2018   Procedure: ESOPHAGOGASTRODUODENOSCOPY (EGD) WITH PROPOFOL;  Surgeon: Rush Landmark Telford Nab., MD;  Location: [place];  Service: Gastroenterology;  Laterality: N/A;  . ESOPHAGOGASTRODUODENOSCOPY (EGD) WITH PROPOFOL N/A 01/31/2019   Procedure: ESOPHAGOGASTRODUODENOSCOPY (EGD) WITH PROPOFOL;  Surgeon: Rush Landmark Telford Nab., MD;  Location: [place];  Service: Gastroenterology;  Laterality: N/A;  . ESOPHAGOGASTRODUODENOSCOPY (EGD) WITH PROPOFOL N/A 04/04/2020   Procedure: ESOPHAGOGASTRODUODENOSCOPY (EGD) WITH PROPOFOL;  Surgeon: Rush Landmark Telford Nab., MD;  Location: [facility];  Service: Gastroenterology;  Laterality: N/A;  . EUS N/A 01/31/2019   Procedure: UPPER ENDOSCOPIC ULTRASOUND (EUS) RADIAL;  Surgeon: Rush Landmark Telford Nab., MD;  Location: [place];  Service:  Gastroenterology;  Laterality: N/A;  . EUS N/A 04/04/2020   Procedure: UPPER ENDOSCOPIC ULTRASOUND (EUS) RADIAL;  Surgeon: Rush Landmark Telford Nab., MD;  Location: [facility];  Service: Gastroenterology;  Laterality: N/A;  . EXTERNAL EAR SURGERY  01/2016   BCC removal  . EYE SURGERY  infant   "lazy eye"  . FINE NEEDLE ASPIRATION  01/31/2019   Procedure: FINE NEEDLE ASPIRATION (FNA) LINEAR;  Surgeon: Irving Copas., MD;  Location: [place];  Service: Gastroenterology;;  . Otho Darner SIGMOIDOSCOPY  02/09/2012   Procedure: FLEXIBLE SIGMOIDOSCOPY;  Surgeon: Inda Castle, MD;  Location: [facility];  Service: Endoscopy;  Laterality: N/A;  . HEMORRHOID BANDING  02/09/2012   Procedure: HEMORRHOID BANDING;  Surgeon: Inda Castle, MD;  Location: [facility];  Service: Endoscopy;  Laterality: N/A;  . HEMOSTASIS CLIP PLACEMENT  09/20/2018   Procedure: HEMOSTASIS CLIP PLACEMENT;  Surgeon: Irving Copas., MD;  Location: [place];  Service: Gastroenterology;;  . HEMOSTASIS CLIP PLACEMENT  01/31/2019   Procedure: HEMOSTASIS CLIP PLACEMENT;  Surgeon: Irving Copas., MD;  Location: [place];  Service: Gastroenterology;;  . HEMOSTASIS CLIP PLACEMENT  04/04/2020   Procedure: HEMOSTASIS CLIP PLACEMENT;  Surgeon: Irving Copas., MD;  Location: [facility];  Service: Gastroenterology;;  . HEMOSTASIS CONTROL  04/04/2020   Procedure: HEMOSTASIS CONTROL;  Surgeon: Irving Copas., MD;  Location: [facility];  Service: Gastroenterology;;  snare cautery  . HOT HEMOSTASIS N/A 01/17/2013   Procedure: HOT HEMOSTASIS (ARGON PLASMA COAGULATION/BICAP);  Surgeon: Inda Castle, MD;  Location: [facility];  Service: Endoscopy;  Laterality: N/A;  . KIDNEY STONE SURGERY    . MULTIPLE TOOTH EXTRACTIONS    . PARATHYROIDECTOMY  04/2008  . POLYPECTOMY  09/20/2018   Procedure: POLYPECTOMY;  Surgeon: Mansouraty, Telford Nab., MD;  Location: [place];  Service:  Gastroenterology;;  . POLYPECTOMY  01/31/2019   Procedure: POLYPECTOMY;  Surgeon: Irving Copas., MD;  Location: [place];  Service: Gastroenterology;;  . POLYPECTOMY  04/04/2020   Procedure: POLYPECTOMY;  Surgeon: Irving Copas., MD;  Location: [facility];  Service: Gastroenterology;;  . Clide Deutscher  09/20/2018   Procedure: Clide Deutscher;  Surgeon: Irving Copas., MD;  Location: [place];  Service: Gastroenterology;;  . Clide Deutscher  01/31/2019   Procedure: Clide Deutscher;  Surgeon: Irving Copas., MD;  Location: [place];  Service: Gastroenterology;;  . Lia Foyer INJECTION  04/04/2020   Procedure: SUBMUCOSAL INJECTION;  Surgeon: Irving Copas., MD;  Location: [facility];  Service: Gastroenterology;;  EPI Injection  . TONSILLECTOMY      Prior to Admission medications   Medication Sig Start Date End Date Taking? Authorizing Provider  apixaban (ELIQUIS) 5 MG TABS tablet Take 1 tablet (5 mg total) by mouth 2 (two) times daily. 04/07/20  Yes Mansouraty, Telford Nab., MD  cholecalciferol (VITAMIN D) 1000 UNITS tablet Take 1,000 Units by mouth every evening.    Yes [provider]  ferrous sulfate 325 (65 FE) MG tablet Take 325 mg by mouth daily at 12 noon.   Yes [provider]  flecainide (TAMBOCOR) 100 MG tablet Take 1 tablet (100 mg total) by mouth every evening. 05/14/20  Yes Deboraha Sprang, MD  flecainide (TAMBOCOR) 50 MG tablet TAKE 1 TABLET BY MOUTH ONCE DAILY IN THE MORNING 05/29/20  Yes Deboraha Sprang, MD  metoprolol succinate (TOPROL-XL) 50 MG 24 hr tablet Take 1 tablet by mouth once daily 05/29/20  Yes Deboraha Sprang, MD  Multiple Vitamin (MULTIVITAMIN WITH MINERALS) TABS tablet Take 1 tablet by mouth daily at 12 noon.   Yes [provider]  Multiple Vitamins-Minerals (PRESERVISION AREDS 2 PO) Take 1 tablet by mouth every evening.   Yes [provider]  NIFEdipine (PROCARDIA XL/NIFEDICAL XL)  60 MG 24 hr tablet Take 1 tablet (60 mg total) by mouth daily. 05/30/20  Yes Colon Branch, MD  omeprazole (PRILOSEC) 40 MG capsule Take 1 capsule (40 mg total) by mouth 2 (two) times daily before a meal. 05/28/20  Yes Paz, [place],  MD  Saw Palmetto 450 MG CAPS Take 450 mg by mouth every evening.   Yes [provider]  simvastatin (ZOCOR) 40 MG tablet Take 1 tablet (40 mg total) by mouth at bedtime. 05/28/20  Yes Colon Branch, MD    Current Facility-Administered Medications  Medication Dose Route Frequency Provider Last Rate Last Admin  . acetaminophen (TYLENOL) tablet 650 mg  650 mg Oral Q6H PRN Chotiner, Yevonne Aline, MD       Or  . acetaminophen (TYLENOL) suppository 650 mg  650 mg Rectal Q6H PRN Chotiner, Yevonne Aline, MD      . flecainide (TAMBOCOR) tablet 100 mg  100 mg Oral QPM Chotiner, Yevonne Aline, MD   100 mg at 07/18/20 0013  . flecainide (TAMBOCOR) tablet 50 mg  50 mg Oral q morning Chotiner, Yevonne Aline, MD      . lactated ringers infusion   Intravenous Continuous Chotiner, Yevonne Aline, MD 75 mL/hr at 07/17/20 2322 New Bag at 07/17/20 2322  . metoprolol succinate (TOPROL-XL) 24 hr tablet 50 mg  50 mg Oral QHS Chotiner, Yevonne Aline, MD   50 mg at 07/18/20 0013  . ondansetron (ZOFRAN) tablet 4 mg  4 mg Oral Q6H PRN Chotiner, Yevonne Aline, MD       Or  . ondansetron (ZOFRAN) injection 4 mg  4 mg Intravenous Q6H PRN Chotiner, Yevonne Aline, MD      . pantoprazole (PROTONIX) EC tablet 80 mg  80 mg Oral Daily Chotiner, Yevonne Aline, MD      . simvastatin (ZOCOR) tablet 40 mg  40 mg Oral QHS Chotiner, Yevonne Aline, MD   40 mg at 07/18/20 0013    Allergies as of 07/17/2020 - Review Complete 07/17/2020  Allergen Reaction Noted  . Cucumber extract Nausea And Vomiting 10/06/2011  . Sulfonamide derivatives Other (See Comments) 02/09/2007    Family History  Problem Relation Age of Onset  . Heart attack Father 107  . Heart attack Mother   . Pneumonia Sister 6  . Colon cancer Neg Hx   . Prostate cancer Neg Hx    . Diabetes Neg Hx     Social History   Socioeconomic History  . Marital status: Widowed    Spouse name: Not on file  . Number of children: 3  . Years of education: Not on file  . Highest education level: Not on file  Occupational History  . Occupation: retired   Tobacco Use  . Smoking status: Former Smoker    Packs/day: 0.50    Years: 25.00    Pack years: 12.50    Types: Cigarettes    Quit date: 11/30/1981    Years since quitting: 38.6  . Smokeless tobacco: Never Used  Vaping Use  . Vaping Use: Never used  Substance and Sexual Activity  . Alcohol use: Yes    Alcohol/week: 7.0 standard drinks    Types: 7 Glasses of wine per week    Comment: occasional beer with dinner  . Drug use: No  . Sexual activity: Not on file  Other Topics Concern  . Not on file  Social History Narrative   Retired, single, has  a girlfriend , 3 children  (one lives in town)   Daughter: she is the HPA       Social Determinants of Radio broadcast assistant Strain: Low Risk   . Difficulty of Paying Living Expenses: Not hard at all  Food Insecurity: No Food Insecurity  . Worried About Estate manager/land agent  of Food in the Last Year: Never true  . Ran Out of Food in the Last Year: Never true  Transportation Needs: No Transportation Needs  . Lack of Transportation (Medical): No  . Lack of Transportation (Non-Medical): No  Physical Activity: Not on file  Stress: Not on file  Social Connections: Not on file  Intimate Partner Violence: Not on file    Review of Systems: All systems reviewed and negative except where noted in HPI.  OBJECTIVE:    Physical Exam: Vital signs in last 24 hours: Temp:  [97.9 F (36.6 C)-98.3 F (36.8 C)] 98.3 F (36.8 C) (05/11 0649) Pulse Rate:  [56-69] 56 (05/11 0649) Resp:  [12-21] 20 (05/11 0649) BP: (104-149)/(50-90) 104/50 (05/11 0649) SpO2:  [89 %-98 %] 89 % (05/11 0649) Last BM Date: 07/17/20 General:   Alert  male in NAD Psych:  Pleasant, cooperative.  Normal mood and affect. Eyes:  Pupils equal, sclera clear, no icterus.   Conjunctiva pink. Ears:  Normal auditory acuity. Nose:  No deformity, discharge,  or lesions. Neck:  Supple; no masses Lungs:  Clear throughout to auscultation.   No wheezes, crackles, or rhonchi.  Heart:  Regular rate and rhythm; 1+ bilateral lower extremity edema Abdomen:  Soft, non-distended, nontender, BS active, no palp mass   Rectal:  Deferred  Msk:  Symmetrical without gross deformities. . Neurologic:  Alert and  oriented x4;  grossly normal neurologically. Skin:  Intact without significant lesions or rashes.  There were no vitals filed for this visit.   Scheduled inpatient medications . flecainide  100 mg Oral QPM  . flecainide  50 mg Oral q morning  . metoprolol succinate  50 mg Oral QHS  . pantoprazole  80 mg Oral Daily  . simvastatin  40 mg Oral QHS      Intake/Output from previous day: 05/10 0701 - 05/11 0700 In: 555.7 [I.V.:455.7; IV Piggyback:100] Out: -  Intake/Output this shift: No intake/output data recorded.   Lab Results: Recent Labs    07/17/20 2033 07/18/20 0038 07/18/20 0355  WBC 13.1* 9.4  --   HGB 15.3 13.2 12.5*  HCT 46.3 39.6 37.4*  PLT 214 189  --    BMET Recent Labs    07/17/20 2033 07/18/20 0038  NA 138 138  K 4.9 4.0  CL 104 104  CO2 26 27  GLUCOSE 128* 150*  BUN 28* 29*  CREATININE 1.56* 1.50*  CALCIUM 8.6* 8.3*   LFT Recent Labs    07/17/20 2033  PROT 6.6  ALBUMIN 3.4*  AST 29  ALT 18  ALKPHOS 59  BILITOT 1.4*   PT/INR Recent Labs    07/17/20 2033  LABPROT 14.7  INR 1.2   Hepatitis Panel No results for input(s): HEPBSAG, HCVAB, HEPAIGM, HEPBIGM in the last 72 hours.   . CBC Latest Ref Rng & Units 07/18/2020 07/18/2020 07/17/2020  WBC 4.0 - 10.5 K/uL - 9.4 13.1(H)  Hemoglobin 13.0 - 17.0 g/dL 12.5(L) 13.2 15.3  Hematocrit 39.0 - 52.0 % 37.4(L) 39.6 46.3  Platelets 150 - 400 K/uL - 189 214    . CMP Latest Ref Rng & Units  07/18/2020 07/17/2020 04/17/2020  Glucose 70 - 99 mg/dL 150(H) 128(H) -  BUN 8 - 23 mg/dL 29(H) 28(H) -  Creatinine 0.61 - 1.24 mg/dL 1.50(H) 1.56(H) 1.20  Sodium 135 - 145 mmol/L 138 138 -  Potassium 3.5 - 5.1 mmol/L 4.0 4.9 -  Chloride 98 - 111 mmol/L 104 104 -  CO2 22 -  32 mmol/L 27 26 -  Calcium 8.9 - 10.3 mg/dL 8.3(L) 8.6(L) -  Total Protein 6.5 - 8.1 g/dL - 6.6 -  Total Bilirubin 0.3 - 1.2 mg/dL - 1.4(H) -  Alkaline Phos 38 - 126 U/L - 59 -  AST 15 - 41 U/L - 29 -  ALT 0 - 44 U/L - 18 -   Studies/Results: No results found.  Active Problems:   Essential hypertension   Calculus of kidney   A-fib (HCC)   Acute lower GI bleeding   AKI (acute kidney injury) ([place])   Chronic anticoagulation    Tye Savoy, NP-C @  07/18/2020, 9:01 AM

## 2020-07-18 NOTE — Progress Notes (Signed)
PROGRESS NOTE    Ronald [name]  VOZ:[id number] DOB: 10-12-1934 DOA: 07/17/2020 PCP: Colon Branch, MD   Brief Narrative:  86 year old with history of atrial fibrillation, HTN, GERD comes to the hospital with few episodes of bright red blood per rectum.  Initial hemoglobin was noted to be around 15 the following day dropped down to 12.5.  Eliquis was held, GI team was consulted with plans for colonoscopy.   Assessment & Plan:   Active Problems:   Essential hypertension   Calculus of kidney   A-fib (HCC)   Acute lower GI bleeding   AKI (acute kidney injury) ([place])   Chronic anticoagulation  Acute lower GI bleed, bright red blood per rectum History of multiple gastric polyps, GIST tumor, pancreatic cyst - Patient has not had colonoscopy in about 9 years. - Continue to hold Eliquis.  N.p.o. past midnight.  Clear liquid diet today - Baseline hemoglobin 15.3, today 12.3.  Continue to monitor this. - GI team following. - We will order PPI twice daily  Acute kidney injury - Baseline creatinine 1.1, admission creatinine 1.5.  Gentle hydration.  Monitor urine output  Essential hypertension - Metoprolol 50 mg daily.  Continue flecainide  History of paroxysmal atrial fibrillation - Patient is on flecainide.  Eliquis is on hold  Hyperlipidemia - Zocor    DVT prophylaxis: SCDs Start: 07/17/20 2312 Code Status: Full code Family Communication: None at bedside  Status is: Inpatient  Remains inpatient appropriate because:Inpatient level of care appropriate due to severity of illness   Dispo: The patient is from: Home              Anticipated d/c is to: Home              Patient currently is not medically stable to d/c.  Due to lower GI bleed, patient has colonoscopy planned tomorrow as inpatient.  Maintain hospital stay until cleared by GI   Difficult to place patient No     Subjective: Patient states he still having residual dark blood in his stool but denies any abdominal  complaints, lightheadedness dizziness.  Review of Systems Otherwise negative except as per HPI, including: General: Denies fever, chills, night sweats or unintended weight loss. Resp: Denies cough, wheezing, shortness of breath. Cardiac: Denies chest pain, palpitations, orthopnea, paroxysmal nocturnal dyspnea. GI: Denies abdominal pain, nausea, vomiting, diarrhea or constipation GU: Denies dysuria, frequency, hesitancy or incontinence MS: Denies muscle aches, joint pain or swelling Neuro: Denies headache, neurologic deficits (focal weakness, numbness, tingling), abnormal gait Psych: Denies anxiety, depression, SI/HI/AVH Skin: Denies new rashes or lesions ID: Denies sick contacts, exotic exposures, travel  Examination:  General exam: Appears calm and comfortable  Respiratory system: Clear to auscultation. Respiratory effort normal. Cardiovascular system: S1 & S2 heard, RRR. No JVD, murmurs, rubs, gallops or clicks. No pedal edema. Gastrointestinal system: Abdomen is nondistended, soft and nontender. No organomegaly or masses felt. Normal bowel sounds heard. Central nervous system: Alert and oriented. No focal neurological deficits. Extremities: Symmetric 5 x 5 power. Skin: No rashes, lesions or ulcers Psychiatry: Judgement and insight appear normal. Mood & affect appropriate.     Objective: Vitals:   07/17/20 2200 07/17/20 2230 07/17/20 2257 07/18/20 0649  BP: (!) 149/84 131/80 (!) 143/90 (!) 104/50  Pulse: (!) 57 62 69 (!) 56  Resp: 19 18 20 20   Temp:   98.2 F (36.8 C) 98.3 F (36.8 C)  TempSrc:   Oral Oral  SpO2: 97% 96% 98% (!) 89%  Intake/Output Summary (Last 24 hours) at 07/18/2020 1348 Last data filed at 07/18/2020 0535 Gross per 24 hour  Intake 555.69 ml  Output --  Net 555.69 ml   There were no vitals filed for this visit.   Data Reviewed:   CBC: Recent Labs  Lab 07/17/20 2033 07/18/20 0038 07/18/20 0355 07/18/20 1207  WBC 13.1* 9.4  --   --    NEUTROABS 10.6*  --   --   --   HGB 15.3 13.2 12.5* 11.1*  HCT 46.3 39.6 37.4* 32.8*  MCV 92.6 93.0  --   --   PLT 214 189  --   --    Basic Metabolic Panel: Recent Labs  Lab 07/17/20 2033 07/18/20 0038  NA 138 138  K 4.9 4.0  CL 104 104  CO2 26 27  GLUCOSE 128* 150*  BUN 28* 29*  CREATININE 1.56* 1.50*  CALCIUM 8.6* 8.3*   GFR: CrCl cannot be calculated (Unknown ideal weight.). Liver Function Tests: Recent Labs  Lab 07/17/20 2033  AST 29  ALT 18  ALKPHOS 59  BILITOT 1.4*  PROT 6.6  ALBUMIN 3.4*   No results for input(s): LIPASE, AMYLASE in the last 168 hours. No results for input(s): AMMONIA in the last 168 hours. Coagulation Profile: Recent Labs  Lab 07/17/20 2033  INR 1.2   Cardiac Enzymes: No results for input(s): CKTOTAL, CKMB, CKMBINDEX, TROPONINI in the last 168 hours. BNP (last 3 results) No results for input(s): PROBNP in the last 8760 hours. HbA1C: No results for input(s): HGBA1C in the last 72 hours. CBG: No results for input(s): GLUCAP in the last 168 hours. Lipid Profile: No results for input(s): CHOL, HDL, LDLCALC, TRIG, CHOLHDL, LDLDIRECT in the last 72 hours. Thyroid Function Tests: No results for input(s): TSH, T4TOTAL, FREET4, T3FREE, THYROIDAB in the last 72 hours. Anemia Panel: No results for input(s): VITAMINB12, FOLATE, FERRITIN, TIBC, IRON, RETICCTPCT in the last 72 hours. Sepsis Labs: No results for input(s): PROCALCITON, LATICACIDVEN in the last 168 hours.  Recent Results (from the past 240 hour(s))  Resp Panel by RT-PCR (Flu A&B, Covid) Nasopharyngeal Swab     Status: None   Collection Time: 07/17/20  8:33 PM   Specimen: Nasopharyngeal Swab; Nasopharyngeal(NP) swabs in vial transport medium  Result Value Ref Range Status   SARS Coronavirus 2 by RT PCR NEGATIVE NEGATIVE Final    Comment: (NOTE) SARS-CoV-2 target nucleic acids are NOT DETECTED.  The SARS-CoV-2 RNA is generally detectable in upper respiratory specimens  during the acute phase of infection. The lowest concentration of SARS-CoV-2 viral copies this assay can detect is 138 copies/mL. A negative result does not preclude SARS-Cov-2 infection and should not be used as the sole basis for treatment or other patient management decisions. A negative result may occur with  improper specimen collection/handling, submission of specimen other than nasopharyngeal swab, presence of viral mutation(s) within the areas targeted by this assay, and inadequate number of viral copies(<138 copies/mL). A negative result must be combined with clinical observations, patient history, and epidemiological information. The expected result is Negative.  Fact Sheet for Patients:  EntrepreneurPulse.com.au  Fact Sheet for Healthcare Providers:  IncredibleEmployment.be  This test is no t yet approved or cleared by the [place] FDA and  has been authorized for detection and/or diagnosis of SARS-CoV-2 by FDA under an Emergency Use Authorization (EUA). This EUA will remain  in effect (meaning this test can be used) for the duration of the COVID-19 declaration under Section 564(b)(1)  of the Act, 21 U.S.C.section 360bbb-3(b)(1), unless the authorization is terminated  or revoked sooner.       Influenza A by PCR NEGATIVE NEGATIVE Final   Influenza B by PCR NEGATIVE NEGATIVE Final    Comment: (NOTE) The Xpert Xpress SARS-CoV-2/FLU/RSV plus assay is intended as an aid in the diagnosis of influenza from Nasopharyngeal swab specimens and should not be used as a sole basis for treatment. Nasal washings and aspirates are unacceptable for Xpert Xpress SARS-CoV-2/FLU/RSV testing.  Fact Sheet for Patients: EntrepreneurPulse.com.au  Fact Sheet for Healthcare Providers: IncredibleEmployment.be  This test is not yet approved or cleared by the [place] FDA and has been authorized for detection  and/or diagnosis of SARS-CoV-2 by FDA under an Emergency Use Authorization (EUA). This EUA will remain in effect (meaning this test can be used) for the duration of the COVID-19 declaration under Section 564(b)(1) of the Act, 21 U.S.C. section 360bbb-3(b)(1), unless the authorization is terminated or revoked.  Performed at [facility], [place] [address], [place], [place] [address]          Radiology Studies: No results found.      Scheduled Meds: . flecainide  100 mg Oral QPM  . flecainide  50 mg Oral q morning  . metoprolol succinate  50 mg Oral QHS  . pantoprazole  80 mg Oral Daily  . peg 3350 powder  0.5 kit Oral Once   And  . [START ON 07/19/2020] peg 3350 powder  0.5 kit Oral Once  . simvastatin  40 mg Oral QHS   Continuous Infusions: . lactated ringers 75 mL/hr at 07/18/20 1312     LOS: 1 day   Time spent= 35 mins    Demetri Kerman Arsenio Loader, MD [facility]  If 7PM-7AM, please contact night-coverage  07/18/2020, 1:48 PM

## 2020-07-18 NOTE — Plan of Care (Signed)
Initiated care plan 

## 2020-07-18 NOTE — Progress Notes (Signed)
Pt called out to go to the bathroom NA helped him to toilet, he walked back to bed on his own called out once he got to the bed stating he went to the bathroom and when got up and walked back to the bed he felt as though he was going to pass out.  RN told him to call out when in the bathroom by using srting on the wall so we can assist him back to bed.  Patient had a large about of bright red blood passed per rectum.  RN left in toilet for day team to witness.  Pt safely back in bed, call bell within reach.  Pt opted to use urinal instead of continuing to use primofit as it had leaked some

## 2020-07-18 NOTE — Plan of Care (Signed)

## 2020-07-18 NOTE — H&P (View-Only) (Signed)
Referring Provider:  [facility]         Primary Care Physician:  Colon Branch, MD Primary Gastroenterologist:  Thornton Park, MD            We were asked to see this patient for:  Rectal bleeding            Attending physician's note   I have taken a history, examined the patient and reviewed the chart. I agree with the Advanced Practitioner's note, impression and recommendations.  86 year old M admitted with painless hematochezia.   Hemoglobin 15--->11 He is hemodynamically stable Possible etiology acute diverticular hemorrhage.  Last colonoscopy almost 10 years ago Will plan to proceed with colonoscopy to further evaluate source of lower GI bleed and exclude neoplastic lesion Hold Eliquis Clear liquid Bowel prep  He has history of GIST and multiple gastric polyps, s/p EGD/ EUS 03/2020 and is scheduled for surveillance EUS in 2023  The risks and benefits as well as alternatives of endoscopic procedure(s) have been discussed and reviewed. All questions answered. The patient agrees to proceed.   Damaris Hippo , MD [phone]    ASSESSMENT / PLAN:   # 86 yo male with painless hematochezia on Eliquis. He could be having a diverticular hemorrhage. Last colonoscopy was in 2013, see below. Patient has declined colonoscopy in recent past as part of IDA workup ( though IDA probably from the multiple gastric polyps found on EGD).  --Patient will be scheduled for a colonoscopy. The risks and benefits of colonoscopy with possible polypectomy / biopsies were discussed and the patient agrees to proceed.  --Monitor hgb. It is 12.3 today, down from 15.3 yesterday  # Hx of multiple fundic gland polyps with bleeding.in 2020.   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 Wasn't able to be seen on CT scan Feb 2022. Plan is for repeat EUS in around Jan 2023. He is followed by Dr. Burr Medico.   # Pancreatic cyst being followed by Dr. Rush Landmark. Plan is for 1 year  surveillance imaging around January 2023   # Atrial tachycardia / flutter on Eliquis. Last dose was yesterday.      HPI:                                                                                                                             Chief Complaint: Rectal bleeding  Ronald [name] is a 86 y.o. male with a past medical history significant for IDA, GIST, H.pylori infection (treated), adenomatous colon polyps, internal hemorrhoids s/p banding in 2014), Atrial tachycardia / flutter on Eliquis, HTN, hyperlipidemia, DM, kidney stones.  Patient presented to the ED by EMS last evening with complaints of rectal bleeding.  At presentation he was hemodynamically stable.  Hemoglobin at baseline at 15.3.  Today hemoglobin has declined to 12.5.   Patient says the bleeding started yesterday, he had no associated rectal or abdominal pain. Blood was bright  red with clots. He had about three episodes at home prior to arriving to the hospital. He just had another episode but unfortunately it was flushed before Nursing could see it. Patient gives a history of chronic constipation and hemorrhoids but hasn't bled from hemorrhoids in many years. His stool is dark on oral iron. His last dose of Eliquis was yesterday. He has no other GI or general medical complaints   PREVIOUS ENDOSCOPIC EVALUATIONS / PERTINENT STUDIES   January 2022 EUS for evaluation of gastric lesion Gastric mass seen on EUS findings included more than 35 mm pedunculated and sessile polyps with a few showing active bleeding and others with stigmata of recent bleeding.  3 lesions were removed.  Diffuse prominent gastric folds found in the entire stomach.  A medium sized mucosal mass with no bleeding was found in the posterior wall of the gastric antrum consistent with GIST.  Endosonographic findings included an subepithelial lesion in the antrum of the stomach.  A tissue diagnosis was obtained prior to this exam and showed a stromal cell  (smooth muscle) neoplasm.  A cystic lesion was seen in the pancreatic body, tissue not obtained but appear to be benign.  Many stones seen in the gallbladder.  Nonmalignant appearing lymph nodes visualized in the celiac region, peripancreatic region and porta hepatis region FINAL MICROSCOPIC DIAGNOSIS:   A. DUODENUM, BIOPSY:  - Duodenal mucosa with no significant pathologic findings.  - Negative for increased intraepithelial lymphocytes and villous  architectural changes.   B. STOMACH, ENDOSCOPIC MUCOSAL RESECTION OF POLYPS:  - Hyperplastic polyps, ulcerated and inflamed with reactive changes.  - Negative for dysplasia and malignancy.    July 2020 EGD for evaluation of iron deficiency anemia No gross lesions in esophagus. Z-line irregular, 40 cm from the incisors. - Congested, erythematous, friable (with spontaneous bleeding), granular and inflamed mucosa in the cardia extending into multiple hyperplastic appearing polyps was noted (surrounds the near entirety of the cardia). - Multiple gastric polyps. 8 were resected and retrieved. Clips (MR conditional) were placed as noted above to decrease risk of bleeding post-intervention. - Biopsies were taken with a cold forceps for histology and Helicobacter pylori testing. - A single submucosal papule (nodule) found in the stomach. - Small duodenal nodule in bulb but no other gross lesions in the duodenal bulb, in the first portion of the duodenum and in the second portion of the duodenum.  Stomach, biopsy - HYPERPLASTIC GASTRIC POLYP - NO H. PYLORI, INTESTINAL METAPLASIA OR MALIGNANCY IDENTIFIED 2. Stomach, biopsy - HYPERPLASTIC GASTRIC POLYP(S) - FUNDIC GLAND POLYP - NO H. PYLORI, INTESTINAL METAPLASIA OR MALIGNANCY IDENTIFIED  01/31/2019 EGD and EUS EGD Impression: - No gross lesions in esophagus. - Multiple gastric polyps. 3 were resected and retrieved (2 via Mucosal resection & 1 via Hot snare polypectomy). Clips (MR conditional)  were placed. - A single submucosal papule (nodule) found in the stomach. - Endoclips were found in the stomach (previously placed). - A single duodenal polyp. Resected and retrieved. Clip (MR conditional) was placed. EUS Impression: - An intramural (subepithelial) lesion was found in the antrum of the stomach. The lesion appeared to originate from within the muscularis propria (Layer 4). Tissue was obtained from this exam, and results are pending. However, the endosonographic appearance is suspicious for a stromal cell (smooth muscle) neoplasm, of indeterminate biological behavior. Fine needle biopsy performed. Wall thickening was seen in the body of the stomach. The thickening appeared to be primarily within the luminal interface/superficial mucosa (Layer 1) and  deep mucosa (Layer 2). - Multiple stones were visualized endosonographically in the gallbladder. FINAL MICROSCOPIC DIAGNOSIS:   A. POSTERIOR WALL OF ANTRUM NODULE, FINE NEEDLE ASPIRATION:  - Consistent with gastrointestinal stromal tumor.   2013 colonoscopy --complete exam, excellent prep --Several small polyps < 10 mm --diverticulosis  Path - multiple fragments of hyperplastic polyps  Past Medical History:  Diagnosis Date  . Arthritis    one finger  . BPH (benign prostatic hypertrophy)    f/u @ Biopsy, s/p bx 2009 apro (-)  . Cataract   . Colon polyp   . Complication of anesthesia    violent upon waking up (only 1 time)   . Diverticulosis   . Dysrhythmia    A-flutter/Afib  . Early cataract    left  . Edema leg    asymetric , L > R , [place] neg for DVT 09-2010  . Gastric polyps   . GERD (gastroesophageal reflux disease)   . Hemorrhoids   . History of kidney stones   . Hypercalcemia    parathyroidectomy 04/2008  . Hyperlipidemia   . Hypertension   . Iron deficiency anemia   . Polyp, stomach   . Pre-diabetes   . Wears dentures   . Wears glasses     Past Surgical History:  Procedure Laterality Date  . A-FLUTTER  ABLATION N/A 08/12/2017   Procedure: A-FLUTTER ABLATION;  Surgeon: Deboraha Sprang, MD;  Location: [place] [facility];  Service: Cardiovascular;  Laterality: N/A;  . APPENDECTOMY    . BIOPSY  09/20/2018   Procedure: BIOPSY;  Surgeon: Rush Landmark Telford Nab., MD;  Location: [place];  Service: Gastroenterology;;  . CATARACT EXTRACTION Right   . COLONOSCOPY  12/16/2011   Procedure: COLONOSCOPY;  Surgeon: Inda Castle, MD;  Location: [facility];  Service: Endoscopy;  Laterality: N/A;  . ENDOSCOPIC MUCOSAL RESECTION N/A 09/20/2018   Procedure: ENDOSCOPIC MUCOSAL RESECTION;  Surgeon: Rush Landmark Telford Nab., MD;  Location: [place];  Service: Gastroenterology;  Laterality: N/A;  . ENDOSCOPIC MUCOSAL RESECTION  01/31/2019   Procedure: ENDOSCOPIC MUCOSAL RESECTION;  Surgeon: Rush Landmark Telford Nab., MD;  Location: [facility];  Service: Gastroenterology;;  . ESOPHAGOGASTRODUODENOSCOPY N/A 01/17/2013   Procedure: ESOPHAGOGASTRODUODENOSCOPY (EGD);  Surgeon: Inda Castle, MD;  Location: [facility];  Service: Endoscopy;  Laterality: N/A;  . ESOPHAGOGASTRODUODENOSCOPY (EGD) WITH PROPOFOL N/A 09/20/2018   Procedure: ESOPHAGOGASTRODUODENOSCOPY (EGD) WITH PROPOFOL;  Surgeon: Rush Landmark Telford Nab., MD;  Location: [place];  Service: Gastroenterology;  Laterality: N/A;  . ESOPHAGOGASTRODUODENOSCOPY (EGD) WITH PROPOFOL N/A 01/31/2019   Procedure: ESOPHAGOGASTRODUODENOSCOPY (EGD) WITH PROPOFOL;  Surgeon: Rush Landmark Telford Nab., MD;  Location: [place];  Service: Gastroenterology;  Laterality: N/A;  . ESOPHAGOGASTRODUODENOSCOPY (EGD) WITH PROPOFOL N/A 04/04/2020   Procedure: ESOPHAGOGASTRODUODENOSCOPY (EGD) WITH PROPOFOL;  Surgeon: Rush Landmark Telford Nab., MD;  Location: [facility];  Service: Gastroenterology;  Laterality: N/A;  . EUS N/A 01/31/2019   Procedure: UPPER ENDOSCOPIC ULTRASOUND (EUS) RADIAL;  Surgeon: Rush Landmark Telford Nab., MD;  Location: [place];  Service:  Gastroenterology;  Laterality: N/A;  . EUS N/A 04/04/2020   Procedure: UPPER ENDOSCOPIC ULTRASOUND (EUS) RADIAL;  Surgeon: Rush Landmark Telford Nab., MD;  Location: [facility];  Service: Gastroenterology;  Laterality: N/A;  . EXTERNAL EAR SURGERY  01/2016   BCC removal  . EYE SURGERY  infant   "lazy eye"  . FINE NEEDLE ASPIRATION  01/31/2019   Procedure: FINE NEEDLE ASPIRATION (FNA) LINEAR;  Surgeon: Irving Copas., MD;  Location: [place];  Service: Gastroenterology;;  . Otho Darner SIGMOIDOSCOPY  02/09/2012   Procedure: FLEXIBLE SIGMOIDOSCOPY;  Surgeon: Inda Castle, MD;  Location: [facility];  Service: Endoscopy;  Laterality: N/A;  . HEMORRHOID BANDING  02/09/2012   Procedure: HEMORRHOID BANDING;  Surgeon: Inda Castle, MD;  Location: [facility];  Service: Endoscopy;  Laterality: N/A;  . HEMOSTASIS CLIP PLACEMENT  09/20/2018   Procedure: HEMOSTASIS CLIP PLACEMENT;  Surgeon: Irving Copas., MD;  Location: [place];  Service: Gastroenterology;;  . HEMOSTASIS CLIP PLACEMENT  01/31/2019   Procedure: HEMOSTASIS CLIP PLACEMENT;  Surgeon: Irving Copas., MD;  Location: [place];  Service: Gastroenterology;;  . HEMOSTASIS CLIP PLACEMENT  04/04/2020   Procedure: HEMOSTASIS CLIP PLACEMENT;  Surgeon: Irving Copas., MD;  Location: [facility];  Service: Gastroenterology;;  . HEMOSTASIS CONTROL  04/04/2020   Procedure: HEMOSTASIS CONTROL;  Surgeon: Irving Copas., MD;  Location: [facility];  Service: Gastroenterology;;  snare cautery  . HOT HEMOSTASIS N/A 01/17/2013   Procedure: HOT HEMOSTASIS (ARGON PLASMA COAGULATION/BICAP);  Surgeon: Inda Castle, MD;  Location: [facility];  Service: Endoscopy;  Laterality: N/A;  . KIDNEY STONE SURGERY    . MULTIPLE TOOTH EXTRACTIONS    . PARATHYROIDECTOMY  04/2008  . POLYPECTOMY  09/20/2018   Procedure: POLYPECTOMY;  Surgeon: Mansouraty, Telford Nab., MD;  Location: [place];  Service:  Gastroenterology;;  . POLYPECTOMY  01/31/2019   Procedure: POLYPECTOMY;  Surgeon: Irving Copas., MD;  Location: [place];  Service: Gastroenterology;;  . POLYPECTOMY  04/04/2020   Procedure: POLYPECTOMY;  Surgeon: Irving Copas., MD;  Location: [facility];  Service: Gastroenterology;;  . Clide Deutscher  09/20/2018   Procedure: Clide Deutscher;  Surgeon: Irving Copas., MD;  Location: [place];  Service: Gastroenterology;;  . Clide Deutscher  01/31/2019   Procedure: Clide Deutscher;  Surgeon: Irving Copas., MD;  Location: [place];  Service: Gastroenterology;;  . Lia Foyer INJECTION  04/04/2020   Procedure: SUBMUCOSAL INJECTION;  Surgeon: Irving Copas., MD;  Location: [facility];  Service: Gastroenterology;;  EPI Injection  . TONSILLECTOMY      Prior to Admission medications   Medication Sig Start Date End Date Taking? Authorizing Provider  apixaban (ELIQUIS) 5 MG TABS tablet Take 1 tablet (5 mg total) by mouth 2 (two) times daily. 04/07/20  Yes Mansouraty, Telford Nab., MD  cholecalciferol (VITAMIN D) 1000 UNITS tablet Take 1,000 Units by mouth every evening.    Yes [provider]  ferrous sulfate 325 (65 FE) MG tablet Take 325 mg by mouth daily at 12 noon.   Yes [provider]  flecainide (TAMBOCOR) 100 MG tablet Take 1 tablet (100 mg total) by mouth every evening. 05/14/20  Yes Deboraha Sprang, MD  flecainide (TAMBOCOR) 50 MG tablet TAKE 1 TABLET BY MOUTH ONCE DAILY IN THE MORNING 05/29/20  Yes Deboraha Sprang, MD  metoprolol succinate (TOPROL-XL) 50 MG 24 hr tablet Take 1 tablet by mouth once daily 05/29/20  Yes Deboraha Sprang, MD  Multiple Vitamin (MULTIVITAMIN WITH MINERALS) TABS tablet Take 1 tablet by mouth daily at 12 noon.   Yes [provider]  Multiple Vitamins-Minerals (PRESERVISION AREDS 2 PO) Take 1 tablet by mouth every evening.   Yes [provider]  NIFEdipine (PROCARDIA XL/NIFEDICAL XL)  60 MG 24 hr tablet Take 1 tablet (60 mg total) by mouth daily. 05/30/20  Yes Colon Branch, MD  omeprazole (PRILOSEC) 40 MG capsule Take 1 capsule (40 mg total) by mouth 2 (two) times daily before a meal. 05/28/20  Yes Paz, [place],  MD  Saw Palmetto 450 MG CAPS Take 450 mg by mouth every evening.   Yes [provider]  simvastatin (ZOCOR) 40 MG tablet Take 1 tablet (40 mg total) by mouth at bedtime. 05/28/20  Yes Colon Branch, MD    Current Facility-Administered Medications  Medication Dose Route Frequency Provider Last Rate Last Admin  . acetaminophen (TYLENOL) tablet 650 mg  650 mg Oral Q6H PRN Chotiner, Yevonne Aline, MD       Or  . acetaminophen (TYLENOL) suppository 650 mg  650 mg Rectal Q6H PRN Chotiner, Yevonne Aline, MD      . flecainide (TAMBOCOR) tablet 100 mg  100 mg Oral QPM Chotiner, Yevonne Aline, MD   100 mg at 07/18/20 0013  . flecainide (TAMBOCOR) tablet 50 mg  50 mg Oral q morning Chotiner, Yevonne Aline, MD      . lactated ringers infusion   Intravenous Continuous Chotiner, Yevonne Aline, MD 75 mL/hr at 07/17/20 2322 New Bag at 07/17/20 2322  . metoprolol succinate (TOPROL-XL) 24 hr tablet 50 mg  50 mg Oral QHS Chotiner, Yevonne Aline, MD   50 mg at 07/18/20 0013  . ondansetron (ZOFRAN) tablet 4 mg  4 mg Oral Q6H PRN Chotiner, Yevonne Aline, MD       Or  . ondansetron (ZOFRAN) injection 4 mg  4 mg Intravenous Q6H PRN Chotiner, Yevonne Aline, MD      . pantoprazole (PROTONIX) EC tablet 80 mg  80 mg Oral Daily Chotiner, Yevonne Aline, MD      . simvastatin (ZOCOR) tablet 40 mg  40 mg Oral QHS Chotiner, Yevonne Aline, MD   40 mg at 07/18/20 0013    Allergies as of 07/17/2020 - Review Complete 07/17/2020  Allergen Reaction Noted  . Cucumber extract Nausea And Vomiting 10/06/2011  . Sulfonamide derivatives Other (See Comments) 02/09/2007    Family History  Problem Relation Age of Onset  . Heart attack Father 26  . Heart attack Mother   . Pneumonia Sister 6  . Colon cancer Neg Hx   . Prostate cancer Neg Hx    . Diabetes Neg Hx     Social History   Socioeconomic History  . Marital status: Widowed    Spouse name: Not on file  . Number of children: 3  . Years of education: Not on file  . Highest education level: Not on file  Occupational History  . Occupation: retired   Tobacco Use  . Smoking status: Former Smoker    Packs/day: 0.50    Years: 25.00    Pack years: 12.50    Types: Cigarettes    Quit date: 11/30/1981    Years since quitting: 38.6  . Smokeless tobacco: Never Used  Vaping Use  . Vaping Use: Never used  Substance and Sexual Activity  . Alcohol use: Yes    Alcohol/week: 7.0 standard drinks    Types: 7 Glasses of wine per week    Comment: occasional beer with dinner  . Drug use: No  . Sexual activity: Not on file  Other Topics Concern  . Not on file  Social History Narrative   Retired, single, has  a girlfriend , 3 children  (one lives in town)   Daughter: she is the HPA       Social Determinants of Radio broadcast assistant Strain: Low Risk   . Difficulty of Paying Living Expenses: Not hard at all  Food Insecurity: No Food Insecurity  . Worried About Estate manager/land agent  of Food in the Last Year: Never true  . Ran Out of Food in the Last Year: Never true  Transportation Needs: No Transportation Needs  . Lack of Transportation (Medical): No  . Lack of Transportation (Non-Medical): No  Physical Activity: Not on file  Stress: Not on file  Social Connections: Not on file  Intimate Partner Violence: Not on file    Review of Systems: All systems reviewed and negative except where noted in HPI.  OBJECTIVE:    Physical Exam: Vital signs in last 24 hours: Temp:  [97.9 F (36.6 C)-98.3 F (36.8 C)] 98.3 F (36.8 C) (05/11 0649) Pulse Rate:  [56-69] 56 (05/11 0649) Resp:  [12-21] 20 (05/11 0649) BP: (104-149)/(50-90) 104/50 (05/11 0649) SpO2:  [89 %-98 %] 89 % (05/11 0649) Last BM Date: 07/17/20 General:   Alert  male in NAD Psych:  Pleasant, cooperative.  Normal mood and affect. Eyes:  Pupils equal, sclera clear, no icterus.   Conjunctiva pink. Ears:  Normal auditory acuity. Nose:  No deformity, discharge,  or lesions. Neck:  Supple; no masses Lungs:  Clear throughout to auscultation.   No wheezes, crackles, or rhonchi.  Heart:  Regular rate and rhythm; 1+ bilateral lower extremity edema Abdomen:  Soft, non-distended, nontender, BS active, no palp mass   Rectal:  Deferred  Msk:  Symmetrical without gross deformities. . Neurologic:  Alert and  oriented x4;  grossly normal neurologically. Skin:  Intact without significant lesions or rashes.  There were no vitals filed for this visit.   Scheduled inpatient medications . flecainide  100 mg Oral QPM  . flecainide  50 mg Oral q morning  . metoprolol succinate  50 mg Oral QHS  . pantoprazole  80 mg Oral Daily  . simvastatin  40 mg Oral QHS      Intake/Output from previous day: 05/10 0701 - 05/11 0700 In: 555.7 [I.V.:455.7; IV Piggyback:100] Out: -  Intake/Output this shift: No intake/output data recorded.   Lab Results: Recent Labs    07/17/20 2033 07/18/20 0038 07/18/20 0355  WBC 13.1* 9.4  --   HGB 15.3 13.2 12.5*  HCT 46.3 39.6 37.4*  PLT 214 189  --    BMET Recent Labs    07/17/20 2033 07/18/20 0038  NA 138 138  K 4.9 4.0  CL 104 104  CO2 26 27  GLUCOSE 128* 150*  BUN 28* 29*  CREATININE 1.56* 1.50*  CALCIUM 8.6* 8.3*   LFT Recent Labs    07/17/20 2033  PROT 6.6  ALBUMIN 3.4*  AST 29  ALT 18  ALKPHOS 59  BILITOT 1.4*   PT/INR Recent Labs    07/17/20 2033  LABPROT 14.7  INR 1.2   Hepatitis Panel No results for input(s): HEPBSAG, HCVAB, HEPAIGM, HEPBIGM in the last 72 hours.   . CBC Latest Ref Rng & Units 07/18/2020 07/18/2020 07/17/2020  WBC 4.0 - 10.5 K/uL - 9.4 13.1(H)  Hemoglobin 13.0 - 17.0 g/dL 12.5(L) 13.2 15.3  Hematocrit 39.0 - 52.0 % 37.4(L) 39.6 46.3  Platelets 150 - 400 K/uL - 189 214    . CMP Latest Ref Rng & Units  07/18/2020 07/17/2020 04/17/2020  Glucose 70 - 99 mg/dL 150(H) 128(H) -  BUN 8 - 23 mg/dL 29(H) 28(H) -  Creatinine 0.61 - 1.24 mg/dL 1.50(H) 1.56(H) 1.20  Sodium 135 - 145 mmol/L 138 138 -  Potassium 3.5 - 5.1 mmol/L 4.0 4.9 -  Chloride 98 - 111 mmol/L 104 104 -  CO2 22 -  32 mmol/L 27 26 -  Calcium 8.9 - 10.3 mg/dL 8.3(L) 8.6(L) -  Total Protein 6.5 - 8.1 g/dL - 6.6 -  Total Bilirubin 0.3 - 1.2 mg/dL - 1.4(H) -  Alkaline Phos 38 - 126 U/L - 59 -  AST 15 - 41 U/L - 29 -  ALT 0 - 44 U/L - 18 -   Studies/Results: No results found.  Active Problems:   Essential hypertension   Calculus of kidney   A-fib (HCC)   Acute lower GI bleeding   AKI (acute kidney injury) ([place])   Chronic anticoagulation    Tye Savoy, NP-C @  07/18/2020, 9:01 AM

## 2020-07-19 ENCOUNTER — Inpatient Hospital Stay (HOSPITAL_COMMUNITY): Payer: PPO | Admitting: Certified Registered"

## 2020-07-19 ENCOUNTER — Encounter (HOSPITAL_COMMUNITY): Payer: Self-pay | Admitting: Family Medicine

## 2020-07-19 ENCOUNTER — Encounter (HOSPITAL_COMMUNITY)
Admission: EM | Disposition: A | Discharge status: Home / Self Care | Payer: Self-pay | Source: Home / Self Care | Attending: Internal Medicine

## 2020-07-19 DIAGNOSIS — K5731 Diverticulosis of large intestine without perforation or abscess with bleeding: Secondary | ICD-10-CM

## 2020-07-19 DIAGNOSIS — D123 Benign neoplasm of transverse colon: Secondary | ICD-10-CM

## 2020-07-19 DIAGNOSIS — D122 Benign neoplasm of ascending colon: Secondary | ICD-10-CM

## 2020-07-19 HISTORY — PX: COLONOSCOPY WITH PROPOFOL: SHX5780

## 2020-07-19 HISTORY — PX: POLYPECTOMY: SHX5525

## 2020-07-19 LAB — BASIC METABOLIC PANEL
Anion gap: 4 — ABNORMAL LOW (ref 5–15)
BUN: 33 mg/dL — ABNORMAL HIGH (ref 8–23)
CO2: 27 mmol/L (ref 22–32)
Calcium: 8 mg/dL — ABNORMAL LOW (ref 8.9–10.3)
Chloride: 109 mmol/L (ref 98–111)
Creatinine, Ser: 1.52 mg/dL — ABNORMAL HIGH (ref 0.61–1.24)
GFR, Estimated: 45 mL/min — ABNORMAL LOW (ref >60)
Glucose, Bld: 94 mg/dL (ref 70–99)
Potassium: 3.7 mmol/L (ref 3.5–5.1)
Sodium: 140 mmol/L (ref 135–145)

## 2020-07-19 LAB — CBC
HCT: 31.9 % — ABNORMAL LOW (ref 39.0–52.0)
Hemoglobin: 10.6 g/dL — ABNORMAL LOW (ref 13.0–17.0)
MCH: 31 pg (ref 26.0–34.0)
MCHC: 33.2 g/dL (ref 30.0–36.0)
MCV: 93.3 fL (ref 80.0–100.0)
Platelets: 149 10*3/uL — ABNORMAL LOW (ref 150–400)
RBC: 3.42 MIL/uL — ABNORMAL LOW (ref 4.22–5.81)
RDW: 13.6 % (ref 11.5–15.5)
WBC: 7.1 10*3/uL (ref 4.0–10.5)
nRBC: 0 % (ref 0.0–0.2)

## 2020-07-19 LAB — HEMOGLOBIN AND HEMATOCRIT, BLOOD
HCT: 31.1 % — ABNORMAL LOW (ref 39.0–52.0)
HCT: 31.4 % — ABNORMAL LOW (ref 39.0–52.0)
HCT: 33.2 % — ABNORMAL LOW (ref 39.0–52.0)
Hemoglobin: 10.4 g/dL — ABNORMAL LOW (ref 13.0–17.0)
Hemoglobin: 10.2 g/dL — ABNORMAL LOW (ref 13.0–17.0)
Hemoglobin: 11.1 g/dL — ABNORMAL LOW (ref 13.0–17.0)

## 2020-07-19 LAB — MAGNESIUM: Magnesium: 1.9 mg/dL (ref 1.7–2.4)

## 2020-07-19 SURGERY — COLONOSCOPY WITH PROPOFOL
Anesthesia: Monitor Anesthesia Care

## 2020-07-19 MED ORDER — PROPOFOL 500 MG/50ML IV EMUL
INTRAVENOUS | Status: DC | PRN
  Administered 2020-07-19: 125 ug/kg/min via INTRAVENOUS

## 2020-07-19 MED ORDER — LACTATED RINGERS IV SOLN: INTRAVENOUS | Status: DC

## 2020-07-19 MED ORDER — LIDOCAINE HCL (CARDIAC) PF 100 MG/5ML IV SOSY
PREFILLED_SYRINGE | INTRAVENOUS | Status: DC | PRN
  Administered 2020-07-19: 60 mg via INTRAVENOUS

## 2020-07-19 MED ORDER — EPHEDRINE SULFATE-NACL 50-0.9 MG/10ML-% IV SOSY
PREFILLED_SYRINGE | INTRAVENOUS | Status: DC | PRN
  Administered 2020-07-19: 5 mg via INTRAVENOUS

## 2020-07-19 MED ORDER — SODIUM CHLORIDE 0.9 % IV BOLUS: 500.0000 mL | Freq: Once | INTRAVENOUS | Status: DC

## 2020-07-19 MED ORDER — PHENYLEPHRINE 40 MCG/ML (10ML) SYRINGE FOR IV PUSH (FOR BLOOD PRESSURE SUPPORT)
PREFILLED_SYRINGE | INTRAVENOUS | Status: DC | PRN
  Administered 2020-07-19: 40 ug via INTRAVENOUS
  Administered 2020-07-19: 80 ug via INTRAVENOUS
  Administered 2020-07-19: 40 ug via INTRAVENOUS
  Administered 2020-07-19: 80 ug via INTRAVENOUS
  Administered 2020-07-19: 40 ug via INTRAVENOUS
  Administered 2020-07-19: 80 ug via INTRAVENOUS
  Administered 2020-07-19: 40 ug via INTRAVENOUS

## 2020-07-19 SURGICAL SUPPLY — 22 items

## 2020-07-19 NOTE — Op Note (Signed)
[facility] Patient Name: Ronald [name] Procedure Date: 07/19/2020 MRN: [id number] Attending MD: Jerene Bears , MD Date of Birth: 11/21/1934 CSN: [id number] Age: 86 Admit Type: Inpatient Procedure:                Colonoscopy Indications:              Hematochezia (painless) Providers:                Lajuan Lines. Hilarie Fredrickson, MD, Benay Pillow, RN, Janee Morn, Technician Referring MD:             [facility] Medicines:                Monitored Anesthesia Care Complications:            No immediate complications. Estimated Blood Loss:     Estimated blood loss: none. Procedure:                Pre-Anesthesia Assessment:                           - Prior to the procedure, a History and Physical                            was performed, and patient medications and                            allergies were reviewed. The patient's tolerance of                            previous anesthesia was also reviewed. The risks                            and benefits of the procedure and the sedation                            options and risks were discussed with the patient.                            All questions were answered, and informed consent                            was obtained. Prior Anticoagulants: The patient has                            taken Eliquis (apixaban), last dose was 2 days                            prior to procedure. ASA Grade Assessment: III - A                            patient with severe systemic disease. After  reviewing the risks and benefits, the patient was                            deemed in satisfactory condition to undergo the                            procedure.                           After obtaining informed consent, the colonoscope                            was passed under direct vision. Throughout the                            procedure, the patient's blood pressure, pulse,  and                            oxygen saturations were monitored continuously. The                            CF-HQ190L ([id number] Olympus colonoscope was                            introduced through the anus and advanced to the                            terminal ileum. The colonoscopy was performed                            without difficulty. The patient tolerated the                            procedure well. The quality of the bowel                            preparation was good. The terminal ileum, ileocecal                            valve, appendiceal orifice, and rectum were                            photographed. Scope In: 12:14:04 PM Scope Out: 12:38:01 PM Total Procedure Duration: 0 hours 23 minutes 57 seconds  Findings:      The digital rectal exam was normal.      The terminal ileum appeared normal.      Two sessile polyps were found in the ascending colon. The polyps were 5       to 8 mm in size. These polyps were removed with a cold snare. Resection       and retrieval were complete.      A 6 mm polyp was found in the hepatic flexure. The polyp was sessile.       The polyp was removed with a cold snare. Resection and retrieval were       complete.  Multiple small and large-mouthed diverticula were found in the proximal       sigmoid colon and distal descending colon. No evidence of active       bleeding and no stigmata of bleeding.      Internal hemorrhoids were found during retroflexion. The hemorrhoids       were small.      The exam was otherwise without abnormality. Impression:               - The examined portion of the ileum was normal.                           - Two 5 to 8 mm polyps in the ascending colon,                            removed with a cold snare. Resected and retrieved.                           - One 6 mm polyp at the hepatic flexure, removed                            with a cold snare. Resected and retrieved.                           -  Diverticulosis in the proximal sigmoid colon and                            in the distal descending colon. No evidence of                            active or recent bleeding. Presumed source of                            recent painless hematochezia (now resolved).                           - Small internal hemorrhoids.                           - The examination was otherwise normal. Moderate Sedation:      N/A Recommendation:           - Return patient to hospital ward for ongoing care.                           - Advance diet as tolerated.                           - Continue present medications. Okay to resume                            Eliquis tomorrow with observation for recurrent GI                            bleeding.                           -  Await pathology results.                           - No repeat colonoscopy due to age at next                            surveillance interval.                           - GI will sign off, but remain available. Call if                            questions. Procedure Code(s):        --- Professional ---                           [phone], Colonoscopy, flexible; with removal of                            tumor(s), polyp(s), or other lesion(s) by snare                            technique Diagnosis Code(s):        --- Professional ---                           K64.8, Other hemorrhoids                           K63.5, Polyp of colon                           K92.1, Melena (includes Hematochezia)                           K57.30, Diverticulosis of large intestine without                            perforation or abscess without bleeding CPT copyright 2019 American Medical Association. All rights reserved. The codes documented in this report are preliminary and upon coder review may  be revised to meet current compliance requirements. Jerene Bears, MD 07/19/2020 1:05:15 PM This report has been signed electronically. Number of Addenda: 0

## 2020-07-19 NOTE — Progress Notes (Signed)
Notified on call provider about patient's heart rate due to patient receiving metoprolol. On call provider gave new orders and to hold metoprolol since patient heart rate was 57.

## 2020-07-19 NOTE — Anesthesia Preprocedure Evaluation (Addendum)
Anesthesia Evaluation  Patient identified by MRN, date of birth, ID band Patient awake    Reviewed: Allergy & Precautions, H&P , NPO status , Patient's Chart, lab work & pertinent test results  Airway Mallampati: II  TM Distance: >3 FB Neck ROM: Full    Dental no notable dental hx. (+) Upper Dentures, Partial Lower, Dental Advisory Given   Pulmonary neg pulmonary ROS, former smoker,    breath sounds clear to auscultation       Cardiovascular Exercise Tolerance: Good hypertension, Pt. on medications and Pt. on home beta blockers Normal cardiovascular exam+ dysrhythmias Atrial Fibrillation  Rhythm:Regular Rate:Normal  Sinus rhythm Left anterior fascicular block Abnormal R-wave progression, late transition Confirmed by Dene Gentry [phone]) on 07/17/2020 8:14:48 PM   Neuro/Psych negative neurological ROS  negative psych ROS   GI/Hepatic Neg liver ROS, GERD  Medicated,  Endo/Other  diabetesS/p parathyroidectomy  Renal/GU Renal disease (BPH)  negative genitourinary   Musculoskeletal  (+) Arthritis ,   Abdominal   Peds  Hematology  (+) anemia ,   Anesthesia Other Findings   Reproductive/Obstetrics negative OB ROS                          Anesthesia Physical  Anesthesia Plan  ASA: III  Anesthesia Plan: MAC   Post-op Pain Management:    Induction: Intravenous  PONV Risk Score and Plan: 1 and Propofol infusion  Airway Management Planned: Simple Face Mask  Additional Equipment:   Intra-op Plan:   Post-operative Plan:   Informed Consent:     Dental advisory given  Plan Discussed with: CRNA and Anesthesiologist  Anesthesia Plan Comments:         Anesthesia Quick Evaluation

## 2020-07-19 NOTE — Progress Notes (Signed)
Pt signed informed consent for colonoscopy and nurse placed consent in pt's shadow chart.

## 2020-07-19 NOTE — Anesthesia Postprocedure Evaluation (Signed)
Anesthesia Post Note  Patient: Wael Maestas Waldroup  Procedure(s) Performed: COLONOSCOPY WITH PROPOFOL (N/A ) POLYPECTOMY     Patient location during evaluation: Endoscopy Anesthesia Type: MAC Level of consciousness: awake and alert Pain management: pain level controlled Vital Signs Assessment: post-procedure vital signs reviewed and stable Respiratory status: spontaneous breathing, nonlabored ventilation and respiratory function stable Cardiovascular status: blood pressure returned to baseline and stable Postop Assessment: no apparent nausea or vomiting Anesthetic complications: no   No complications documented.  Last Vitals:  Vitals:   07/19/20 1250 07/19/20 1300  BP: 123/62 (!) 115/46  Pulse: 69 67  Resp: 19 18  Temp:    SpO2: 100% 94%    Last Pain:  Vitals:   07/19/20 1300  TempSrc:   PainSc: 0-No pain                 Merlinda Frederick

## 2020-07-19 NOTE — Progress Notes (Signed)
PROGRESS NOTE    Ronald [name]  CHE:[id number] DOB: March 20, 1934 DOA: 07/17/2020 PCP: Colon Branch, MD   Brief Narrative:  86 year old with history of atrial fibrillation, HTN, GERD comes to the hospital with few episodes of bright red blood per rectum.  Initial hemoglobin was noted to be around 15 the following day dropped down to 12.5.  Eliquis was held, GI team was consulted with plans for colonoscopy.  Colonoscopy performed on 5/12 showed multiple polyps which were removed, small internal hemorrhoids and diverticulosis.  No obvious evidence of active bleeding but likely the source was diverticulosis for his bleeding.   Assessment & Plan:   Active Problems:   Essential hypertension   Calculus of kidney   A-fib (HCC)   Acute lower GI bleeding   AKI (acute kidney injury) ([place])   Chronic anticoagulation   Diverticulosis of colon with hemorrhage  Acute lower GI bleed, bright red blood per rectum History of multiple gastric polyps, GIST tumor, pancreatic cyst - Patient has not had colonoscopy in about 9 years. - Status post C-scope 5/12- multiple polyps removed, small internal hemorrhoids and diverticulosis.  No active bleeding. - Okay to resume Eliquis tomorrow - Baseline hemoglobin 15.3, today 10.2.  Continue to monitor this. - GI team following. - PPI twice daily  Acute kidney injury - Baseline creatinine 1.1, admission creatinine 1.5.  Gentle hydration.  Monitor urine output  Essential hypertension - Metoprolol 50 mg daily.  Continue flecainide  History of paroxysmal atrial fibrillation - Patient is on flecainide.  Eliquis is on hold, likely will resume tomorrow  Hyperlipidemia - Zocor    DVT prophylaxis: SCDs Start: 07/17/20 2312 Code Status: Full code Family Communication: None at bedside  Status is: Inpatient  Remains inpatient appropriate because:Inpatient level of care appropriate due to severity of illness   Dispo: The patient is from: Home               Anticipated d/c is to: Home              Patient currently is not medically stable to d/c.  Status post colonoscopy today.  Overnight monitoring per GI discharge tomorrow if stable   Difficult to place patient No     Subjective: Patient seen and examined before his colonoscopy, no complaints.  No further evidence of bleeding  Review of Systems Otherwise negative except as per HPI, including: General: Denies fever, chills, night sweats or unintended weight loss. Resp: Denies cough, wheezing, shortness of breath. Cardiac: Denies chest pain, palpitations, orthopnea, paroxysmal nocturnal dyspnea. GI: Denies abdominal pain, nausea, vomiting, diarrhea or constipation GU: Denies dysuria, frequency, hesitancy or incontinence MS: Denies muscle aches, joint pain or swelling Neuro: Denies headache, neurologic deficits (focal weakness, numbness, tingling), abnormal gait Psych: Denies anxiety, depression, SI/HI/AVH Skin: Denies new rashes or lesions ID: Denies sick contacts, exotic exposures, travel  Examination: Constitutional: Not in acute distress Respiratory: Clear to auscultation bilaterally Cardiovascular: Normal sinus rhythm, no rubs Abdomen: Nontender nondistended good bowel sounds Musculoskeletal: No edema noted Skin: No rashes seen Neurologic: CN 2-12 grossly intact.  And nonfocal Psychiatric: Normal judgment and insight. Alert and oriented x 3. Normal mood.    Objective: Vitals:   07/19/20 1248 07/19/20 1250 07/19/20 1300 07/19/20 1446  BP: (!) 115/59 123/62 (!) 115/46 (!) 147/91  Pulse: 66 69 67 65  Resp: 18 19 18 20   Temp:    98.5 F (36.9 C)  TempSrc:    Oral  SpO2: 100% 100% 94% 94%  Intake/Output Summary (Last 24 hours) at 07/19/2020 1450 Last data filed at 07/19/2020 1248 Gross per 24 hour  Intake 2917 ml  Output --  Net 2917 ml   There were no vitals filed for this visit.   Data Reviewed:   CBC: Recent Labs  Lab 07/17/20 2033 07/18/20 0038  07/18/20 0355 07/18/20 1207 07/18/20 1815 07/19/20 0032 07/19/20 0647 07/19/20 1359  WBC 13.1* 9.4  --   --   --  7.1  --   --   NEUTROABS 10.6*  --   --   --   --   --   --   --   HGB 15.3 13.2   < > 11.1* 11.3* 10.6* 10.4* 10.2*  HCT 46.3 39.6   < > 32.8* 34.1* 31.9* 31.1* 31.4*  MCV 92.6 93.0  --   --   --  93.3  --   --   PLT 214 189  --   --   --  149*  --   --    < > = values in this interval not displayed.   Basic Metabolic Panel: Recent Labs  Lab 07/17/20 2033 07/18/20 0038 07/19/20 0032  NA 138 138 140  K 4.9 4.0 3.7  CL 104 104 109  CO2 26 27 27   GLUCOSE 128* 150* 94  BUN 28* 29* 33*  CREATININE 1.56* 1.50* 1.52*  CALCIUM 8.6* 8.3* 8.0*  MG  --   --  1.9   GFR: CrCl cannot be calculated (Unknown ideal weight.). Liver Function Tests: Recent Labs  Lab 07/17/20 2033  AST 29  ALT 18  ALKPHOS 59  BILITOT 1.4*  PROT 6.6  ALBUMIN 3.4*   No results for input(s): LIPASE, AMYLASE in the last 168 hours. No results for input(s): AMMONIA in the last 168 hours. Coagulation Profile: Recent Labs  Lab 07/17/20 2033  INR 1.2   Cardiac Enzymes: No results for input(s): CKTOTAL, CKMB, CKMBINDEX, TROPONINI in the last 168 hours. BNP (last 3 results) No results for input(s): PROBNP in the last 8760 hours. HbA1C: No results for input(s): HGBA1C in the last 72 hours. CBG: No results for input(s): GLUCAP in the last 168 hours. Lipid Profile: No results for input(s): CHOL, HDL, LDLCALC, TRIG, CHOLHDL, LDLDIRECT in the last 72 hours. Thyroid Function Tests: No results for input(s): TSH, T4TOTAL, FREET4, T3FREE, THYROIDAB in the last 72 hours. Anemia Panel: No results for input(s): VITAMINB12, FOLATE, FERRITIN, TIBC, IRON, RETICCTPCT in the last 72 hours. Sepsis Labs: No results for input(s): PROCALCITON, LATICACIDVEN in the last 168 hours.  Recent Results (from the past 240 hour(s))  Resp Panel by RT-PCR (Flu A&B, Covid) Nasopharyngeal Swab     Status: None    Collection Time: 07/17/20  8:33 PM   Specimen: Nasopharyngeal Swab; Nasopharyngeal(NP) swabs in vial transport medium  Result Value Ref Range Status   SARS Coronavirus 2 by RT PCR NEGATIVE NEGATIVE Final    Comment: (NOTE) SARS-CoV-2 target nucleic acids are NOT DETECTED.  The SARS-CoV-2 RNA is generally detectable in upper respiratory specimens during the acute phase of infection. The lowest concentration of SARS-CoV-2 viral copies this assay can detect is 138 copies/mL. A negative result does not preclude SARS-Cov-2 infection and should not be used as the sole basis for treatment or other patient management decisions. A negative result may occur with  improper specimen collection/handling, submission of specimen other than nasopharyngeal swab, presence of viral mutation(s) within the areas targeted by this assay, and inadequate number of  viral copies(<138 copies/mL). A negative result must be combined with clinical observations, patient history, and epidemiological information. The expected result is Negative.  Fact Sheet for Patients:  EntrepreneurPulse.com.au  Fact Sheet for Healthcare Providers:  IncredibleEmployment.be  This test is no t yet approved or cleared by the [place] FDA and  has been authorized for detection and/or diagnosis of SARS-CoV-2 by FDA under an Emergency Use Authorization (EUA). This EUA will remain  in effect (meaning this test can be used) for the duration of the COVID-19 declaration under Section 564(b)(1) of the Act, 21 U.S.C.section 360bbb-3(b)(1), unless the authorization is terminated  or revoked sooner.       Influenza A by PCR NEGATIVE NEGATIVE Final   Influenza B by PCR NEGATIVE NEGATIVE Final    Comment: (NOTE) The Xpert Xpress SARS-CoV-2/FLU/RSV plus assay is intended as an aid in the diagnosis of influenza from Nasopharyngeal swab specimens and should not be used as a sole basis for treatment.  Nasal washings and aspirates are unacceptable for Xpert Xpress SARS-CoV-2/FLU/RSV testing.  Fact Sheet for Patients: EntrepreneurPulse.com.au  Fact Sheet for Healthcare Providers: IncredibleEmployment.be  This test is not yet approved or cleared by the [place] FDA and has been authorized for detection and/or diagnosis of SARS-CoV-2 by FDA under an Emergency Use Authorization (EUA). This EUA will remain in effect (meaning this test can be used) for the duration of the COVID-19 declaration under Section 564(b)(1) of the Act, 21 U.S.C. section 360bbb-3(b)(1), unless the authorization is terminated or revoked.  Performed at [facility], [place] [address] [place], [place] [address]          Radiology Studies: No results found.      Scheduled Meds: . flecainide  100 mg Oral QPM  . flecainide  50 mg Oral q morning  . metoprolol succinate  50 mg Oral QHS  . pantoprazole  40 mg Oral BID AC  . simvastatin  40 mg Oral QHS   Continuous Infusions: . sodium chloride       LOS: 2 days   Time spent= 35 mins    Enoch Moffa Arsenio Loader, MD [facility]  If 7PM-7AM, please contact night-coverage  07/19/2020, 2:50 PM

## 2020-07-19 NOTE — Interval H&P Note (Signed)
History and Physical Interval Note: For colonoscopy today to evaluate painless rectal bleeding.  Tolerated colonoscopy prep without incident The nature of the procedure, as well as the risks, benefits, and alternatives were carefully and thoroughly reviewed with the patient. Ample time for discussion and questions allowed. The patient understood, was satisfied, and agreed to proceed.   CBC Latest Ref Rng & Units 07/19/2020 07/19/2020 07/18/2020  WBC 4.0 - 10.5 K/uL - 7.1 -  Hemoglobin 13.0 - 17.0 g/dL 10.4(L) 10.6(L) 11.3(L)  Hematocrit 39.0 - 52.0 % 31.1(L) 31.9(L) 34.1(L)  Platelets 150 - 400 K/uL - 149(L) -     07/19/2020 11:48 AM  Ronald [name]  has presented today for surgery, with the diagnosis of rectal bleeding.  The various methods of treatment have been discussed with the patient and family. After consideration of risks, benefits and other options for treatment, the patient has consented to  Procedure(s): COLONOSCOPY WITH PROPOFOL (N/A) as a surgical intervention.  The patient's history has been reviewed, patient examined, no change in status, stable for surgery.  I have reviewed the patient's chart and labs.  Questions were answered to the patient's satisfaction.     Lajuan Lines Jera Headings

## 2020-07-19 NOTE — Transfer of Care (Signed)
Immediate Anesthesia Transfer of Care Note  Patient: Ronald [name]  Procedure(s) Performed: COLONOSCOPY WITH PROPOFOL (N/A ) POLYPECTOMY  Patient Location: PACU  Anesthesia Type:MAC  Level of Consciousness: drowsy and patient cooperative  Airway & Oxygen Therapy: Patient Spontanous Breathing and Patient connected to face mask oxygen  Post-op Assessment: Report given to RN and Post -op Vital signs reviewed and stable  Post vital signs: Reviewed and stable  Last Vitals:  Vitals Value Taken Time  BP 115/59 07/19/20 1247  Temp 36.6 C 07/19/20 1245  Pulse 66 07/19/20 1247  Resp 18 07/19/20 1247  SpO2 100 % 07/19/20 1247  Vitals shown include unvalidated device data.  Last Pain:  Vitals:   07/19/20 1245  TempSrc: Oral  PainSc: 0-No pain      Patients Stated Pain Goal: 0 (37/09/64 3838)  Complications: No complications documented.

## 2020-07-20 LAB — MAGNESIUM: Magnesium: 1.8 mg/dL (ref 1.7–2.4)

## 2020-07-20 LAB — BASIC METABOLIC PANEL
Anion gap: 5 (ref 5–15)
BUN: 22 mg/dL (ref 8–23)
CO2: 25 mmol/L (ref 22–32)
Calcium: 8 mg/dL — ABNORMAL LOW (ref 8.9–10.3)
Chloride: 108 mmol/L (ref 98–111)
Creatinine, Ser: 1.35 mg/dL — ABNORMAL HIGH (ref 0.61–1.24)
GFR, Estimated: 52 mL/min — ABNORMAL LOW (ref >60)
Glucose, Bld: 95 mg/dL (ref 70–99)
Potassium: 3.7 mmol/L (ref 3.5–5.1)
Sodium: 138 mmol/L (ref 135–145)

## 2020-07-20 LAB — CBC
HCT: 32.4 % — ABNORMAL LOW (ref 39.0–52.0)
Hemoglobin: 10.9 g/dL — ABNORMAL LOW (ref 13.0–17.0)
MCH: 31.2 pg (ref 26.0–34.0)
MCHC: 33.6 g/dL (ref 30.0–36.0)
MCV: 92.8 fL (ref 80.0–100.0)
Platelets: 155 10*3/uL (ref 150–400)
RBC: 3.49 MIL/uL — ABNORMAL LOW (ref 4.22–5.81)
RDW: 13.3 % (ref 11.5–15.5)
WBC: 9 10*3/uL (ref 4.0–10.5)
nRBC: 0 % (ref 0.0–0.2)

## 2020-07-20 LAB — HEMOGLOBIN AND HEMATOCRIT, BLOOD
HCT: 29.9 % — ABNORMAL LOW (ref 39.0–52.0)
Hemoglobin: 10.1 g/dL — ABNORMAL LOW (ref 13.0–17.0)

## 2020-07-20 LAB — SURGICAL PATHOLOGY

## 2020-07-20 MED ORDER — DOCUSATE SODIUM 100 MG PO CAPS: 100.0000 mg | ORAL_CAPSULE | Freq: Two times a day (BID) | ORAL | 0 refills | Status: AC

## 2020-07-20 NOTE — Discharge Summary (Signed)
Physician Discharge Summary  JARL SAGAL [id number] DOB: [birth year] DOA: 07/17/2020  PCP: Colon Branch, MD  Admit date: 07/17/2020 Discharge date: 07/20/2020  Admitted From: Home Disposition: Home  Recommendations for Outpatient Follow-up:  1. Follow up with PCP in 1-2 weeks 2. Please obtain BMP/CBC in one week your next doctors visit.  3. Resume Eliquis   Discharge Condition: Stable CODE STATUS: Full code Diet recommendation: Heart healthy  Brief/Interim Summary: 86 year old with history of atrial fibrillation, HTN, GERD comes to the hospital with few episodes of bright red blood per rectum.  Initial hemoglobin was noted to be around 15 the following day dropped down to 12.5.  Eliquis was held, GI team was consulted with plans for colonoscopy.  Colonoscopy performed on 5/12 showed multiple polyps which were removed, small internal hemorrhoids and diverticulosis.  No obvious evidence of active bleeding but likely the source was diverticulosis for his bleeding.  Patient's Eliquis was resumed, no further signs of bleeding therefore stable for discharge with outpatient follow-up.   Assessment & Plan:   Active Problems:   Essential hypertension   Calculus of kidney   A-fib (HCC)   Acute lower GI bleeding   AKI (acute kidney injury) ([place])   Chronic anticoagulation   Diverticulosis of colon with hemorrhage  Acute lower GI bleed, bright red blood per rectum History of multiple gastric polyps, GIST tumor, pancreatic cyst - Patient has not had colonoscopy in about 9 years. - Status post C-scope 5/12- multiple polyps removed, small internal hemorrhoids and diverticulosis.  No active bleeding. - Hemoglobin is stable around 10.5.  Stable for discharge.  No further bleeding, okay to resume Eliquis per GI today. - Continue twice daily PPI  Acute kidney injury, now stable - Baseline creatinine 1.1, admission creatinine 1.5.    Creatinine stable around 1.3  Essential hypertension -  Metoprolol 50 mg daily.  Continue flecainide  History of paroxysmal atrial fibrillation - Patient is on flecainide.    Resume Eliquis  Hyperlipidemia - Zocor    There is no height or weight on file to calculate BMI.         Discharge Diagnoses:  Active Problems:   Essential hypertension   Calculus of kidney   A-fib (HCC)   Acute lower GI bleeding   AKI (acute kidney injury) ([place])   Chronic anticoagulation   Diverticulosis of colon with hemorrhage      Consultations:  Gastroenterology  Subjective: Feels great wanting to go home today.  No complaints  Discharge Exam: Vitals:   07/20/20 0526 07/20/20 0543  BP: (!) 163/77 (!) 157/83  Pulse: (!) 59   Resp: 20   Temp: 97.8 F (36.6 C)   SpO2: 95%    Vitals:   07/19/20 1446 07/19/20 2020 07/20/20 0526 07/20/20 0543  BP: (!) 147/91 (!) 142/77 (!) 163/77 (!) 157/83  Pulse: 65 79 (!) 59   Resp: 20 18 20    Temp: 98.5 F (36.9 C) 98.4 F (36.9 C) 97.8 F (36.6 C)   TempSrc: Oral Oral Oral   SpO2: 94% 96% 95%     General: Pt is alert, awake, not in acute distress Cardiovascular: RRR, S1/S2 +, no rubs, no gallops Respiratory: CTA bilaterally, no wheezing, no rhonchi Abdominal: Soft, NT, ND, bowel sounds + Extremities: no edema, no cyanosis  Discharge Instructions   Allergies as of 07/20/2020      Reactions   Cucumber Extract Nausea And Vomiting   Sulfonamide Derivatives Other (See Comments)   Unknown reaction  Medication List    TAKE these medications   apixaban 5 MG Tabs tablet Commonly known as: Eliquis Take 1 tablet (5 mg total) by mouth 2 (two) times daily.   cholecalciferol 1000 units tablet Commonly known as: VITAMIN D Take 1,000 Units by mouth every evening.   docusate sodium 100 MG capsule Commonly known as: Colace Take 1 capsule (100 mg total) by mouth 2 (two) times daily.   ferrous sulfate 325 (65 FE) MG tablet Take 325 mg by mouth daily at 12 noon.   flecainide 100 MG  tablet Commonly known as: TAMBOCOR Take 1 tablet (100 mg total) by mouth every evening.   flecainide 50 MG tablet Commonly known as: TAMBOCOR TAKE 1 TABLET BY MOUTH ONCE DAILY IN THE MORNING   metoprolol succinate 50 MG 24 hr tablet Commonly known as: TOPROL-XL Take 1 tablet by mouth once daily   multivitamin with minerals Tabs tablet Take 1 tablet by mouth daily at 12 noon.   NIFEdipine 60 MG 24 hr tablet Commonly known as: PROCARDIA XL/NIFEDICAL XL Take 1 tablet (60 mg total) by mouth daily.   omeprazole 40 MG capsule Commonly known as: PRILOSEC Take 1 capsule (40 mg total) by mouth 2 (two) times daily before a meal.   PRESERVISION AREDS 2 PO Take 1 tablet by mouth every evening.   Saw [place] 450 MG Caps Take 450 mg by mouth every evening.   simvastatin 40 MG tablet Commonly known as: ZOCOR Take 1 tablet (40 mg total) by mouth at bedtime.       Follow-up Information    Colon Branch, MD. Schedule an appointment as soon as possible for a visit in 1 week(s).   Specialty: Internal Medicine Contact information: [address] [place] [address] [phone]        Deboraha Sprang, MD .   Specialty: Cardiology Contact information: [address] [place] [address] [phone]              Allergies  Allergen Reactions  . Cucumber Extract Nausea And Vomiting  . Sulfonamide Derivatives Other (See Comments)    Unknown reaction    You were cared for by a hospitalist during your hospital stay. If you have any questions about your discharge medications or the care you received while you were in the hospital after you are discharged, you can call the unit and asked to speak with the hospitalist on call if the hospitalist that took care of you is not available. Once you are discharged, your primary care physician will handle any further medical issues. Please note that no refills for any discharge medications will be  authorized once you are discharged, as it is imperative that you return to your primary care physician (or establish a relationship with a primary care physician if you do not have one) for your aftercare needs so that they can reassess your need for medications and monitor your lab values.   Procedures/Studies:  No results found.   The results of significant diagnostics from this hospitalization (including imaging, microbiology, ancillary and laboratory) are listed below for reference.     Microbiology: Recent Results (from the past 240 hour(s))  Resp Panel by RT-PCR (Flu A&B, Covid) Nasopharyngeal Swab     Status: None   Collection Time: 07/17/20  8:33 PM   Specimen: Nasopharyngeal Swab; Nasopharyngeal(NP) swabs in vial transport medium  Result Value Ref Range Status   SARS Coronavirus 2 by RT PCR  NEGATIVE NEGATIVE Final    Comment: (NOTE) SARS-CoV-2 target nucleic acids are NOT DETECTED.  The SARS-CoV-2 RNA is generally detectable in upper respiratory specimens during the acute phase of infection. The lowest concentration of SARS-CoV-2 viral copies this assay can detect is 138 copies/mL. A negative result does not preclude SARS-Cov-2 infection and should not be used as the sole basis for treatment or other patient management decisions. A negative result may occur with  improper specimen collection/handling, submission of specimen other than nasopharyngeal swab, presence of viral mutation(s) within the areas targeted by this assay, and inadequate number of viral copies(<138 copies/mL). A negative result must be combined with clinical observations, patient history, and epidemiological information. The expected result is Negative.  Fact Sheet for Patients:  EntrepreneurPulse.com.au  Fact Sheet for Healthcare Providers:  IncredibleEmployment.be  This test is no t yet approved or cleared by the [place] FDA and  has been authorized for  detection and/or diagnosis of SARS-CoV-2 by FDA under an Emergency Use Authorization (EUA). This EUA will remain  in effect (meaning this test can be used) for the duration of the COVID-19 declaration under Section 564(b)(1) of the Act, 21 U.S.C.section 360bbb-3(b)(1), unless the authorization is terminated  or revoked sooner.       Influenza A by PCR NEGATIVE NEGATIVE Final   Influenza B by PCR NEGATIVE NEGATIVE Final    Comment: (NOTE) The Xpert Xpress SARS-CoV-2/FLU/RSV plus assay is intended as an aid in the diagnosis of influenza from Nasopharyngeal swab specimens and should not be used as a sole basis for treatment. Nasal washings and aspirates are unacceptable for Xpert Xpress SARS-CoV-2/FLU/RSV testing.  Fact Sheet for Patients: EntrepreneurPulse.com.au  Fact Sheet for Healthcare Providers: IncredibleEmployment.be  This test is not yet approved or cleared by the [place] FDA and has been authorized for detection and/or diagnosis of SARS-CoV-2 by FDA under an Emergency Use Authorization (EUA). This EUA will remain in effect (meaning this test can be used) for the duration of the COVID-19 declaration under Section 564(b)(1) of the Act, 21 U.S.C. section 360bbb-3(b)(1), unless the authorization is terminated or revoked.  Performed at [facility], [place] [address], [place], [place] [address]      Labs: BNP (last 3 results) Recent Labs    02/10/20 1945  BNP 60.7   Basic Metabolic Panel: Recent Labs  Lab 07/17/20 2033 07/18/20 0038 07/19/20 0032 07/20/20 0035  NA 138 138 140 138  K 4.9 4.0 3.7 3.7  CL 104 104 109 108  CO2 26 27 27 25   GLUCOSE 128* 150* 94 95  BUN 28* 29* 33* 22  CREATININE 1.56* 1.50* 1.52* 1.35*  CALCIUM 8.6* 8.3* 8.0* 8.0*  MG  --   --  1.9 1.8   Liver Function Tests: Recent Labs  Lab 07/17/20 2033  AST 29  ALT 18  ALKPHOS 59  BILITOT 1.4*  PROT 6.6  ALBUMIN 3.4*   No  results for input(s): LIPASE, AMYLASE in the last 168 hours. No results for input(s): AMMONIA in the last 168 hours. CBC: Recent Labs  Lab 07/17/20 2033 07/18/20 0038 07/18/20 0355 07/19/20 0032 07/19/20 0647 07/19/20 1359 07/19/20 1739 07/20/20 0035 07/20/20 0613  WBC 13.1* 9.4  --  7.1  --   --   --  9.0  --   NEUTROABS 10.6*  --   --   --   --   --   --   --   --   HGB 15.3 13.2   < >  10.6* 10.4* 10.2* 11.1* 10.9* 10.1*  HCT 46.3 39.6   < > 31.9* 31.1* 31.4* 33.2* 32.4* 29.9*  MCV 92.6 93.0  --  93.3  --   --   --  92.8  --   PLT 214 189  --  149*  --   --   --  155  --    < > = values in this interval not displayed.   Cardiac Enzymes: No results for input(s): CKTOTAL, CKMB, CKMBINDEX, TROPONINI in the last 168 hours. BNP: Invalid input(s): POCBNP CBG: No results for input(s): GLUCAP in the last 168 hours. D-Dimer No results for input(s): DDIMER in the last 72 hours. Hgb A1c No results for input(s): HGBA1C in the last 72 hours. Lipid Profile No results for input(s): CHOL, HDL, LDLCALC, TRIG, CHOLHDL, LDLDIRECT in the last 72 hours. Thyroid function studies No results for input(s): TSH, T4TOTAL, T3FREE, THYROIDAB in the last 72 hours.  Invalid input(s): FREET3 Anemia work up No results for input(s): VITAMINB12, FOLATE, FERRITIN, TIBC, IRON, RETICCTPCT in the last 72 hours. Urinalysis    Component Value Date/Time   COLORURINE YELLOW 02/10/2020 1953   APPEARANCEUR CLEAR 02/10/2020 1953   LABSPEC 1.009 02/10/2020 1953   PHURINE 6.0 02/10/2020 1953   GLUCOSEU NEGATIVE 02/10/2020 1953   GLUCOSEU NEGATIVE 05/13/2017 1038   HGBUR MODERATE (A) 02/10/2020 1953   BILIRUBINUR NEGATIVE 02/10/2020 1953   KETONESUR NEGATIVE 02/10/2020 1953   PROTEINUR NEGATIVE 02/10/2020 1953   UROBILINOGEN 0.2 05/13/2017 1038   NITRITE NEGATIVE 02/10/2020 1953   LEUKOCYTESUR SMALL (A) 02/10/2020 1953   Sepsis Labs Invalid input(s): PROCALCITONIN,  WBC,  LACTICIDVEN Microbiology Recent  Results (from the past 240 hour(s))  Resp Panel by RT-PCR (Flu A&B, Covid) Nasopharyngeal Swab     Status: None   Collection Time: 07/17/20  8:33 PM   Specimen: Nasopharyngeal Swab; Nasopharyngeal(NP) swabs in vial transport medium  Result Value Ref Range Status   SARS Coronavirus 2 by RT PCR NEGATIVE NEGATIVE Final    Comment: (NOTE) SARS-CoV-2 target nucleic acids are NOT DETECTED.  The SARS-CoV-2 RNA is generally detectable in upper respiratory specimens during the acute phase of infection. The lowest concentration of SARS-CoV-2 viral copies this assay can detect is 138 copies/mL. A negative result does not preclude SARS-Cov-2 infection and should not be used as the sole basis for treatment or other patient management decisions. A negative result may occur with  improper specimen collection/handling, submission of specimen other than nasopharyngeal swab, presence of viral mutation(s) within the areas targeted by this assay, and inadequate number of viral copies(<138 copies/mL). A negative result must be combined with clinical observations, patient history, and epidemiological information. The expected result is Negative.  Fact Sheet for Patients:  EntrepreneurPulse.com.au  Fact Sheet for Healthcare Providers:  IncredibleEmployment.be  This test is no t yet approved or cleared by the [place] FDA and  has been authorized for detection and/or diagnosis of SARS-CoV-2 by FDA under an Emergency Use Authorization (EUA). This EUA will remain  in effect (meaning this test can be used) for the duration of the COVID-19 declaration under Section 564(b)(1) of the Act, 21 U.S.C.section 360bbb-3(b)(1), unless the authorization is terminated  or revoked sooner.       Influenza A by PCR NEGATIVE NEGATIVE Final   Influenza B by PCR NEGATIVE NEGATIVE Final    Comment: (NOTE) The Xpert Xpress SARS-CoV-2/FLU/RSV plus assay is intended as an aid in the  diagnosis of influenza from Nasopharyngeal swab specimens and should  not be used as a sole basis for treatment. Nasal washings and aspirates are unacceptable for Xpert Xpress SARS-CoV-2/FLU/RSV testing.  Fact Sheet for Patients: EntrepreneurPulse.com.au  Fact Sheet for Healthcare Providers: IncredibleEmployment.be  This test is not yet approved or cleared by the [place] FDA and has been authorized for detection and/or diagnosis of SARS-CoV-2 by FDA under an Emergency Use Authorization (EUA). This EUA will remain in effect (meaning this test can be used) for the duration of the COVID-19 declaration under Section 564(b)(1) of the Act, 21 U.S.C. section 360bbb-3(b)(1), unless the authorization is terminated or revoked.  Performed at [facility], [place] [address], [place], [place] [address]      Time coordinating discharge:  I have spent 35 minutes face to face with the patient and on the ward discussing the patients care, assessment, plan and disposition with other care givers. >50% of the time was devoted counseling the patient about the risks and benefits of treatment/Discharge disposition and coordinating care.   SIGNED:   Damita Lack, MD  [facility] 07/20/2020, 12:11 PM   If 7PM-7AM, please contact night-coverage

## 2020-07-20 NOTE — Plan of Care (Signed)
Problem: Education: Goal: Knowledge of General Education information will improve Description Including pain rating scale, medication(s)/side effects and non-pharmacologic comfort measures Outcome: Progressing   Problem: Health Behavior/Discharge Planning: Goal: Ability to manage health-related needs will improve Outcome: Progressing   Problem: Clinical Measurements: Goal: Respiratory complications will improve Outcome: Progressing   Problem: Activity: Goal: Risk for activity intolerance will decrease Outcome: Progressing   Problem: Nutrition: Goal: Adequate nutrition will be maintained Outcome: Progressing   Problem: Coping: Goal: Level of anxiety will decrease Outcome: Progressing   Problem: Elimination: Goal: Will not experience complications related to bowel motility Outcome: Progressing Goal: Will not experience complications related to urinary retention Outcome: Progressing   Problem: Pain Managment: Goal: General experience of comfort will improve Outcome: Progressing   Problem: Safety: Goal: Ability to remain free from injury will improve Outcome: Progressing   Problem: Skin Integrity: Goal: Risk for impaired skin integrity will decrease Outcome: Progressing   

## 2020-07-23 ENCOUNTER — Encounter (HOSPITAL_COMMUNITY): Payer: Self-pay | Admitting: Internal Medicine

## 2020-07-24 ENCOUNTER — Telehealth: Payer: Self-pay

## 2020-07-24 NOTE — Telephone Encounter (Signed)
Transition Care Management Follow-up Telephone Call  Date of discharge and from where: 07/20/2020-[facility]  How have you been since you were released from the hospital? Doing fine. Feeling much better  Any questions or concerns? No  Items Reviewed:  Did the pt receive and understand the discharge instructions provided? Yes   Medications obtained and verified? Yes   Other? Yes   Any new allergies since your discharge? No   Dietary orders reviewed? Yes  Do you have support at home? No but states his girlfriend  Is just minutes away.  Home Care and Equipment/Supplies: Were home health services ordered? no If so, what is the name of the agency? n/a  Has the agency set up a time to come to the patient's home? not applicable Were any new equipment or medical supplies ordered?  No What is the name of the medical supply agency? n/a Were you able to get the supplies/equipment? not applicable Do you have any questions related to the use of the equipment or supplies? n/a  Functional Questionnaire: (I = Independent and D = Dependent) ADLs: I  Bathing/Dressing- I  Meal Prep- I  Eating- I  Maintaining continence- I  Transferring/Ambulation- I  Managing Meds- I  Follow up appointments reviewed:   PCP Hospital f/u appt confirmed? Yes  Scheduled to see Dr. Larose Kells on 08/01/2020 @ 11:20.  [facility] f/u appt confirmed? Yes  Scheduled to see Dr. Caryl Comes on 09/06/20 @ 3:15.  Are transportation arrangements needed? No   If their condition worsens, is the pt aware to call PCP or go to the Emergency Dept.? Yes  Was the patient provided with contact information for the PCP's office or ED? Yes  Was to pt encouraged to call back with questions or concerns? Yes

## 2020-07-25 ENCOUNTER — Encounter: Payer: Self-pay | Admitting: Internal Medicine

## 2020-08-01 ENCOUNTER — Ambulatory Visit (INDEPENDENT_AMBULATORY_CARE_PROVIDER_SITE_OTHER): Payer: PPO | Admitting: Internal Medicine

## 2020-08-01 ENCOUNTER — Other Ambulatory Visit: Payer: Self-pay

## 2020-08-01 VITALS — BP 144/83 | HR 63 | Temp 97.1°F | Ht 68.0 in | Wt 207.0 lb

## 2020-08-01 DIAGNOSIS — R7989 Other specified abnormal findings of blood chemistry: Secondary | ICD-10-CM

## 2020-08-01 DIAGNOSIS — R0609 Other forms of dyspnea: Secondary | ICD-10-CM

## 2020-08-01 DIAGNOSIS — K922 Gastrointestinal hemorrhage, unspecified: Secondary | ICD-10-CM

## 2020-08-01 DIAGNOSIS — R0600 Dyspnea, unspecified: Secondary | ICD-10-CM

## 2020-08-01 DIAGNOSIS — R739 Hyperglycemia, unspecified: Secondary | ICD-10-CM | POA: Diagnosis not present

## 2020-08-01 LAB — HEMOGLOBIN A1C: Hgb A1c MFr Bld: 5.8 % (ref 4.6–6.5)

## 2020-08-01 LAB — CBC WITH DIFFERENTIAL/PLATELET
Basophils Absolute: 0 10*3/uL (ref 0.0–0.1)
Basophils Relative: 0.5 % (ref 0.0–3.0)
Eosinophils Absolute: 0.2 10*3/uL (ref 0.0–0.7)
Eosinophils Relative: 2 % (ref 0.0–5.0)
HCT: 39.1 % (ref 39.0–52.0)
Hemoglobin: 12.9 g/dL — ABNORMAL LOW (ref 13.0–17.0)
Lymphocytes Relative: 23.1 % (ref 12.0–46.0)
Lymphs Abs: 1.8 10*3/uL (ref 0.7–4.0)
MCHC: 33.1 g/dL (ref 30.0–36.0)
MCV: 92.3 fl (ref 78.0–100.0)
Monocytes Absolute: 0.5 10*3/uL (ref 0.1–1.0)
Monocytes Relative: 6.2 % (ref 3.0–12.0)
Neutro Abs: 5.3 10*3/uL (ref 1.4–7.7)
Neutrophils Relative %: 68.2 % (ref 43.0–77.0)
Platelets: 264 10*3/uL (ref 150.0–400.0)
RBC: 4.23 Mil/uL (ref 4.22–5.81)
RDW: 14.4 % (ref 11.5–15.5)
WBC: 7.8 10*3/uL (ref 4.0–10.5)

## 2020-08-01 LAB — BASIC METABOLIC PANEL
BUN: 23 mg/dL (ref 6–23)
CO2: 31 mEq/L (ref 19–32)
Calcium: 9 mg/dL (ref 8.4–10.5)
Chloride: 103 mEq/L (ref 96–112)
Creatinine, Ser: 1.16 mg/dL (ref 0.40–1.50)
GFR: 58.06 mL/min — ABNORMAL LOW (ref >60.00)
Glucose, Bld: 104 mg/dL — ABNORMAL HIGH (ref 70–99)
Potassium: 4.2 mEq/L (ref 3.5–5.1)
Sodium: 141 mEq/L (ref 135–145)

## 2020-08-01 NOTE — Patient Instructions (Signed)
Please go to lab and provide a blood sample  Your next visit with me is 10/08/2020  Call anytime if you have problems

## 2020-08-01 NOTE — Progress Notes (Signed)
Subjective:    Patient ID: Ronald [name], male    DOB: 03/12/1934, 86 y.o.   MRN: [id number]  DOS:  08/01/2020 Type of visit - description: Hospital follow-up Was admitted to the hospital 07/17/2020. He presented to the ER with 3 episodes of bright red blood per rectum. There was no stomach or rectal pain. He was anticoagulated due to atrial fibrillation. GI consulted, they felt that he possibly had a diverticular bleed and subsequently had a colonoscopy. After that, patient was hemodynamically stable, was recommended to go back on Eliquis and sent home.   Review of Systems Today he reports he is doing well. No fever chills No chest pain no difficulty breathing at rest. He did notice that since he left the hospital when he goes upstairs he feels a slightly short of breath compared to previous weeks. No nausea vomiting.  No diarrhea.  No further blood in the stools No abdominal pain. He does have chronic constipation.   Past Medical History:  Diagnosis Date  . Arthritis    one finger  . BPH (benign prostatic hypertrophy)    f/u @ Biopsy, s/p bx 2009 apro (-)  . Cataract   . Colon polyp   . Complication of anesthesia    violent upon waking up (only 1 time)   . Diverticulosis   . Dysrhythmia    A-flutter/Afib  . Early cataract    left  . Edema leg    asymetric , L > R , [place] neg for DVT 09-2010  . Gastric polyps   . GERD (gastroesophageal reflux disease)   . Hemorrhoids   . History of kidney stones   . Hypercalcemia    parathyroidectomy 04/2008  . Hyperlipidemia   . Hypertension   . Iron deficiency anemia   . Polyp, stomach   . Pre-diabetes   . Wears dentures   . Wears glasses     Past Surgical History:  Procedure Laterality Date  . A-FLUTTER ABLATION N/A 08/12/2017   Procedure: A-FLUTTER ABLATION;  Surgeon: Deboraha Sprang, MD;  Location: [facility];  Service: Cardiovascular;  Laterality: N/A;  . APPENDECTOMY    . BIOPSY  09/20/2018   Procedure: BIOPSY;   Surgeon: Rush Landmark Telford Nab., MD;  Location: [place];  Service: Gastroenterology;;  . CATARACT EXTRACTION Right   . COLONOSCOPY  12/16/2011   Procedure: COLONOSCOPY;  Surgeon: Inda Castle, MD;  Location: [facility];  Service: Endoscopy;  Laterality: N/A;  . COLONOSCOPY WITH PROPOFOL N/A 07/19/2020   Procedure: COLONOSCOPY WITH PROPOFOL;  Surgeon: Jerene Bears, MD;  Location: [facility];  Service: Gastroenterology;  Laterality: N/A;  . ENDOSCOPIC MUCOSAL RESECTION N/A 09/20/2018   Procedure: ENDOSCOPIC MUCOSAL RESECTION;  Surgeon: Rush Landmark Telford Nab., MD;  Location: [place];  Service: Gastroenterology;  Laterality: N/A;  . ENDOSCOPIC MUCOSAL RESECTION  01/31/2019   Procedure: ENDOSCOPIC MUCOSAL RESECTION;  Surgeon: Rush Landmark Telford Nab., MD;  Location: [facility];  Service: Gastroenterology;;  . ESOPHAGOGASTRODUODENOSCOPY N/A 01/17/2013   Procedure: ESOPHAGOGASTRODUODENOSCOPY (EGD);  Surgeon: Inda Castle, MD;  Location: [facility];  Service: Endoscopy;  Laterality: N/A;  . ESOPHAGOGASTRODUODENOSCOPY (EGD) WITH PROPOFOL N/A 09/20/2018   Procedure: ESOPHAGOGASTRODUODENOSCOPY (EGD) WITH PROPOFOL;  Surgeon: Rush Landmark Telford Nab., MD;  Location: [place];  Service: Gastroenterology;  Laterality: N/A;  . ESOPHAGOGASTRODUODENOSCOPY (EGD) WITH PROPOFOL N/A 01/31/2019   Procedure: ESOPHAGOGASTRODUODENOSCOPY (EGD) WITH PROPOFOL;  Surgeon: Rush Landmark Telford Nab., MD;  Location: [place];  Service: Gastroenterology;  Laterality: N/A;  . ESOPHAGOGASTRODUODENOSCOPY (EGD)  WITH PROPOFOL N/A 04/04/2020   Procedure: ESOPHAGOGASTRODUODENOSCOPY (EGD) WITH PROPOFOL;  Surgeon: Rush Landmark Telford Nab., MD;  Location: [facility];  Service: Gastroenterology;  Laterality: N/A;  . EUS N/A 01/31/2019   Procedure: UPPER ENDOSCOPIC ULTRASOUND (EUS) RADIAL;  Surgeon: Rush Landmark Telford Nab., MD;  Location: [place];  Service: Gastroenterology;  Laterality: N/A;  . EUS N/A  04/04/2020   Procedure: UPPER ENDOSCOPIC ULTRASOUND (EUS) RADIAL;  Surgeon: Rush Landmark Telford Nab., MD;  Location: [facility];  Service: Gastroenterology;  Laterality: N/A;  . EXTERNAL EAR SURGERY  01/2016   BCC removal  . EYE SURGERY  infant   "lazy eye"  . FINE NEEDLE ASPIRATION  01/31/2019   Procedure: FINE NEEDLE ASPIRATION (FNA) LINEAR;  Surgeon: Irving Copas., MD;  Location: [facility];  Service: Gastroenterology;;  . Otho Darner SIGMOIDOSCOPY  02/09/2012   Procedure: FLEXIBLE SIGMOIDOSCOPY;  Surgeon: Inda Castle, MD;  Location: [facility];  Service: Endoscopy;  Laterality: N/A;  . HEMORRHOID BANDING  02/09/2012   Procedure: HEMORRHOID BANDING;  Surgeon: Inda Castle, MD;  Location: [facility];  Service: Endoscopy;  Laterality: N/A;  . HEMOSTASIS CLIP PLACEMENT  09/20/2018   Procedure: HEMOSTASIS CLIP PLACEMENT;  Surgeon: Irving Copas., MD;  Location: [place];  Service: Gastroenterology;;  . HEMOSTASIS CLIP PLACEMENT  01/31/2019   Procedure: HEMOSTASIS CLIP PLACEMENT;  Surgeon: Irving Copas., MD;  Location: [place];  Service: Gastroenterology;;  . HEMOSTASIS CLIP PLACEMENT  04/04/2020   Procedure: HEMOSTASIS CLIP PLACEMENT;  Surgeon: Irving Copas., MD;  Location: [facility];  Service: Gastroenterology;;  . HEMOSTASIS CONTROL  04/04/2020   Procedure: HEMOSTASIS CONTROL;  Surgeon: Irving Copas., MD;  Location: [facility];  Service: Gastroenterology;;  snare cautery  . HOT HEMOSTASIS N/A 01/17/2013   Procedure: HOT HEMOSTASIS (ARGON PLASMA COAGULATION/BICAP);  Surgeon: Inda Castle, MD;  Location: [facility];  Service: Endoscopy;  Laterality: N/A;  . KIDNEY STONE SURGERY    . MULTIPLE TOOTH EXTRACTIONS    . PARATHYROIDECTOMY  04/2008  . POLYPECTOMY  09/20/2018   Procedure: POLYPECTOMY;  Surgeon: Mansouraty, Telford Nab., MD;  Location: [place];  Service: Gastroenterology;;  . POLYPECTOMY  01/31/2019    Procedure: POLYPECTOMY;  Surgeon: Irving Copas., MD;  Location: [place];  Service: Gastroenterology;;  . POLYPECTOMY  04/04/2020   Procedure: POLYPECTOMY;  Surgeon: Irving Copas., MD;  Location: [facility];  Service: Gastroenterology;;  . POLYPECTOMY  07/19/2020   Procedure: POLYPECTOMY;  Surgeon: Jerene Bears, MD;  Location: [facility];  Service: Gastroenterology;;  . Clide Deutscher  09/20/2018   Procedure: Clide Deutscher;  Surgeon: Irving Copas., MD;  Location: [place];  Service: Gastroenterology;;  . Clide Deutscher  01/31/2019   Procedure: Clide Deutscher;  Surgeon: Irving Copas., MD;  Location: [place];  Service: Gastroenterology;;  . Lia Foyer INJECTION  04/04/2020   Procedure: SUBMUCOSAL INJECTION;  Surgeon: Irving Copas., MD;  Location: [facility];  Service: Gastroenterology;;  EPI Injection  . TONSILLECTOMY      Allergies as of 08/01/2020      Reactions   Cucumber Extract Nausea And Vomiting   Sulfonamide Derivatives Other (See Comments)   Unknown reaction      Medication List       Accurate as of Aug 01, 2020 11:59 PM. If you have any questions, ask your nurse or doctor.        apixaban 5 MG Tabs tablet Commonly known as: Eliquis Take 1 tablet (5 mg total) by mouth 2 (two) times daily.  cholecalciferol 1000 units tablet Commonly known as: VITAMIN D Take 1,000 Units by mouth every evening.   docusate sodium 100 MG capsule Commonly known as: Colace Take 1 capsule (100 mg total) by mouth 2 (two) times daily.   ferrous sulfate 325 (65 FE) MG tablet Take 325 mg by mouth daily at 12 noon.   flecainide 100 MG tablet Commonly known as: TAMBOCOR Take 1 tablet (100 mg total) by mouth every evening.   flecainide 50 MG tablet Commonly known as: TAMBOCOR TAKE 1 TABLET BY MOUTH ONCE DAILY IN THE MORNING   metoprolol succinate 50 MG 24 hr tablet Commonly known as: TOPROL-XL Take 1 tablet by mouth once  daily   multivitamin with minerals Tabs tablet Take 1 tablet by mouth daily at 12 noon.   NIFEdipine 60 MG 24 hr tablet Commonly known as: PROCARDIA XL/NIFEDICAL XL Take 1 tablet (60 mg total) by mouth daily.   omeprazole 40 MG capsule Commonly known as: PRILOSEC Take 1 capsule (40 mg total) by mouth 2 (two) times daily before a meal.   PRESERVISION AREDS 2 PO Take 1 tablet by mouth every evening.   Saw Palmetto 450 MG Caps Take 450 mg by mouth every evening.   simvastatin 40 MG tablet Commonly known as: ZOCOR Take 1 tablet (40 mg total) by mouth at bedtime.          Objective:   Physical Exam BP (!) 144/83 (BP Location: Left Arm, Patient Position: Sitting, Cuff Size: Large)   Pulse 63   Temp (!) 97.1 F (36.2 C) (Temporal)   Ht 5\' 8"  (1.727 m)   Wt 207 lb (93.9 kg)   SpO2 97%   BMI 31.47 kg/m  General:   Well developed, NAD, BMI noted.  HEENT:  Normocephalic . Face symmetric, atraumatic Lungs:  CTA B Normal respiratory effort, no intercostal retractions, no accessory muscle use. Heart: RRR,  no murmur.  Abdomen:  Not distended, soft, non-tender. No rebound or rigidity.   Skin: Not pale. Not jaundice Lower extremities: no pretibial edema bilaterally  Neurologic:  alert & oriented X3.  Speech normal, gait appropriate for age and unassisted Psych--  Cognition and judgment appear intact.  Cooperative with normal attention span and concentration.  Behavior appropriate. No anxious or depressed appearing.     Assessment    Assessment   DM HTN Hyperlipidemia Osteopenia T score -2.0 (2010), T score -1.6 (12-2014); T score -1.3 (03-2018). 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  PLAN  TCM 14 GI bleed: Admitted to the hospital earlier this month, had an acute lower GI bleed, colonoscopy showed multiple polyps, no active bleeding at the time of the procedure, apparently he had a diverticular bleed, was released home with a stable hemoglobin of around 10.5. Was okay  to resume Eliquis. Since then he is asymptomatic.  Check a CBC. If he has more symptoms, rec to reach out  immediately to GI or me. Increased creatinine: During the hospital admission creatinine was somewhat elevated, recheck a BMP. DOE: Since he left the hospital he has noted some DOE going up stairs, no chest pain, no palpitation, he seems to be on sinus rhythm today.  EKG at the hospital was NSR.  Suspect DOE related to acute anemia.  Advised patient that if he is not recovering soon he will let me know. Hyperglycemia: Diet controlled, check A1c. Preventive care: Had COVID-vaccine x4. He provided today his POA documents. RTC already scheduled for August.   This visit occurred during the SARS-CoV-2 public health emergency.  Safety protocols were in place, including screening questions prior to the visit, additional usage of staff PPE, and extensive cleaning of exam room while observing appropriate contact time as indicated for disinfecting solutions.

## 2020-08-02 NOTE — Assessment & Plan Note (Signed)
TCM 14 GI bleed: Admitted to the hospital earlier this month, had an acute lower GI bleed, colonoscopy showed multiple polyps, no active bleeding at the time of the procedure, apparently he had a diverticular bleed, was released home with a stable hemoglobin of around 10.5. Was okay to resume Eliquis. Since then he is asymptomatic.  Check a CBC. If he has more symptoms, rec to reach out  immediately to GI or me. Increased creatinine: During the hospital admission creatinine was somewhat elevated, recheck a BMP. DOE: Since he left the hospital he has noted some DOE going up stairs, no chest pain, no palpitation, he seems to be on sinus rhythm today.  EKG at the hospital was NSR.  Suspect DOE related to acute anemia.  Advised patient that if he is not recovering soon he will let me know. Hyperglycemia: Diet controlled, check A1c. Preventive care: Had COVID-vaccine x4. He provided today his POA documents. RTC already scheduled for August.

## 2020-08-26 ENCOUNTER — Other Ambulatory Visit: Payer: Self-pay | Admitting: Internal Medicine

## 2020-08-28 MED ORDER — FLECAINIDE ACETATE 100 MG PO TABS: 100.0000 mg | ORAL_TABLET | Freq: Every evening | ORAL | 1 refills | Status: DC

## 2020-09-06 ENCOUNTER — Encounter: Payer: Self-pay | Admitting: Internal Medicine

## 2020-09-06 ENCOUNTER — Ambulatory Visit: Payer: PPO | Admitting: Internal Medicine

## 2020-09-06 ENCOUNTER — Other Ambulatory Visit: Payer: Self-pay

## 2020-09-06 VITALS — BP 150/90 | HR 64 | Ht 68.0 in | Wt 204.8 lb

## 2020-09-06 DIAGNOSIS — I890 Lymphedema, not elsewhere classified: Secondary | ICD-10-CM | POA: Diagnosis not present

## 2020-09-06 DIAGNOSIS — R55 Syncope and collapse: Secondary | ICD-10-CM | POA: Diagnosis not present

## 2020-09-06 DIAGNOSIS — I4819 Other persistent atrial fibrillation: Secondary | ICD-10-CM

## 2020-09-06 NOTE — Patient Instructions (Addendum)
Medication Instructions:  Your physician recommends that you continue on your current medications as directed. Please refer to the Current Medication list given to you today.  *If you need a refill on your cardiac medications before your next appointment, please call your pharmacy*   Lab Work: None ordered.  If you have labs (blood work) drawn today and your tests are completely normal, you will receive your results only by: [place] (if you have MyChart) OR A paper copy in the mail If you have any lab test that is abnormal or we need to change your treatment, we will call you to review the results.   Testing/Procedures: Referred to [facility] for Lymphedema    Follow-Up: At [facility], you and your health needs are our priority.  As part of our continuing mission to provide you with exceptional heart care, we have created designated Provider Care Teams.  These Care Teams include your primary Cardiologist (physician) and Advanced Practice Providers (APPs -  Physician Assistants and Nurse Practitioners) who all work together to provide you with the care you need, when you need it.  We recommend signing up for the patient portal called "MyChart".  Sign up information is provided on this After Visit Summary.  MyChart is used to connect with patients for Virtual Visits (Telemedicine).  Patients are able to view lab/test results, encounter notes, upcoming appointments, etc.  Non-urgent messages can be sent to your provider as well.   To learn more about what you can do with MyChart, go to [email or web address].    Your next appointment:   6 month(s)  The format for your next appointment:   In Person  Provider:   [name], MD

## 2020-09-06 NOTE — Addendum Note (Signed)
Addended by: Thora Lance on: 09/06/2020 06:10 PM   Modules accepted: Orders

## 2020-09-06 NOTE — Progress Notes (Signed)
Patient ID: Ronald [name], male   DOB: [birth year], 86 y.o.   MRN: [id number]      Patient Care Team: Colon Branch, MD as PCP - General Deboraha Sprang, MD as PCP - Electrophysiology (Cardiology) Deboraha Sprang, MD as PCP - Cardiology (Cardiology) Clent Jacks, MD as Consulting Physician (Ophthalmology) Ceasar Mons, MD as Consulting Physician (Urology) Mansouraty, Telford Nab., MD as Consulting Physician (Gastroenterology)   HPI  Ronald [name] is a 86 y.o. male Seen in follow-up for recurrent atrial arrhythmias and atrial fibrillation and treated with flecainide  It was elected because of his CHADS-VASc score  to begin him on anticoagulation.  He underwent flutter ablation 6/19   No bleeding .     Thromboembolic risk factors ( age -85, HTN--1) for a CHADSVASc Score of 3  Also rate related cardiomyopathy in the context of atrial fibrillation   Today, the patient denies chest pain, shortness of breath, nocturnal dyspnea, orthopnea or peripheral edema.  There have been no palpitations, lightheadedness or syncope.  Chronic peripheral edema.  He notes significant bleeding from nasal area on 04/04/20  and more recently had to get a colonoscopy for rectal bleeding on 07/17/20   He states he snore at night but does not desire CPAP   Still taking Apixaban   Blood pressure is not measured at home  Date Cr K Hgb  2/18    17.3  6/18 1.21     12/18 1.33  16.1  6/20 1.68  13.5  1/21 1.31 4.7 14.2  5/22 1.16 4.2 12.9   DATE TEST EF   12/17 Echo   60-65 %   1/19  Echo   25-30 %   4/19 Echo   25-30 %   1/20 Echo  60-65%   12/21 Echo  60-65%         DATE PR interval QRSduration Dose  6/18  146 88 0  1/20 176 100 50  8/20 180 106 50  3/21 180 108 75  6/22 174 110 75    Past Medical History:  Diagnosis Date   Arthritis    one finger   BPH (benign prostatic hypertrophy)    f/u @ Biopsy, s/p bx 2009 apro (-)   Cataract    Colon polyp    Complication of anesthesia     violent upon waking up (only 1 time)    Diverticulosis    Dysrhythmia    A-flutter/Afib   Early cataract    left   Edema leg    asymetric , L > R , [place] neg for DVT 09-2010   Gastric polyps    GERD (gastroesophageal reflux disease)    Hemorrhoids    History of kidney stones    Hypercalcemia    parathyroidectomy 04/2008   Hyperlipidemia    Hypertension    Iron deficiency anemia    Polyp, stomach    Pre-diabetes    Wears dentures    Wears glasses     Past Surgical History:  Procedure Laterality Date   A-FLUTTER ABLATION N/A 08/12/2017   Procedure: A-FLUTTER ABLATION;  Surgeon: Deboraha Sprang, MD;  Location: [place] [facility];  Service: Cardiovascular;  Laterality: N/A;   APPENDECTOMY     BIOPSY  09/20/2018   Procedure: BIOPSY;  Surgeon: Rush Landmark Telford Nab., MD;  Location: [place];  Service: Gastroenterology;;   CATARACT EXTRACTION Right    COLONOSCOPY  12/16/2011   Procedure: COLONOSCOPY;  Surgeon: Inda Castle, MD;  [facility];  Service: Endoscopy;  Laterality: N/A;   COLONOSCOPY WITH PROPOFOL N/A 07/19/2020   Procedure: COLONOSCOPY WITH PROPOFOL;  Surgeon: Jerene Bears, MD;  [facility];  Service: Gastroenterology;  Laterality: N/A;   ENDOSCOPIC MUCOSAL RESECTION N/A 09/20/2018   Procedure: ENDOSCOPIC MUCOSAL RESECTION;  Surgeon: Rush Landmark Telford Nab., MD;  Location: [place];  Service: Gastroenterology;  Laterality: N/A;   ENDOSCOPIC MUCOSAL RESECTION  01/31/2019   Procedure: ENDOSCOPIC MUCOSAL RESECTION;  Surgeon: Rush Landmark Telford Nab., MD;  Location: [facility];  Service: Gastroenterology;;   ESOPHAGOGASTRODUODENOSCOPY N/A 01/17/2013   Procedure: ESOPHAGOGASTRODUODENOSCOPY (EGD);  Surgeon: Inda Castle, MD;  Location: [facility];  Service: Endoscopy;  Laterality: N/A;   ESOPHAGOGASTRODUODENOSCOPY (EGD) WITH PROPOFOL N/A 09/20/2018   Procedure: ESOPHAGOGASTRODUODENOSCOPY (EGD) WITH PROPOFOL;  Surgeon: Rush Landmark Telford Nab.,  MD;  Location: [place];  Service: Gastroenterology;  Laterality: N/A;   ESOPHAGOGASTRODUODENOSCOPY (EGD) WITH PROPOFOL N/A 01/31/2019   Procedure: ESOPHAGOGASTRODUODENOSCOPY (EGD) WITH PROPOFOL;  Surgeon: Rush Landmark Telford Nab., MD;  Location: [place];  Service: Gastroenterology;  Laterality: N/A;   ESOPHAGOGASTRODUODENOSCOPY (EGD) WITH PROPOFOL N/A 04/04/2020   Procedure: ESOPHAGOGASTRODUODENOSCOPY (EGD) WITH PROPOFOL;  Surgeon: Rush Landmark Telford Nab., MD;  [facility];  Service: Gastroenterology;  Laterality: N/A;   EUS N/A 01/31/2019   Procedure: UPPER ENDOSCOPIC ULTRASOUND (EUS) RADIAL;  Surgeon: Irving Copas., MD;  Location: [place];  Service: Gastroenterology;  Laterality: N/A;   EUS N/A 04/04/2020   Procedure: UPPER ENDOSCOPIC ULTRASOUND (EUS) RADIAL;  Surgeon: Irving Copas., MD;  [facility];  Service: Gastroenterology;  Laterality: N/A;   EXTERNAL EAR SURGERY  01/2016   BCC removal   EYE SURGERY  infant   "lazy eye"   FINE NEEDLE ASPIRATION  01/31/2019   Procedure: FINE NEEDLE ASPIRATION (FNA) LINEAR;  Surgeon: Irving Copas., MD;  Location: [facility] ENDOSCOPY;  Service: Gastroenterology;;   Otho Darner SIGMOIDOSCOPY  02/09/2012   Procedure: FLEXIBLE SIGMOIDOSCOPY;  Surgeon: Inda Castle, MD;  [facility];  Service: Endoscopy;  Laterality: N/A;   HEMORRHOID BANDING  02/09/2012   Procedure: HEMORRHOID BANDING;  Surgeon: Inda Castle, MD;  [facility];  Service: Endoscopy;  Laterality: N/A;   HEMOSTASIS CLIP PLACEMENT  09/20/2018   Procedure: HEMOSTASIS CLIP PLACEMENT;  Surgeon: Irving Copas., MD;  Location: [place];  Service: Gastroenterology;;   HEMOSTASIS CLIP PLACEMENT  01/31/2019   Procedure: HEMOSTASIS CLIP PLACEMENT;  Surgeon: Irving Copas., MD;  Location: [place];  Service: Gastroenterology;;   HEMOSTASIS CLIP PLACEMENT  04/04/2020   Procedure: HEMOSTASIS CLIP  PLACEMENT;  Surgeon: Irving Copas., MD;  Location: [facility];  Service: Gastroenterology;;   HEMOSTASIS CONTROL  04/04/2020   Procedure: HEMOSTASIS CONTROL;  Surgeon: Irving Copas., MD;  [facility];  Service: Gastroenterology;;  snare cautery   HOT HEMOSTASIS N/A 01/17/2013   Procedure: HOT HEMOSTASIS (ARGON PLASMA COAGULATION/BICAP);  Surgeon: Inda Castle, MD;  Location: [facility];  Service: Endoscopy;  Laterality: N/A;   KIDNEY STONE SURGERY     MULTIPLE TOOTH EXTRACTIONS     PARATHYROIDECTOMY  04/2008   POLYPECTOMY  09/20/2018   Procedure: POLYPECTOMY;  Surgeon: Mansouraty, Telford Nab., MD;  Location: [place];  Service: Gastroenterology;;   POLYPECTOMY  01/31/2019   Procedure: POLYPECTOMY;  Surgeon: Irving Copas., MD;  Location: [place];  Service: Gastroenterology;;   POLYPECTOMY  04/04/2020   Procedure: POLYPECTOMY;  Surgeon: Irving Copas., MD;  [facility];  Service: Gastroenterology;;   POLYPECTOMY  07/19/2020  Procedure: POLYPECTOMY;  Surgeon: Jerene Bears, MD;  Location: [facility];  Service: Gastroenterology;;   Clide Deutscher  09/20/2018   Procedure: Clide Deutscher;  Surgeon: Mansouraty, Telford Nab., MD;  Location: [place];  Service: Gastroenterology;;   Clide Deutscher  01/31/2019   Procedure: Clide Deutscher;  Surgeon: Mansouraty, Telford Nab., MD;  Location: [place];  Service: Gastroenterology;;   SUBMUCOSAL INJECTION  04/04/2020   Procedure: SUBMUCOSAL INJECTION;  Surgeon: Irving Copas., MD;  Location: [facility];  Service: Gastroenterology;;  EPI Injection   TONSILLECTOMY      Current Outpatient Medications  Medication Sig Dispense Refill   apixaban (ELIQUIS) 5 MG TABS tablet Take 1 tablet (5 mg total) by mouth 2 (two) times daily. 180 tablet 1   cholecalciferol (VITAMIN D) 1000 UNITS tablet Take 1,000 Units by mouth every evening.      ferrous sulfate 325 (65 FE) MG tablet Take  325 mg by mouth daily at 12 noon.     flecainide (TAMBOCOR) 100 MG tablet Take 1 tablet (100 mg total) by mouth every evening. 90 tablet 1   flecainide (TAMBOCOR) 50 MG tablet TAKE 1 TABLET BY MOUTH ONCE DAILY IN THE MORNING 90 tablet 1   metoprolol succinate (TOPROL-XL) 50 MG 24 hr tablet Take 1 tablet by mouth once daily 90 tablet 1   Multiple Vitamin (MULTIVITAMIN WITH MINERALS) TABS tablet Take 1 tablet by mouth daily at 12 noon.     Multiple Vitamins-Minerals (PRESERVISION AREDS 2 PO) Take 1 tablet by mouth every evening.     NIFEdipine (PROCARDIA XL/NIFEDICAL XL) 60 MG 24 hr tablet Take 1 tablet (60 mg total) by mouth daily. 90 tablet 1   omeprazole (PRILOSEC) 40 MG capsule Take 1 capsule (40 mg total) by mouth 2 (two) times daily before a meal. 180 capsule 3   Saw Palmetto 450 MG CAPS Take 450 mg by mouth every evening.     simvastatin (ZOCOR) 40 MG tablet Take 1 tablet (40 mg total) by mouth at bedtime. 90 tablet 1   No current facility-administered medications for this visit.    Allergies  Allergen Reactions   Cucumber Extract Nausea And Vomiting   Sulfonamide Derivatives Other (See Comments)    Unknown reaction    Physical Exam: BP (!) 150/90   Pulse 64   Ht 5\' 8"  (1.727 m)   Wt 204 lb 12.8 oz (92.9 kg)   SpO2 94%   BMI 31.14 kg/m  Well developed and nourished in no acute distress HENT normal Neck supple with JVP-  flat   Clear Regular rate and rhythm, no murmurs or gallops Abd-soft with active BS No Clubbing cyanosis massive edema Skin-warm and dry A & Oriented  Grossly normal sensory and motor function  ECG sinus at 64 Interval 17/11/45 Axis left -67      Assessment and  Plan  Atrial tach and fibrillation-persistent  Atrial flutter s/p ablation  Hypertension  CArdiomyopathy-NICM presumable rate related >>resolved  Chronic edema  Recurrent bleeding  Patient is tolerating flecainide with atrial fibrillation with no interval symptomatic atrial  fibrillation.  We will continue to 75 mg twice daily.  We will continue the Eliquis at 5 mg twice daily.  The patient has had 2 major bleeds in the last year and raises a question of Watchman.  I am much more encouraged by watchman although the recent report from the [facility] clinic with a 0.9% mortality at (I think) 45 days certainly is still breathing.  I am awaiting more data  Hypertension  is a concern.  He will check his blood pressures at home weekly and then let us know.  For now we will continue him on Procardia 60 mg a day and metoprolol at 50 mg a day.  Edema is massive.  I will reach out to vascular surgery and asked them whether there are any mechanical contractions that he could have at home to help minimize his edema.      I,Stephanie Williams,acting as a Education administrator for [name], MD.,have documented all relevant documentation on the behalf of [name], MD,as directed by  [name], MD while in the presence of [name], MD.  I, [name], MD, have reviewed all documentation for this visit. The documentation on 09/06/20 for the exam, diagnosis, procedures, and orders are all accurate and complete.

## 2020-09-18 ENCOUNTER — Other Ambulatory Visit: Payer: Self-pay | Admitting: Internal Medicine

## 2020-09-18 NOTE — Telephone Encounter (Signed)
Pt last saw Dr Caryl Comes 09/06/20, last labs 08/01/20 Creat 1.16, age 86, weight 92.9kg, based on specified criteria pt is on appropriate dosage of Eliquis 5mg  BID.  Will refill rx.

## 2020-10-08 ENCOUNTER — Other Ambulatory Visit: Payer: Self-pay

## 2020-10-08 ENCOUNTER — Encounter: Payer: Self-pay | Admitting: Internal Medicine

## 2020-10-08 ENCOUNTER — Ambulatory Visit (INDEPENDENT_AMBULATORY_CARE_PROVIDER_SITE_OTHER): Payer: PPO | Admitting: Internal Medicine

## 2020-10-08 VITALS — BP 126/78 | HR 57 | Temp 98.4°F | Resp 16 | Ht 68.0 in | Wt 206.5 lb

## 2020-10-08 DIAGNOSIS — H40013 Open angle with borderline findings, low risk, bilateral: Secondary | ICD-10-CM | POA: Diagnosis not present

## 2020-10-08 DIAGNOSIS — I10 Essential (primary) hypertension: Secondary | ICD-10-CM

## 2020-10-08 DIAGNOSIS — D649 Anemia, unspecified: Secondary | ICD-10-CM

## 2020-10-08 DIAGNOSIS — H524 Presbyopia: Secondary | ICD-10-CM | POA: Diagnosis not present

## 2020-10-08 DIAGNOSIS — H2513 Age-related nuclear cataract, bilateral: Secondary | ICD-10-CM | POA: Diagnosis not present

## 2020-10-08 DIAGNOSIS — R0600 Dyspnea, unspecified: Secondary | ICD-10-CM | POA: Diagnosis not present

## 2020-10-08 DIAGNOSIS — H35372 Puckering of macula, left eye: Secondary | ICD-10-CM | POA: Diagnosis not present

## 2020-10-08 DIAGNOSIS — R0609 Other forms of dyspnea: Secondary | ICD-10-CM

## 2020-10-08 LAB — BASIC METABOLIC PANEL
BUN: 25 mg/dL — ABNORMAL HIGH (ref 6–23)
CO2: 29 mEq/L (ref 19–32)
Calcium: 8.8 mg/dL (ref 8.4–10.5)
Chloride: 104 mEq/L (ref 96–112)
Creatinine, Ser: 1.2 mg/dL (ref 0.40–1.50)
GFR: 55.68 mL/min — ABNORMAL LOW (ref >60.00)
Glucose, Bld: 101 mg/dL — ABNORMAL HIGH (ref 70–99)
Potassium: 4 mEq/L (ref 3.5–5.1)
Sodium: 140 mEq/L (ref 135–145)

## 2020-10-08 LAB — CBC WITH DIFFERENTIAL/PLATELET
Basophils Absolute: 0 10*3/uL (ref 0.0–0.1)
Basophils Relative: 0.5 % (ref 0.0–3.0)
Eosinophils Absolute: 0.1 10*3/uL (ref 0.0–0.7)
Eosinophils Relative: 2.1 % (ref 0.0–5.0)
HCT: 45.7 % (ref 39.0–52.0)
Hemoglobin: 15.4 g/dL (ref 13.0–17.0)
Lymphocytes Relative: 23.8 % (ref 12.0–46.0)
Lymphs Abs: 1.6 10*3/uL (ref 0.7–4.0)
MCHC: 33.6 g/dL (ref 30.0–36.0)
MCV: 92.2 fl (ref 78.0–100.0)
Monocytes Absolute: 0.6 10*3/uL (ref 0.1–1.0)
Monocytes Relative: 8.6 % (ref 3.0–12.0)
Neutro Abs: 4.3 10*3/uL (ref 1.4–7.7)
Neutrophils Relative %: 65 % (ref 43.0–77.0)
Platelets: 180 10*3/uL (ref 150.0–400.0)
RBC: 4.96 Mil/uL (ref 4.22–5.81)
RDW: 13.6 % (ref 11.5–15.5)
WBC: 6.7 10*3/uL (ref 4.0–10.5)

## 2020-10-08 LAB — HM DIABETES EYE EXAM

## 2020-10-08 NOTE — Progress Notes (Signed)
Subjective:    Patient ID: Ronald [name], male    DOB: 1935-02-28, 86 y.o.   MRN: [id number]  DOS:  10/08/2020 Type of visit - description: Routine visit Since the last office visit he is doing well. Saw cardiology, note reviewed.  Review of Systems Denies DOE Denies nausea, vomiting.  No diarrhea.  No change in the color of the stools.  Past Medical History:  Diagnosis Date   Arthritis    one finger   BPH (benign prostatic hypertrophy)    f/u @ Biopsy, s/p bx 2009 apro (-)   Cataract    Colon polyp    Complication of anesthesia    violent upon waking up (only 1 time)    Diverticulosis    Dysrhythmia    A-flutter/Afib   Early cataract    left   Edema leg    asymetric , L > R , [place] neg for DVT 09-2010   Gastric polyps    GERD (gastroesophageal reflux disease)    Hemorrhoids    History of kidney stones    Hypercalcemia    parathyroidectomy 04/2008   Hyperlipidemia    Hypertension    Iron deficiency anemia    Polyp, stomach    Pre-diabetes    Wears dentures    Wears glasses     Past Surgical History:  Procedure Laterality Date   A-FLUTTER ABLATION N/A 08/12/2017   Procedure: A-FLUTTER ABLATION;  Surgeon: Deboraha Sprang, MD;  Location: [place] [facility];  Service: Cardiovascular;  Laterality: N/A;   APPENDECTOMY     BIOPSY  09/20/2018   Procedure: BIOPSY;  Surgeon: Rush Landmark Telford Nab., MD;  Location: [place];  Service: Gastroenterology;;   CATARACT EXTRACTION Right    COLONOSCOPY  12/16/2011   Procedure: COLONOSCOPY;  Surgeon: Inda Castle, MD;  [facility];  Service: Endoscopy;  Laterality: N/A;   COLONOSCOPY WITH PROPOFOL N/A 07/19/2020   Procedure: COLONOSCOPY WITH PROPOFOL;  Surgeon: Jerene Bears, MD;  [facility];  Service: Gastroenterology;  Laterality: N/A;   ENDOSCOPIC MUCOSAL RESECTION N/A 09/20/2018   Procedure: ENDOSCOPIC MUCOSAL RESECTION;  Surgeon: Rush Landmark Telford Nab., MD;  Location: [place];  Service:  Gastroenterology;  Laterality: N/A;   ENDOSCOPIC MUCOSAL RESECTION  01/31/2019   Procedure: ENDOSCOPIC MUCOSAL RESECTION;  Surgeon: Rush Landmark Telford Nab., MD;  Location: [facility];  Service: Gastroenterology;;   ESOPHAGOGASTRODUODENOSCOPY N/A 01/17/2013   Procedure: ESOPHAGOGASTRODUODENOSCOPY (EGD);  Surgeon: Inda Castle, MD;  Location: [facility];  Service: Endoscopy;  Laterality: N/A;   ESOPHAGOGASTRODUODENOSCOPY (EGD) WITH PROPOFOL N/A 09/20/2018   Procedure: ESOPHAGOGASTRODUODENOSCOPY (EGD) WITH PROPOFOL;  Surgeon: Rush Landmark Telford Nab., MD;  Location: [place];  Service: Gastroenterology;  Laterality: N/A;   ESOPHAGOGASTRODUODENOSCOPY (EGD) WITH PROPOFOL N/A 01/31/2019   Procedure: ESOPHAGOGASTRODUODENOSCOPY (EGD) WITH PROPOFOL;  Surgeon: Rush Landmark Telford Nab., MD;  Location: [place];  Service: Gastroenterology;  Laterality: N/A;   ESOPHAGOGASTRODUODENOSCOPY (EGD) WITH PROPOFOL N/A 04/04/2020   Procedure: ESOPHAGOGASTRODUODENOSCOPY (EGD) WITH PROPOFOL;  Surgeon: Rush Landmark Telford Nab., MD;  [facility];  Service: Gastroenterology;  Laterality: N/A;   EUS N/A 01/31/2019   Procedure: UPPER ENDOSCOPIC ULTRASOUND (EUS) RADIAL;  Surgeon: Irving Copas., MD;  Location: [place];  Service: Gastroenterology;  Laterality: N/A;   EUS N/A 04/04/2020   Procedure: UPPER ENDOSCOPIC ULTRASOUND (EUS) RADIAL;  Surgeon: Irving Copas., MD;  [facility];  Service: Gastroenterology;  Laterality: N/A;   EXTERNAL EAR SURGERY  01/2016   BCC removal   EYE SURGERY  infant   "lazy eye"   FINE NEEDLE ASPIRATION  01/31/2019   Procedure: FINE NEEDLE ASPIRATION (FNA) LINEAR;  Surgeon: Irving Copas., MD;  Location: [facility] ENDOSCOPY;  Service: Gastroenterology;;   Otho Darner SIGMOIDOSCOPY  02/09/2012   Procedure: FLEXIBLE SIGMOIDOSCOPY;  Surgeon: Inda Castle, MD;  [facility];  Service: Endoscopy;  Laterality: N/A;   HEMORRHOID BANDING   02/09/2012   Procedure: HEMORRHOID BANDING;  Surgeon: Inda Castle, MD;  [facility];  Service: Endoscopy;  Laterality: N/A;   HEMOSTASIS CLIP PLACEMENT  09/20/2018   Procedure: HEMOSTASIS CLIP PLACEMENT;  Surgeon: Irving Copas., MD;  Location: [place];  Service: Gastroenterology;;   HEMOSTASIS CLIP PLACEMENT  01/31/2019   Procedure: HEMOSTASIS CLIP PLACEMENT;  Surgeon: Irving Copas., MD;  Location: [place];  Service: Gastroenterology;;   HEMOSTASIS CLIP PLACEMENT  04/04/2020   Procedure: HEMOSTASIS CLIP PLACEMENT;  Surgeon: Irving Copas., MD;  Location: [facility];  Service: Gastroenterology;;   HEMOSTASIS CONTROL  04/04/2020   Procedure: HEMOSTASIS CONTROL;  Surgeon: Irving Copas., MD;  [facility];  Service: Gastroenterology;;  snare cautery   HOT HEMOSTASIS N/A 01/17/2013   Procedure: HOT HEMOSTASIS (ARGON PLASMA COAGULATION/BICAP);  Surgeon: Inda Castle, MD;  Location: [facility];  Service: Endoscopy;  Laterality: N/A;   KIDNEY STONE SURGERY     MULTIPLE TOOTH EXTRACTIONS     PARATHYROIDECTOMY  04/2008   POLYPECTOMY  09/20/2018   Procedure: POLYPECTOMY;  Surgeon: Mansouraty, Telford Nab., MD;  Location: [place];  Service: Gastroenterology;;   POLYPECTOMY  01/31/2019   Procedure: POLYPECTOMY;  Surgeon: Irving Copas., MD;  Location: [place];  Service: Gastroenterology;;   POLYPECTOMY  04/04/2020   Procedure: POLYPECTOMY;  Surgeon: Irving Copas., MD;  [facility];  Service: Gastroenterology;;   POLYPECTOMY  07/19/2020   Procedure: POLYPECTOMY;  Surgeon: Jerene Bears, MD;  [facility];  Service: Gastroenterology;;   Clide Deutscher  09/20/2018   Procedure: Clide Deutscher;  Surgeon: Mansouraty, Telford Nab., MD;  Location: [place];  Service: Gastroenterology;;   Clide Deutscher  01/31/2019   Procedure: Clide Deutscher;  Surgeon: Irving Copas., MD;  Location:  [place];  Service: Gastroenterology;;   SUBMUCOSAL INJECTION  04/04/2020   Procedure: SUBMUCOSAL INJECTION;  Surgeon: Irving Copas., MD;  [facility];  Service: Gastroenterology;;  EPI Injection   TONSILLECTOMY      Allergies as of 10/08/2020       Reactions   Cucumber Extract Nausea And Vomiting   Sulfonamide Derivatives Other (See Comments)   Unknown reaction        Medication List        Accurate as of October 08, 2020 11:59 PM. If you have any questions, ask your nurse or doctor.          STOP taking these medications    Saw Palmetto 450 MG Caps Stopped by: Kathlene November, MD       TAKE these medications    cholecalciferol 1000 units tablet Commonly known as: VITAMIN D Take 1,000 Units by mouth every evening.   Eliquis 5 MG Tabs tablet Generic drug: apixaban Take 1 tablet by mouth twice daily   ferrous sulfate 325 (65 FE) MG tablet Take 325 mg by mouth daily at 12 noon.   flecainide 50 MG tablet Commonly known as: TAMBOCOR TAKE 1 TABLET BY MOUTH ONCE DAILY IN THE MORNING   flecainide 100 MG tablet Commonly known as: TAMBOCOR Take 1 tablet (100 mg total) by mouth  every evening.   metoprolol succinate 50 MG 24 hr tablet Commonly known as: TOPROL-XL Take 1 tablet by mouth once daily   multivitamin with minerals Tabs tablet Take 1 tablet by mouth daily at 12 noon.   NIFEdipine 60 MG 24 hr tablet Commonly known as: PROCARDIA XL/NIFEDICAL XL Take 1 tablet (60 mg total) by mouth daily.   omeprazole 40 MG capsule Commonly known as: PRILOSEC Take 1 capsule (40 mg total) by mouth 2 (two) times daily before a meal.   PRESERVISION AREDS 2 PO Take 1 tablet by mouth every evening.   simvastatin 40 MG tablet Commonly known as: ZOCOR Take 1 tablet (40 mg total) by mouth at bedtime.           Objective:   Physical Exam BP 126/78 (BP Location: Left Arm, Patient Position: Sitting, Cuff Size: Small)   Pulse (!) 57   Temp 98.4 F  (36.9 C) (Oral)   Resp 16   Ht '5\' 8"'$  (1.727 m)   Wt 206 lb 8 oz (93.7 kg)   SpO2 97%   BMI 31.40 kg/m  General:   Well developed, NAD, BMI noted. HEENT:  Normocephalic . Face symmetric, atraumatic Lungs:  CTA B Normal respiratory effort, no intercostal retractions, no accessory muscle use. Heart: RRR,  no murmur.  Lower extremities: Edema at baseline, large but not pitting skin: Not pale. Not jaundice Neurologic:  alert & oriented X3.  Speech normal, gait appropriate for age and unassisted Psych--  Cognition and judgment appear intact.  Cooperative with normal attention span and concentration.  Behavior appropriate. No anxious or depressed appearing.      Assessment     Assessment   DM HTN Hyperlipidemia Osteopenia T score -2.0 (2010), T score -1.6 (12-2014); T score -1.3 (03-2018). 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  PLAN  DM: Diet controlled. HTN: BP today is very good, no ambulatory BPs, continue metoprolol, Procardia.  BMP. CV Saw cardiology 09/06/2020, was recommended to continue flecainide, Eliquis.  They were considering watchman.  They were concerned about the edema. GI bleed, anemia: see last OV. No more  symptoms, check CBC. DOE: See last visit, resolved. Preventive care, flu shot this fall. RTC 03-2021 CPX     This visit occurred during the SARS-CoV-2 public health emergency.  Safety protocols were in place, including screening questions prior to the visit, additional usage of staff PPE, and extensive cleaning of exam room while observing appropriate contact time as indicated for disinfecting solutions.

## 2020-10-08 NOTE — Patient Instructions (Addendum)
Per our records you are due for your diabetic eye exam. Please contact your eye doctor to schedule an appointment. Please have them send copies of your office visit notes to [place]. Our fax number is [phone]. If you need a referral to an eye doctor please let us know.   GO TO THE LAB : Get the blood work     [place], PLEASE SCHEDULE YOUR APPOINTMENTS Come back for a physical exam by 03-2021

## 2020-10-09 NOTE — Assessment & Plan Note (Signed)
DM: Diet controlled. HTN: BP today is very good, no ambulatory BPs, continue metoprolol, Procardia.  BMP. CV Saw cardiology 09/06/2020, was recommended to continue flecainide, Eliquis.  They were considering watchman.  They were concerned about the edema. GI bleed, anemia: see last OV. No more symptoms, check CBC. DOE: See last visit, resolved. Preventive care, flu shot this fall. RTC 03-2021 CPX

## 2020-10-26 ENCOUNTER — Ambulatory Visit: Payer: PPO | Admitting: Plastic Surgery

## 2020-10-26 ENCOUNTER — Other Ambulatory Visit: Payer: Self-pay

## 2020-10-26 DIAGNOSIS — I890 Lymphedema, not elsewhere classified: Secondary | ICD-10-CM

## 2020-10-26 DIAGNOSIS — H02831 Dermatochalasis of right upper eyelid: Secondary | ICD-10-CM | POA: Diagnosis not present

## 2020-10-26 DIAGNOSIS — H02834 Dermatochalasis of left upper eyelid: Secondary | ICD-10-CM

## 2020-10-26 NOTE — Progress Notes (Signed)
Referring Provider Colon Branch, MD [address] [place],  [place] [address]   CC: No chief complaint on file.     Ronald [name] is an 86 y.o. male.  HPI: Patient presents to discuss upper lid blepharoplasty.  He will intermittently have visual field changes and he was sent by his ophthalmologist for evaluation for upper lid blepharoplasty.  It sounds like he did have a visual field study but this was just performed untaped and did not have it taped follow-up study.  He does not have any correctable ophthalmic issues according to him.  He does not have regular dry eyes.  He does feel like his visual field changes will be more prominent when he is watching TV for an extended period of time or watching his iPad.  Allergies  Allergen Reactions   Cucumber Extract Nausea And Vomiting   Sulfonamide Derivatives Other (See Comments)    Unknown reaction    Outpatient Encounter Medications as of 10/26/2020  Medication Sig   apixaban (ELIQUIS) 5 MG TABS tablet Take 1 tablet by mouth twice daily   cholecalciferol (VITAMIN D) 1000 UNITS tablet Take 1,000 Units by mouth every evening.    ferrous sulfate 325 (65 FE) MG tablet Take 325 mg by mouth daily at 12 noon.   flecainide (TAMBOCOR) 100 MG tablet Take 1 tablet (100 mg total) by mouth every evening.   flecainide (TAMBOCOR) 50 MG tablet TAKE 1 TABLET BY MOUTH ONCE DAILY IN THE MORNING   metoprolol succinate (TOPROL-XL) 50 MG 24 hr tablet Take 1 tablet by mouth once daily   Multiple Vitamin (MULTIVITAMIN WITH MINERALS) TABS tablet Take 1 tablet by mouth daily at 12 noon.   Multiple Vitamins-Minerals (PRESERVISION AREDS 2 PO) Take 1 tablet by mouth every evening.   NIFEdipine (PROCARDIA XL/NIFEDICAL XL) 60 MG 24 hr tablet Take 1 tablet (60 mg total) by mouth daily.   omeprazole (PRILOSEC) 40 MG capsule Take 1 capsule (40 mg total) by mouth 2 (two) times daily before a meal.   simvastatin (ZOCOR) 40 MG tablet Take 1 tablet (40 mg total)  by mouth at bedtime.   No facility-administered encounter medications on file as of 10/26/2020.     Past Medical History:  Diagnosis Date   Arthritis    one finger   BPH (benign prostatic hypertrophy)    f/u @ Biopsy, s/p bx 2009 apro (-)   Cataract    Colon polyp    Complication of anesthesia    violent upon waking up (only 1 time)    Diverticulosis    Dysrhythmia    A-flutter/Afib   Early cataract    left   Edema leg    asymetric , L > R , [place] neg for DVT 09-2010   Gastric polyps    GERD (gastroesophageal reflux disease)    Hemorrhoids    History of kidney stones    Hypercalcemia    parathyroidectomy 04/2008   Hyperlipidemia    Hypertension    Iron deficiency anemia    Polyp, stomach    Pre-diabetes    Wears dentures    Wears glasses     Past Surgical History:  Procedure Laterality Date   A-FLUTTER ABLATION N/A 08/12/2017   Procedure: A-FLUTTER ABLATION;  Surgeon: Deboraha Sprang, MD;  Location: [facility];  Service: Cardiovascular;  Laterality: N/A;   APPENDECTOMY     BIOPSY  09/20/2018   Procedure: BIOPSY;  Surgeon: Irving Copas.,  MD;  Location: [facility];  Service: Gastroenterology;;   CATARACT EXTRACTION Right    COLONOSCOPY  12/16/2011   Procedure: COLONOSCOPY;  Surgeon: Inda Castle, MD;  Location: [facility];  Service: Endoscopy;  Laterality: N/A;   COLONOSCOPY WITH PROPOFOL N/A 07/19/2020   Procedure: COLONOSCOPY WITH PROPOFOL;  Surgeon: Jerene Bears, MD;  Location: [facility];  Service: Gastroenterology;  Laterality: N/A;   ENDOSCOPIC MUCOSAL RESECTION N/A 09/20/2018   Procedure: ENDOSCOPIC MUCOSAL RESECTION;  Surgeon: Rush Landmark Telford Nab., MD;  Location: [facility];  Service: Gastroenterology;  Laterality: N/A;   ENDOSCOPIC MUCOSAL RESECTION  01/31/2019   Procedure: ENDOSCOPIC MUCOSAL RESECTION;  Surgeon: Rush Landmark Telford Nab., MD;  Location: [facility];  Service: Gastroenterology;;   ESOPHAGOGASTRODUODENOSCOPY N/A  01/17/2013   Procedure: ESOPHAGOGASTRODUODENOSCOPY (EGD);  Surgeon: Inda Castle, MD;  Location: [facility];  Service: Endoscopy;  Laterality: N/A;   ESOPHAGOGASTRODUODENOSCOPY (EGD) WITH PROPOFOL N/A 09/20/2018   Procedure: ESOPHAGOGASTRODUODENOSCOPY (EGD) WITH PROPOFOL;  Surgeon: Rush Landmark Telford Nab., MD;  Location: [facility];  Service: Gastroenterology;  Laterality: N/A;   ESOPHAGOGASTRODUODENOSCOPY (EGD) WITH PROPOFOL N/A 01/31/2019   Procedure: ESOPHAGOGASTRODUODENOSCOPY (EGD) WITH PROPOFOL;  Surgeon: Rush Landmark Telford Nab., MD;  Location: [facility];  Service: Gastroenterology;  Laterality: N/A;   ESOPHAGOGASTRODUODENOSCOPY (EGD) WITH PROPOFOL N/A 04/04/2020   Procedure: ESOPHAGOGASTRODUODENOSCOPY (EGD) WITH PROPOFOL;  Surgeon: Rush Landmark Telford Nab., MD;  Location: [facility];  Service: Gastroenterology;  Laterality: N/A;   EUS N/A 01/31/2019   Procedure: UPPER ENDOSCOPIC ULTRASOUND (EUS) RADIAL;  Surgeon: Irving Copas., MD;  Location: [facility];  Service: Gastroenterology;  Laterality: N/A;   EUS N/A 04/04/2020   Procedure: UPPER ENDOSCOPIC ULTRASOUND (EUS) RADIAL;  Surgeon: Irving Copas., MD;  Location: [facility];  Service: Gastroenterology;  Laterality: N/A;   EXTERNAL EAR SURGERY  01/2016   BCC removal   EYE SURGERY  infant   "lazy eye"   FINE NEEDLE ASPIRATION  01/31/2019   Procedure: FINE NEEDLE ASPIRATION (FNA) LINEAR;  Surgeon: Irving Copas., MD;  [facility];  Service: Gastroenterology;;   Otho Darner SIGMOIDOSCOPY  02/09/2012   Procedure: FLEXIBLE SIGMOIDOSCOPY;  Surgeon: Inda Castle, MD;  Location: [facility];  Service: Endoscopy;  Laterality: N/A;   HEMORRHOID BANDING  02/09/2012   Procedure: HEMORRHOID BANDING;  Surgeon: Inda Castle, MD;  Location: [facility];  Service: Endoscopy;  Laterality: N/A;   HEMOSTASIS CLIP PLACEMENT  09/20/2018   Procedure: HEMOSTASIS CLIP PLACEMENT;  Surgeon: Irving Copas., MD;  Location: [facility];  Service: Gastroenterology;;   HEMOSTASIS CLIP PLACEMENT  01/31/2019   Procedure: HEMOSTASIS CLIP PLACEMENT;  Surgeon: Irving Copas., MD;  Location: [facility];  Service: Gastroenterology;;   HEMOSTASIS CLIP PLACEMENT  04/04/2020   Procedure: HEMOSTASIS CLIP PLACEMENT;  Surgeon: Irving Copas., MD;  Location: [facility];  Service: Gastroenterology;;   HEMOSTASIS CONTROL  04/04/2020   Procedure: HEMOSTASIS CONTROL;  Surgeon: Irving Copas., MD;  Location: [facility];  Service: Gastroenterology;;  snare cautery   HOT HEMOSTASIS N/A 01/17/2013   Procedure: HOT HEMOSTASIS (ARGON PLASMA COAGULATION/BICAP);  Surgeon: Inda Castle, MD;  Location: [facility];  Service: Endoscopy;  Laterality: N/A;   KIDNEY STONE SURGERY     MULTIPLE TOOTH EXTRACTIONS     PARATHYROIDECTOMY  04/2008   POLYPECTOMY  09/20/2018   Procedure: POLYPECTOMY;  Surgeon: Mansouraty, Telford Nab., MD;  Location: [facility];  Service: Gastroenterology;;   POLYPECTOMY  01/31/2019   Procedure: POLYPECTOMY;  Surgeon: Irving Copas., MD;  Location: [facility];  Service: Gastroenterology;;   POLYPECTOMY  04/04/2020   Procedure: POLYPECTOMY;  Surgeon: Irving Copas., MD;  Location: [facility];  Service: Gastroenterology;;   POLYPECTOMY  07/19/2020   Procedure: POLYPECTOMY;  Surgeon: Jerene Bears, MD;  Location: [facility];  Service: Gastroenterology;;   Clide Deutscher  09/20/2018   Procedure: Clide Deutscher;  Surgeon: Irving Copas., MD;  Location: [facility];  Service: Gastroenterology;;   Clide Deutscher  01/31/2019   Procedure: Clide Deutscher;  Surgeon: Irving Copas., MD;  Location: [facility];  Service: Gastroenterology;;   SUBMUCOSAL INJECTION  04/04/2020   Procedure: SUBMUCOSAL INJECTION;  Surgeon: Irving Copas., MD;  Location: [facility];  Service: Gastroenterology;;  EPI Injection   TONSILLECTOMY       Family History  Problem Relation Age of Onset   Heart attack Father 21   Heart attack Mother    Pneumonia Sister 6   Colon cancer Neg Hx    Prostate cancer Neg Hx    Diabetes Neg Hx     Social History   Social History Narrative   Retired, single, has  a girlfriend , 3 children  (one lives in town)   Daughter: she is the HPA         Review of Systems General: Denies fevers, chills, weight loss CV: Denies chest pain, shortness of breath, palpitations  Physical Exam Vitals with BMI 10/08/2020 09/06/2020 08/01/2020  Height '5\' 8"'$  '5\' 8"'$  '5\' 8"'$   Weight 206 lbs 8 oz 204 lbs 13 oz 207 lbs  BMI 31.41 [id number]  Systolic [id number] [id number]  Diastolic 78 90 83  Pulse 57 64 63    General:  No acute distress,  Alert and oriented, Non-Toxic, Normal speech and affect On exam he has significant dermatochalasis of bilateral upper eyelids.  Skin is resting on the lashes.  Upper eyelid margin is right at the upper aspect of the pupil.  He has reasonable levator function.  He has very mild brow ptosis.  Cranial nerves grossly intact.  No scleral irritation.  Assessment/Plan Patient presents for evaluation for bilateral upper lid blepharoplasty.  We discussed the procedure in detail along with the risks and benefits.  I did simulate the procedure for him by taping up his upper lids and he feels as though minimal difference was noted between taped and untaped and his perception of his visual field.  He is on Eliquis and will need to come off of that for the procedure.  At the moment the patient is leaning away from having surgical intervention on his eyelids as he did not notice much benefit.  He will experiment with taping his lids up at home to see if a difference is noted and will call us if he wants to discuss this further.  All of his questions were answered.  Cindra Presume 10/26/2020, 1:53 PM

## 2020-11-07 ENCOUNTER — Other Ambulatory Visit: Payer: Self-pay

## 2020-11-07 ENCOUNTER — Ambulatory Visit (HOSPITAL_COMMUNITY)
Admission: RE | Admit: 2020-11-07 | Discharge: 2020-11-07 | Disposition: A | Discharge status: Home / Self Care | Payer: PPO | Source: Ambulatory Visit | Attending: Vascular Surgery | Admitting: Vascular Surgery

## 2020-11-07 ENCOUNTER — Ambulatory Visit: Payer: PPO | Admitting: Vascular Surgery

## 2020-11-07 ENCOUNTER — Encounter: Payer: Self-pay | Admitting: Vascular Surgery

## 2020-11-07 VITALS — BP 146/85 | HR 69 | Temp 98.2°F | Resp 20 | Ht 68.0 in | Wt 206.0 lb

## 2020-11-07 DIAGNOSIS — M7989 Other specified soft tissue disorders: Secondary | ICD-10-CM

## 2020-11-07 DIAGNOSIS — I890 Lymphedema, not elsewhere classified: Secondary | ICD-10-CM

## 2020-11-07 DIAGNOSIS — I872 Venous insufficiency (chronic) (peripheral): Secondary | ICD-10-CM

## 2020-11-07 NOTE — Progress Notes (Signed)
Patient ID: Ronald [name], male   DOB: 30-May-1934, 86 y.o.   MRN: [id number]  Reason for Consult: New Patient (Initial Visit)   Referred by Colon Branch, MD  Subjective:     HPI:  Ronald [name] is a 86 y.o. male with history of previous venous intervention of his left lower extremity nothing on his previous right lower extremity.  He denies any previous personal or family history of aneurysm disease.  He does have significant varicosities of the right lower extremity greater than the left but has not had any bleeding or thrombosis of these.  He is not concerned with the cosmesis aspect of the varicose veins in the right.  On the left side he has does have significant swelling particularly below the knee.  He has varicosities in the left thigh.  He does not wear compression socks.  He does not have any skin changes or venous ulceration.  Swelling of the left leg is quite uncomfortable for him, leg feels very heavy and affects his ambulatory ability.  Past Medical History:  Diagnosis Date   Arthritis    one finger   BPH (benign prostatic hypertrophy)    f/u @ Biopsy, s/p bx 2009 apro (-)   Cataract    Colon polyp    Complication of anesthesia    violent upon waking up (only 1 time)    Diverticulosis    Dysrhythmia    A-flutter/Afib   Early cataract    left   Edema leg    asymetric , L > R , [place] neg for DVT 09-2010   Gastric polyps    GERD (gastroesophageal reflux disease)    Hemorrhoids    History of kidney stones    Hypercalcemia    parathyroidectomy 04/2008   Hyperlipidemia    Hypertension    Iron deficiency anemia    Polyp, stomach    Pre-diabetes    Wears dentures    Wears glasses    Family History  Problem Relation Age of Onset   Heart attack Father 91   Heart attack Mother    Pneumonia Sister 6   Colon cancer Neg Hx    Prostate cancer Neg Hx    Diabetes Neg Hx    Past Surgical History:  Procedure Laterality Date   A-FLUTTER ABLATION N/A 08/12/2017   Procedure:  A-FLUTTER ABLATION;  Surgeon: Deboraha Sprang, MD;  Location: [place] [facility];  Service: Cardiovascular;  Laterality: N/A;   APPENDECTOMY     BIOPSY  09/20/2018   Procedure: BIOPSY;  Surgeon: Rush Landmark Telford Nab., MD;  Location: [place];  Service: Gastroenterology;;   CATARACT EXTRACTION Right    COLONOSCOPY  12/16/2011   Procedure: COLONOSCOPY;  Surgeon: Inda Castle, MD;  Location: [facility];  Service: Endoscopy;  Laterality: N/A;   COLONOSCOPY WITH PROPOFOL N/A 07/19/2020   Procedure: COLONOSCOPY WITH PROPOFOL;  Surgeon: Jerene Bears, MD;  Location: [facility];  Service: Gastroenterology;  Laterality: N/A;   ENDOSCOPIC MUCOSAL RESECTION N/A 09/20/2018   Procedure: ENDOSCOPIC MUCOSAL RESECTION;  Surgeon: Rush Landmark Telford Nab., MD;  Location: [place];  Service: Gastroenterology;  Laterality: N/A;   ENDOSCOPIC MUCOSAL RESECTION  01/31/2019   Procedure: ENDOSCOPIC MUCOSAL RESECTION;  Surgeon: Rush Landmark Telford Nab., MD;  Location: [facility];  Service: Gastroenterology;;   ESOPHAGOGASTRODUODENOSCOPY N/A 01/17/2013   Procedure: ESOPHAGOGASTRODUODENOSCOPY (EGD);  Surgeon: Inda Castle, MD;  Location: [facility];  Service: Endoscopy;  Laterality: N/A;   ESOPHAGOGASTRODUODENOSCOPY (EGD) WITH PROPOFOL N/A  09/20/2018   Procedure: ESOPHAGOGASTRODUODENOSCOPY (EGD) WITH PROPOFOL;  Surgeon: Rush Landmark Telford Nab., MD;  Location: [place];  Service: Gastroenterology;  Laterality: N/A;   ESOPHAGOGASTRODUODENOSCOPY (EGD) WITH PROPOFOL N/A 01/31/2019   Procedure: ESOPHAGOGASTRODUODENOSCOPY (EGD) WITH PROPOFOL;  Surgeon: Rush Landmark Telford Nab., MD;  Location: [place];  Service: Gastroenterology;  Laterality: N/A;   ESOPHAGOGASTRODUODENOSCOPY (EGD) WITH PROPOFOL N/A 04/04/2020   Procedure: ESOPHAGOGASTRODUODENOSCOPY (EGD) WITH PROPOFOL;  Surgeon: Rush Landmark Telford Nab., MD;  Location: [facility];  Service: Gastroenterology;  Laterality: N/A;   EUS N/A 01/31/2019    Procedure: UPPER ENDOSCOPIC ULTRASOUND (EUS) RADIAL;  Surgeon: Irving Copas., MD;  Location: [place];  Service: Gastroenterology;  Laterality: N/A;   EUS N/A 04/04/2020   Procedure: UPPER ENDOSCOPIC ULTRASOUND (EUS) RADIAL;  Surgeon: Irving Copas., MD;  Location: [facility];  Service: Gastroenterology;  Laterality: N/A;   EXTERNAL EAR SURGERY  01/2016   BCC removal   EYE SURGERY  infant   "lazy eye"   FINE NEEDLE ASPIRATION  01/31/2019   Procedure: FINE NEEDLE ASPIRATION (FNA) LINEAR;  Surgeon: Irving Copas., MD;  Location: [facility];  Service: Gastroenterology;;   Otho Darner SIGMOIDOSCOPY  02/09/2012   Procedure: FLEXIBLE SIGMOIDOSCOPY;  Surgeon: Inda Castle, MD;  Location: [facility];  Service: Endoscopy;  Laterality: N/A;   HEMORRHOID BANDING  02/09/2012   Procedure: HEMORRHOID BANDING;  Surgeon: Inda Castle, MD;  Location: [facility];  Service: Endoscopy;  Laterality: N/A;   HEMOSTASIS CLIP PLACEMENT  09/20/2018   Procedure: HEMOSTASIS CLIP PLACEMENT;  Surgeon: Irving Copas., MD;  Location: [place];  Service: Gastroenterology;;   HEMOSTASIS CLIP PLACEMENT  01/31/2019   Procedure: HEMOSTASIS CLIP PLACEMENT;  Surgeon: Irving Copas., MD;  Location: [place];  Service: Gastroenterology;;   HEMOSTASIS CLIP PLACEMENT  04/04/2020   Procedure: HEMOSTASIS CLIP PLACEMENT;  Surgeon: Irving Copas., MD;  Location: [facility];  Service: Gastroenterology;;   HEMOSTASIS CONTROL  04/04/2020   Procedure: HEMOSTASIS CONTROL;  Surgeon: Irving Copas., MD;  Location: [facility];  Service: Gastroenterology;;  snare cautery   HOT HEMOSTASIS N/A 01/17/2013   Procedure: HOT HEMOSTASIS (ARGON PLASMA COAGULATION/BICAP);  Surgeon: Inda Castle, MD;  Location: [facility];  Service: Endoscopy;  Laterality: N/A;   KIDNEY STONE SURGERY     MULTIPLE TOOTH EXTRACTIONS     PARATHYROIDECTOMY  04/2008   POLYPECTOMY   09/20/2018   Procedure: POLYPECTOMY;  Surgeon: Mansouraty, Telford Nab., MD;  Location: [place];  Service: Gastroenterology;;   POLYPECTOMY  01/31/2019   Procedure: POLYPECTOMY;  Surgeon: Irving Copas., MD;  Location: [place];  Service: Gastroenterology;;   POLYPECTOMY  04/04/2020   Procedure: POLYPECTOMY;  Surgeon: Irving Copas., MD;  Location: [facility];  Service: Gastroenterology;;   POLYPECTOMY  07/19/2020   Procedure: POLYPECTOMY;  Surgeon: Jerene Bears, MD;  Location: [facility];  Service: Gastroenterology;;   Clide Deutscher  09/20/2018   Procedure: Clide Deutscher;  Surgeon: Irving Copas., MD;  Location: [place];  Service: Gastroenterology;;   Clide Deutscher  01/31/2019   Procedure: Clide Deutscher;  Surgeon: Irving Copas., MD;  Location: [place];  Service: Gastroenterology;;   SUBMUCOSAL INJECTION  04/04/2020   Procedure: SUBMUCOSAL INJECTION;  Surgeon: Irving Copas., MD;  Location: [facility];  Service: Gastroenterology;;  EPI Injection   TONSILLECTOMY      Short Social History:  Social History   Tobacco Use   Smoking status: Former    Packs/day: 0.50    Years: 25.00    Pack years:  12.50    Types: Cigarettes    Quit date: 11/30/1981    Years since quitting: 38.9   Smokeless tobacco: Never  Substance Use Topics   Alcohol use: Yes    Alcohol/week: 7.0 standard drinks    Types: 7 Glasses of wine per week    Comment: occasional beer with dinner    Allergies  Allergen Reactions   Cucumber Extract Nausea And Vomiting   Sulfonamide Derivatives Other (See Comments)    Unknown reaction    Current Outpatient Medications  Medication Sig Dispense Refill   apixaban (ELIQUIS) 5 MG TABS tablet Take 1 tablet by mouth twice daily 180 tablet 1   cholecalciferol (VITAMIN D) 1000 UNITS tablet Take 1,000 Units by mouth every evening.      ferrous sulfate 325 (65 FE) MG tablet Take 325 mg by mouth daily at 12 noon.      flecainide (TAMBOCOR) 100 MG tablet Take 1 tablet (100 mg total) by mouth every evening. 90 tablet 1   flecainide (TAMBOCOR) 50 MG tablet TAKE 1 TABLET BY MOUTH ONCE DAILY IN THE MORNING 90 tablet 1   metoprolol succinate (TOPROL-XL) 50 MG 24 hr tablet Take 1 tablet by mouth once daily 90 tablet 1   Multiple Vitamin (MULTIVITAMIN WITH MINERALS) TABS tablet Take 1 tablet by mouth daily at 12 noon.     Multiple Vitamins-Minerals (PRESERVISION AREDS 2 PO) Take 1 tablet by mouth every evening.     NIFEdipine (PROCARDIA XL/NIFEDICAL XL) 60 MG 24 hr tablet Take 1 tablet (60 mg total) by mouth daily. 90 tablet 1   omeprazole (PRILOSEC) 40 MG capsule Take 1 capsule (40 mg total) by mouth 2 (two) times daily before a meal. 180 capsule 3   simvastatin (ZOCOR) 40 MG tablet Take 1 tablet (40 mg total) by mouth at bedtime. 90 tablet 1   No current facility-administered medications for this visit.    Review of Systems  Constitutional:  Constitutional negative. HENT: HENT negative.  Eyes: Eyes negative.  Respiratory: Respiratory negative.  Cardiovascular: Positive for leg swelling.  GI: Gastrointestinal negative.  Musculoskeletal: Positive for leg pain.  Neurological: Neurological negative. Hematologic: Hematologic/lymphatic negative.  Psychiatric: Psychiatric negative.       Objective:  Objective   Vitals:   11/07/20 1100  BP: (!) 146/85  Pulse: 69  Resp: 20  Temp: 98.2 F (36.8 C)  SpO2: 94%  Weight: 206 lb (93.4 kg)  Height: '5\' 8"'$  (1.727 m)   Body mass index is 31.32 kg/m.  Physical Exam HENT:     Head: Normocephalic.     Nose:     Comments: Wearing a mask Eyes:     Pupils: Pupils are equal, round, and reactive to light.  Cardiovascular:     Rate and Rhythm: Normal rate.     Pulses: Normal pulses.  Pulmonary:     Effort: Pulmonary effort is normal.  Abdominal:     General: Abdomen is flat.  Musculoskeletal:     Cervical back: Normal range of motion and neck supple.      Comments: Multiple varicosities of the entire right lower extremity as well as telangiectasias Swelling of the left lower extremity from the knee to the foot.  There are multiple varicosities on the medial thigh of the left  Skin:    Capillary Refill: Capillary refill takes less than 2 seconds.  Neurological:     General: No focal deficit present.     Mental Status: He is alert.  Psychiatric:  Mood and Affect: Mood normal.        Behavior: Behavior normal.        Thought Content: Thought content normal.        Judgment: Judgment normal.    Data: Venous Reflux Times  +-----------------+---------+------+-----------+------------+--------+  RIGHT            Reflux NoRefluxReflux TimeDiameter cmsComments                             Yes                                   +-----------------+---------+------+-----------+------------+--------+  CFV              no                                              +-----------------+---------+------+-----------+------------+--------+  FV prox          no                                              +-----------------+---------+------+-----------+------------+--------+  FV mid           no                                              +-----------------+---------+------+-----------+------------+--------+  FV dist          no                                              +-----------------+---------+------+-----------+------------+--------+  Popliteal        no                                              +-----------------+---------+------+-----------+------------+--------+  GSV at [facility]       no                           0.785              +-----------------+---------+------+-----------+------------+--------+  GSV prox thigh   no                           0.730              +-----------------+---------+------+-----------+------------+--------+  GSV mid thigh              yes     >500 ms     0.648              +-----------------+---------+------+-----------+------------+--------+  GSV dist thigh             yes    >500 ms     0.561              +-----------------+---------+------+-----------+------------+--------+  GSV at knee                yes    >500 ms     0.444              +-----------------+---------+------+-----------+------------+--------+  GSV prox calf                                 0.580              +-----------------+---------+------+-----------+------------+--------+  GSV mid calf                                  0.466              +-----------------+---------+------+-----------+------------+--------+  SSV Pop Fossa    no                           0.329              +-----------------+---------+------+-----------+------------+--------+  SSV prox calf    no                           0.399              +-----------------+---------+------+-----------+------------+--------+  SSV mid calf                                  0.288              +-----------------+---------+------+-----------+------------+--------+  SFJ                        yes    >500 ms      1.06              +-----------------+---------+------+-----------+------------+--------+  Prox thigh                 yes    >500 ms     0.780              +-----------------+---------+------+-----------+------------+--------+  Prox to mid thigh          yes    >500 ms     0.667              +-----------------+---------+------+-----------+------------+--------+      +-----------------+---------+------+-----------+------------+--------------  ----+  LEFT             Reflux NoRefluxReflux TimeDiameter cmsComments                                         Yes                                               +-----------------+---------+------+-----------+------------+--------------  ----+  CFV              no                                                           +-----------------+---------+------+-----------+------------+--------------  ----+  FV prox          no                                                          +-----------------+---------+------+-----------+------------+--------------  ----+  FV mid           no                                                          +-----------------+---------+------+-----------+------------+--------------  ----+  FV dist          no                                                          +-----------------+---------+------+-----------+------------+--------------  ----+  Popliteal        no                                                          +-----------------+---------+------+-----------+------------+--------------  ----+  GSV at [facility]       no                           0.478                          +-----------------+---------+------+-----------+------------+--------------  ----+  GSV prox thigh                                         Not  identified                                                             possibly  treated    +-----------------+---------+------+-----------+------------+--------------  ----+  GSV mid thigh                                          Not  identified                                                             possibly  treated    +-----------------+---------+------+-----------+------------+--------------  ----+  GSV dist thigh  Not  identified                                                             possibly  treated    +-----------------+---------+------+-----------+------------+--------------  ----+  GSV at knee                                            Not  identified                                                             possibly  treated     +-----------------+---------+------+-----------+------------+--------------  ----+  GSV prox calf                                 0.205                          +-----------------+---------+------+-----------+------------+--------------  ----+  GSV mid calf                                  0.190                          +-----------------+---------+------+-----------+------------+--------------  ----+  SSV Pop Fossa    no                           0.383                          +-----------------+---------+------+-----------+------------+--------------  ----+  SSV prox calf    no                           0.207                          +-----------------+---------+------+-----------+------------+--------------  ----+  SSV mid calf                                  0.340                          +-----------------+---------+------+-----------+------------+--------------  ----+  SFJ                        yes    >500 ms     0.903                          +-----------------+---------+------+-----------+------------+--------------  ----+  Prox thigh                 yes    >  500 ms     0.728                          +-----------------+---------+------+-----------+------------+--------------  ----+  Prox to mid thigh          yes    >500 ms     0.667                          +-----------------+---------+------+-----------+------------+--------------  ----+          Assessment/Plan:     86 year old male with bilateral lower extremity venous reflux with significant varicosities on the right but no real symptoms on the left side he does have swelling below the knee which appears to be multifactorial with reflux from an accessory saphenous vein I clearly feeds a large varicosity in the thigh and all he also appears to have lymphedema.  We will fit him for thigh-high stockings today and see him back in 3 months for further  evaluation.  I discussed with the need for diligent compliance with his compression putting it on prior to the exiting bed in the morning and not wearing during his sleep.  He should elevate his leg when he is resting.  We will see him back in 3 months to see if intervention would be of any help including ablation of his anterior accessory saphenous vein possible stab phlebectomy of large associated varicosity.     Waynetta Sandy MD [facility]

## 2020-11-24 ENCOUNTER — Other Ambulatory Visit: Payer: Self-pay | Admitting: Internal Medicine

## 2020-11-24 DIAGNOSIS — I10 Essential (primary) hypertension: Secondary | ICD-10-CM

## 2020-11-27 ENCOUNTER — Other Ambulatory Visit: Payer: Self-pay | Admitting: Internal Medicine

## 2021-01-03 ENCOUNTER — Institutional Professional Consult (permissible substitution): Payer: PPO | Admitting: Plastic Surgery

## 2021-02-13 ENCOUNTER — Encounter: Payer: Self-pay | Admitting: Vascular Surgery

## 2021-02-13 ENCOUNTER — Other Ambulatory Visit: Payer: Self-pay

## 2021-02-13 ENCOUNTER — Ambulatory Visit: Payer: PPO | Admitting: Vascular Surgery

## 2021-02-13 VITALS — BP 178/94 | HR 62 | Temp 97.9°F | Resp 20 | Ht 68.0 in | Wt 210.0 lb

## 2021-02-13 DIAGNOSIS — I872 Venous insufficiency (chronic) (peripheral): Secondary | ICD-10-CM | POA: Diagnosis not present

## 2021-02-13 DIAGNOSIS — I890 Lymphedema, not elsewhere classified: Secondary | ICD-10-CM

## 2021-02-13 NOTE — Progress Notes (Signed)
Patient ID: Ronald [name], male   DOB: [birth year], 86 y.o.   MRN: [id number]  Reason for Consult: Follow-up   Referred by Colon Branch, MD  Subjective:     HPI:  Ronald [name] is a 86 y.o. male with previous history of left lower extremity venous intervention but nothing on the right.  He was evaluated just over 3 months ago with varicosities of the right greater than the left lower extremity but without bleeding or thrombosis.  He does have multiple varicosities extending up to his left thigh these were quite uncomfortable for him.  He was subsequently prescribed thigh-high stockings which she has been quite consistent in wearing.  He did have a known accessory saphenous vein at last visit but also had a component of lymphedema.  He does not have a tissue loss or ulceration.  Past Medical History:  Diagnosis Date   Arthritis    one finger   BPH (benign prostatic hypertrophy)    f/u @ Biopsy, s/p bx 2009 apro (-)   Cataract    Colon polyp    Complication of anesthesia    violent upon waking up (only 1 time)    Diverticulosis    Dysrhythmia    A-flutter/Afib   Early cataract    left   Edema leg    asymetric , L > R , [place] neg for DVT 09-2010   Gastric polyps    GERD (gastroesophageal reflux disease)    Hemorrhoids    History of kidney stones    Hypercalcemia    parathyroidectomy 04/2008   Hyperlipidemia    Hypertension    Iron deficiency anemia    Polyp, stomach    Pre-diabetes    Wears dentures    Wears glasses    Family History  Problem Relation Age of Onset   Heart attack Father 82   Heart attack Mother    Pneumonia Sister 6   Colon cancer Neg Hx    Prostate cancer Neg Hx    Diabetes Neg Hx    Past Surgical History:  Procedure Laterality Date   A-FLUTTER ABLATION N/A 08/12/2017   Procedure: A-FLUTTER ABLATION;  Surgeon: Deboraha Sprang, MD;  Location: [place] [facility];  Service: Cardiovascular;  Laterality: N/A;   APPENDECTOMY     BIOPSY  09/20/2018   Procedure:  BIOPSY;  Surgeon: Rush Landmark Telford Nab., MD;  Location: [place];  Service: Gastroenterology;;   CATARACT EXTRACTION Right    COLONOSCOPY  12/16/2011   Procedure: COLONOSCOPY;  Surgeon: Inda Castle, MD;  [facility];  Service: Endoscopy;  Laterality: N/A;   COLONOSCOPY WITH PROPOFOL N/A 07/19/2020   Procedure: COLONOSCOPY WITH PROPOFOL;  Surgeon: Jerene Bears, MD;  [facility];  Service: Gastroenterology;  Laterality: N/A;   ENDOSCOPIC MUCOSAL RESECTION N/A 09/20/2018   Procedure: ENDOSCOPIC MUCOSAL RESECTION;  Surgeon: Rush Landmark Telford Nab., MD;  Location: [place];  Service: Gastroenterology;  Laterality: N/A;   ENDOSCOPIC MUCOSAL RESECTION  01/31/2019   Procedure: ENDOSCOPIC MUCOSAL RESECTION;  Surgeon: Rush Landmark Telford Nab., MD;  Location: [facility];  Service: Gastroenterology;;   ESOPHAGOGASTRODUODENOSCOPY N/A 01/17/2013   Procedure: ESOPHAGOGASTRODUODENOSCOPY (EGD);  Surgeon: Inda Castle, MD;  Location: [facility];  Service: Endoscopy;  Laterality: N/A;   ESOPHAGOGASTRODUODENOSCOPY (EGD) WITH PROPOFOL N/A 09/20/2018   Procedure: ESOPHAGOGASTRODUODENOSCOPY (EGD) WITH PROPOFOL;  Surgeon: Rush Landmark Telford Nab., MD;  Location: [place];  Service: Gastroenterology;  Laterality: N/A;   ESOPHAGOGASTRODUODENOSCOPY (EGD) WITH PROPOFOL N/A 01/31/2019  Procedure: ESOPHAGOGASTRODUODENOSCOPY (EGD) WITH PROPOFOL;  Surgeon: Rush Landmark Telford Nab., MD;  Location: [place];  Service: Gastroenterology;  Laterality: N/A;   ESOPHAGOGASTRODUODENOSCOPY (EGD) WITH PROPOFOL N/A 04/04/2020   Procedure: ESOPHAGOGASTRODUODENOSCOPY (EGD) WITH PROPOFOL;  Surgeon: Rush Landmark Telford Nab., MD;  [facility];  Service: Gastroenterology;  Laterality: N/A;   EUS N/A 01/31/2019   Procedure: UPPER ENDOSCOPIC ULTRASOUND (EUS) RADIAL;  Surgeon: Irving Copas., MD;  Location: [place];  Service: Gastroenterology;  Laterality: N/A;   EUS N/A 04/04/2020    Procedure: UPPER ENDOSCOPIC ULTRASOUND (EUS) RADIAL;  Surgeon: Irving Copas., MD;  [facility];  Service: Gastroenterology;  Laterality: N/A;   EXTERNAL EAR SURGERY  01/2016   BCC removal   EYE SURGERY  infant   "lazy eye"   FINE NEEDLE ASPIRATION  01/31/2019   Procedure: FINE NEEDLE ASPIRATION (FNA) LINEAR;  Surgeon: Irving Copas., MD;  Location: [facility];  Service: Gastroenterology;;   Otho Darner SIGMOIDOSCOPY  02/09/2012   Procedure: FLEXIBLE SIGMOIDOSCOPY;  Surgeon: Inda Castle, MD;  [facility];  Service: Endoscopy;  Laterality: N/A;   HEMORRHOID BANDING  02/09/2012   Procedure: HEMORRHOID BANDING;  Surgeon: Inda Castle, MD;  [facility];  Service: Endoscopy;  Laterality: N/A;   HEMOSTASIS CLIP PLACEMENT  09/20/2018   Procedure: HEMOSTASIS CLIP PLACEMENT;  Surgeon: Irving Copas., MD;  Location: [place];  Service: Gastroenterology;;   HEMOSTASIS CLIP PLACEMENT  01/31/2019   Procedure: HEMOSTASIS CLIP PLACEMENT;  Surgeon: Irving Copas., MD;  Location: [place];  Service: Gastroenterology;;   HEMOSTASIS CLIP PLACEMENT  04/04/2020   Procedure: HEMOSTASIS CLIP PLACEMENT;  Surgeon: Irving Copas., MD;  Location: [facility];  Service: Gastroenterology;;   HEMOSTASIS CONTROL  04/04/2020   Procedure: HEMOSTASIS CONTROL;  Surgeon: Irving Copas., MD;  [facility];  Service: Gastroenterology;;  snare cautery   HOT HEMOSTASIS N/A 01/17/2013   Procedure: HOT HEMOSTASIS (ARGON PLASMA COAGULATION/BICAP);  Surgeon: Inda Castle, MD;  Location: [facility];  Service: Endoscopy;  Laterality: N/A;   KIDNEY STONE SURGERY     MULTIPLE TOOTH EXTRACTIONS     PARATHYROIDECTOMY  04/2008   POLYPECTOMY  09/20/2018   Procedure: POLYPECTOMY;  Surgeon: Mansouraty, Telford Nab., MD;  Location: [place];  Service: Gastroenterology;;   POLYPECTOMY  01/31/2019   Procedure: POLYPECTOMY;   Surgeon: Irving Copas., MD;  Location: [place];  Service: Gastroenterology;;   POLYPECTOMY  04/04/2020   Procedure: POLYPECTOMY;  Surgeon: Irving Copas., MD;  [facility];  Service: Gastroenterology;;   POLYPECTOMY  07/19/2020   Procedure: POLYPECTOMY;  Surgeon: Jerene Bears, MD;  [facility];  Service: Gastroenterology;;   Clide Deutscher  09/20/2018   Procedure: Clide Deutscher;  Surgeon: Irving Copas., MD;  Location: [place];  Service: Gastroenterology;;   Clide Deutscher  01/31/2019   Procedure: Clide Deutscher;  Surgeon: Irving Copas., MD;  Location: [place];  Service: Gastroenterology;;   SUBMUCOSAL INJECTION  04/04/2020   Procedure: SUBMUCOSAL INJECTION;  Surgeon: Irving Copas., MD;  Location: [facility];  Service: Gastroenterology;;  EPI Injection   TONSILLECTOMY      Short Social History:  Social History   Tobacco Use   Smoking status: Former    Packs/day: 0.50    Years: 25.00    Pack years: 12.50    Types: Cigarettes    Quit date: 11/30/1981    Years since quitting: 39.2   Smokeless tobacco: Never  Substance Use Topics   Alcohol use: Yes  Alcohol/week: 7.0 standard drinks    Types: 7 Glasses of wine per week    Comment: occasional beer with dinner    Allergies  Allergen Reactions   Cucumber Extract Nausea And Vomiting   Sulfonamide Derivatives Other (See Comments)    Unknown reaction    Current Outpatient Medications  Medication Sig Dispense Refill   apixaban (ELIQUIS) 5 MG TABS tablet Take 1 tablet by mouth twice daily 180 tablet 1   cholecalciferol (VITAMIN D) 1000 UNITS tablet Take 1,000 Units by mouth every evening.      ferrous sulfate 325 (65 FE) MG tablet Take 325 mg by mouth daily at 12 noon.     flecainide (TAMBOCOR) 100 MG tablet Take 1 tablet (100 mg total) by mouth every evening. 90 tablet 1   flecainide (TAMBOCOR) 50 MG tablet TAKE 1 TABLET BY MOUTH ONCE DAILY IN THE  MORNING 90 tablet 1   metoprolol succinate (TOPROL-XL) 50 MG 24 hr tablet Take 1 tablet by mouth once daily 90 tablet 1   Multiple Vitamin (MULTIVITAMIN WITH MINERALS) TABS tablet Take 1 tablet by mouth daily at 12 noon.     Multiple Vitamins-Minerals (PRESERVISION AREDS 2 PO) Take 1 tablet by mouth every evening.     NIFEdipine (PROCARDIA XL/NIFEDICAL XL) 60 MG 24 hr tablet Take 1 tablet by mouth once daily 90 tablet 1   omeprazole (PRILOSEC) 40 MG capsule Take 1 capsule (40 mg total) by mouth 2 (two) times daily before a meal. 180 capsule 3   simvastatin (ZOCOR) 40 MG tablet TAKE 1 TABLET BY MOUTH AT BEDTIME 90 tablet 1   No current facility-administered medications for this visit.    Review of Systems  Constitutional:  Constitutional negative. HENT: HENT negative.  Eyes: Eyes negative.  Cardiovascular: Positive for leg swelling.  GI: Gastrointestinal negative.  Musculoskeletal: Musculoskeletal negative.  Skin: Skin negative.  Neurological: Neurological negative. Hematologic: Hematologic/lymphatic negative.  Psychiatric: Psychiatric negative.       Objective:  Objective   There were no vitals filed for this visit. There is no height or weight on file to calculate BMI.  Physical Exam HENT:     Head: Normocephalic.     Nose:     Comments: Wearing a mask Eyes:     Pupils: Pupils are equal, round, and reactive to light.  Cardiovascular:     Rate and Rhythm: Normal rate.     Pulses: Normal pulses.  Pulmonary:     Effort: Pulmonary effort is normal.  Abdominal:     General: Abdomen is flat.     Palpations: Abdomen is soft.  Musculoskeletal:     Cervical back: Normal range of motion.     Right lower leg: Edema present.     Left lower leg: Edema present.     Comments: Varicosities as below  Skin:    General: Skin is warm.     Capillary Refill: Capillary refill takes less than 2 seconds.  Neurological:     General: No focal deficit present.     Mental Status: He is  alert and oriented to person, place, and time.  Psychiatric:        Mood and Affect: Mood normal.        Behavior: Behavior normal.           Assessment/Plan:     86 year old male with bilateral lower extremity C3 edema with bilateral extensive varicosities actually right greater than left but swelling on the left greater than right which is  his chief complaint.  He has been compliant with thigh-high compression stockings for 3 months.  He has a refluxing anterior excessively vein on the left and a greater saphenous vein on the right.  Previous greater saphenous vein was treated on the left side.  I discussed with him the options being continued compression stockings versus consideration of laser ablation and stab phlebectomy.  At this time patient wants to continue compression stockings and he will call if he has further issues.  All of his questions were answered today.     Waynetta Sandy MD [facility]

## 2021-02-20 ENCOUNTER — Other Ambulatory Visit: Payer: Self-pay | Admitting: Internal Medicine

## 2021-02-20 MED ORDER — FLECAINIDE ACETATE 100 MG PO TABS: 100.0000 mg | ORAL_TABLET | Freq: Every evening | ORAL | 1 refills | Status: AC

## 2021-02-27 ENCOUNTER — Other Ambulatory Visit: Payer: Self-pay | Admitting: Internal Medicine

## 2021-03-12 ENCOUNTER — Encounter: Payer: PPO | Admitting: Internal Medicine

## 2021-03-20 ENCOUNTER — Other Ambulatory Visit: Payer: Self-pay | Admitting: Internal Medicine

## 2021-03-20 NOTE — Telephone Encounter (Signed)
Prescription refill request for Eliquis received. Indication: afib  Last office visit: klein, 09/06/2020 Scr: 1.16, 08/01/2020 Age: 87 yo  Weight: 95.3 kg   Refill sent.

## 2021-04-26 ENCOUNTER — Telehealth: Payer: Self-pay

## 2021-04-26 NOTE — Telephone Encounter (Signed)
Dr Rush Landmark please see note from 04/09/20-" tenative EGD EUS"  Do you want him to be scheduled at this time?    04/09/2020  4:22 AM EST     Cachet Mccutchen, I have sent the result letter to you.   Please set up a CBC/iron/TIBC/ferritin for 8 weeks from now.  These labs can go to Dr. Tarri Glenn in case the patient needs more IV iron although he has not required any iron in quite a while. I would like the patient to undergo a CT abdomen with IV and oral contrast.  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  This can be put under my name. Tentative EGD/EUS in 1 year. Thanks. GM

## 2021-04-26 NOTE — Telephone Encounter (Signed)
-----   Message from Timothy Lasso, RN sent at 04/23/2021  8:40 AM EST -----  ----- Message ----- From: Timothy Lasso, RN Sent: 04/23/2021   8:39 AM EST To: Timothy Lasso, RN   ----- Message ----- From: Timothy Lasso, RN Sent: 04/23/2021  12:00 AM EST To: Timothy Lasso, RN   ( Recall entered )I will tentatively plan an endoscopic ultrasound in 2023 (1 year from now).    He has a pancreatic cyst that I would follow-up with an MRI/MRCP so he should have that done a few weeks before my endoscopic ultrasound.

## 2021-04-29 ENCOUNTER — Other Ambulatory Visit: Payer: Self-pay

## 2021-04-29 ENCOUNTER — Encounter: Payer: Self-pay | Admitting: Internal Medicine

## 2021-04-29 ENCOUNTER — Ambulatory Visit (INDEPENDENT_AMBULATORY_CARE_PROVIDER_SITE_OTHER): Payer: PPO | Admitting: Internal Medicine

## 2021-04-29 VITALS — BP 126/80 | HR 64 | Temp 97.9°F | Resp 16 | Ht 68.0 in | Wt 207.0 lb

## 2021-04-29 DIAGNOSIS — R739 Hyperglycemia, unspecified: Secondary | ICD-10-CM | POA: Diagnosis not present

## 2021-04-29 DIAGNOSIS — Z8509 Personal history of malignant neoplasm of other digestive organs: Secondary | ICD-10-CM

## 2021-04-29 DIAGNOSIS — E785 Hyperlipidemia, unspecified: Secondary | ICD-10-CM

## 2021-04-29 DIAGNOSIS — Z0000 Encounter for general adult medical examination without abnormal findings: Secondary | ICD-10-CM | POA: Diagnosis not present

## 2021-04-29 DIAGNOSIS — I10 Essential (primary) hypertension: Secondary | ICD-10-CM | POA: Diagnosis not present

## 2021-04-29 DIAGNOSIS — E1169 Type 2 diabetes mellitus with other specified complication: Secondary | ICD-10-CM

## 2021-04-29 DIAGNOSIS — D509 Iron deficiency anemia, unspecified: Secondary | ICD-10-CM

## 2021-04-29 LAB — COMPREHENSIVE METABOLIC PANEL WITH GFR
ALT: 16 U/L (ref 0–53)
AST: 13 U/L (ref 0–37)
Albumin: 3.7 g/dL (ref 3.5–5.2)
Alkaline Phosphatase: 70 U/L (ref 39–117)
BUN: 28 mg/dL — ABNORMAL HIGH (ref 6–23)
CO2: 36 meq/L — ABNORMAL HIGH (ref 19–32)
Calcium: 9 mg/dL (ref 8.4–10.5)
Chloride: 104 meq/L (ref 96–112)
Creatinine, Ser: 1.26 mg/dL (ref 0.40–1.50)
GFR: 52.3 mL/min — ABNORMAL LOW
Glucose, Bld: 108 mg/dL — ABNORMAL HIGH (ref 70–99)
Potassium: 4.5 meq/L (ref 3.5–5.1)
Sodium: 142 meq/L (ref 135–145)
Total Bilirubin: 0.7 mg/dL (ref 0.2–1.2)
Total Protein: 6.3 g/dL (ref 6.0–8.3)

## 2021-04-29 LAB — CBC WITH DIFFERENTIAL/PLATELET
Basophils Absolute: 0.1 K/uL (ref 0.0–0.1)
Basophils Relative: 0.6 % (ref 0.0–3.0)
Eosinophils Absolute: 0.2 K/uL (ref 0.0–0.7)
Eosinophils Relative: 2.1 % (ref 0.0–5.0)
HCT: 49.3 % (ref 39.0–52.0)
Hemoglobin: 16.1 g/dL (ref 13.0–17.0)
Lymphocytes Relative: 17.5 % (ref 12.0–46.0)
Lymphs Abs: 1.5 K/uL (ref 0.7–4.0)
MCHC: 32.7 g/dL (ref 30.0–36.0)
MCV: 92.8 fl (ref 78.0–100.0)
Monocytes Absolute: 0.6 K/uL (ref 0.1–1.0)
Monocytes Relative: 6.8 % (ref 3.0–12.0)
Neutro Abs: 6.1 K/uL (ref 1.4–7.7)
Neutrophils Relative %: 73 % (ref 43.0–77.0)
Platelets: 176 K/uL (ref 150.0–400.0)
RBC: 5.32 Mil/uL (ref 4.22–5.81)
RDW: 13.1 % (ref 11.5–15.5)
WBC: 8.3 K/uL (ref 4.0–10.5)

## 2021-04-29 LAB — LIPID PANEL
Cholesterol: 167 mg/dL (ref 0–200)
HDL: 42.4 mg/dL
LDL Cholesterol: 87 mg/dL (ref 0–99)
NonHDL: 125.06
Total CHOL/HDL Ratio: 4
Triglycerides: 191 mg/dL — ABNORMAL HIGH (ref 0.0–149.0)
VLDL: 38.2 mg/dL (ref 0.0–40.0)

## 2021-04-29 LAB — TSH: TSH: 3.09 u[IU]/mL (ref 0.35–5.50)

## 2021-04-29 LAB — HEMOGLOBIN A1C: Hgb A1c MFr Bld: 6.2 % (ref 4.6–6.5)

## 2021-04-29 NOTE — Patient Instructions (Signed)
Check the  blood pressure regularly BP GOAL is between 110/65 and  135/85. If it is consistently higher or lower, let me know   Consider getting the shingles vaccination  GO TO THE LAB : Get the blood work     [place], [place] back for a checkup in 6 months

## 2021-04-29 NOTE — Telephone Encounter (Signed)
Patient returned your call, please advise. 

## 2021-04-29 NOTE — Telephone Encounter (Signed)
The pt has been scheduled to see Dr Rush Landmark on 05/22/21 at 1:50 pm.  He will have labs a few days prior. The orders have been entered.

## 2021-04-29 NOTE — Telephone Encounter (Signed)
Left message on machine to call back  

## 2021-04-29 NOTE — Progress Notes (Signed)
Subjective:    Patient ID: Ronald [name], male    DOB: 09-May-1934, 87 y.o.   MRN: [id number]  DOS:  04/29/2021 Type of visit - description: cpx  Since the last office visit is doing well. Saw vascular surgery ref  lower extremity edema, note reviewed. From time to time has gross hematuria, denies urinary frequency, dysuria or difficulty going.  Review of Systems  Other than above, a 14 point review of systems is negative     Past Medical History:  Diagnosis Date   Arthritis    one finger   BPH (benign prostatic hypertrophy)    f/u @ Biopsy, s/p bx 2009 apro (-)   Cataract    Colon polyp    Complication of anesthesia    violent upon waking up (only 1 time)    Diverticulosis    Dysrhythmia    A-flutter/Afib   Early cataract    left   Edema leg    asymetric , L > R , [place] neg for DVT 09-2010   Gastric polyps    GERD (gastroesophageal reflux disease)    Hemorrhoids    History of kidney stones    Hypercalcemia    parathyroidectomy 04/2008   Hyperlipidemia    Hypertension    Iron deficiency anemia    Polyp, stomach    Pre-diabetes    Wears dentures    Wears glasses     Past Surgical History:  Procedure Laterality Date   A-FLUTTER ABLATION N/A 08/12/2017   Procedure: A-FLUTTER ABLATION;  Surgeon: Deboraha Sprang, MD;  Location: [place] [facility];  Service: Cardiovascular;  Laterality: N/A;   APPENDECTOMY     BIOPSY  09/20/2018   Procedure: BIOPSY;  Surgeon: Rush Landmark Telford Nab., MD;  Location: [place];  Service: Gastroenterology;;   CATARACT EXTRACTION Right    COLONOSCOPY  12/16/2011   Procedure: COLONOSCOPY;  Surgeon: Inda Castle, MD;  [facility];  Service: Endoscopy;  Laterality: N/A;   COLONOSCOPY WITH PROPOFOL N/A 07/19/2020   Procedure: COLONOSCOPY WITH PROPOFOL;  Surgeon: Jerene Bears, MD;  [facility];  Service: Gastroenterology;  Laterality: N/A;   ENDOSCOPIC MUCOSAL RESECTION N/A 09/20/2018   Procedure: ENDOSCOPIC MUCOSAL  RESECTION;  Surgeon: Rush Landmark Telford Nab., MD;  Location: [place];  Service: Gastroenterology;  Laterality: N/A;   ENDOSCOPIC MUCOSAL RESECTION  01/31/2019   Procedure: ENDOSCOPIC MUCOSAL RESECTION;  Surgeon: Rush Landmark Telford Nab., MD;  Location: [facility];  Service: Gastroenterology;;   ESOPHAGOGASTRODUODENOSCOPY N/A 01/17/2013   Procedure: ESOPHAGOGASTRODUODENOSCOPY (EGD);  Surgeon: Inda Castle, MD;  Location: [facility];  Service: Endoscopy;  Laterality: N/A;   ESOPHAGOGASTRODUODENOSCOPY (EGD) WITH PROPOFOL N/A 09/20/2018   Procedure: ESOPHAGOGASTRODUODENOSCOPY (EGD) WITH PROPOFOL;  Surgeon: Rush Landmark Telford Nab., MD;  Location: [place];  Service: Gastroenterology;  Laterality: N/A;   ESOPHAGOGASTRODUODENOSCOPY (EGD) WITH PROPOFOL N/A 01/31/2019   Procedure: ESOPHAGOGASTRODUODENOSCOPY (EGD) WITH PROPOFOL;  Surgeon: Rush Landmark Telford Nab., MD;  Location: [place];  Service: Gastroenterology;  Laterality: N/A;   ESOPHAGOGASTRODUODENOSCOPY (EGD) WITH PROPOFOL N/A 04/04/2020   Procedure: ESOPHAGOGASTRODUODENOSCOPY (EGD) WITH PROPOFOL;  Surgeon: Rush Landmark Telford Nab., MD;  [facility];  Service: Gastroenterology;  Laterality: N/A;   EUS N/A 01/31/2019   Procedure: UPPER ENDOSCOPIC ULTRASOUND (EUS) RADIAL;  Surgeon: Irving Copas., MD;  Location: [place];  Service: Gastroenterology;  Laterality: N/A;   EUS N/A 04/04/2020   Procedure: UPPER ENDOSCOPIC ULTRASOUND (EUS) RADIAL;  Surgeon: Irving Copas., MD;  [facility];  Service: Gastroenterology;  Laterality:  N/A;   EXTERNAL EAR SURGERY  01/2016   BCC removal   EYE SURGERY  infant   "lazy eye"   FINE NEEDLE ASPIRATION  01/31/2019   Procedure: FINE NEEDLE ASPIRATION (FNA) LINEAR;  Surgeon: Irving Copas., MD;  Location: [facility];  Service: Gastroenterology;;   Otho Darner SIGMOIDOSCOPY  02/09/2012   Procedure: FLEXIBLE SIGMOIDOSCOPY;  Surgeon: Inda Castle, MD;   [facility];  Service: Endoscopy;  Laterality: N/A;   HEMORRHOID BANDING  02/09/2012   Procedure: HEMORRHOID BANDING;  Surgeon: Inda Castle, MD;  [facility];  Service: Endoscopy;  Laterality: N/A;   HEMOSTASIS CLIP PLACEMENT  09/20/2018   Procedure: HEMOSTASIS CLIP PLACEMENT;  Surgeon: Irving Copas., MD;  Location: [place];  Service: Gastroenterology;;   HEMOSTASIS CLIP PLACEMENT  01/31/2019   Procedure: HEMOSTASIS CLIP PLACEMENT;  Surgeon: Irving Copas., MD;  Location: [place];  Service: Gastroenterology;;   HEMOSTASIS CLIP PLACEMENT  04/04/2020   Procedure: HEMOSTASIS CLIP PLACEMENT;  Surgeon: Irving Copas., MD;  Location: [facility];  Service: Gastroenterology;;   HEMOSTASIS CONTROL  04/04/2020   Procedure: HEMOSTASIS CONTROL;  Surgeon: Irving Copas., MD;  [facility];  Service: Gastroenterology;;  snare cautery   HOT HEMOSTASIS N/A 01/17/2013   Procedure: HOT HEMOSTASIS (ARGON PLASMA COAGULATION/BICAP);  Surgeon: Inda Castle, MD;  Location: [facility];  Service: Endoscopy;  Laterality: N/A;   KIDNEY STONE SURGERY     MULTIPLE TOOTH EXTRACTIONS     PARATHYROIDECTOMY  04/2008   POLYPECTOMY  09/20/2018   Procedure: POLYPECTOMY;  Surgeon: Mansouraty, Telford Nab., MD;  Location: [place];  Service: Gastroenterology;;   POLYPECTOMY  01/31/2019   Procedure: POLYPECTOMY;  Surgeon: Irving Copas., MD;  Location: [place];  Service: Gastroenterology;;   POLYPECTOMY  04/04/2020   Procedure: POLYPECTOMY;  Surgeon: Irving Copas., MD;  [facility];  Service: Gastroenterology;;   POLYPECTOMY  07/19/2020   Procedure: POLYPECTOMY;  Surgeon: Jerene Bears, MD;  Location: [facility];  Service: Gastroenterology;;   Clide Deutscher  09/20/2018   Procedure: Clide Deutscher;  Surgeon: Irving Copas., MD;  Location: [place];  Service: Gastroenterology;;   Clide Deutscher   01/31/2019   Procedure: Clide Deutscher;  Surgeon: Irving Copas., MD;  Location: [place];  Service: Gastroenterology;;   SUBMUCOSAL INJECTION  04/04/2020   Procedure: SUBMUCOSAL INJECTION;  Surgeon: Irving Copas., MD;  Location: [facility];  Service: Gastroenterology;;  EPI Injection   TONSILLECTOMY     Social History   Socioeconomic History   Marital status: Widowed    Spouse name: Not on file   Number of children: 3   Years of education: Not on file   Highest education level: Not on file  Occupational History   Occupation: retired   Tobacco Use   Smoking status: Former    Packs/day: 0.50    Years: 25.00    Pack years: 12.50    Types: Cigarettes    Quit date: 11/30/1981    Years since quitting: 39.4   Smokeless tobacco: Never  Vaping Use   Vaping Use: Never used  Substance and Sexual Activity   Alcohol use: Yes    Alcohol/week: 7.0 standard drinks    Types: 7 Glasses of wine per week    Comment: occasional beer with dinner   Drug use: No   Sexual activity: Not on file  Other Topics Concern   Not on file  Social History Narrative   Retired, single, has  a girlfriend ,  3 children  (one lives in town)   Daughter: she is the [employer]       Social Determinants of Radio broadcast assistant Strain: Not on file  Food Insecurity: Not on file  Transportation Needs: Not on file  Physical Activity: Not on file  Stress: Not on file  Social Connections: Not on file  Intimate Partner Violence: Not on file    Current Outpatient Medications  Medication Instructions   apixaban (ELIQUIS) 5 MG TABS tablet Take 1 tablet by mouth twice daily   cholecalciferol (VITAMIN D) 1,000 Units, Oral, Every evening   ferrous sulfate 325 mg, Oral, Daily   flecainide (TAMBOCOR) 50 MG tablet TAKE 1 TABLET BY MOUTH ONCE DAILY IN THE MORNING   flecainide (TAMBOCOR) 100 mg, Oral, Every evening   metoprolol succinate (TOPROL-XL) 50 MG 24 hr tablet Take 1 tablet by mouth once  daily   Multiple Vitamin (MULTIVITAMIN WITH MINERALS) TABS tablet 1 tablet, Oral, Daily   Multiple Vitamins-Minerals (PRESERVISION AREDS 2 PO) 1 tablet, Oral, Every evening   NIFEdipine (PROCARDIA XL/NIFEDICAL XL) 60 MG 24 hr tablet Take 1 tablet by mouth once daily   omeprazole (PRILOSEC) 40 mg, Oral, 2 times daily before meals   simvastatin (ZOCOR) 40 MG tablet TAKE 1 TABLET BY MOUTH AT BEDTIME       Objective:   Physical Exam BP 126/80 (BP Location: Left Arm, Patient Position: Sitting, Cuff Size: Small)    Pulse 64    Temp 97.9 F (36.6 C) (Oral)    Resp 16    Ht 5\' 8"  (1.727 m)    Wt 207 lb (93.9 kg)    SpO2 95%    BMI 31.47 kg/m  General: Well developed, NAD, BMI noted Neck: No  thyromegaly  HEENT:  Normocephalic . Face symmetric, atraumatic Lungs:  CTA B Normal respiratory effort, no intercostal retractions, no accessory muscle use. Heart: RRR,  no murmur.  Abdomen:  Not distended, soft, non-tender. No rebound or rigidity.   Lower extremities: ++/+++ lower extremity edema, worse on the left. Skin: Exposed areas without rash. Not pale. Not jaundice Neurologic:  alert & oriented X3.  Speech normal, gait appropriate for age and unassisted Strength symmetric and appropriate for age.  Psych: Cognition and judgment appear intact.  Cooperative with normal attention span and concentration.  Behavior appropriate. No anxious or depressed appearing.     Assessment     Assessment   DM HTN Hyperlipidemia Osteopenia T score -2.0 (2010), T score -1.6 (12-2014); T score -1.3 (03-2018). 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  PLAN  Here for CPX DM: Diet controlled, check A1c. HTN: On metoprolol, Procardia.  BP today is very good, no change.  Checking labs Hyperlipidemia: On simvastatin, checking labs LE edema L>R:  Has seen vascular surgery, for now they agreed continue compression stockings however patient states that he feels better without them. Atrial tachycardia, flutter: Anticoagulated.  Has occasionally gross hematuria Gross hematuria: Not a new issue, previously evaluated by urology, had CTs, cystoscopy 2021.  He also have elevated PSA.  He is not interested on see urology again.  Recommend to call if he changes his mind or symptoms increase or a different H/o anemia: check  labs, currently with no GI symptoms RTC 6 months   This visit occurred during the SARS-CoV-2 public health emergency.  Safety protocols were in place, including screening questions prior to the visit, additional usage of staff PPE, and extensive cleaning of exam room while observing appropriate contact time as indicated for disinfecting solutions.

## 2021-04-30 ENCOUNTER — Encounter: Payer: Self-pay | Admitting: Internal Medicine

## 2021-04-30 NOTE — Assessment & Plan Note (Signed)
Here for CPX DM: Diet controlled, check A1c. HTN: On metoprolol, Procardia.  BP today is very good, no change.  Checking labs Hyperlipidemia: On simvastatin, checking labs LE edema L>R: Has seen vascular surgery, for now they agreed continue compression stockings however patient states that he feels better without them. Atrial tachycardia, flutter: Anticoagulated.  Has occasionally gross hematuria Gross hematuria: Not a new issue, previously evaluated by urology, had CTs, cystoscopy 2021.  He also have elevated PSA.  He is not interested on see urology again.  Recommend to call if he changes his mind or symptoms increase or a different H/o anemia: check  labs, currently with no GI symptoms RTC 6 months

## 2021-04-30 NOTE — Assessment & Plan Note (Signed)
-   Td 09-2015 - Pneumonia shot: declined again  -s/p zostavax; shingrix discussed, pt is  considering  it   -COVID VAX: UTD - Had a flu shot -CCS: Multiple Cscopes last 07/2020, next per GI -Elevated  PSA: Last office visit with urology 2021, last PSA 12-2013:  15.5.  Not interested on further evaluation.  He understands meaning of elevated PSA and gross hematuria -Recommend has a healthy diet, he is inactive by choice, encouraged to take a walk regularly. -Labs: CMP, FLP, CBC, A1c, TSH -ACP on file

## 2021-05-20 ENCOUNTER — Other Ambulatory Visit (INDEPENDENT_AMBULATORY_CARE_PROVIDER_SITE_OTHER): Payer: PPO

## 2021-05-20 DIAGNOSIS — Z8509 Personal history of malignant neoplasm of other digestive organs: Secondary | ICD-10-CM | POA: Diagnosis not present

## 2021-05-20 DIAGNOSIS — D509 Iron deficiency anemia, unspecified: Secondary | ICD-10-CM

## 2021-05-20 LAB — CBC WITH DIFFERENTIAL/PLATELET
Basophils Absolute: 0 10*3/uL (ref 0.0–0.1)
Basophils Relative: 0.4 % (ref 0.0–3.0)
Eosinophils Absolute: 0.2 10*3/uL (ref 0.0–0.7)
Eosinophils Relative: 2 % (ref 0.0–5.0)
HCT: 46.2 % (ref 39.0–52.0)
Hemoglobin: 15.7 g/dL (ref 13.0–17.0)
Lymphocytes Relative: 22 % (ref 12.0–46.0)
Lymphs Abs: 1.9 10*3/uL (ref 0.7–4.0)
MCHC: 33.9 g/dL (ref 30.0–36.0)
MCV: 91.8 fl (ref 78.0–100.0)
Monocytes Absolute: 0.8 10*3/uL (ref 0.1–1.0)
Monocytes Relative: 9 % (ref 3.0–12.0)
Neutro Abs: 5.7 10*3/uL (ref 1.4–7.7)
Neutrophils Relative %: 66.6 % (ref 43.0–77.0)
Platelets: 180 10*3/uL (ref 150.0–400.0)
RBC: 5.04 Mil/uL (ref 4.22–5.81)
RDW: 13.3 % (ref 11.5–15.5)
WBC: 8.6 10*3/uL (ref 4.0–10.5)

## 2021-05-20 LAB — IBC + FERRITIN
Ferritin: 62.4 ng/mL (ref 22.0–322.0)
Iron: 88 ug/dL (ref 42–165)
Saturation Ratios: 30.4 % (ref 20.0–50.0)
TIBC: 289.8 ug/dL (ref 250.0–450.0)
Transferrin: 207 mg/dL — ABNORMAL LOW (ref 212.0–360.0)

## 2021-05-22 ENCOUNTER — Ambulatory Visit: Payer: PPO | Admitting: Gastroenterology

## 2021-05-22 ENCOUNTER — Encounter: Payer: Self-pay | Admitting: Gastroenterology

## 2021-05-22 VITALS — BP 120/80 | HR 70 | Ht 68.0 in | Wt 208.0 lb

## 2021-05-22 DIAGNOSIS — K317 Polyp of stomach and duodenum: Secondary | ICD-10-CM | POA: Diagnosis not present

## 2021-05-22 DIAGNOSIS — K3189 Other diseases of stomach and duodenum: Secondary | ICD-10-CM

## 2021-05-22 DIAGNOSIS — R933 Abnormal findings on diagnostic imaging of other parts of digestive tract: Secondary | ICD-10-CM | POA: Diagnosis not present

## 2021-05-22 DIAGNOSIS — Z8509 Personal history of malignant neoplasm of other digestive organs: Secondary | ICD-10-CM | POA: Diagnosis not present

## 2021-05-22 DIAGNOSIS — K862 Cyst of pancreas: Secondary | ICD-10-CM | POA: Diagnosis not present

## 2021-05-22 DIAGNOSIS — Z862 Personal history of diseases of the blood and blood-forming organs and certain disorders involving the immune mechanism: Secondary | ICD-10-CM

## 2021-05-22 NOTE — Progress Notes (Signed)
? ?GASTROENTEROLOGY OUTPATIENT CLINIC VISIT  ? ?Primary Care Provider ?Colon Branch, MD ?[address] ?[place] [address] ?[phone] ? ? ?Patient Profile: ?Ronald [name] is a 87 y.o. 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  The patient presents to the [facility] for an evaluation and management of problem(s) noted below: ? ?Problem List ?1. History of gastrointestinal stromal tumor (GIST)   ?2. Multiple gastric polyps   ?3. History of iron deficiency anemia   ?4. Pancreatic cyst   ?5. Abnormal endoscopy of upper gastrointestinal tract   ? ? ?History of Present Illness ?Please see prior notes for full details of HPI. ? ?Interval History ?The patient last saw me in January 2022 when we did his surveillance EUS.  His gastric GIST was stable.  He had a pancreatic cyst that was small.  He underwent a CT abdomen in an effort of trying to see if this gastric GIST could be monitored with imaging but it was not visible.  He did well.  Unfortunately in May he presented to the hospital with painless hematochezia and anemia.  He underwent a colonoscopy where a presumed diverticular hemorrhage was found.  He had some small adenomas that were removed.  The patient had recent blood work and he no longer has anemia or iron deficiency.  The patient states that he has been doing well.  He is not having any abdominal pain or nausea or vomiting.  He has not had any recurrence of any bleeding.  He is hopeful that endoscopic reevaluations may be less needed as he gets older.] ? ?GI Review of Systems ?Positive as above ?Negative for pyrosis, dysphagia, odynophagia, nausea, vomiting, pain, alteration of bowel habits ? ?Review of Systems ?General: Denies fevers/chills/weight loss  unintentionally ?Cardiovascular: Denies chest pain ?Pulmonary: Denies shortness of breath ?Gastroenterological: See HPI ?Genitourinary: Denies darkened urine or hematuria ?Hematological: Positive for history of easy bruising/bleeding due to anticoagulation ?Dermatological: Denies jaundice ?Psychological: Mood is stable ? ? ?Medications ?Current Outpatient Medications  ?Medication Sig Dispense Refill  ? apixaban (ELIQUIS) 5 MG TABS tablet Take 1 tablet by mouth twice daily 180 tablet 1  ? cholecalciferol (VITAMIN D) 1000 UNITS tablet Take 1,000 Units by mouth every evening.     ? ferrous sulfate 325 (65 FE) MG tablet Take 325 mg by mouth daily at 12 noon.    ? flecainide (TAMBOCOR) 100 MG tablet Take 1 tablet (100 mg total) by mouth every evening. 90 tablet 1  ? flecainide (TAMBOCOR) 50 MG tablet TAKE 1 TABLET BY MOUTH ONCE DAILY IN THE MORNING 90 tablet 1  ? metoprolol succinate (TOPROL-XL) 50 MG 24 hr tablet Take 1 tablet by mouth once daily 90 tablet 1  ? Multiple Vitamin (MULTIVITAMIN WITH MINERALS) TABS tablet Take 1 tablet by mouth daily at 12 noon.    ? Multiple Vitamins-Minerals (PRESERVISION AREDS 2 PO) Take 1 tablet by mouth every evening.    ? NIFEdipine (PROCARDIA XL/NIFEDICAL XL) 60 MG 24 hr tablet Take 1 tablet by mouth once daily 90 tablet 1  ? omeprazole (PRILOSEC) 40 MG capsule Take 1 capsule (40 mg total) by mouth 2 (two) times daily before a meal. 180 capsule 3  ? simvastatin (ZOCOR) 40 MG tablet TAKE 1 TABLET BY MOUTH AT BEDTIME 90 tablet 1  ? ?No current facility-administered  medications for this visit.  ? ? ?Allergies ?Allergies  ?Allergen Reactions  ? Cucumber Extract Nausea And Vomiting  ? Sulfonamide Derivatives Other (See Comments)  ?  Unknown reaction  ? ? ?Histories ?Past Medical History:  ?Diagnosis Date  ? Arthritis   ? one finger  ? BPH (benign prostatic hypertrophy)   ? f/u @ Biopsy, s/p bx 2009 apro (-)  ? Cataract   ? Colon polyp   ? Complication of anesthesia   ? violent upon waking  up (only 1 time)   ? Diverticulosis   ? Dysrhythmia   ? A-flutter/Afib  ? Early cataract   ? left  ? Edema leg   ? asymetric , L > R , [place] neg for DVT 09-2010  ? Gastric polyps   ? GERD (gastroesophageal reflux disease)   ? Hemorrhoids   ? History of kidney stones   ? Hypercalcemia   ? parathyroidectomy 04/2008  ? Hyperlipidemia   ? Hypertension   ? Iron deficiency anemia   ? Polyp, stomach   ? Pre-diabetes   ? Wears dentures   ? Wears glasses   ? ?Past Surgical History:  ?Procedure Laterality Date  ? A-FLUTTER ABLATION N/A 08/12/2017  ? Procedure: A-FLUTTER ABLATION;  Surgeon: Deboraha Sprang, MD;  Location: [place] [facility];  Service: Cardiovascular;  Laterality: N/A;  ? APPENDECTOMY    ? BIOPSY  09/20/2018  ? Procedure: BIOPSY;  Surgeon: Irving Copas., MD;  Location: [place];  Service: Gastroenterology;;  ? CATARACT EXTRACTION Right   ? COLONOSCOPY  12/16/2011  ? Procedure: COLONOSCOPY;  Surgeon: Inda Castle, MD;  Location: [facility];  Service: Endoscopy;  Laterality: N/A;  ? COLONOSCOPY WITH PROPOFOL N/A 07/19/2020  ? Procedure: COLONOSCOPY WITH PROPOFOL;  Surgeon: Jerene Bears, MD;  Location: [facility];  Service: Gastroenterology;  Laterality: N/A;  ? ENDOSCOPIC MUCOSAL RESECTION N/A 09/20/2018  ? Procedure: ENDOSCOPIC MUCOSAL RESECTION;  Surgeon: Rush Landmark Telford Nab., MD;  Location: [place];  Service: Gastroenterology;  Laterality: N/A;  ? ENDOSCOPIC MUCOSAL RESECTION  01/31/2019  ? Procedure: ENDOSCOPIC MUCOSAL RESECTION;  Surgeon: Rush Landmark Telford Nab., MD;  Location: [facility];  Service: Gastroenterology;;  ? ESOPHAGOGASTRODUODENOSCOPY N/A 01/17/2013  ? Procedure: ESOPHAGOGASTRODUODENOSCOPY (EGD);  Surgeon: Inda Castle, MD;  Location: [facility];  Service: Endoscopy;  Laterality: N/A;  ? ESOPHAGOGASTRODUODENOSCOPY (EGD) WITH PROPOFOL N/A 09/20/2018  ? Procedure: ESOPHAGOGASTRODUODENOSCOPY (EGD) WITH PROPOFOL;  Surgeon: Rush Landmark Telford Nab., MD;  Location: [place];  Service: Gastroenterology;  Laterality: N/A;  ? ESOPHAGOGASTRODUODENOSCOPY (EGD) WITH PROPOFOL N/A 01/31/2019  ? Procedure: ESOPHAGOGASTRODUODENOSCOPY (EGD) WITH PROPOFOL;  Surgeon: Rush Landmark Telford Nab., MD;  Location: [place];  Service: Gastroenterology;  Laterality: N/A;  ? ESOPHAGOGASTRODUODENOSCOPY (EGD) WITH PROPOFOL N/A 04/04/2020  ? Procedure: ESOPHAGOGASTRODUODENOSCOPY (EGD) WITH PROPOFOL;  Surgeon: Rush Landmark Telford Nab., MD;  Location: [facility];  Service: Gastroenterology;  Laterality: N/A;  ? EUS N/A 01/31/2019  ? Procedure: UPPER ENDOSCOPIC ULTRASOUND (EUS) RADIAL;  Surgeon: Rush Landmark Telford Nab., MD;  Location: [place];  Service: Gastroenterology;  Laterality: N/A;  ? EUS N/A 04/04/2020  ? Procedure: UPPER ENDOSCOPIC ULTRASOUND (EUS) RADIAL;  Surgeon: Rush Landmark Telford Nab., MD;  Location: [facility];  Service: Gastroenterology;  Laterality: N/A;  ? EXTERNAL EAR SURGERY  01/2016  ? BCC removal  ? EYE SURGERY  infant  ? "lazy eye"  ? FINE NEEDLE ASPIRATION  01/31/2019  ? Procedure: FINE NEEDLE ASPIRATION (FNA) LINEAR;  Surgeon: Irving Copas., MD;  Location: [place];  Service: Gastroenterology;;  ?  FLEXIBLE SIGMOIDOSCOPY  02/09/2012  ? Procedure: FLEXIBLE SIGMOIDOSCOPY;  Surgeon: Inda Castle, MD;  Location: [facility];  Service: Endoscopy;  Laterality: N/A;  ? HEMORRHOID BANDING  02/09/2012  ? Procedure: HEMORRHOID BANDING;  Surgeon: Inda Castle, MD;  Location: [facility];  Service: Endoscopy;  Laterality: N/A;  ? HEMOSTASIS CLIP PLACEMENT  09/20/2018  ? Procedure: HEMOSTASIS CLIP PLACEMENT;  Surgeon: Irving Copas., MD;  Location: [place];  Service: Gastroenterology;;  ? HEMOSTASIS CLIP PLACEMENT  01/31/2019  ? Procedure: HEMOSTASIS CLIP PLACEMENT;  Surgeon: Irving Copas., MD;  Location: [place];  Service: Gastroenterology;;  ? HEMOSTASIS CLIP PLACEMENT  04/04/2020  ? Procedure: HEMOSTASIS CLIP PLACEMENT;  Surgeon:  Irving Copas., MD;  Location: [facility];  Service: Gastroenterology;;  ? HEMOSTASIS CONTROL  04/04/2020  ? Procedure: HEMOSTASIS CONTROL;  Surgeon: Rush Landmark Telford Nab., MD;  Location: [facility];  Service:

## 2021-05-22 NOTE — Patient Instructions (Signed)
You have been scheduled for an endoscopy. Please follow written instructions given to you at your visit today. ?If you use inhalers (even only as needed), please bring them with you on the day of your procedure. ? ?If you are age 85 or older, your body mass index should be between 23-30. Your Body mass index is 31.63 kg/m?Marland Kitchen If this is out of the aforementioned range listed, please consider follow up with your Primary Care Provider. ? ?If you are age 55 or younger, your body mass index should be between 19-25. Your Body mass index is 31.63 kg/m?Marland Kitchen If this is out of the aformentioned range listed, please consider follow up with your Primary Care Provider.  ? ?________________________________________________________ ? ?The [place] [facility] providers would like to encourage you to use [facility] to communicate with providers for non-urgent requests or questions.  Due to long hold times on the telephone, sending your provider a message by [facility] may be a faster and more efficient way to get a response.  Please allow 48 business hours for a response.  Please remember that this is for non-urgent requests.  ?_______________________________________________________ ? ?Thank you for choosing me and [facility]. ? ?Dr. Rush Landmark ? ?

## 2021-05-22 NOTE — H&P (View-Only) (Signed)
? ?GASTROENTEROLOGY OUTPATIENT CLINIC VISIT  ? ?Primary Care Provider ?Colon Branch, MD ?[address] ?[place] [address] ?[phone] ? ? ?Patient Profile: ?Ronald [name] is a 87 y.o. 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  The patient presents to the [facility] for an evaluation and management of problem(s) noted below: ? ?Problem List ?1. History of gastrointestinal stromal tumor (GIST)   ?2. Multiple gastric polyps   ?3. History of iron deficiency anemia   ?4. Pancreatic cyst   ?5. Abnormal endoscopy of upper gastrointestinal tract   ? ? ?History of Present Illness ?Please see prior notes for full details of HPI. ? ?Interval History ?The patient last saw me in January 2022 when we did his surveillance EUS.  His gastric GIST was stable.  He had a pancreatic cyst that was small.  He underwent a CT abdomen in an effort of trying to see if this gastric GIST could be monitored with imaging but it was not visible.  He did well.  Unfortunately in May he presented to the hospital with painless hematochezia and anemia.  He underwent a colonoscopy where a presumed diverticular hemorrhage was found.  He had some small adenomas that were removed.  The patient had recent blood work and he no longer has anemia or iron deficiency.  The patient states that he has been doing well.  He is not having any abdominal pain or nausea or vomiting.  He has not had any recurrence of any bleeding.  He is hopeful that endoscopic reevaluations may be less needed as he gets older.] ? ?GI Review of Systems ?Positive as above ?Negative for pyrosis, dysphagia, odynophagia, nausea, vomiting, pain, alteration of bowel habits ? ?Review of Systems ?General: Denies fevers/chills/weight loss  unintentionally ?Cardiovascular: Denies chest pain ?Pulmonary: Denies shortness of breath ?Gastroenterological: See HPI ?Genitourinary: Denies darkened urine or hematuria ?Hematological: Positive for history of easy bruising/bleeding due to anticoagulation ?Dermatological: Denies jaundice ?Psychological: Mood is stable ? ? ?Medications ?Current Outpatient Medications  ?Medication Sig Dispense Refill  ? apixaban (ELIQUIS) 5 MG TABS tablet Take 1 tablet by mouth twice daily 180 tablet 1  ? cholecalciferol (VITAMIN D) 1000 UNITS tablet Take 1,000 Units by mouth every evening.     ? ferrous sulfate 325 (65 FE) MG tablet Take 325 mg by mouth daily at 12 noon.    ? flecainide (TAMBOCOR) 100 MG tablet Take 1 tablet (100 mg total) by mouth every evening. 90 tablet 1  ? flecainide (TAMBOCOR) 50 MG tablet TAKE 1 TABLET BY MOUTH ONCE DAILY IN THE MORNING 90 tablet 1  ? metoprolol succinate (TOPROL-XL) 50 MG 24 hr tablet Take 1 tablet by mouth once daily 90 tablet 1  ? Multiple Vitamin (MULTIVITAMIN WITH MINERALS) TABS tablet Take 1 tablet by mouth daily at 12 noon.    ? Multiple Vitamins-Minerals (PRESERVISION AREDS 2 PO) Take 1 tablet by mouth every evening.    ? NIFEdipine (PROCARDIA XL/NIFEDICAL XL) 60 MG 24 hr tablet Take 1 tablet by mouth once daily 90 tablet 1  ? omeprazole (PRILOSEC) 40 MG capsule Take 1 capsule (40 mg total) by mouth 2 (two) times daily before a meal. 180 capsule 3  ? simvastatin (ZOCOR) 40 MG tablet TAKE 1 TABLET BY MOUTH AT BEDTIME 90 tablet 1  ? ?No current facility-administered  medications for this visit.  ? ? ?Allergies ?Allergies  ?Allergen Reactions  ? Cucumber Extract Nausea And Vomiting  ? Sulfonamide Derivatives Other (See Comments)  ?  Unknown reaction  ? ? ?Histories ?Past Medical History:  ?Diagnosis Date  ? Arthritis   ? one finger  ? BPH (benign prostatic hypertrophy)   ? f/u @ Biopsy, s/p bx 2009 apro (-)  ? Cataract   ? Colon polyp   ? Complication of anesthesia   ? violent upon waking  up (only 1 time)   ? Diverticulosis   ? Dysrhythmia   ? A-flutter/Afib  ? Early cataract   ? left  ? Edema leg   ? asymetric , L > R , [place] neg for DVT 09-2010  ? Gastric polyps   ? GERD (gastroesophageal reflux disease)   ? Hemorrhoids   ? History of kidney stones   ? Hypercalcemia   ? parathyroidectomy 04/2008  ? Hyperlipidemia   ? Hypertension   ? Iron deficiency anemia   ? Polyp, stomach   ? Pre-diabetes   ? Wears dentures   ? Wears glasses   ? ?Past Surgical History:  ?Procedure Laterality Date  ? A-FLUTTER ABLATION N/A 08/12/2017  ? Procedure: A-FLUTTER ABLATION;  Surgeon: Deboraha Sprang, MD;  Location: [place] [facility];  Service: Cardiovascular;  Laterality: N/A;  ? APPENDECTOMY    ? BIOPSY  09/20/2018  ? Procedure: BIOPSY;  Surgeon: Irving Copas., MD;  Location: [place];  Service: Gastroenterology;;  ? CATARACT EXTRACTION Right   ? COLONOSCOPY  12/16/2011  ? Procedure: COLONOSCOPY;  Surgeon: Inda Castle, MD;  [facility];  Service: Endoscopy;  Laterality: N/A;  ? COLONOSCOPY WITH PROPOFOL N/A 07/19/2020  ? Procedure: COLONOSCOPY WITH PROPOFOL;  Surgeon: Jerene Bears, MD;  Location: [facility];  Service: Gastroenterology;  Laterality: N/A;  ? ENDOSCOPIC MUCOSAL RESECTION N/A 09/20/2018  ? Procedure: ENDOSCOPIC MUCOSAL RESECTION;  Surgeon: Rush Landmark Telford Nab., MD;  Location: [place];  Service: Gastroenterology;  Laterality: N/A;  ? ENDOSCOPIC MUCOSAL RESECTION  01/31/2019  ? Procedure: ENDOSCOPIC MUCOSAL RESECTION;  Surgeon: Rush Landmark Telford Nab., MD;  Location: [facility];  Service: Gastroenterology;;  ? ESOPHAGOGASTRODUODENOSCOPY N/A 01/17/2013  ? Procedure: ESOPHAGOGASTRODUODENOSCOPY (EGD);  Surgeon: Inda Castle, MD;  Location: [facility];  Service: Endoscopy;  Laterality: N/A;  ? ESOPHAGOGASTRODUODENOSCOPY (EGD) WITH PROPOFOL N/A 09/20/2018  ? Procedure: ESOPHAGOGASTRODUODENOSCOPY (EGD) WITH PROPOFOL;  Surgeon: Rush Landmark Telford Nab., MD;  Location: [place];  Service: Gastroenterology;  Laterality: N/A;  ? ESOPHAGOGASTRODUODENOSCOPY (EGD) WITH PROPOFOL N/A 01/31/2019  ? Procedure: ESOPHAGOGASTRODUODENOSCOPY (EGD) WITH PROPOFOL;  Surgeon: Rush Landmark Telford Nab., MD;  Location: [place];  Service: Gastroenterology;  Laterality: N/A;  ? ESOPHAGOGASTRODUODENOSCOPY (EGD) WITH PROPOFOL N/A 04/04/2020  ? Procedure: ESOPHAGOGASTRODUODENOSCOPY (EGD) WITH PROPOFOL;  Surgeon: Rush Landmark Telford Nab., MD;  Location: [facility];  Service: Gastroenterology;  Laterality: N/A;  ? EUS N/A 01/31/2019  ? Procedure: UPPER ENDOSCOPIC ULTRASOUND (EUS) RADIAL;  Surgeon: Rush Landmark Telford Nab., MD;  Location: [place];  Service: Gastroenterology;  Laterality: N/A;  ? EUS N/A 04/04/2020  ? Procedure: UPPER ENDOSCOPIC ULTRASOUND (EUS) RADIAL;  Surgeon: Rush Landmark Telford Nab., MD;  Location: [facility];  Service: Gastroenterology;  Laterality: N/A;  ? EXTERNAL EAR SURGERY  01/2016  ? BCC removal  ? EYE SURGERY  infant  ? "lazy eye"  ? FINE NEEDLE ASPIRATION  01/31/2019  ? Procedure: FINE NEEDLE ASPIRATION (FNA) LINEAR;  Surgeon: Irving Copas., MD;  Location: [place];  Service: Gastroenterology;;  ?  FLEXIBLE SIGMOIDOSCOPY  02/09/2012  ? Procedure: FLEXIBLE SIGMOIDOSCOPY;  Surgeon: Inda Castle, MD;  Location: [facility];  Service: Endoscopy;  Laterality: N/A;  ? HEMORRHOID BANDING  02/09/2012  ? Procedure: HEMORRHOID BANDING;  Surgeon: Inda Castle, MD;  Location: [facility];  Service: Endoscopy;  Laterality: N/A;  ? HEMOSTASIS CLIP PLACEMENT  09/20/2018  ? Procedure: HEMOSTASIS CLIP PLACEMENT;  Surgeon: Irving Copas., MD;  Location: [place];  Service: Gastroenterology;;  ? HEMOSTASIS CLIP PLACEMENT  01/31/2019  ? Procedure: HEMOSTASIS CLIP PLACEMENT;  Surgeon: Irving Copas., MD;  Location: [place];  Service: Gastroenterology;;  ? HEMOSTASIS CLIP PLACEMENT  04/04/2020  ? Procedure: HEMOSTASIS CLIP PLACEMENT;  Surgeon:  Irving Copas., MD;  Location: [facility];  Service: Gastroenterology;;  ? HEMOSTASIS CONTROL  04/04/2020  ? Procedure: HEMOSTASIS CONTROL;  Surgeon: Rush Landmark Telford Nab., MD;  Location: [facility];  Service:

## 2021-05-23 ENCOUNTER — Telehealth: Payer: Self-pay

## 2021-05-23 ENCOUNTER — Encounter: Payer: Self-pay | Admitting: Gastroenterology

## 2021-05-23 NOTE — Telephone Encounter (Signed)
Pharmacy, can you please comment on how long Eliquis can be held for upcoming colonoscopy?  Thank you! 

## 2021-05-23 NOTE — Telephone Encounter (Signed)
Request for surgical clearance:     Endoscopy Procedure ? ?What type of surgery is being performed?     Colonoscopy  ? ?When is this surgery scheduled?     06/05/2021 ? ?What type of clearance is required ?   Pharmacy ? ?Are there any medications that need to be held prior to surgery and how long? Eliquis x2 days  ? ?Practice name and name of physician performing surgery?      [facility] ? ?What is your office phone and fax number?      Phone- [phone]  Fax- [phone] ? ?Anesthesia type (None, local, MAC, general) ?       MAC ? ?

## 2021-05-24 NOTE — Telephone Encounter (Signed)
? ?  Primary Cardiologist: [name], MD ? ?Chart reviewed as part of pre-operative protocol coverage. Given past medical history and time since last visit, based on ACC/AHA guidelines, Ronald [name] would be at acceptable risk for the planned procedure without further cardiovascular testing.  ? ?Patient with diagnosis of A Fib on Eliquis for anticoagulation.   ?  ?Procedure: colonoscopy ?Date of procedure: 06/05/21 ?  ?  ?CHA2DS2-VASc Score = 4  ?This indicates a 4.8% annual risk of stroke. ?The patient's score is based upon: ?CHF History: 0 ?HTN History: 1 ?Diabetes History: 1 ?Stroke History: 0 ?Vascular Disease History: 0 ?Age Score: 2 ?Gender Score: 0 ?  ?CrCl 58 mL/min ?Platelet count 180K ?  ?  ?Per office protocol, patient can hold Eliquis for 2 days prior to procedure. ? ?I will route this recommendation to the requesting party via Epic fax function and remove from pre-op pool. ? ?Please call with questions. ? ?Ronald [name]. Ronald Isaacks NP-C ? ?  ?05/24/2021, 9:27 AM ?[place] ?[address] ?Office [phone] Fax [phone] ? ? ? ? ?

## 2021-05-24 NOTE — Telephone Encounter (Signed)
Patient with diagnosis of A Fib on Eliquis for anticoagulation.   ? ?Procedure: colonoscopy ?Date of procedure: 06/05/21 ? ? ?CHA2DS2-VASc Score = 4  ?This indicates a 4.8% annual risk of stroke. ?The patient's score is based upon: ?CHF History: 0 ?HTN History: 1 ?Diabetes History: 1 ?Stroke History: 0 ?Vascular Disease History: 0 ?Age Score: 2 ?Gender Score: 0 ?  ?CrCl 58 mL/min ?Platelet count 180K ? ? ?Per office protocol, patient can hold Eliquis for 2 days prior to procedure.   ?

## 2021-05-24 NOTE — Telephone Encounter (Signed)
Pt has been informed okay to hold Eliquis x2 days prior to procedure. Pt voiced understanding.  ?

## 2021-05-25 DIAGNOSIS — Z862 Personal history of diseases of the blood and blood-forming organs and certain disorders involving the immune mechanism: Secondary | ICD-10-CM | POA: Insufficient documentation

## 2021-05-25 DIAGNOSIS — K317 Polyp of stomach and duodenum: Secondary | ICD-10-CM | POA: Insufficient documentation

## 2021-05-25 DIAGNOSIS — Z8509 Personal history of malignant neoplasm of other digestive organs: Secondary | ICD-10-CM | POA: Insufficient documentation

## 2021-05-25 DIAGNOSIS — K862 Cyst of pancreas: Secondary | ICD-10-CM | POA: Insufficient documentation

## 2021-05-25 DIAGNOSIS — R933 Abnormal findings on diagnostic imaging of other parts of digestive tract: Secondary | ICD-10-CM | POA: Insufficient documentation

## 2021-05-27 ENCOUNTER — Other Ambulatory Visit: Payer: Self-pay | Admitting: Internal Medicine

## 2021-05-28 ENCOUNTER — Encounter (HOSPITAL_COMMUNITY): Payer: Self-pay | Admitting: Gastroenterology

## 2021-05-31 ENCOUNTER — Encounter: Payer: Self-pay | Admitting: Internal Medicine

## 2021-05-31 ENCOUNTER — Other Ambulatory Visit: Payer: Self-pay

## 2021-05-31 DIAGNOSIS — I10 Essential (primary) hypertension: Secondary | ICD-10-CM

## 2021-05-31 MED ORDER — NIFEDIPINE ER OSMOTIC RELEASE 60 MG PO TB24: 60.0000 mg | ORAL_TABLET | Freq: Every day | ORAL | 1 refills | Status: AC

## 2021-06-05 ENCOUNTER — Encounter (HOSPITAL_COMMUNITY): Payer: Self-pay | Admitting: Gastroenterology

## 2021-06-05 ENCOUNTER — Other Ambulatory Visit: Payer: Self-pay

## 2021-06-05 ENCOUNTER — Ambulatory Visit (HOSPITAL_BASED_OUTPATIENT_CLINIC_OR_DEPARTMENT_OTHER): Payer: PPO | Admitting: Anesthesiology

## 2021-06-05 ENCOUNTER — Encounter (HOSPITAL_COMMUNITY)
Admission: RE | Disposition: A | Discharge status: Home / Self Care | Payer: Self-pay | Source: Ambulatory Visit | Attending: Gastroenterology

## 2021-06-05 ENCOUNTER — Telehealth: Payer: Self-pay | Admitting: Gastroenterology

## 2021-06-05 ENCOUNTER — Ambulatory Visit (HOSPITAL_COMMUNITY): Payer: PPO | Admitting: Anesthesiology

## 2021-06-05 ENCOUNTER — Ambulatory Visit (HOSPITAL_COMMUNITY)
Admission: RE | Admit: 2021-06-05 | Discharge: 2021-06-05 | Disposition: A | Discharge status: Home / Self Care | Payer: PPO | Source: Ambulatory Visit | Attending: Gastroenterology | Admitting: Gastroenterology

## 2021-06-05 DIAGNOSIS — Z87891 Personal history of nicotine dependence: Secondary | ICD-10-CM | POA: Diagnosis not present

## 2021-06-05 DIAGNOSIS — C49A0 Gastrointestinal stromal tumor, unspecified site: Secondary | ICD-10-CM | POA: Diagnosis not present

## 2021-06-05 DIAGNOSIS — K317 Polyp of stomach and duodenum: Secondary | ICD-10-CM

## 2021-06-05 DIAGNOSIS — K2900 Acute gastritis without bleeding: Secondary | ICD-10-CM | POA: Diagnosis not present

## 2021-06-05 DIAGNOSIS — N400 Enlarged prostate without lower urinary tract symptoms: Secondary | ICD-10-CM | POA: Diagnosis not present

## 2021-06-05 DIAGNOSIS — K219 Gastro-esophageal reflux disease without esophagitis: Secondary | ICD-10-CM | POA: Diagnosis not present

## 2021-06-05 DIAGNOSIS — E119 Type 2 diabetes mellitus without complications: Secondary | ICD-10-CM | POA: Insufficient documentation

## 2021-06-05 DIAGNOSIS — K8020 Calculus of gallbladder without cholecystitis without obstruction: Secondary | ICD-10-CM

## 2021-06-05 DIAGNOSIS — Z86010 Personal history of colonic polyps: Secondary | ICD-10-CM | POA: Insufficient documentation

## 2021-06-05 DIAGNOSIS — K862 Cyst of pancreas: Secondary | ICD-10-CM

## 2021-06-05 DIAGNOSIS — K2289 Other specified disease of esophagus: Secondary | ICD-10-CM | POA: Diagnosis not present

## 2021-06-05 DIAGNOSIS — E785 Hyperlipidemia, unspecified: Secondary | ICD-10-CM | POA: Insufficient documentation

## 2021-06-05 DIAGNOSIS — Z0489 Encounter for examination and observation for other specified reasons: Secondary | ICD-10-CM | POA: Insufficient documentation

## 2021-06-05 DIAGNOSIS — E892 Postprocedural hypoparathyroidism: Secondary | ICD-10-CM | POA: Insufficient documentation

## 2021-06-05 DIAGNOSIS — I4891 Unspecified atrial fibrillation: Secondary | ICD-10-CM | POA: Diagnosis not present

## 2021-06-05 DIAGNOSIS — K3189 Other diseases of stomach and duodenum: Secondary | ICD-10-CM | POA: Diagnosis not present

## 2021-06-05 DIAGNOSIS — I10 Essential (primary) hypertension: Secondary | ICD-10-CM | POA: Diagnosis not present

## 2021-06-05 DIAGNOSIS — D490 Neoplasm of unspecified behavior of digestive system: Secondary | ICD-10-CM

## 2021-06-05 DIAGNOSIS — R933 Abnormal findings on diagnostic imaging of other parts of digestive tract: Secondary | ICD-10-CM

## 2021-06-05 DIAGNOSIS — Z8509 Personal history of malignant neoplasm of other digestive organs: Secondary | ICD-10-CM

## 2021-06-05 HISTORY — PX: UPPER ESOPHAGEAL ENDOSCOPIC ULTRASOUND (EUS): SHX6562

## 2021-06-05 HISTORY — PX: HEMOSTASIS CLIP PLACEMENT: SHX6857

## 2021-06-05 HISTORY — PX: ENDOSCOPIC MUCOSAL RESECTION: SHX6839

## 2021-06-05 HISTORY — PX: SCLEROTHERAPY: SHX6841

## 2021-06-05 HISTORY — PX: ESOPHAGOGASTRODUODENOSCOPY (EGD) WITH PROPOFOL: SHX5813

## 2021-06-05 SURGERY — UPPER ESOPHAGEAL ENDOSCOPIC ULTRASOUND (EUS)
Anesthesia: Monitor Anesthesia Care

## 2021-06-05 MED ORDER — LACTATED RINGERS IV SOLN: INTRAVENOUS | Status: DC

## 2021-06-05 MED ORDER — PROPOFOL 1000 MG/100ML IV EMUL
INTRAVENOUS | Status: AC
  Filled 2021-06-05: qty 100

## 2021-06-05 MED ORDER — PROPOFOL 10 MG/ML IV BOLUS
INTRAVENOUS | Status: DC | PRN
  Administered 2021-06-05 (×2): 10 mg via INTRAVENOUS

## 2021-06-05 MED ORDER — SUCRALFATE 1 G PO TABS: ORAL_TABLET | ORAL | 2 refills | Status: AC

## 2021-06-05 MED ORDER — APIXABAN 5 MG PO TABS: 5.0000 mg | ORAL_TABLET | Freq: Two times a day (BID) | ORAL | 1 refills | Status: AC

## 2021-06-05 MED ORDER — PROPOFOL 500 MG/50ML IV EMUL
INTRAVENOUS | Status: DC | PRN
  Administered 2021-06-05 (×2): 100 ug/kg/min via INTRAVENOUS

## 2021-06-05 MED ORDER — SUCRALFATE 1 GM/10ML PO SUSP: 1.0000 g | Freq: Two times a day (BID) | ORAL | 1 refills | Status: DC

## 2021-06-05 MED ORDER — EPHEDRINE SULFATE (PRESSORS) 50 MG/ML IJ SOLN
INTRAMUSCULAR | Status: DC | PRN
  Administered 2021-06-05 (×3): 5 mg via INTRAVENOUS

## 2021-06-05 MED ORDER — LIDOCAINE HCL 1 % IJ SOLN
INTRAMUSCULAR | Status: DC | PRN
  Administered 2021-06-05: 50 mg via INTRADERMAL
  Administered 2021-06-05: 30 mg via INTRADERMAL

## 2021-06-05 MED ORDER — SODIUM CHLORIDE 0.9 % IV SOLN: INTRAVENOUS | Status: DC

## 2021-06-05 MED ORDER — SODIUM CHLORIDE (PF) 0.9 % IJ SOLN
PREFILLED_SYRINGE | INTRAMUSCULAR | Status: DC | PRN
  Administered 2021-06-05: 1 mL

## 2021-06-05 SURGICAL SUPPLY — 15 items

## 2021-06-05 NOTE — Transfer of Care (Signed)
Immediate Anesthesia Transfer of Care Note ? ?Patient: Carnelius Hammitt [place] ? ?Procedure(s) Performed: UPPER ESOPHAGEAL ENDOSCOPIC ULTRASOUND (EUS) ?ESOPHAGOGASTRODUODENOSCOPY (EGD) WITH PROPOFOL ?ENDOSCOPIC MUCOSAL RESECTION ?SCLEROTHERAPY ?HEMOSTASIS CLIP PLACEMENT ? ?Patient Location: PACU and Endoscopy Unit ? ?Anesthesia Type:MAC ? ?Level of Consciousness: oriented, drowsy and patient cooperative ? ?Airway & Oxygen Therapy: Patient Spontanous Breathing and Patient connected to face mask oxygen ? ?Post-op Assessment: Report given to RN and Post -op Vital signs reviewed and stable ? ?Post vital signs: Reviewed and stable ? ?Last Vitals:  ?Vitals Value Taken Time  ?BP 120/57 06/05/21 1240  ?Temp 36.6 ?C 06/05/21 1239  ?Pulse 57 06/05/21 1243  ?Resp 18 06/05/21 1243  ?SpO2 100 % 06/05/21 1243  ?Vitals shown include unvalidated device data. ? ?Last Pain:  ?Vitals:  ? 06/05/21 1239  ?TempSrc: Temporal  ?PainSc: Asleep  ?   ? ?  ? ?Complications: No notable events documented. ?

## 2021-06-05 NOTE — Telephone Encounter (Signed)
Called and spoke with patient informed him that prescription for Carafate has been changed to pill form with instructions to make slurry. Pt voiced understanding.  ?

## 2021-06-05 NOTE — Anesthesia Preprocedure Evaluation (Signed)
Anesthesia Evaluation  ?Patient identified by MRN, date of birth, ID band ?Patient awake ? ? ? ?Reviewed: ?Allergy & Precautions, NPO status , Patient's Chart, lab work & pertinent test results, reviewed documented beta blocker date and time  ? ?History of Anesthesia Complications ?Negative for: history of anesthetic complications ? ?Airway ?Mallampati: III ? ?TM Distance: >3 FB ?Neck ROM: Full ? ? ? Dental ? ?(+) Partial Lower, Edentulous Upper ?  ?Pulmonary ?neg pulmonary ROS, former smoker,  ?  ?Pulmonary exam normal ? ? ? ? ? ? ? Cardiovascular ?hypertension, Pt. on medications and Pt. on home beta blockers ?Normal cardiovascular exam+ dysrhythmias Atrial Fibrillation  ? ? ?Echo 02/11/20: EF 60-65%, no RWMA, normal RVSF, mild LAE/RAE, mild AR ?  ?Neuro/Psych ?negative neurological ROS ?   ? GI/Hepatic ?Neg liver ROS, GERD  ,Gastric GIST ?  ?Endo/Other  ?diabetes, Type 2 ? Renal/GU ?negative Renal ROS  ?negative genitourinary ?  ?Musculoskeletal ?negative musculoskeletal ROS ?(+)  ? Abdominal ?  ?Peds ? Hematology ?Eliquis   ?Anesthesia Other Findings ? ? Reproductive/Obstetrics ? ?  ? ? ? ? ? ? ? ? ? ? ? ? ? ?  ?  ? ? ? ? ? ? ?Anesthesia Physical ?Anesthesia Plan ? ?ASA: 3 ? ?Anesthesia Plan: MAC  ? ?Post-op Pain Management: Minimal or no pain anticipated  ? ?Induction: Intravenous ? ?PONV Risk Score and Plan: 1 and Propofol infusion, TIVA and Treatment may vary due to age or medical condition ? ?Airway Management Planned: Natural Airway, Nasal Cannula and Simple Face Mask ? ?Additional Equipment: None ? ?Intra-op Plan:  ? ?Post-operative Plan:  ? ?Informed Consent: I have reviewed the patients History and Physical, chart, labs and discussed the procedure including the risks, benefits and alternatives for the proposed anesthesia with the patient or authorized representative who has indicated his/her understanding and acceptance.  ? ? ? ? ? ?Plan Discussed with:  ? ?Anesthesia  Plan Comments:   ? ? ? ? ? ? ?Anesthesia Quick Evaluation ? ?

## 2021-06-05 NOTE — Interval H&P Note (Signed)
History and Physical Interval Note: ? ?06/05/2021 ?11:24 AM ? ?Latricia Heft Mayorquin  has presented today for surgery, with the diagnosis of Gastric GIST.  The various methods of treatment have been discussed with the patient and family. After consideration of risks, benefits and other options for treatment, the patient has consented to  Procedure(s): ?UPPER ESOPHAGEAL ENDOSCOPIC ULTRASOUND (EUS) (N/A) ?ESOPHAGOGASTRODUODENOSCOPY (EGD) WITH PROPOFOL (N/A) as a surgical intervention.  The patient's history has been reviewed, patient examined, no change in status, stable for surgery.  I have reviewed the patient's chart and labs.  Questions were answered to the patient's satisfaction.   ? ?The risks of an EUS including intestinal perforation, bleeding, infection, aspiration, and medication effects were discussed as was the possibility it may not give a definitive diagnosis if a biopsy is performed.  When a biopsy of the pancreas is done as part of the EUS, there is an additional risk of pancreatitis at the rate of about 1-2%.  It was explained that procedure related pancreatitis is typically mild, although it can be severe and even life threatening, which is why we do not perform random pancreatic biopsies and only biopsy a lesion/area we feel is concerning enough to warrant the risk. ? ? ? ?Irving Copas ? ? ?

## 2021-06-05 NOTE — Discharge Instructions (Signed)
YOU HAD AN ENDOSCOPIC PROCEDURE TODAY: Refer to the procedure report and other information in the discharge instructions given to you for any specific questions about what was found during the examination. If this information does not answer your questions, please call [facility] office at [phone] to clarify.  ° °YOU SHOULD EXPECT: Some feelings of bloating in the abdomen. Passage of more gas than usual. Walking can help get rid of the air that was put into your GI tract during the procedure and reduce the bloating. If you had a lower endoscopy (such as a colonoscopy or flexible sigmoidoscopy) you may notice spotting of blood in your stool or on the toilet paper. Some abdominal soreness may be present for a day or two, also. ° °DIET: Your first meal following the procedure should be a light meal and then it is ok to progress to your normal diet. A half-sandwich or bowl of soup is an example of a good first meal. Heavy or fried foods are harder to digest and may make you feel nauseous or bloated. Drink plenty of fluids but you should avoid alcoholic beverages for 24 hours. If you had a esophageal dilation, please see attached instructions for diet.   ° °ACTIVITY: Your care partner should take you home directly after the procedure. You should plan to take it easy, moving slowly for the rest of the day. You can resume normal activity the day after the procedure however YOU SHOULD NOT DRIVE, use power tools, machinery or perform tasks that involve climbing or major physical exertion for 24 hours (because of the sedation medicines used during the test).  ° °SYMPTOMS TO REPORT IMMEDIATELY: °A gastroenterologist can be reached at any hour. Please call [phone]  for any of the following symptoms:  °Following lower endoscopy (colonoscopy, flexible sigmoidoscopy) °Excessive amounts of blood in the stool  °Significant tenderness, worsening of abdominal pains  °Swelling of the abdomen that is new, acute  °Fever of 100° or  higher  °Following upper endoscopy (EGD, EUS, ERCP, esophageal dilation) °Vomiting of blood or coffee ground material  °New, significant abdominal pain  °New, significant chest pain or pain under the shoulder blades  °Painful or persistently difficult swallowing  °New shortness of breath  °Black, tarry-looking or red, bloody stools ° °FOLLOW UP:  °If any biopsies were taken you will be contacted by phone or by letter within the next 1-3 weeks. Call [phone]  if you have not heard about the biopsies in 3 weeks.  °Please also call with any specific questions about appointments or follow up tests. ° °

## 2021-06-05 NOTE — Op Note (Addendum)
[facility] ?Patient Name: Ronald [name] ?Procedure Date: 06/05/2021 ?MRN: [id number] ?Attending MD: Justice Britain , MD ?Date of Birth: 09-10-1934 ?CSN: [id number] ?Age: 87 ?Admit Type: Outpatient ?Procedure:                Upper EUS ?Indications:              Submucosal tumor - previously diagnosed GIST but  ?                          only doing surveillance, History of Iron deficiency  ?                          anemia, Suspected history of previous upper  ?                          gastrointestinal bleeding in patient with chronic  ?                          blood loss (thankfully no longer anemic) due to  ?                          gastric hyperplastic polyps that we have resected  ?                          previously ?Providers:                Justice Britain, MD, Jeanella Cara, RN,  ?                          Tyna Jaksch Technician ?Referring MD:             Izola Price. Blaine Hamper MD, MD ?Medicines:                Monitored Anesthesia Care ?Complications:            No immediate complications. ?Estimated Blood Loss:     Estimated blood loss was minimal. ?Procedure:                Pre-Anesthesia Assessment: ?                          - Prior to the procedure, a History and Physical  ?                          was performed, and patient medications and  ?                          allergies were reviewed. The patient's tolerance of  ?                          previous anesthesia was also reviewed. The risks  ?                          and benefits of the procedure and the sedation  ?  options and risks were discussed with the patient.  ?                          All questions were answered, and informed consent  ?                          was obtained. Prior Anticoagulants: The patient has  ?                          taken Eliquis (apixaban), last dose was 3 days  ?                          prior to procedure. ASA Grade Assessment: III - A  ?                           patient with severe systemic disease. After  ?                          reviewing the risks and benefits, the patient was  ?                          deemed in satisfactory condition to undergo the  ?                          procedure. ?                          After obtaining informed consent, the endoscope was  ?                          passed under direct vision. Throughout the  ?                          procedure, the patient's blood pressure, pulse, and  ?                          oxygen saturations were monitored continuously. The  ?                          [id number] ([id number]) Olympus therapeutic endoscope  ?                          was introduced through the mouth, and advanced to  ?                          the second part of duodenum. The [id number]-[id number]  ?                          ([id number]) Olympus radial ultrasound scope was  ?                          introduced through the mouth, and advanced to the  ?  stomach for ultrasound examination. The [id number]  ?                          ([id number]) Olympus linear ultrasound scope was  ?                          introduced through the mouth, and advanced to the  ?                          duodenum for ultrasound examination from the  ?                          stomach and duodenum. After obtaining informed  ?                          consent, the endoscope was passed under direct  ?                          vision. Throughout the procedure, the patient's  ?                          blood pressure, pulse, and oxygen saturations were  ?                          monitored continuously.The upper EUS was  ?                          accomplished without difficulty. The patient  ?                          tolerated the procedure. ?Scope In: ?Scope Out: ?Findings: ?     ENDOSCOPIC FINDING: : ?     No gross lesions were noted in the entire esophagus. ?     The Z-line was irregular and was found 37 cm from the incisors. ?      Greater than 30, 5 to 30 mm pedunculated and sessile polyps with no  ?     bleeding and stigmata of recent bleeding were found in the entire  ?     examined stomach. Two of the lesions were selected for resection due to  ?     concern for increased risk of bleeding. Preparations were made for  ?     mucosal resection. A 1:100,000 solution of epinephrine was injected to  ?     raise the lesions (0.5 mL and 1.0 mL). Snare mucosal resection was  ?     performed. Resection and retrieval were complete. To prevent bleeding  ?     after mucosal resection, four hemostatic clips were successfully placed  ?     (MR conditional) (3 on 1 lesion and 1 on the other lesion). There was no  ?     bleeding during, or at the end, of the procedure. ?     A medium-sized, submucosal, non-circumferential mass with no bleeding  ?     and no stigmata of recent bleeding was found in the gastric antrum. ?     No gross lesions were noted in the duodenal bulb, in the first portion  ?  of the duodenum and in the second portion of the duodenum. ?     ENDOSONOGRAPHIC FINDING: : ?     A round intramural (subepithelial) lesion was found in the antrum of the  ?     stomach. The lesion was hypoechoic. Sonographically, the lesion appeared  ?     to originate from the muscularis propria (Layer 4). The lesion measured  ?     19 mm (in maximum thickness). The lesion also measured 13 mm in  ?     diameter. The outer endosonographic borders were well defined. ?     Anechoic lesions suggestive of three cysts were identified in the genu  ?     of the pancreas and pancreatic tail. The first cyst in the tail measured  ?     4 mm by 4 mm. The second cyst in the neck of the pancreas measured 3 mm  ?     by 4 mm. The third cyst in the neck of the pancreas measured 3 mm by 3  ?     mm. There was no associated mass. ?     There was no sign of significant endosonographic abnormality in the  ?     pancreatic head (1.6 mm), genu of the pancreas (1.4 mm), pancreatic  body  ?     (1.1 mm) and pancreatic tail (1.1 mm). No masses, the pancreatic duct  ?     was regular in contour. ?     Many stones were visualized endosonographically in the gallbladder. The  ?     stones were round. They were hyperechoic and characterized by shadowing. ?     There was no sign of significant endosonographic abnormality in the  ?     common bile duct (3.1 mm) and in the common hepatic duct (5.4 mm). ?     Endosonographic imaging of the ampulla showed no mass. ?     Endosonographic imaging in the visualized portion of the liver showed no  ?     mass. ?     No malignant-appearing lymph nodes were visualized in the celiac region  ?     (level 20), peripancreatic region and porta hepatis region. ?     The celiac region was visualized. ?Impression:               EGD Impression: ?                          - No gross lesions in esophagus. Z-line irregular,  ?                          37 cm from the incisors. ?                          - Greater than 30 gastric polyps. 2 were resected  ?                          and retrieved due to concern for increased risk for  ?                          chronic blood loss. Clips (MR conditional) were  ?  placed. ?                          - Gastric tumor in the gastric antrum - consistent  ?                          with previously known GIST. ?                          - No gross lesions in the duodenal bulb, in the  ?                          first portion of the duodenum and in the second  ?                          portion of the duodenum. ?                          EUS Impression: ?                          - An intramural (subepithelial) lesion was found in  ?                          the antrum of the stomach. The lesion appeared to  ?                          originate from within the muscularis propria (Layer  ?                          4). A tissue diagnosis was obtained prior to this  ?                          exam. 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 ?                          - Three cystic lesions were seen in the genu of the  ?                          pancreas and panc

## 2021-06-05 NOTE — Telephone Encounter (Signed)
Inbound call from patient stating that he had a procedure with Dr. Rush Landmark and he sent in a prescription for Carafate and it is going to cost him $700 dollars. Patient is seeking advice if there is anyway he can get the pill instead of liquid. Please advise.   ?

## 2021-06-05 NOTE — Anesthesia Postprocedure Evaluation (Signed)
Anesthesia Post Note ? ?Patient: Ronald [name] ? ?Procedure(s) Performed: UPPER ESOPHAGEAL ENDOSCOPIC ULTRASOUND (EUS) ?ESOPHAGOGASTRODUODENOSCOPY (EGD) WITH PROPOFOL ?ENDOSCOPIC MUCOSAL RESECTION ?SCLEROTHERAPY ?HEMOSTASIS CLIP PLACEMENT ? ?  ? ?Patient location during evaluation: Endoscopy ?Anesthesia Type: MAC ?Level of consciousness: awake and alert ?Pain management: pain level controlled ?Vital Signs Assessment: post-procedure vital signs reviewed and stable ?Respiratory status: spontaneous breathing, nonlabored ventilation and respiratory function stable ?Cardiovascular status: blood pressure returned to baseline and stable ?Postop Assessment: no apparent nausea or vomiting ?Anesthetic complications: no ? ? ?No notable events documented. ? ?Last Vitals:  ?Vitals:  ? 06/05/21 1250 06/05/21 1301  ?BP: (!) 131/55 (!) 125/57  ?Pulse: 61 (!) 53  ?Resp: 17 16  ?Temp:    ?SpO2: 100% 95%  ?  ?Last Pain:  ?Vitals:  ? 06/05/21 1301  ?TempSrc:   ?PainSc: 0-No pain  ? ? ?  ?  ?  ?  ?  ?  ? ?Lidia Collum ? ? ? ? ?

## 2021-06-06 ENCOUNTER — Encounter (HOSPITAL_COMMUNITY): Payer: Self-pay | Admitting: Gastroenterology

## 2021-06-11 LAB — SURGICAL PATHOLOGY

## 2021-06-12 ENCOUNTER — Encounter: Payer: Self-pay | Admitting: Gastroenterology

## 2021-07-08 DIAGNOSIS — I499 Cardiac arrhythmia, unspecified: Secondary | ICD-10-CM | POA: Diagnosis not present

## 2021-07-08 DIAGNOSIS — R0789 Other chest pain: Secondary | ICD-10-CM | POA: Diagnosis not present

## 2021-07-08 DIAGNOSIS — R079 Chest pain, unspecified: Secondary | ICD-10-CM | POA: Diagnosis not present

## 2021-07-08 DIAGNOSIS — R404 Transient alteration of awareness: Secondary | ICD-10-CM | POA: Diagnosis not present

## 2021-07-09 ENCOUNTER — Telehealth: Payer: Self-pay | Admitting: Internal Medicine

## 2021-07-09 NOTE — Telephone Encounter (Signed)
Funeral home wanted to make pcp aware pf pt's passing, and asked if the death certificate could be signed electronically.  ?

## 2021-07-09 NOTE — Telephone Encounter (Signed)
I got a phone call from the funeral home, they request need to sign the death significant. ?The patient was seen by EMS and declared dead 2 Jul 12, 2021 at 8.0 8 AM. ?I called EMS twice to get some information, left a message. ? 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 ? ?The patient started to feel unwell April 29, Saturday, he called Gwen and c/o nausea, jaw pain, chest pressure. ?No abdominal pain, no diarrhea, no blood in the stools. ?He refused to go to the ER. ?Next day Sunday, symptoms were about the same, she went to [facility]  house, he looked pale, ashy and not well. ?Still refused to go to the ER,  his BP was a little high. ?He went to bed not feeling well and early in the morning Gwen went to check on him he was death. ?EMS arrived. ?Based on above,  will sign the death certificate, because of that most likely MI. ? ?

## 2021-07-10 NOTE — Telephone Encounter (Signed)
Death certificate completed online

## 2021-08-08 DIAGNOSIS — 419620001 Death: Secondary | SNOMED CT | POA: Diagnosis not present

## 2021-08-08 DEATH — deceased

## 2021-10-08 IMAGING — CT CT ABDOMEN W/ CM
3 of 5 series · 12 of 46 positions shown, 17 images · IV contrast (OMNIPAQUE)
Comparison: CT scan 11/29/2018

CLINICAL DATA: History of gastric polyps and GIST tumor of the
stomach.

EXAM:
CT ABDOMEN WITH CONTRAST
TECHNIQUE: Multidetector CT imaging of the abdomen was performed using the
standard protocol following bolus administration of intravenous
contrast.
CONTRAST:  100mL OMNIPAQUE IOHEXOL 300 MG/ML  SOLN

[Series 2: axial st · axial · 0.94mm/px · z∈[-319,-79]mm · 8 of 62 slices shown, 13 images]
[im 7/62  soft-tissue]
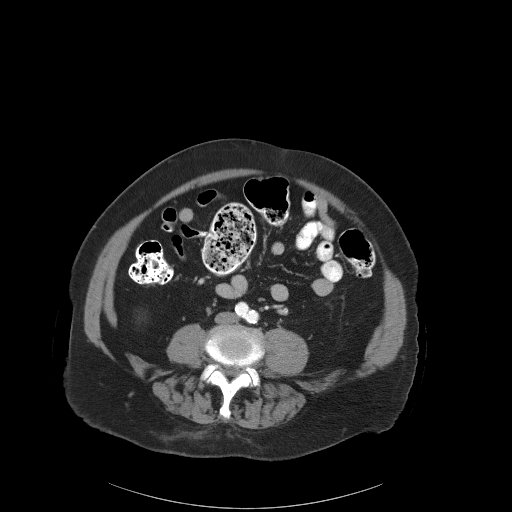
[im 7/62  bone]
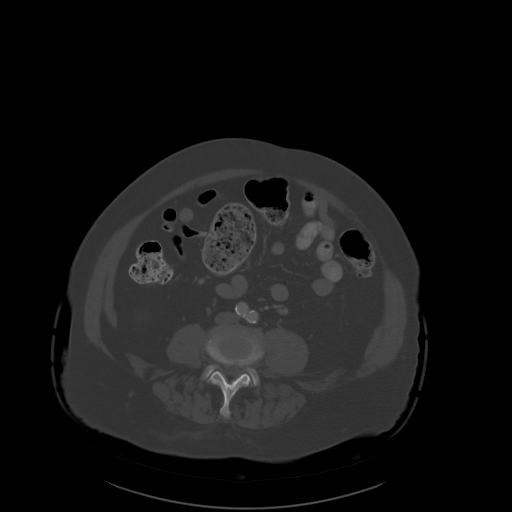
[im 14/62  soft-tissue]
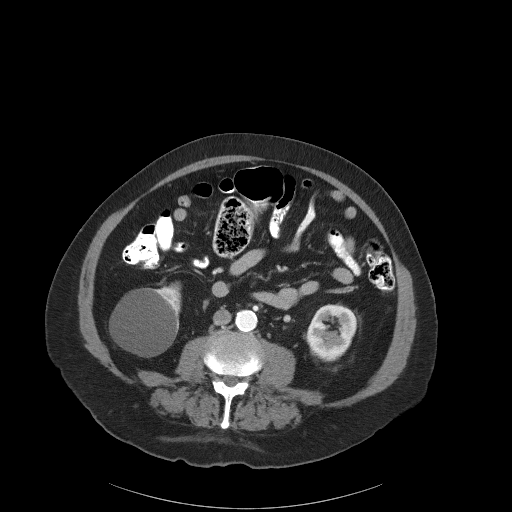
[im 21/62  soft-tissue]
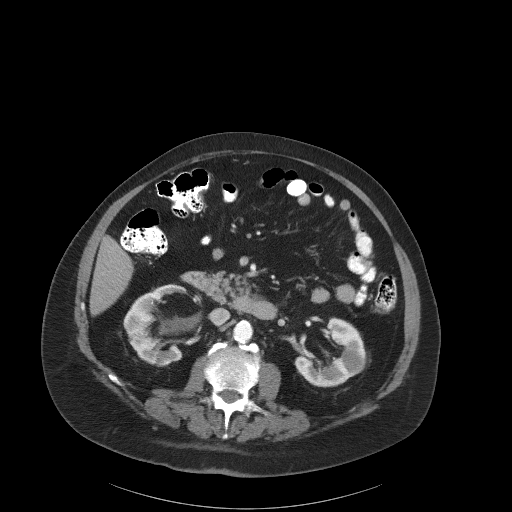
[im 28/62  soft-tissue]
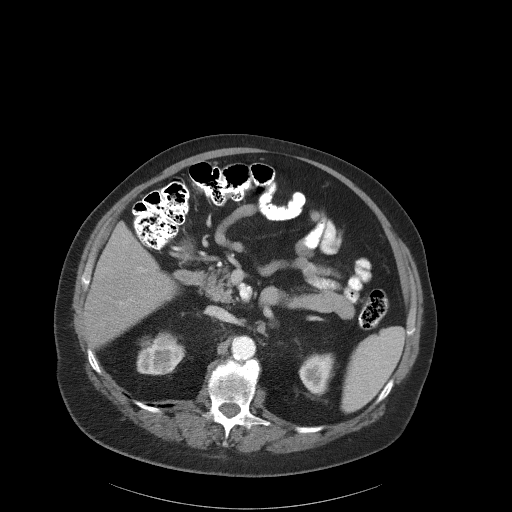
[im 34/62  soft-tissue]
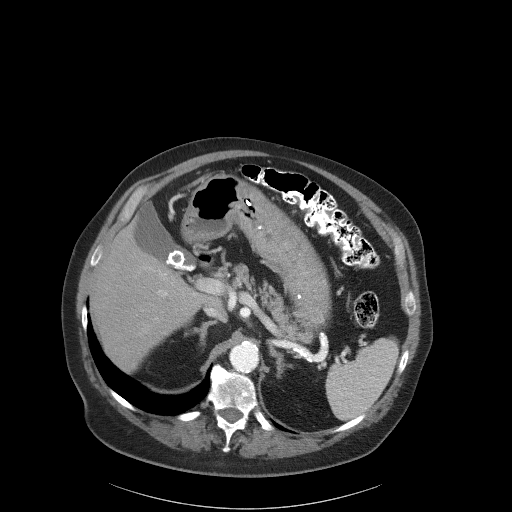
[im 34/62  lung]
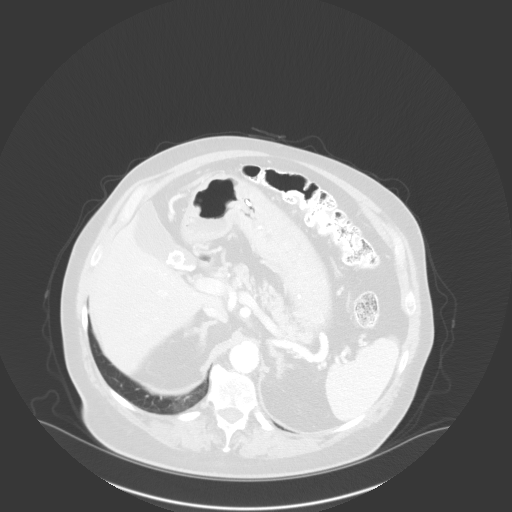
[im 41/62  soft-tissue]
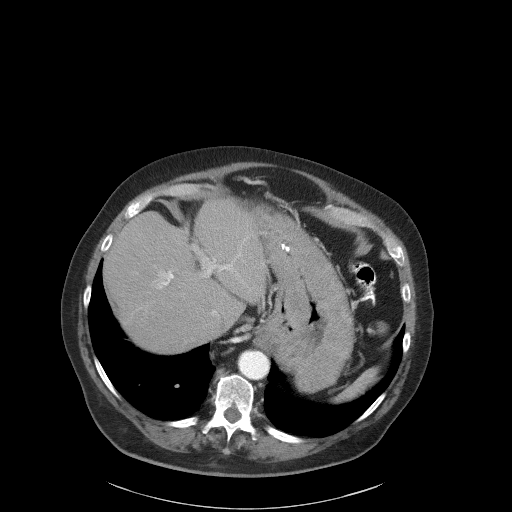
[im 41/62  lung]
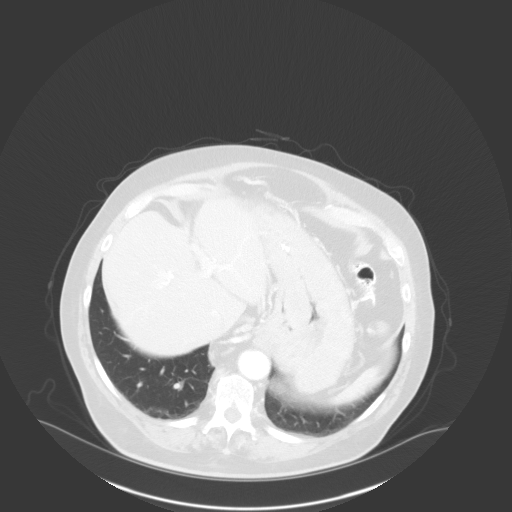
[im 48/62  soft-tissue]
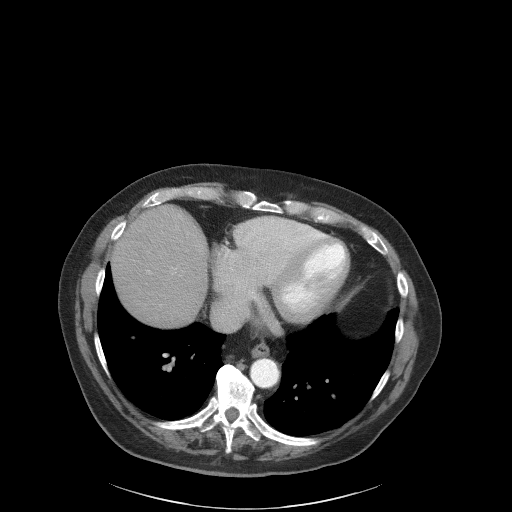
[im 48/62  lung]
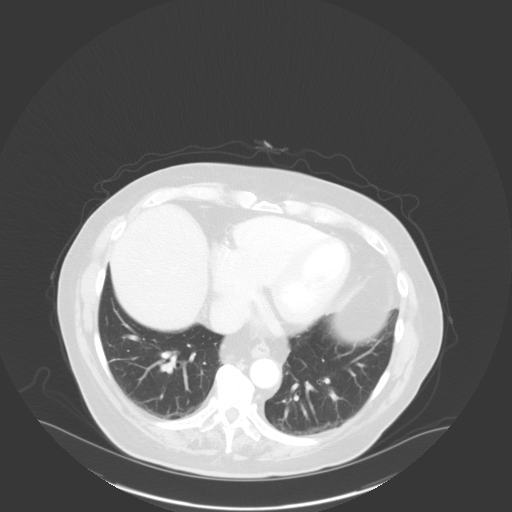
[im 55/62  soft-tissue]
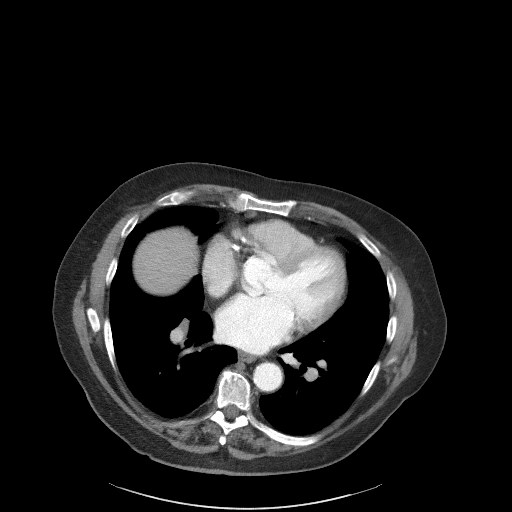
[im 55/62  lung]
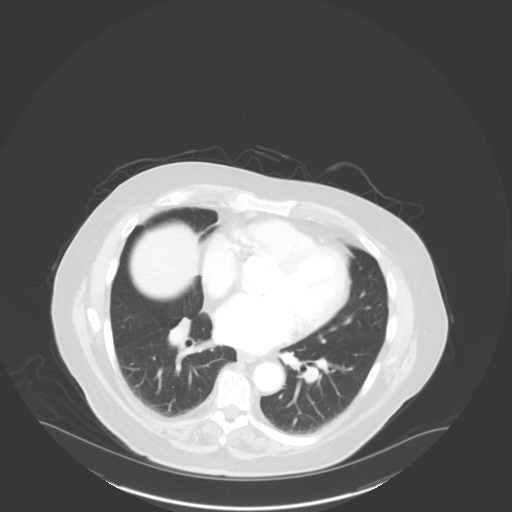

[Series 4: coronal st · coronal · 0.70mm/px · 3 of 100 slices shown]
[im 34/100  soft-tissue]
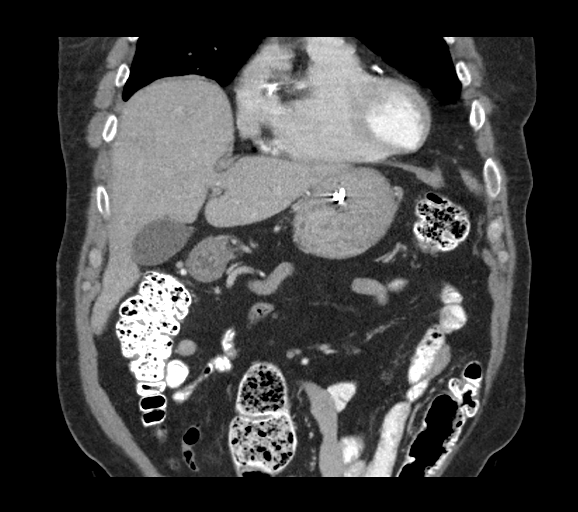
[im 45/100  soft-tissue]
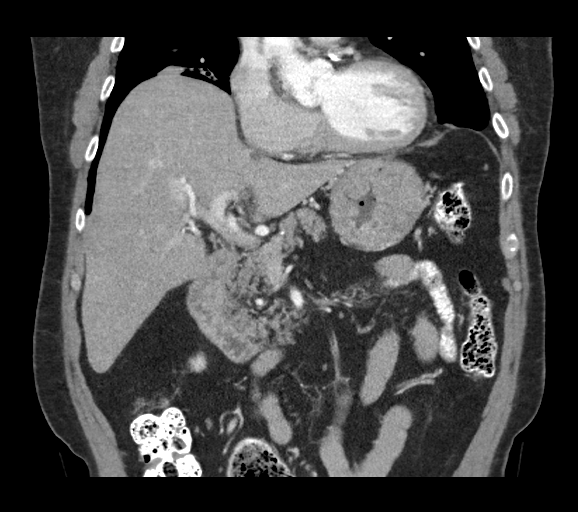
[im 56/100  soft-tissue]
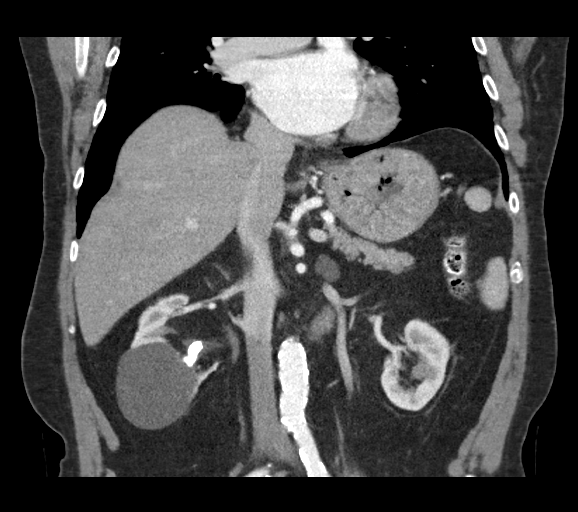

[Series 5: sagittal st · sagittal · 0.65mm/px · 1 of 115 slices shown]
[im 39/115  soft-tissue]
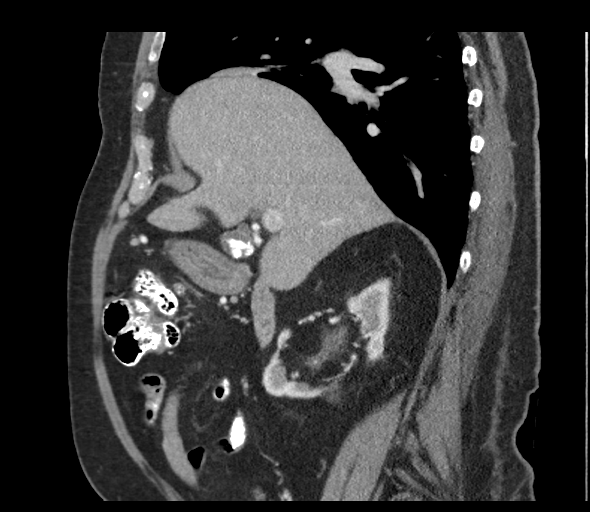

[12 of 46 positions shown; findings below may reference images not displayed]

FINDINGS: Lower chest: The lung bases are clear of an acute process. Mild to
moderate eventration of the right hemidiaphragm with overlying
vascular crowding and atelectasis. No worrisome pulmonary lesions
and no pleural effusion. The heart is normal in size for age. Aortic
and coronary artery calcifications are again noted.

Hepatobiliary: No hepatic lesions or intrahepatic biliary
dilatation. Gallstones are noted the gallbladder but no findings for
acute cholecystitis. No common bile duct dilatation.

Pancreas: No mass, inflammation or ductal dilatation.

Spleen: Normal size.  No focal lesions.

Adrenals/Urinary Tract: Stable 15 mm low-attenuation left adrenal
gland nodule consistent with benign adenoma. The right adrenal gland
is normal.

Renal cortical thinning and scarring changes bilaterally, right
greater than left. There is a large staghorn type calculus noted in
the lower pole region of the right kidney and is also a 6.5 cm
simple appearing lower pole cyst.

Midpole left renal lesion laterally is stable in size and measures
40 Hounsfield units. This is consistent with a benign nonenhancing
hemorrhagic cyst.

Stomach/Bowel: The stomach is poorly distended with contrast. There
are several metallic clips in the stomach from previous biopsy
sites. No gastric mass is identified. The duodenum, visualized small
bowel and visualized colon are unremarkable.

Vascular/Lymphatic: Stable atherosclerotic calcifications involving
the aorta and branch vessels. No focal aneurysm or dissection. The
major venous structures are patent.

No mesenteric or retroperitoneal mass or lymphadenopathy. Small
scattered lymph nodes are stable. Stable 16 mm cystic appearing
lesion in the left retroperitoneum near the left adrenal gland
likely a benign cyst.

Other: No ascites or abdominal wall hernia.

Musculoskeletal: No significant bony findings.
IMPRESSION: 1. Poorly distended but unremarkable CT appearance of the stomach.
No gastric mass is identified. There are several metallic clips in
the stomach from previous biopsy sites.
2. Stable large staghorn type calculus in the lower pole region of
the right kidney and 6.5 cm simple appearing lower pole cyst.
3. Stable benign-appearing hemorrhagic cyst involving the midpole
region of the left kidney.
4. Stable left adrenal gland adenoma.
5. Cholelithiasis.
6. Stable atherosclerotic calcifications involving the aorta and
branch vessels.
7. Aortic atherosclerosis.

Aortic Atherosclerosis ([id number]-YIJ.J).

## 2021-10-28 ENCOUNTER — Ambulatory Visit: Payer: PPO | Admitting: Internal Medicine

## 2021-11-12 ENCOUNTER — Ambulatory Visit: Payer: PPO | Admitting: Internal Medicine
